# Patient Record
Sex: Male | Born: 1960 | Race: White | Hispanic: No | Marital: Married | Smoking: Former smoker
Health system: Southern US, Community
[De-identification: ages and names within clinical notes are randomized; demographics above are authoritative.]

## PROBLEM LIST (undated history)

## (undated) DIAGNOSIS — G894 Chronic pain syndrome: Secondary | ICD-10-CM

## (undated) DIAGNOSIS — G4730 Sleep apnea, unspecified: Secondary | ICD-10-CM

## (undated) DIAGNOSIS — L97509 Non-pressure chronic ulcer of other part of unspecified foot with unspecified severity: Secondary | ICD-10-CM

## (undated) DIAGNOSIS — M25519 Pain in unspecified shoulder: Secondary | ICD-10-CM

## (undated) DIAGNOSIS — K219 Gastro-esophageal reflux disease without esophagitis: Secondary | ICD-10-CM

## (undated) DIAGNOSIS — I679 Cerebrovascular disease, unspecified: Secondary | ICD-10-CM

## (undated) DIAGNOSIS — E7800 Pure hypercholesterolemia, unspecified: Secondary | ICD-10-CM

## (undated) DIAGNOSIS — L97524 Non-pressure chronic ulcer of other part of left foot with necrosis of bone: Secondary | ICD-10-CM

## (undated) DIAGNOSIS — E871 Hypo-osmolality and hyponatremia: Secondary | ICD-10-CM

## (undated) DIAGNOSIS — E872 Acidosis, unspecified: Secondary | ICD-10-CM

## (undated) DIAGNOSIS — D649 Anemia, unspecified: Secondary | ICD-10-CM

## (undated) DIAGNOSIS — I5041 Acute combined systolic (congestive) and diastolic (congestive) heart failure: Secondary | ICD-10-CM

## (undated) DIAGNOSIS — M75100 Unspecified rotator cuff tear or rupture of unspecified shoulder, not specified as traumatic: Secondary | ICD-10-CM

## (undated) DIAGNOSIS — I4891 Unspecified atrial fibrillation: Secondary | ICD-10-CM

## (undated) DIAGNOSIS — T8781 Dehiscence of amputation stump: Secondary | ICD-10-CM

## (undated) DIAGNOSIS — E291 Testicular hypofunction: Secondary | ICD-10-CM

## (undated) DIAGNOSIS — E785 Hyperlipidemia, unspecified: Secondary | ICD-10-CM

## (undated) HISTORY — PX: NO PAST SURGERIES: SHX2092

## (undated) HISTORY — DX: Hyperlipidemia, unspecified: E78.5

## (undated) HISTORY — DX: Gastro-esophageal reflux disease without esophagitis: K21.9

## (undated) HISTORY — DX: Unspecified rotator cuff tear or rupture of unspecified shoulder, not specified as traumatic: M75.100

## (undated) HISTORY — DX: Acute combined systolic (congestive) and diastolic (congestive) heart failure: I50.41

## (undated) HISTORY — DX: Pain in unspecified shoulder: M25.519

## (undated) HISTORY — DX: Pure hypercholesterolemia, unspecified: E78.00

## (undated) HISTORY — DX: Chronic pain syndrome: G89.4

## (undated) HISTORY — DX: Unspecified atrial fibrillation: I48.91

## (undated) HISTORY — DX: Cerebrovascular disease, unspecified: I67.9

## (undated) HISTORY — DX: Acidosis, unspecified: E87.20

## (undated) HISTORY — DX: Testicular hypofunction: E29.1

## (undated) HISTORY — DX: Dehiscence of amputation stump: T87.81

## (undated) HISTORY — DX: Sleep apnea, unspecified: G47.30

## (undated) HISTORY — DX: Hypo-osmolality and hyponatremia: E87.1

## (undated) HISTORY — DX: Anemia, unspecified: D64.9

## (undated) HISTORY — DX: Non-pressure chronic ulcer of other part of unspecified foot with unspecified severity: L97.509

## (undated) HISTORY — DX: Non-pressure chronic ulcer of other part of left foot with necrosis of bone: L97.524

---

## 2007-04-01 ENCOUNTER — Inpatient Hospital Stay (HOSPITAL_COMMUNITY): Admission: AD | Admit: 2007-04-01 | Discharge: 2007-04-03 | Payer: Self-pay | Admitting: Cardiovascular Disease

## 2007-04-01 ENCOUNTER — Ambulatory Visit: Payer: Self-pay | Admitting: Cardiovascular Disease

## 2007-05-03 ENCOUNTER — Ambulatory Visit: Payer: Self-pay | Admitting: Cardiovascular Disease

## 2009-07-12 ENCOUNTER — Telehealth (INDEPENDENT_AMBULATORY_CARE_PROVIDER_SITE_OTHER): Payer: Self-pay | Admitting: *Deleted

## 2010-01-14 ENCOUNTER — Inpatient Hospital Stay (HOSPITAL_COMMUNITY): Admission: AD | Admit: 2010-01-14 | Discharge: 2010-01-15 | Payer: Self-pay | Admitting: Cardiology

## 2010-01-14 ENCOUNTER — Ambulatory Visit: Payer: Self-pay | Admitting: Cardiology

## 2010-01-15 ENCOUNTER — Encounter: Payer: Self-pay | Admitting: Cardiology

## 2010-01-15 ENCOUNTER — Ambulatory Visit: Payer: Self-pay | Admitting: Internal Medicine

## 2010-02-11 DIAGNOSIS — F341 Dysthymic disorder: Secondary | ICD-10-CM

## 2010-02-11 DIAGNOSIS — R51 Headache: Secondary | ICD-10-CM

## 2010-02-11 DIAGNOSIS — E785 Hyperlipidemia, unspecified: Secondary | ICD-10-CM

## 2010-02-11 DIAGNOSIS — I10 Essential (primary) hypertension: Secondary | ICD-10-CM

## 2010-02-11 DIAGNOSIS — M549 Dorsalgia, unspecified: Secondary | ICD-10-CM | POA: Insufficient documentation

## 2010-02-11 DIAGNOSIS — E119 Type 2 diabetes mellitus without complications: Secondary | ICD-10-CM

## 2010-02-11 DIAGNOSIS — K222 Esophageal obstruction: Secondary | ICD-10-CM | POA: Insufficient documentation

## 2010-02-11 DIAGNOSIS — R519 Headache, unspecified: Secondary | ICD-10-CM | POA: Insufficient documentation

## 2010-02-11 DIAGNOSIS — R079 Chest pain, unspecified: Secondary | ICD-10-CM

## 2010-02-11 DIAGNOSIS — I2510 Atherosclerotic heart disease of native coronary artery without angina pectoris: Secondary | ICD-10-CM

## 2010-02-11 HISTORY — DX: Dysthymic disorder: F34.1

## 2010-02-11 HISTORY — DX: Headache: R51

## 2010-02-11 HISTORY — DX: Essential (primary) hypertension: I10

## 2010-02-11 HISTORY — DX: Atherosclerotic heart disease of native coronary artery without angina pectoris: I25.10

## 2010-02-11 HISTORY — DX: Dorsalgia, unspecified: M54.9

## 2010-02-11 HISTORY — DX: Esophageal obstruction: K22.2

## 2010-02-11 HISTORY — DX: Hyperlipidemia, unspecified: E78.5

## 2010-02-11 HISTORY — DX: Type 2 diabetes mellitus without complications: E11.9

## 2010-02-11 HISTORY — DX: Chest pain, unspecified: R07.9

## 2010-03-06 ENCOUNTER — Encounter (INDEPENDENT_AMBULATORY_CARE_PROVIDER_SITE_OTHER): Payer: Self-pay | Admitting: *Deleted

## 2010-11-27 NOTE — Procedures (Signed)
Summary: Upper Endoscopy  Patient: [name] Note: All result statuses are Final unless otherwise noted.  Tests: (1) Upper Endoscopy (EGD)   EGD Upper Endoscopy       DONE     [facility]     [address]     [place], [place]  [address]           ENDOSCOPY PROCEDURE REPORT           PATIENT:  Fernando Maddox, Fernando Maddox  MR#:  [id number]     BIRTHDATE:  1961/02/11, 48 yrs. old  GENDER:  male           ENDOSCOPIST:  Wilhemina Bonito. Eda Keys, MD     Referred by:  Arturo Morton. Riley Kill, M.D.           PROCEDURE DATE:  01/15/2010     PROCEDURE:  EGD with biopsy,     EGD with balloon dilatation -     15-16.5-18mm     ASA CLASS:  Class II     INDICATIONS:  dysphagia, dilation of esophageal stricture, GERD           MEDICATIONS:   Fentanyl 100 mcg IV, Versed 10 mg IV, Benadryl 25     mcg IV     TOPICAL ANESTHETIC:  Cetacaine Spray           DESCRIPTION OF PROCEDURE:   After the risks benefits and     alternatives of the procedure were thoroughly explained, informed     consent was obtained.  The EG-[id number] ([id number]) endoscope was     introduced through the mouth and advanced to the second portion of     the duodenum, without limitations.  The instrument was slowly     withdrawn as the mucosa was fully examined.     <<PROCEDUREIMAGES>>           Erosive Esophagitis was found in the distal esophagus. a benign     stricture 15mm present. Multiple tiny erosions were found in the     body and the antrum of the stomach.    Retroflexed views revealed     a hiatal hernia.  Otherwise normal exam. CLO Bx taken.  The scope     was then withdrawn from the patient and the procedure completed.           THERAPY: BALLOON DILATION VIA TTS W/ 15-16.5-18MM SEQUENTIALLY.     MIN RESISTANCE. NO HEME. TOLERATED WELL           COMPLICATIONS:  None           ENDOSCOPIC IMPRESSION:     1) Esophagitis in the distal esophagus     2) Erosions, multiple in the body and the antrum of the stomach     - S/P  CLO     3) A hiatal hernia     4) ESOPHAGEAL STRICTURE S/P DILATION           RECOMMENDATIONS:     1) PRILOSEC OTC 20MG  DAILY I MORNING 30 MIN PRIOR TO FIRST MEAL           2) Anti-reflux regimen to be follow           ______________________________     Wilhemina Bonito. Eda Keys, MD           CC:  Herby Abraham, MD, Brent Bulla, MD, The Patient           n.  eSIGNED:   Wilhemina Bonito. Eda Keys at 01/15/2010 05:08 PM           Pringle, Delmer, Kowalski [phone]  Note: An exclamation mark (!) indicates a result that was not dispersed into the flowsheet. Document Creation Date: 01/15/2010 5:09 PM _______________________________________________________________________  (1) Order result status: Final Collection or observation date-time: 01/15/2010 16:59 Requested date-time:  Receipt date-time:  Reported date-time:  Referring Physician:   Ordering Physician: Fransico Setters [phone]) Specimen Source:  Source: Launa Grill Order Number: [id number] Lab site:

## 2010-11-27 NOTE — Procedures (Signed)
Summary: [facility] Procedure Report   [facility] Procedure Report   Imported By: Roderic Ovens 05/24/2010 13:53:44  _____________________________________________________________________  External Attachment:    Type:   Image     Comment:   External Document

## 2010-11-27 NOTE — Letter (Signed)
Summary: Appointment - Missed  [facility], Main Office  [address]   [place], [place] [address]   Phone: [phone]  Fax: [phone]     Mar 06, 2010 MRN: [id number]   [facility] [address] [place], [place]  [address]   Dear Fernando Maddox,  Our records indicate you missed your appointment on 02/12/2010 with Dr. Excell Seltzer . It is very important that we reach you to reschedule this appointment. We look forward to participating in your health care needs. Please contact us at the number listed above at your earliest convenience to reschedule this appointment.     Sincerely, Neurosurgeon Team LG

## 2010-11-27 NOTE — Progress Notes (Signed)
  Recieved Request for Records from [facility]) will forward to [employer]. [facility]  July 12, 2009 8:27 AM

## 2011-01-19 LAB — LIPID PANEL
Cholesterol: 242 mg/dL — ABNORMAL HIGH (ref 0–200)
HDL: 35 mg/dL — ABNORMAL LOW (ref >39)
LDL Cholesterol: UNDETERMINED mg/dL (ref 0–99)
Total CHOL/HDL Ratio: 6.9 RATIO
Triglycerides: 490 mg/dL — ABNORMAL HIGH (ref <150)
VLDL: UNDETERMINED mg/dL (ref 0–40)

## 2011-01-19 LAB — POCT I-STAT, CHEM 8
BUN: 11 mg/dL (ref 6–23)
Calcium, Ion: 1.22 mmol/L (ref 1.12–1.32)
Chloride: 103 mEq/L (ref 96–112)
Creatinine, Ser: 0.8 mg/dL (ref 0.4–1.5)
Glucose, Bld: 134 mg/dL — ABNORMAL HIGH (ref 70–99)
HCT: 38 % — ABNORMAL LOW (ref 39.0–52.0)
Hemoglobin: 12.9 g/dL — ABNORMAL LOW (ref 13.0–17.0)
Potassium: 4 mEq/L (ref 3.5–5.1)
Sodium: 136 mEq/L (ref 135–145)
TCO2: 27 mmol/L (ref 0–100)

## 2011-01-19 LAB — CBC
HCT: 34.7 % — ABNORMAL LOW (ref 39.0–52.0)
Hemoglobin: 12.1 g/dL — ABNORMAL LOW (ref 13.0–17.0)
MCHC: 34.9 g/dL (ref 30.0–36.0)
MCV: 88.8 fL (ref 78.0–100.0)
Platelets: 186 10*3/uL (ref 150–400)
RBC: 3.91 MIL/uL — ABNORMAL LOW (ref 4.22–5.81)
RDW: 14.1 % (ref 11.5–15.5)
WBC: 6.5 10*3/uL (ref 4.0–10.5)

## 2011-01-19 LAB — HEMOGLOBIN A1C
Hgb A1c MFr Bld: 7.2 % — ABNORMAL HIGH (ref 4.6–6.1)
Mean Plasma Glucose: 160 mg/dL

## 2011-01-19 LAB — GLUCOSE, CAPILLARY
Glucose-Capillary: 119 mg/dL — ABNORMAL HIGH (ref 70–99)
Glucose-Capillary: 144 mg/dL — ABNORMAL HIGH (ref 70–99)
Glucose-Capillary: 208 mg/dL — ABNORMAL HIGH (ref 70–99)

## 2011-01-19 LAB — CLOTEST (H. PYLORI), BIOPSY: Helicobacter screen: NEGATIVE

## 2011-01-26 ENCOUNTER — Other Ambulatory Visit: Payer: Self-pay | Admitting: Adult Health

## 2011-02-04 ENCOUNTER — Other Ambulatory Visit: Payer: Self-pay | Admitting: Adult Health

## 2011-02-05 NOTE — Telephone Encounter (Signed)
[place] pt. 

## 2011-02-19 ENCOUNTER — Other Ambulatory Visit: Payer: Self-pay | Admitting: Adult Health

## 2011-02-19 NOTE — Telephone Encounter (Signed)
[address] pt. 

## 2011-03-11 NOTE — Letter (Signed)
April 01, 2007    Fernando Maddox, M.D.  [facility]  [address]  [place], [place] [address]   RE:  Fernando, Maddox  MRN:  [id number]  /  DOB:  April 14, 1961   Dear Dr. Marina Maddox:   It was my pleasure to see Fernando Maddox at the [facility] Cardiology Office  as an outpatient on April 01, 2007.  As you know, he is a 50 year old  gentleman presenting with a chief complaint of chest pain.   HISTORY OF PRESENT ILLNESS:  Fernando Maddox describes a several-month  history of chest pain, dyspnea, and weakness.  His symptoms really have  progressed over the last 1 month.  He describes a chest ache that  radiates to both arms with exertion.  He has also had some pain at rest,  but it is less intense.  He also describes giving out when he tries to  do yard work.  Over the last month, he has awakened several times at  night with chest discomfort.  He is able to do much less physical  activity than normal due to the symptoms described above.   He denies orthopnea, PND, or edema.  He has not had syncope, but does  complain of lightheadedness.  He also recently has noticed a rapid heart  rate at times.  He has no other specific complaints today, but he does  generally feel poor.  Of note, he describes taking multiple sublingual  nitroglycerin over the last several days.   PAST MEDICAL HISTORY:  Pertinent for the following:  1. Type 2 diabetes x4 years.  2. Essential hypertension.  3. Chronic low back pain since an injury 5 years ago.  4. Depression/anxiety.   PAST SURGICAL HISTORY:  None.   CURRENT MEDICATIONS:  Include:  1. Actos 30 mg daily.  2. Toprol XL 50 mg daily.  3. Librium 25 mg 3 times daily.  4. Aspirin 325 mg daily.   ALLERGIES:  NO KNOWN DRUG ALLERGIES.   His p.r.n. medicines include tramadol, sublingual nitroglycerin, and  cyclobenzaprine.   SOCIAL HISTORY:  The patient is married.  He has 5 children.  He is not  currently working due to his back injury.  He has used  tobacco for many  years, and uses smokeless tobacco, and has also smoked cigars and  cigarettes in the past, but is not currently smoking.  He does continue  to use smokeless tobacco.  He used to drink alcohol, but has recently  quit.  He smokes marijuana on occasion, but has not used drugs in a few  months.  He does not exercise regularly.   FAMILY HISTORY:  The patient's father had coronary stents placed at age  11.  He has a brother who has hypertriglyceridemia.  There is no history  of myocardial infarction in the family.   REVIEW OF SYSTEMS:  A complete 12-point review of systems was performed.  Pertinent positives include gastroesophageal reflux disease, erectile  dysfunction, urinary problems, anxiety, depression, headaches, and  fatigue.  All other systems were reviewed, and are negative except as  detailed in the history of present illness.   PHYSICAL EXAMINATION:  The patient is alert and oriented.  He is in no  acute distress.  He is a well-developed, well-nourished male.  His height is 6 feet 2 inches.  Weight is 271 pounds.  Blood pressure is  130/80 in the right arm, 128/80 in the left arm.  Heart rate is 104.  Respiratory  rate is 16.  HEENT:  Normal.  NECK:  Normal carotid upstrokes without bruits.  Jugular venous pressure  is normal.  No thyromegaly or thyroid nodules.  LUNGS:  Clear to auscultation bilaterally.  CARDIOVASCULAR:  The apex is discrete and nondisplaced.  There is no  right ventricular heave or lift.  The heart is regular rate and rhythm  without murmur or gallops.  ABDOMEN:  Soft and non-tender.  No organomegaly.  Normal bowel sounds.  No abdominal bruits.  BACK:  There is no flank tenderness.  EXTREMITIES:  No clubbing, cyanosis, or edema.  Peripheral pulses are 2+  and equal throughout.  There are no femoral arterial bruits.  SKIN:  Warm and dry without rash.  The skin is tan.  NEUROLOGIC:  Cranial nerves 2 through 12 are intact.  Strength is 5/5   and equal in the arms and legs bilaterally.  LYMPHATICS:  There is no adenopathy.   A 12-lead EKG shows sinus tachycardia with a ventricular rate of 104  beats per minute.  There are no ST segment or T wave changes.  Left axis  deviation is present.   ASSESSMENT:  Fernando Maddox is a 50 year old gentleman who has symptoms that  are highly concerning for unstable angina pectoris.  While he does not  have dynamic EKG changes, I am very concerned about his symptoms.  He  has clearly had progressive symptoms over the past month, and now is  symptomatic with light level activity with occasional rest symptoms.  I  think he requires hospital admission.  I am also concerned about his  resting tachycardia, which raises my level of suspicion for subacute  pulmonary embolus.   PLAN:  Admit directly to the hospital.  Give him aspirin and intravenous  heparin.  We will rule out pulmonary embolus with D-dimer and VQ scan.  I am choosing a VQ scan because I anticipate with a negative workup for  pulmonary embolus, that he will require a cardiac catheterization  tomorrow, and this will minimize is dye load over the next few days.  We  will continue his Toprol XL at present, and place on IV nitroglycerin if  he has recurrent chest pain.  As above, if his pulmonary embolus studies  are negative, would plan on diagnostic left heart catheterization  tomorrow.   Dr. Marina Maddox, thanks again for the opportunity to evaluate Fernando Maddox.  We  will also look in to his risk factors in detail during his  hospitalization, and check his lipids.  I certainly appreciate the  opportunity to see him.  We will be in contact with you as we come to a  diagnosis.    Sincerely,      Fernando Fells. Excell Seltzer, MD  Electronically Signed    MDC/MedQ  DD: 04/01/2007  DT: 04/01/2007  Job #: [id number]

## 2011-03-11 NOTE — Assessment & Plan Note (Signed)
[facility]                            CARDIOLOGY OFFICE NOTE   NAME:Routson, DESMEN SCHOFFSTALL                        MRN:          [id number]  DATE:05/03/2007                            DOB:          01/22/1961    Rae Halsted was seen in followup at the [facility] Cardiology office on  May 03, 2007.  He is a 50 year old gentleman who presented with typical  symptoms of unstable angina back in early June.  He was admitted from  the office and underwent a  catheterization the following day.  His  cardiac catheterization demonstrated severe stenosis of the left  circumflex and he underwent stenting of that vessel with a bare metal  stent.  Symptomatically he is improved since that time and his symptoms  of exertional chest pain radiating to his shoulders have completely  resolved.  He continues to have some intermittent non-exertional chest  pains that are dissimilar to his presenting symptoms.  He denies  dyspnea.  His main complaint at this point is that of headache.  He  complains of frontal headaches that have occurred on a daily basis since  he has been started on medical therapy for his coronary disease and  diabetes.   CURRENT MEDICATIONS:  Include:  1. Actos 30 mg daily.  2. Toprol XL 50 mg daily.  3. Aspirin 325 mg daily.  4. Plavix 75 mg daily.  5. Metformin 500 mg daily.  6. Lipitor 20 mg daily.  7. Cymbalta 60 mg daily.   ALLERGIES:  CODEINE   PHYSICAL EXAMINATION:  The patient is alert and oriented.  He is in no  acute distress.  Weight is 260 pounds.  Blood pressure is 116/80, heart rate 64,  respiratory rate 16.  HEENT:  Normal.  NECK:  Normal.  Carotid upstrokes without bruits.  Jugular venous  pressure is normal.  LUNGS:  Clear to auscultation bilaterally.  HEART:  Regular rate and rhythm without murmur or gallops.  ABDOMEN:  Soft, nontender, no organomegaly.  EXTREMITIES:  No clubbing, cyanosis or edema.  Peripheral pulses are 2+  and  equal throughout.   ASSESSMENT:  Mr. Mells is currently stable from a cardiovascular  standpoint.  His cardiac problems are as follows:  1. Coronary artery disease.  As above, he is now status post stenting      of the left circumflex for severe stenosis in that area.  He has      some residual moderate stenosis in an obtuse marginal branch in the      range of 70%, otherwise he has only moderate luminal      irregularities.  Medical therapy should be continued with aspirin      and Lipitor as well as Toprol for anti-ischemic therapy.  His      Plavix can be discontinued at the end of the month, as he was      treated with a bare metal stent.  His current chest pain is      atypical and I do not suspect that it is related to angina.  However, with his residual disease and a large obtuse marginal      branch, I think we should perform an exercise Myoview study at the      time of his return visit in 3 months.  This will also give [place] a      reassessment of his left ventricular function.  2. Moderate left ventricular dysfunction with an left ventricular      ejection fraction of 40%.  He had global hypokinesis of the left      ventricle, which may or may not be related to his coronary artery      disease.  He has a prior history of heavy alcohol use and this may      be the etiology of his cardiomyopathy.  We will reassess his left      ventricular function at the time of his nuclear stress study as      detailed.  3. Dyslipidemia, he has been appropriately started on the atorvastatin      and will be followed by Dr. Marina Goodell.  4. Headaches.  I am not sure of the etiology, but they may be related      to some of his medications, as he was currently on no medicine and      is now taking multiple medications in treatment of his diabetes and      coronary artery disease, as well as his dyslipidemia.  He will be      able to stop his Plavix and hopefully that will help his symptoms.      I  also encouraged him to stay well-hydrated.  If he continues to      have problems, he may require additional testing at the discretion      of Dr. Marina Goodell.   For followup, I will see Mr. Uhrich back in 3 months after his stress  study is completed.     Veverly Fells. Excell Seltzer, MD  Electronically Signed    MDC/MedQ  DD: 05/03/2007  DT: 05/04/2007  Job #: [id number]   cc:   Lyman Bishop, M.D. Marina Goodell

## 2011-03-11 NOTE — Cardiovascular Report (Signed)
Fernando Maddox, Fernando Maddox                 ACCOUNT NO.:  [id number]   MEDICAL RECORD NO.:  [id number]          PATIENT TYPE:  INP   LOCATION:  2001                         FACILITY:  [facility]   PHYSICIAN:  Veverly Fells. Excell Seltzer, MD  DATE OF BIRTH:  Dec 18, 1960   DATE OF PROCEDURE:  04/02/2007  DATE OF DISCHARGE:                            CARDIAC CATHETERIZATION   PROCEDURE:  Left heart catheterization, selective coronary angiography,  left ventricular angiography, PTCA and stenting of the left circumflex,  StarClose of the right femoral artery.   INDICATION:  Fernando Maddox is a 50 year old gentleman who was referred  yesterday by Dr. Marina Goodell in [place] after he presented with classic  symptoms of unstable angina.  I saw him in the clinic and elected to  admit him with his typical symptoms.  He had multiple risk factors.  We  ruled out a pulmonary embolus with a VQ scan and negative D-dimer and  referred him for cardiac catheterization today.   Risks and indications of the procedure were explained to the patient.  Informed consent was obtained.  Right groin was prepped, draped,  anesthetized with 1% lidocaine.  Using modified Seldinger technique, a 6-  French sheath was placed in the right femoral artery.  Standard  catheters were used for the diagnostic procedure and multiple views of  the left and right coronary arteries were taken.  An angled pigtail  catheter was inserted in the left ventricle and pressures were recorded.  Left ventriculogram was performed.  Pullback across the aortic valve was  done.   At the conclusion of the diagnostic procedure, we elected to intervene  on the left circumflex.  There was high-grade focal stenosis of 90% in  the midcircumflex just before the branch point of the second obtuse  marginal branch and the midcircumflex.  The circumflex is dominant.  Angiomax was used for anticoagulation.  The patient was given 600 mg of  clopidogrel on the table and once  therapeutic ACT was achieved, an XB  4.0 cm guide catheter was inserted.  A cougar guidewire was passed into  the distal circumflex and the lesion was predilated with a 2.5 x 15 mm  Maverick balloon to 10 atmospheres.  Following predilatation, a 4 x 15  mm Vision stent was placed and the distal portion of the stent was  positioned just at the bifurcation.  The stent was deployed at 12  atmospheres.  There was excellent stent expansion with TIMI III flow.  I  elected to postdilate the stent with 4.0 x 12 mm Quantum Maverick  balloon which was inflated to 18 atmospheres.  Following postdilatation,  there was excellent stent expansion and an excellent angiographic result  with TIMI III flow in all branches.  The obtuse marginal has moderate  disease in its midportion but I elected to leave that alone as it does  not appear to be the culprit.  We will continue with aggressive medical  therapy.  The right femoral arteriotomy was closed with a StarClose  device.   FINDINGS:  Aortic pressure 127/82 with a mean of 105.  Left  ventricular  pressure 125/21.   The left mainstem is angiographically normal.  It bifurcates into the  LAD and left circumflex.  The LAD is a large-caliber vessel that courses  down and just reaches the LV apex.  There is a large-sized diagonal  branch from the midportion as well as a large septal perforator branch  that arises just beyond the diagonal.  There is no significant  angiographic disease in the LAD.   The first OM branch of the left circumflex has an early takeoff and it  has 40% tubular area of stenosis in its proximal to midportion.  The  remaining portions of that vessel are free of any significant disease.  The circumflex in its proximal segments is a very large vessel that has  no significant angiographic disease and the midportion of the vessel has  a 90% eccentric stenosis just before the branch point of the second OM  and true circumflex.  The second  OM branch has 70-75% stenosis in its  midportion.  It is a large vessel that wraps around and supplies a large  area of the posterolateral wall.  The AV groove circumflex courses down  and provides a left PDA as well as a left posterolateral branch.  There  is no further angiographic disease in the left circumflex.   The right coronary artery is nondominant, supplies a large RV marginal  branch, there is no significant angiographic disease.   Left ventricular function assessed with 30 degrees RAO left  ventriculography shows moderate global LV systolic dysfunction with an  overall EF of 40%.  There is no significant mitral regurgitation.   ASSESSMENT:  1. Severe single-vessel coronary artery disease involving the left      circumflex.  2. Moderate left ventricular dysfunction, also with elevated left      ventricular filling pressures.  3. Successful percutaneous coronary intervention of the left      circumflex with a bare metal stent.   PLAN:  As detailed above successful PCI with bare metal stent to the mid  left circumflex was performed.  The patient will require ongoing medical  therapy.  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  We elected to treat his OM branch medically  which I think is reasonable and if he has further angina, we could  consider intervening on that but I suspect he will gain great  symptomatic benefit from treating his high-grade midcircumflex stenosis.      Veverly Fells. Excell Seltzer, MD  Electronically Signed     MDC/MEDQ  D:  04/02/2007  T:  04/02/2007  Job:  [id number]   cc:   Brent Bulla, MD

## 2011-03-11 NOTE — Discharge Summary (Signed)
Fernando Maddox, Fernando Maddox                 ACCOUNT NO.:  [id number]   MEDICAL RECORD NO.:  [id number]          PATIENT TYPE:  INP   LOCATION:  6525                         FACILITY:  [facility]   PHYSICIAN:  Noralyn Pick. Eden Emms, MD, FACCDATE OF BIRTH:  [birth year]   DATE OF ADMISSION:  04/01/2007  DATE OF DISCHARGE:  04/03/2007                               DISCHARGE SUMMARY   Discharging physician is Dr. Maurine Cane. Primary cardiologist will be  Dr. Tonny Bollman. Primary care is Brent Bulla at [facility] in [place], [place]. Fax [phone].   DISCHARGE DIAGNOSIS:  1. Coronary artery disease status, post cardiac catheterization this      admission by Dr. Calton Dach on 04/02/2007. The patient with severe      single vessel coronary artery disease, moderate LV dysfunction.      Suspect mixed etiology. Status post successful PCI of left      circumflex with bare metal stent with recommendations for lifelong      aspirin and Plavix x1 month. Recommended adding ACE inhibitor.      Continue Toprol and statin.  However, the patient is not currently      on a statin and an ACE inhibitor has not been added at time of      discharge. The patient to follow up with Dr. Excell Seltzer in the office      setting for initiation of ACE inhibitor and statin in the setting      of questionable history of EtOH use.  2. Ongoing tobacco use in the form of dip.  3. Diabetes poor control.  4. Episodes of urine incontinence this admission. The patient to      follow up with Dr. Marina Goodell outpatient for further evaluation as      family suspects this is related to his diabetes.  5. Questionable obstructive sleep apnea.  The patient to follow up      with Dr. Marina Goodell regarding this.  6. Depression.  7. Hypertension.   HOSPITAL COURSE:  Fernando Maddox is a 50 year old gentleman followed by  primary care in [place], [place] who was referred to [place] by Dr.  Marina Goodell. After he presented with classic symptoms of  unstable angina Dr.  Excell Seltzer saw the patient in the clinic and elected to admit him with his  typical symptoms in the setting of multiple risk factors. The patient  ruled out for a PE with a VQ scan and was arranged for cardiac  catheterization which took place on 04/02/2007, results as stated above.  The patient tolerated the procedure without complications.  Dr. Excell Seltzer  suspected the patient's LV dysfunction was mixed etiology as he had very  heavy alcohol use in the past. 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 Dr. Eden Emms in to see the patient on day of discharge.  Cath site stable. Patient being discharged home to follow up with Dr.  Excell Seltzer within the next 3 to 4 weeks.  The patient to call office and  arrange appointment. Also I have left a message with the office to call  the  patient at home to make sure appointment is scheduled. At time of  discharge the patient has been given the post cardiac catheterization  discharge instructions.   MEDICATIONS:  Include:  1. Aspirin 325.  2. Librium 25 mg t.i.d. or as previously prescribed.  3. Toprol XL 50 mg daily.  4. Actos 30 mg daily.  5. Plavix 75 mg daily.  6. Nitroglycerin p.r.n. use.   Duration of discharge encounter 40 minutes.      Dorian Pod, ACNP      Noralyn Pick. Eden Emms, MD, [facility]  Electronically Signed    MB/MEDQ  D:  04/03/2007  T:  04/04/2007  Job:  [id number]   cc:   Kinnie Scales [phone], M.D.

## 2011-03-12 ENCOUNTER — Other Ambulatory Visit: Payer: Self-pay | Admitting: Adult Health

## 2011-03-12 NOTE — Telephone Encounter (Signed)
[place] pt. 

## 2011-08-14 LAB — COMPREHENSIVE METABOLIC PANEL
ALT: 42
AST: 29
Albumin: 4.4
Alkaline Phosphatase: 52
BUN: 15
CO2: 27
Calcium: 9.9
Chloride: 99
Creatinine, Ser: 0.91
GFR calc Af Amer: >60
GFR calc non Af Amer: >60
Glucose, Bld: 254 — ABNORMAL HIGH
Potassium: 4.3
Sodium: 136
Total Bilirubin: 0.6
Total Protein: 6.9

## 2011-08-14 LAB — CARDIAC PANEL(CRET KIN+CKTOT+MB+TROPI)
CK, MB: 2.4
CK, MB: 2.5
CK, MB: 3.5
CK, MB: 4.7 — ABNORMAL HIGH
Relative Index: 1.1
Relative Index: 1.2
Relative Index: 1.2
Relative Index: 2.9 — ABNORMAL HIGH
Total CK: 160
Total CK: 206
Total CK: 232
Total CK: 287 — ABNORMAL HIGH
Troponin I: 0.02
Troponin I: 0.03
Troponin I: 0.03
Troponin I: 0.22 — ABNORMAL HIGH

## 2011-08-14 LAB — CBC
HCT: 36.7 — ABNORMAL LOW
HCT: 39.4
HCT: 40.2
Hemoglobin: 12.7 — ABNORMAL LOW
Hemoglobin: 13.2
Hemoglobin: 13.8
MCHC: 33.6
MCHC: 34.5
MCHC: 34.5
MCV: 84.9
MCV: 86.2
MCV: 87.4
Platelets: 233
Platelets: 242
Platelets: 261
RBC: 4.26
RBC: 4.51
RBC: 4.73
RDW: 13
RDW: 13.1
RDW: 13.3
WBC: 6.2
WBC: 6.4
WBC: 6.8

## 2011-08-14 LAB — BASIC METABOLIC PANEL
BUN: 8
CO2: 28
Calcium: 9
Chloride: 104
Creatinine, Ser: 0.86
GFR calc Af Amer: >60
GFR calc non Af Amer: >60
Glucose, Bld: 176 — ABNORMAL HIGH
Potassium: 4.1
Sodium: 137

## 2011-08-14 LAB — PROTIME-INR
INR: 0.9
Prothrombin Time: 12.4

## 2011-08-14 LAB — LIPID PANEL
Cholesterol: 201 — ABNORMAL HIGH
HDL: 25 — ABNORMAL LOW
LDL Cholesterol: UNDETERMINED
Total CHOL/HDL Ratio: 8
Triglycerides: 996 — ABNORMAL HIGH
VLDL: UNDETERMINED

## 2011-08-14 LAB — APTT: aPTT: 27

## 2011-08-14 LAB — HEPARIN LEVEL (UNFRACTIONATED)
Heparin Unfractionated: 0.23 — ABNORMAL LOW
Heparin Unfractionated: <0.1 — ABNORMAL LOW

## 2011-08-14 LAB — D-DIMER, QUANTITATIVE (NOT AT ARMC): D-Dimer, Quant: 0.23

## 2013-05-30 DIAGNOSIS — Z79899 Other long term (current) drug therapy: Secondary | ICD-10-CM | POA: Diagnosis not present

## 2013-07-27 DIAGNOSIS — E119 Type 2 diabetes mellitus without complications: Secondary | ICD-10-CM | POA: Diagnosis not present

## 2013-07-27 DIAGNOSIS — S82209A Unspecified fracture of shaft of unspecified tibia, initial encounter for closed fracture: Secondary | ICD-10-CM | POA: Diagnosis not present

## 2013-07-27 DIAGNOSIS — E785 Hyperlipidemia, unspecified: Secondary | ICD-10-CM | POA: Diagnosis not present

## 2013-07-27 DIAGNOSIS — M6281 Muscle weakness (generalized): Secondary | ICD-10-CM | POA: Diagnosis not present

## 2013-07-27 DIAGNOSIS — I10 Essential (primary) hypertension: Secondary | ICD-10-CM | POA: Diagnosis not present

## 2013-07-27 DIAGNOSIS — S82899A Other fracture of unspecified lower leg, initial encounter for closed fracture: Secondary | ICD-10-CM | POA: Diagnosis not present

## 2013-07-27 DIAGNOSIS — Z5189 Encounter for other specified aftercare: Secondary | ICD-10-CM | POA: Diagnosis not present

## 2013-07-27 DIAGNOSIS — S82209B Unspecified fracture of shaft of unspecified tibia, initial encounter for open fracture type I or II: Secondary | ICD-10-CM | POA: Diagnosis not present

## 2013-07-27 DIAGNOSIS — R279 Unspecified lack of coordination: Secondary | ICD-10-CM | POA: Diagnosis not present

## 2013-08-10 DIAGNOSIS — S82209A Unspecified fracture of shaft of unspecified tibia, initial encounter for closed fracture: Secondary | ICD-10-CM | POA: Diagnosis not present

## 2013-09-10 DIAGNOSIS — F17200 Nicotine dependence, unspecified, uncomplicated: Secondary | ICD-10-CM | POA: Diagnosis not present

## 2013-09-10 DIAGNOSIS — F329 Major depressive disorder, single episode, unspecified: Secondary | ICD-10-CM | POA: Diagnosis not present

## 2013-09-10 DIAGNOSIS — I2510 Atherosclerotic heart disease of native coronary artery without angina pectoris: Secondary | ICD-10-CM | POA: Diagnosis not present

## 2013-09-10 DIAGNOSIS — E119 Type 2 diabetes mellitus without complications: Secondary | ICD-10-CM | POA: Diagnosis not present

## 2013-09-10 DIAGNOSIS — S8290XD Unspecified fracture of unspecified lower leg, subsequent encounter for closed fracture with routine healing: Secondary | ICD-10-CM | POA: Diagnosis not present

## 2013-09-10 DIAGNOSIS — IMO0002 Reserved for concepts with insufficient information to code with codable children: Secondary | ICD-10-CM | POA: Diagnosis not present

## 2013-09-12 DIAGNOSIS — S8290XD Unspecified fracture of unspecified lower leg, subsequent encounter for closed fracture with routine healing: Secondary | ICD-10-CM | POA: Diagnosis not present

## 2013-09-12 DIAGNOSIS — F329 Major depressive disorder, single episode, unspecified: Secondary | ICD-10-CM | POA: Diagnosis not present

## 2013-09-12 DIAGNOSIS — E119 Type 2 diabetes mellitus without complications: Secondary | ICD-10-CM | POA: Diagnosis not present

## 2013-09-12 DIAGNOSIS — IMO0002 Reserved for concepts with insufficient information to code with codable children: Secondary | ICD-10-CM | POA: Diagnosis not present

## 2013-09-12 DIAGNOSIS — F17200 Nicotine dependence, unspecified, uncomplicated: Secondary | ICD-10-CM | POA: Diagnosis not present

## 2013-09-12 DIAGNOSIS — I2510 Atherosclerotic heart disease of native coronary artery without angina pectoris: Secondary | ICD-10-CM | POA: Diagnosis not present

## 2013-09-13 DIAGNOSIS — F329 Major depressive disorder, single episode, unspecified: Secondary | ICD-10-CM | POA: Diagnosis not present

## 2013-09-13 DIAGNOSIS — I2510 Atherosclerotic heart disease of native coronary artery without angina pectoris: Secondary | ICD-10-CM | POA: Diagnosis not present

## 2013-09-13 DIAGNOSIS — F17200 Nicotine dependence, unspecified, uncomplicated: Secondary | ICD-10-CM | POA: Diagnosis not present

## 2013-09-13 DIAGNOSIS — IMO0002 Reserved for concepts with insufficient information to code with codable children: Secondary | ICD-10-CM | POA: Diagnosis not present

## 2013-09-13 DIAGNOSIS — E119 Type 2 diabetes mellitus without complications: Secondary | ICD-10-CM | POA: Diagnosis not present

## 2013-09-13 DIAGNOSIS — S8290XD Unspecified fracture of unspecified lower leg, subsequent encounter for closed fracture with routine healing: Secondary | ICD-10-CM | POA: Diagnosis not present

## 2013-09-15 DIAGNOSIS — E119 Type 2 diabetes mellitus without complications: Secondary | ICD-10-CM | POA: Diagnosis not present

## 2013-09-15 DIAGNOSIS — I2510 Atherosclerotic heart disease of native coronary artery without angina pectoris: Secondary | ICD-10-CM | POA: Diagnosis not present

## 2013-09-15 DIAGNOSIS — F329 Major depressive disorder, single episode, unspecified: Secondary | ICD-10-CM | POA: Diagnosis not present

## 2013-09-15 DIAGNOSIS — F17200 Nicotine dependence, unspecified, uncomplicated: Secondary | ICD-10-CM | POA: Diagnosis not present

## 2013-09-15 DIAGNOSIS — S8290XD Unspecified fracture of unspecified lower leg, subsequent encounter for closed fracture with routine healing: Secondary | ICD-10-CM | POA: Diagnosis not present

## 2013-09-15 DIAGNOSIS — IMO0002 Reserved for concepts with insufficient information to code with codable children: Secondary | ICD-10-CM | POA: Diagnosis not present

## 2013-09-19 DIAGNOSIS — G894 Chronic pain syndrome: Secondary | ICD-10-CM | POA: Diagnosis not present

## 2013-09-20 DIAGNOSIS — S8290XD Unspecified fracture of unspecified lower leg, subsequent encounter for closed fracture with routine healing: Secondary | ICD-10-CM | POA: Diagnosis not present

## 2013-09-20 DIAGNOSIS — F329 Major depressive disorder, single episode, unspecified: Secondary | ICD-10-CM | POA: Diagnosis not present

## 2013-09-20 DIAGNOSIS — I2510 Atherosclerotic heart disease of native coronary artery without angina pectoris: Secondary | ICD-10-CM | POA: Diagnosis not present

## 2013-09-20 DIAGNOSIS — IMO0002 Reserved for concepts with insufficient information to code with codable children: Secondary | ICD-10-CM | POA: Diagnosis not present

## 2013-09-20 DIAGNOSIS — E119 Type 2 diabetes mellitus without complications: Secondary | ICD-10-CM | POA: Diagnosis not present

## 2013-09-20 DIAGNOSIS — F17200 Nicotine dependence, unspecified, uncomplicated: Secondary | ICD-10-CM | POA: Diagnosis not present

## 2013-09-21 DIAGNOSIS — M259 Joint disorder, unspecified: Secondary | ICD-10-CM | POA: Diagnosis not present

## 2013-09-21 DIAGNOSIS — M25519 Pain in unspecified shoulder: Secondary | ICD-10-CM | POA: Diagnosis not present

## 2013-09-26 DIAGNOSIS — M25519 Pain in unspecified shoulder: Secondary | ICD-10-CM | POA: Diagnosis not present

## 2013-09-27 DIAGNOSIS — E119 Type 2 diabetes mellitus without complications: Secondary | ICD-10-CM | POA: Diagnosis not present

## 2013-09-27 DIAGNOSIS — IMO0002 Reserved for concepts with insufficient information to code with codable children: Secondary | ICD-10-CM | POA: Diagnosis not present

## 2013-09-27 DIAGNOSIS — I2510 Atherosclerotic heart disease of native coronary artery without angina pectoris: Secondary | ICD-10-CM | POA: Diagnosis not present

## 2013-09-27 DIAGNOSIS — F329 Major depressive disorder, single episode, unspecified: Secondary | ICD-10-CM | POA: Diagnosis not present

## 2013-09-27 DIAGNOSIS — F17200 Nicotine dependence, unspecified, uncomplicated: Secondary | ICD-10-CM | POA: Diagnosis not present

## 2013-09-27 DIAGNOSIS — S8290XD Unspecified fracture of unspecified lower leg, subsequent encounter for closed fracture with routine healing: Secondary | ICD-10-CM | POA: Diagnosis not present

## 2013-09-29 DIAGNOSIS — S8290XD Unspecified fracture of unspecified lower leg, subsequent encounter for closed fracture with routine healing: Secondary | ICD-10-CM | POA: Diagnosis not present

## 2013-09-29 DIAGNOSIS — E119 Type 2 diabetes mellitus without complications: Secondary | ICD-10-CM | POA: Diagnosis not present

## 2013-09-29 DIAGNOSIS — I2510 Atherosclerotic heart disease of native coronary artery without angina pectoris: Secondary | ICD-10-CM | POA: Diagnosis not present

## 2013-09-29 DIAGNOSIS — F329 Major depressive disorder, single episode, unspecified: Secondary | ICD-10-CM | POA: Diagnosis not present

## 2013-09-29 DIAGNOSIS — IMO0002 Reserved for concepts with insufficient information to code with codable children: Secondary | ICD-10-CM | POA: Diagnosis not present

## 2013-09-29 DIAGNOSIS — F17200 Nicotine dependence, unspecified, uncomplicated: Secondary | ICD-10-CM | POA: Diagnosis not present

## 2013-10-06 DIAGNOSIS — S8290XD Unspecified fracture of unspecified lower leg, subsequent encounter for closed fracture with routine healing: Secondary | ICD-10-CM | POA: Diagnosis not present

## 2013-10-06 DIAGNOSIS — F329 Major depressive disorder, single episode, unspecified: Secondary | ICD-10-CM | POA: Diagnosis not present

## 2013-10-06 DIAGNOSIS — IMO0002 Reserved for concepts with insufficient information to code with codable children: Secondary | ICD-10-CM | POA: Diagnosis not present

## 2013-10-06 DIAGNOSIS — F17200 Nicotine dependence, unspecified, uncomplicated: Secondary | ICD-10-CM | POA: Diagnosis not present

## 2013-10-06 DIAGNOSIS — E119 Type 2 diabetes mellitus without complications: Secondary | ICD-10-CM | POA: Diagnosis not present

## 2013-10-06 DIAGNOSIS — I2510 Atherosclerotic heart disease of native coronary artery without angina pectoris: Secondary | ICD-10-CM | POA: Diagnosis not present

## 2013-10-12 DIAGNOSIS — E119 Type 2 diabetes mellitus without complications: Secondary | ICD-10-CM | POA: Diagnosis not present

## 2013-10-12 DIAGNOSIS — I2510 Atherosclerotic heart disease of native coronary artery without angina pectoris: Secondary | ICD-10-CM | POA: Diagnosis not present

## 2013-10-12 DIAGNOSIS — S8290XD Unspecified fracture of unspecified lower leg, subsequent encounter for closed fracture with routine healing: Secondary | ICD-10-CM | POA: Diagnosis not present

## 2013-10-12 DIAGNOSIS — IMO0002 Reserved for concepts with insufficient information to code with codable children: Secondary | ICD-10-CM | POA: Diagnosis not present

## 2013-10-12 DIAGNOSIS — F329 Major depressive disorder, single episode, unspecified: Secondary | ICD-10-CM | POA: Diagnosis not present

## 2013-10-12 DIAGNOSIS — F17200 Nicotine dependence, unspecified, uncomplicated: Secondary | ICD-10-CM | POA: Diagnosis not present

## 2013-10-13 DIAGNOSIS — Z79899 Other long term (current) drug therapy: Secondary | ICD-10-CM | POA: Diagnosis not present

## 2013-10-13 DIAGNOSIS — G894 Chronic pain syndrome: Secondary | ICD-10-CM | POA: Diagnosis not present

## 2013-10-19 DIAGNOSIS — F329 Major depressive disorder, single episode, unspecified: Secondary | ICD-10-CM | POA: Diagnosis not present

## 2013-10-19 DIAGNOSIS — S8290XD Unspecified fracture of unspecified lower leg, subsequent encounter for closed fracture with routine healing: Secondary | ICD-10-CM | POA: Diagnosis not present

## 2013-10-19 DIAGNOSIS — IMO0002 Reserved for concepts with insufficient information to code with codable children: Secondary | ICD-10-CM | POA: Diagnosis not present

## 2013-10-19 DIAGNOSIS — F17200 Nicotine dependence, unspecified, uncomplicated: Secondary | ICD-10-CM | POA: Diagnosis not present

## 2013-10-19 DIAGNOSIS — I2510 Atherosclerotic heart disease of native coronary artery without angina pectoris: Secondary | ICD-10-CM | POA: Diagnosis not present

## 2013-10-19 DIAGNOSIS — E119 Type 2 diabetes mellitus without complications: Secondary | ICD-10-CM | POA: Diagnosis not present

## 2013-11-02 DIAGNOSIS — S82209A Unspecified fracture of shaft of unspecified tibia, initial encounter for closed fracture: Secondary | ICD-10-CM | POA: Diagnosis not present

## 2013-11-02 DIAGNOSIS — M25519 Pain in unspecified shoulder: Secondary | ICD-10-CM | POA: Diagnosis not present

## 2013-11-04 DIAGNOSIS — F17200 Nicotine dependence, unspecified, uncomplicated: Secondary | ICD-10-CM | POA: Diagnosis not present

## 2013-11-04 DIAGNOSIS — F329 Major depressive disorder, single episode, unspecified: Secondary | ICD-10-CM | POA: Diagnosis not present

## 2013-11-04 DIAGNOSIS — E119 Type 2 diabetes mellitus without complications: Secondary | ICD-10-CM | POA: Diagnosis not present

## 2013-11-04 DIAGNOSIS — F3289 Other specified depressive episodes: Secondary | ICD-10-CM | POA: Diagnosis not present

## 2013-11-04 DIAGNOSIS — I2510 Atherosclerotic heart disease of native coronary artery without angina pectoris: Secondary | ICD-10-CM | POA: Diagnosis not present

## 2013-11-04 DIAGNOSIS — IMO0002 Reserved for concepts with insufficient information to code with codable children: Secondary | ICD-10-CM | POA: Diagnosis not present

## 2013-11-04 DIAGNOSIS — S8290XD Unspecified fracture of unspecified lower leg, subsequent encounter for closed fracture with routine healing: Secondary | ICD-10-CM | POA: Diagnosis not present

## 2013-11-09 DIAGNOSIS — F17200 Nicotine dependence, unspecified, uncomplicated: Secondary | ICD-10-CM | POA: Diagnosis not present

## 2013-11-09 DIAGNOSIS — I2510 Atherosclerotic heart disease of native coronary artery without angina pectoris: Secondary | ICD-10-CM | POA: Diagnosis not present

## 2013-11-09 DIAGNOSIS — F329 Major depressive disorder, single episode, unspecified: Secondary | ICD-10-CM | POA: Diagnosis not present

## 2013-11-09 DIAGNOSIS — E119 Type 2 diabetes mellitus without complications: Secondary | ICD-10-CM | POA: Diagnosis not present

## 2013-11-09 DIAGNOSIS — F3289 Other specified depressive episodes: Secondary | ICD-10-CM | POA: Diagnosis not present

## 2013-11-14 DIAGNOSIS — Z79899 Other long term (current) drug therapy: Secondary | ICD-10-CM | POA: Diagnosis not present

## 2013-11-14 DIAGNOSIS — G894 Chronic pain syndrome: Secondary | ICD-10-CM | POA: Diagnosis not present

## 2013-12-15 DIAGNOSIS — R079 Chest pain, unspecified: Secondary | ICD-10-CM | POA: Diagnosis not present

## 2013-12-15 DIAGNOSIS — R197 Diarrhea, unspecified: Secondary | ICD-10-CM | POA: Diagnosis not present

## 2013-12-15 DIAGNOSIS — E785 Hyperlipidemia, unspecified: Secondary | ICD-10-CM | POA: Diagnosis not present

## 2013-12-15 DIAGNOSIS — I10 Essential (primary) hypertension: Secondary | ICD-10-CM | POA: Diagnosis not present

## 2013-12-15 DIAGNOSIS — G894 Chronic pain syndrome: Secondary | ICD-10-CM | POA: Diagnosis not present

## 2013-12-15 DIAGNOSIS — IMO0001 Reserved for inherently not codable concepts without codable children: Secondary | ICD-10-CM | POA: Diagnosis not present

## 2013-12-26 DIAGNOSIS — R197 Diarrhea, unspecified: Secondary | ICD-10-CM | POA: Diagnosis not present

## 2014-01-11 DIAGNOSIS — M25569 Pain in unspecified knee: Secondary | ICD-10-CM | POA: Diagnosis not present

## 2014-03-15 DIAGNOSIS — G894 Chronic pain syndrome: Secondary | ICD-10-CM | POA: Diagnosis not present

## 2014-03-15 DIAGNOSIS — Z79899 Other long term (current) drug therapy: Secondary | ICD-10-CM | POA: Diagnosis not present

## 2014-03-29 DIAGNOSIS — M799 Soft tissue disorder, unspecified: Secondary | ICD-10-CM | POA: Diagnosis not present

## 2014-03-29 DIAGNOSIS — M25519 Pain in unspecified shoulder: Secondary | ICD-10-CM | POA: Diagnosis not present

## 2014-03-29 DIAGNOSIS — M25539 Pain in unspecified wrist: Secondary | ICD-10-CM | POA: Diagnosis not present

## 2014-05-08 DIAGNOSIS — M159 Polyosteoarthritis, unspecified: Secondary | ICD-10-CM | POA: Diagnosis not present

## 2014-05-08 DIAGNOSIS — M25569 Pain in unspecified knee: Secondary | ICD-10-CM | POA: Diagnosis not present

## 2014-05-08 DIAGNOSIS — Z79899 Other long term (current) drug therapy: Secondary | ICD-10-CM | POA: Diagnosis not present

## 2014-05-08 DIAGNOSIS — G8929 Other chronic pain: Secondary | ICD-10-CM | POA: Diagnosis not present

## 2014-05-08 DIAGNOSIS — M25539 Pain in unspecified wrist: Secondary | ICD-10-CM | POA: Diagnosis not present

## 2014-05-08 DIAGNOSIS — M25519 Pain in unspecified shoulder: Secondary | ICD-10-CM | POA: Diagnosis not present

## 2014-06-21 DIAGNOSIS — IMO0001 Reserved for inherently not codable concepts without codable children: Secondary | ICD-10-CM | POA: Diagnosis not present

## 2014-06-21 DIAGNOSIS — J209 Acute bronchitis, unspecified: Secondary | ICD-10-CM | POA: Diagnosis not present

## 2014-06-21 DIAGNOSIS — G894 Chronic pain syndrome: Secondary | ICD-10-CM | POA: Diagnosis not present

## 2014-09-25 DIAGNOSIS — G894 Chronic pain syndrome: Secondary | ICD-10-CM | POA: Diagnosis not present

## 2014-09-25 DIAGNOSIS — E119 Type 2 diabetes mellitus without complications: Secondary | ICD-10-CM | POA: Diagnosis not present

## 2015-10-31 DIAGNOSIS — G894 Chronic pain syndrome: Secondary | ICD-10-CM | POA: Diagnosis not present

## 2015-10-31 DIAGNOSIS — Z79899 Other long term (current) drug therapy: Secondary | ICD-10-CM | POA: Diagnosis not present

## 2015-10-31 DIAGNOSIS — E1140 Type 2 diabetes mellitus with diabetic neuropathy, unspecified: Secondary | ICD-10-CM | POA: Diagnosis not present

## 2016-08-04 DIAGNOSIS — Z7901 Long term (current) use of anticoagulants: Secondary | ICD-10-CM | POA: Diagnosis not present

## 2016-08-04 DIAGNOSIS — F17210 Nicotine dependence, cigarettes, uncomplicated: Secondary | ICD-10-CM | POA: Diagnosis not present

## 2016-08-04 DIAGNOSIS — J9691 Respiratory failure, unspecified with hypoxia: Secondary | ICD-10-CM

## 2016-08-04 DIAGNOSIS — E784 Other hyperlipidemia: Secondary | ICD-10-CM | POA: Diagnosis not present

## 2016-08-04 DIAGNOSIS — R748 Abnormal levels of other serum enzymes: Secondary | ICD-10-CM | POA: Diagnosis not present

## 2016-08-04 DIAGNOSIS — I214 Non-ST elevation (NSTEMI) myocardial infarction: Secondary | ICD-10-CM | POA: Diagnosis not present

## 2016-08-04 DIAGNOSIS — J189 Pneumonia, unspecified organism: Secondary | ICD-10-CM | POA: Diagnosis not present

## 2016-08-04 DIAGNOSIS — I2510 Atherosclerotic heart disease of native coronary artery without angina pectoris: Secondary | ICD-10-CM | POA: Diagnosis not present

## 2016-08-04 DIAGNOSIS — Z9119 Patient's noncompliance with other medical treatment and regimen: Secondary | ICD-10-CM | POA: Diagnosis not present

## 2016-08-04 DIAGNOSIS — R0602 Shortness of breath: Secondary | ICD-10-CM | POA: Diagnosis not present

## 2016-08-04 DIAGNOSIS — Z955 Presence of coronary angioplasty implant and graft: Secondary | ICD-10-CM | POA: Diagnosis not present

## 2016-08-04 DIAGNOSIS — R531 Weakness: Secondary | ICD-10-CM | POA: Diagnosis not present

## 2016-08-04 DIAGNOSIS — E1165 Type 2 diabetes mellitus with hyperglycemia: Secondary | ICD-10-CM

## 2016-08-04 DIAGNOSIS — F1990 Other psychoactive substance use, unspecified, uncomplicated: Secondary | ICD-10-CM

## 2016-08-04 DIAGNOSIS — I4891 Unspecified atrial fibrillation: Secondary | ICD-10-CM | POA: Diagnosis not present

## 2016-08-04 DIAGNOSIS — F17200 Nicotine dependence, unspecified, uncomplicated: Secondary | ICD-10-CM

## 2016-08-04 DIAGNOSIS — D735 Infarction of spleen: Secondary | ICD-10-CM

## 2016-08-04 DIAGNOSIS — R05 Cough: Secondary | ICD-10-CM | POA: Diagnosis not present

## 2016-08-05 ENCOUNTER — Inpatient Hospital Stay (HOSPITAL_COMMUNITY)
Admission: AD | Admit: 2016-08-05 | Discharge: 2016-08-05 | DRG: 068 | Disposition: A | Discharge status: Other Acute Inpatient Hospital | Payer: Medicare Other | Source: Other Acute Inpatient Hospital | Attending: Neurology | Admitting: Neurology

## 2016-08-05 DIAGNOSIS — R918 Other nonspecific abnormal finding of lung field: Secondary | ICD-10-CM | POA: Diagnosis not present

## 2016-08-05 DIAGNOSIS — I4891 Unspecified atrial fibrillation: Secondary | ICD-10-CM | POA: Diagnosis present

## 2016-08-05 DIAGNOSIS — I63411 Cerebral infarction due to embolism of right middle cerebral artery: Secondary | ICD-10-CM | POA: Diagnosis not present

## 2016-08-05 DIAGNOSIS — I6601 Occlusion and stenosis of right middle cerebral artery: Secondary | ICD-10-CM | POA: Diagnosis not present

## 2016-08-05 DIAGNOSIS — R748 Abnormal levels of other serum enzymes: Secondary | ICD-10-CM | POA: Diagnosis not present

## 2016-08-05 DIAGNOSIS — I668 Occlusion and stenosis of other cerebral arteries: Secondary | ICD-10-CM | POA: Diagnosis present

## 2016-08-05 DIAGNOSIS — G8194 Hemiplegia, unspecified affecting left nondominant side: Secondary | ICD-10-CM | POA: Diagnosis present

## 2016-08-05 DIAGNOSIS — F1911 Other psychoactive substance abuse, in remission: Secondary | ICD-10-CM | POA: Insufficient documentation

## 2016-08-05 DIAGNOSIS — I2510 Atherosclerotic heart disease of native coronary artery without angina pectoris: Secondary | ICD-10-CM | POA: Diagnosis present

## 2016-08-05 DIAGNOSIS — G839 Paralytic syndrome, unspecified: Secondary | ICD-10-CM | POA: Diagnosis not present

## 2016-08-05 DIAGNOSIS — R930 Abnormal findings on diagnostic imaging of skull and head, not elsewhere classified: Secondary | ICD-10-CM | POA: Diagnosis not present

## 2016-08-05 DIAGNOSIS — R29810 Facial weakness: Secondary | ICD-10-CM | POA: Diagnosis not present

## 2016-08-05 DIAGNOSIS — I639 Cerebral infarction, unspecified: Secondary | ICD-10-CM

## 2016-08-05 DIAGNOSIS — M1990 Unspecified osteoarthritis, unspecified site: Secondary | ICD-10-CM

## 2016-08-05 DIAGNOSIS — I480 Paroxysmal atrial fibrillation: Secondary | ICD-10-CM

## 2016-08-05 DIAGNOSIS — I6789 Other cerebrovascular disease: Secondary | ICD-10-CM | POA: Diagnosis not present

## 2016-08-05 DIAGNOSIS — J189 Pneumonia, unspecified organism: Secondary | ICD-10-CM | POA: Diagnosis not present

## 2016-08-05 DIAGNOSIS — Z9119 Patient's noncompliance with other medical treatment and regimen: Secondary | ICD-10-CM | POA: Diagnosis not present

## 2016-08-05 DIAGNOSIS — I214 Non-ST elevation (NSTEMI) myocardial infarction: Secondary | ICD-10-CM | POA: Diagnosis not present

## 2016-08-05 DIAGNOSIS — I6523 Occlusion and stenosis of bilateral carotid arteries: Secondary | ICD-10-CM | POA: Diagnosis not present

## 2016-08-05 DIAGNOSIS — I619 Nontraumatic intracerebral hemorrhage, unspecified: Secondary | ICD-10-CM | POA: Diagnosis not present

## 2016-08-05 HISTORY — DX: Unspecified osteoarthritis, unspecified site: M19.90

## 2016-08-05 HISTORY — DX: Paroxysmal atrial fibrillation: I48.0

## 2016-08-05 HISTORY — DX: Cerebral infarction, unspecified: I63.9

## 2016-08-05 HISTORY — DX: Other psychoactive substance abuse, in remission: F19.11

## 2016-08-05 NOTE — H&P (Signed)
Neurology H&P  CC: Left sided weakness  History is obtained from:patient  HPI: Fernando Maddox is a 55 y.o. male who was being treated at [facility] hospital for pneumonia and atrial fibrillation. He was on theraputic lovenox for this. He had a neurological change at 2:45 am. He was seen normal at 2:30am, moving his left side normally at that time. At 2:45 am, his HR went to the 150s, and nursing went to check on him and found him to not be moving his left side at all. A code stroke was called, but it seems there was some delay with imaging. He was accepted in transfer at 5:48 am. At 6:10 am, a CTA was read as LVO.   LKW: 2:30 am tpa given?: no, theraputic lovenox.     ROS: A 14 point ROS was performed and is negative except as noted in the HPI.   PMH: AFib CAD  FHx: unclear  Social History: unclear   Exam: Current vital signs: There were no vitals filed for this visit. Vital signs in last 24 hours:     Physical Exam  Constitutional: Appears well-developed and well-nourished.  Psych: Affect appropriate to situation Eyes: No scleral injection HENT: No OP obstrucion Head: Normocephalic.  Cardiovascular: Irregular Respiratory: Effort normal and breath sounds normal to anterior ascultation GI: Soft.  No distension. There is no tenderness.  Skin: WDI  Neuro: Mental Status: Patient is awake, alert, oriented to person, place, month, year, and situation. He has severe left hemineglect Cranial Nerves: II: Visual Fields are full. Pupils are equal, round, and reactive to light.   III,IV, VI: R gaze deviation V: Facial sensation is decreased on the left VII: Facial movement is weak on the left.  Motor: Left side hemiplegia.  Sensory: Sensation decreased on the left.  Cerebellar: No clear ataxia on right.    I have reviewed the images obtained: CTA report reviewed - R M1 occlusion  Impression:  55 yo M with large R M1 Occlusion. Unfortunately, during his workup/transfer,  another LVO arrived in the ED and was taken to IR. When this patient arrived, the CTA report from the OSH was seen and it was deemed that this patient was also an IR candidate. Due to the IR suite being occupied and likely to take  a fair amount of time, he was transferred for IR.   Recommendations: 1)  Discussed with Domingo Dimes at [facility] medical center who accepted the patient in transfer for IR.   This patient is critically ill and at significant risk of neurological worsening, death and care requires constant monitoring of vital signs, hemodynamics,respiratory and cardiac monitoring, neurological assessment, discussion with family, other specialists and medical decision making of high complexity. I spent 45 minutes of neurocritical care time  in the care of  this patient.  Ritta Slot, MD [facility] [phone]  If 7pm- 7am, please page neurology on call as listed in AMION. 08/05/2016  8:49 AM

## 2016-08-06 DIAGNOSIS — I081 Rheumatic disorders of both mitral and tricuspid valves: Secondary | ICD-10-CM | POA: Diagnosis not present

## 2016-08-06 DIAGNOSIS — I517 Cardiomegaly: Secondary | ICD-10-CM | POA: Diagnosis not present

## 2016-08-06 DIAGNOSIS — J189 Pneumonia, unspecified organism: Secondary | ICD-10-CM | POA: Diagnosis not present

## 2016-08-06 DIAGNOSIS — I480 Paroxysmal atrial fibrillation: Secondary | ICD-10-CM | POA: Diagnosis not present

## 2016-08-06 DIAGNOSIS — Z87898 Personal history of other specified conditions: Secondary | ICD-10-CM | POA: Diagnosis not present

## 2016-08-06 DIAGNOSIS — I639 Cerebral infarction, unspecified: Secondary | ICD-10-CM | POA: Diagnosis not present

## 2016-08-06 DIAGNOSIS — I519 Heart disease, unspecified: Secondary | ICD-10-CM | POA: Diagnosis not present

## 2016-08-06 DIAGNOSIS — R918 Other nonspecific abnormal finding of lung field: Secondary | ICD-10-CM | POA: Diagnosis not present

## 2016-08-06 DIAGNOSIS — I63411 Cerebral infarction due to embolism of right middle cerebral artery: Secondary | ICD-10-CM | POA: Diagnosis not present

## 2016-08-06 DIAGNOSIS — I5189 Other ill-defined heart diseases: Secondary | ICD-10-CM | POA: Diagnosis not present

## 2016-08-06 LAB — GLUCOSE, CAPILLARY: Glucose-Capillary: 163 mg/dL — ABNORMAL HIGH (ref 65–99)

## 2016-08-06 NOTE — Discharge Summary (Signed)
Patient discharged at the time of admission, Admission note follows:  Neurology H&P  CC: Left sided weakness  History is obtained from:patient  HPI: Fernando Maddox is a 55 y.o. male who was being treated at [facility] hospital for pneumonia and atrial fibrillation. He was on theraputic lovenox for this. He had a neurological change at 2:45 am. He was seen normal at 2:30am, moving his left side normally at that time. At 2:45 am, his HR went to the 150s, and nursing went to check on him and found him to not be moving his left side at all. A code stroke was called, but it seems there was some delay with imaging. He was accepted in transfer at 5:48 am. At 6:10 am, a CTA was read as LVO.   LKW: 2:30 am tpa given?: no, theraputic lovenox.     ROS: A 14 point ROS was performed and is negative except as noted in the HPI.   PMH: AFib CAD  FHx: unclear  Social History: unclear   Exam: Current vital signs: There were no vitals filed for this visit. Vital signs in last 24 hours:     Physical Exam  Constitutional: Appears well-developed and well-nourished.  Psych: Affect appropriate to situation Eyes: No scleral injection HENT: No OP obstrucion Head: Normocephalic.  Cardiovascular: Irregular Respiratory: Effort normal and breath sounds normal to anterior ascultation GI: Soft.  No distension. There is no tenderness.  Skin: WDI  Neuro: Mental Status: Patient is awake, alert, oriented to person, place, month, year, and situation. He has severe left hemineglect Cranial Nerves: II: Visual Fields are full. Pupils are equal, round, and reactive to light.   III,IV, VI: R gaze deviation V: Facial sensation is decreased on the left VII: Facial movement is weak on the left.  Motor: Left side hemiplegia.  Sensory: Sensation decreased on the left.  Cerebellar: No clear ataxia on right.    I have reviewed the images obtained: CTA report reviewed - R M1  occlusion  Impression:  55 yo M with large R M1 Occlusion. Unfortunately, during his workup/transfer, another LVO arrived in the ED and was taken to IR. When this patient arrived, the CTA report from the OSH was seen and it was deemed that this patient was also an IR candidate. Due to the IR suite being occupied and likely to take  a fair amount of time, he was transferred for IR.   Recommendations: 1)  Discussed with Domingo Dimes at [facility] medical center who accepted the patient in transfer for IR.   This patient is critically ill and at significant risk of neurological worsening, death and care requires constant monitoring of vital signs, hemodynamics,respiratory and cardiac monitoring, neurological assessment, discussion with family, other specialists and medical decision making of high complexity. I spent 45 minutes of neurocritical care time  in the care of  this patient.  Ritta Slot, MD [facility] [phone]  If 7pm- 7am, please page neurology on call as listed in AMION. 08/05/2016  8:49 AM

## 2016-08-07 DIAGNOSIS — I5041 Acute combined systolic (congestive) and diastolic (congestive) heart failure: Secondary | ICD-10-CM | POA: Insufficient documentation

## 2016-08-07 DIAGNOSIS — E222 Syndrome of inappropriate secretion of antidiuretic hormone: Secondary | ICD-10-CM | POA: Insufficient documentation

## 2016-08-07 DIAGNOSIS — Z87898 Personal history of other specified conditions: Secondary | ICD-10-CM | POA: Diagnosis not present

## 2016-08-07 DIAGNOSIS — I480 Paroxysmal atrial fibrillation: Secondary | ICD-10-CM | POA: Diagnosis not present

## 2016-08-07 DIAGNOSIS — I2510 Atherosclerotic heart disease of native coronary artery without angina pectoris: Secondary | ICD-10-CM | POA: Diagnosis not present

## 2016-08-07 DIAGNOSIS — J189 Pneumonia, unspecified organism: Secondary | ICD-10-CM | POA: Diagnosis not present

## 2016-08-07 DIAGNOSIS — R918 Other nonspecific abnormal finding of lung field: Secondary | ICD-10-CM | POA: Diagnosis not present

## 2016-08-07 DIAGNOSIS — I639 Cerebral infarction, unspecified: Secondary | ICD-10-CM | POA: Diagnosis not present

## 2016-08-07 DIAGNOSIS — I2583 Coronary atherosclerosis due to lipid rich plaque: Secondary | ICD-10-CM | POA: Diagnosis not present

## 2016-08-07 HISTORY — DX: Acute combined systolic (congestive) and diastolic (congestive) heart failure: I50.41

## 2016-08-07 HISTORY — DX: Syndrome of inappropriate secretion of antidiuretic hormone: E22.2

## 2016-08-08 DIAGNOSIS — I639 Cerebral infarction, unspecified: Secondary | ICD-10-CM | POA: Diagnosis not present

## 2016-08-08 DIAGNOSIS — S2249XA Multiple fractures of ribs, unspecified side, initial encounter for closed fracture: Secondary | ICD-10-CM | POA: Diagnosis not present

## 2016-08-08 DIAGNOSIS — J90 Pleural effusion, not elsewhere classified: Secondary | ICD-10-CM | POA: Diagnosis not present

## 2016-08-08 DIAGNOSIS — I6300 Cerebral infarction due to thrombosis of unspecified precerebral artery: Secondary | ICD-10-CM | POA: Diagnosis not present

## 2016-08-09 DIAGNOSIS — G8194 Hemiplegia, unspecified affecting left nondominant side: Secondary | ICD-10-CM | POA: Diagnosis not present

## 2016-08-09 DIAGNOSIS — I10 Essential (primary) hypertension: Secondary | ICD-10-CM | POA: Diagnosis not present

## 2016-08-09 DIAGNOSIS — I639 Cerebral infarction, unspecified: Secondary | ICD-10-CM | POA: Diagnosis not present

## 2016-08-09 DIAGNOSIS — I480 Paroxysmal atrial fibrillation: Secondary | ICD-10-CM | POA: Diagnosis not present

## 2016-08-09 DIAGNOSIS — R918 Other nonspecific abnormal finding of lung field: Secondary | ICD-10-CM | POA: Diagnosis not present

## 2016-08-09 DIAGNOSIS — J189 Pneumonia, unspecified organism: Secondary | ICD-10-CM | POA: Diagnosis not present

## 2016-08-09 DIAGNOSIS — I63411 Cerebral infarction due to embolism of right middle cerebral artery: Secondary | ICD-10-CM | POA: Diagnosis not present

## 2016-08-09 DIAGNOSIS — E7800 Pure hypercholesterolemia, unspecified: Secondary | ICD-10-CM | POA: Diagnosis not present

## 2016-08-10 DIAGNOSIS — J189 Pneumonia, unspecified organism: Secondary | ICD-10-CM | POA: Diagnosis not present

## 2016-08-10 DIAGNOSIS — G8194 Hemiplegia, unspecified affecting left nondominant side: Secondary | ICD-10-CM | POA: Diagnosis not present

## 2016-08-10 DIAGNOSIS — E7800 Pure hypercholesterolemia, unspecified: Secondary | ICD-10-CM | POA: Diagnosis not present

## 2016-08-10 DIAGNOSIS — I63411 Cerebral infarction due to embolism of right middle cerebral artery: Secondary | ICD-10-CM | POA: Diagnosis not present

## 2016-08-10 DIAGNOSIS — I480 Paroxysmal atrial fibrillation: Secondary | ICD-10-CM | POA: Diagnosis not present

## 2016-08-10 DIAGNOSIS — R918 Other nonspecific abnormal finding of lung field: Secondary | ICD-10-CM | POA: Diagnosis not present

## 2016-08-10 DIAGNOSIS — I10 Essential (primary) hypertension: Secondary | ICD-10-CM | POA: Diagnosis not present

## 2016-08-10 DIAGNOSIS — I639 Cerebral infarction, unspecified: Secondary | ICD-10-CM | POA: Diagnosis not present

## 2016-08-11 DIAGNOSIS — S2243XA Multiple fractures of ribs, bilateral, initial encounter for closed fracture: Secondary | ICD-10-CM | POA: Diagnosis not present

## 2016-08-11 DIAGNOSIS — J189 Pneumonia, unspecified organism: Secondary | ICD-10-CM | POA: Insufficient documentation

## 2016-08-11 DIAGNOSIS — Z720 Tobacco use: Secondary | ICD-10-CM | POA: Insufficient documentation

## 2016-08-11 DIAGNOSIS — J984 Other disorders of lung: Secondary | ICD-10-CM | POA: Diagnosis not present

## 2016-08-11 HISTORY — DX: Tobacco use: Z72.0

## 2016-08-11 HISTORY — DX: Pneumonia, unspecified organism: J18.9

## 2016-08-13 DIAGNOSIS — Z720 Tobacco use: Secondary | ICD-10-CM | POA: Diagnosis not present

## 2016-08-13 DIAGNOSIS — E1100 Type 2 diabetes mellitus with hyperosmolarity without nonketotic hyperglycemic-hyperosmolar coma (NKHHC): Secondary | ICD-10-CM | POA: Diagnosis not present

## 2016-08-13 DIAGNOSIS — I480 Paroxysmal atrial fibrillation: Secondary | ICD-10-CM | POA: Diagnosis not present

## 2016-08-13 DIAGNOSIS — I10 Essential (primary) hypertension: Secondary | ICD-10-CM | POA: Diagnosis not present

## 2016-08-13 DIAGNOSIS — E7800 Pure hypercholesterolemia, unspecified: Secondary | ICD-10-CM | POA: Diagnosis not present

## 2016-08-13 DIAGNOSIS — I2583 Coronary atherosclerosis due to lipid rich plaque: Secondary | ICD-10-CM | POA: Diagnosis not present

## 2016-08-13 DIAGNOSIS — I2510 Atherosclerotic heart disease of native coronary artery without angina pectoris: Secondary | ICD-10-CM | POA: Diagnosis not present

## 2016-08-13 DIAGNOSIS — I639 Cerebral infarction, unspecified: Secondary | ICD-10-CM | POA: Diagnosis not present

## 2016-08-14 DIAGNOSIS — I4891 Unspecified atrial fibrillation: Secondary | ICD-10-CM | POA: Diagnosis not present

## 2016-08-14 DIAGNOSIS — I679 Cerebrovascular disease, unspecified: Secondary | ICD-10-CM | POA: Diagnosis not present

## 2016-08-19 DIAGNOSIS — I4891 Unspecified atrial fibrillation: Secondary | ICD-10-CM | POA: Diagnosis not present

## 2016-08-26 DIAGNOSIS — I998 Other disorder of circulatory system: Secondary | ICD-10-CM | POA: Diagnosis not present

## 2016-08-26 DIAGNOSIS — I4891 Unspecified atrial fibrillation: Secondary | ICD-10-CM | POA: Diagnosis not present

## 2016-09-01 DIAGNOSIS — I4891 Unspecified atrial fibrillation: Secondary | ICD-10-CM | POA: Diagnosis not present

## 2016-09-05 DIAGNOSIS — I4891 Unspecified atrial fibrillation: Secondary | ICD-10-CM | POA: Diagnosis not present

## 2016-09-12 DIAGNOSIS — M1990 Unspecified osteoarthritis, unspecified site: Secondary | ICD-10-CM | POA: Diagnosis not present

## 2016-09-12 DIAGNOSIS — I4891 Unspecified atrial fibrillation: Secondary | ICD-10-CM | POA: Diagnosis not present

## 2016-09-22 DIAGNOSIS — M62262 Nontraumatic ischemic infarction of muscle, left lower leg: Secondary | ICD-10-CM | POA: Diagnosis not present

## 2016-09-22 DIAGNOSIS — M62261 Nontraumatic ischemic infarction of muscle, right lower leg: Secondary | ICD-10-CM | POA: Diagnosis not present

## 2016-09-22 DIAGNOSIS — I998 Other disorder of circulatory system: Secondary | ICD-10-CM | POA: Diagnosis not present

## 2016-09-22 DIAGNOSIS — E11628 Type 2 diabetes mellitus with other skin complications: Secondary | ICD-10-CM | POA: Diagnosis not present

## 2016-10-08 DIAGNOSIS — M19132 Post-traumatic osteoarthritis, left wrist: Secondary | ICD-10-CM | POA: Diagnosis not present

## 2016-10-08 DIAGNOSIS — M19049 Primary osteoarthritis, unspecified hand: Secondary | ICD-10-CM | POA: Diagnosis not present

## 2016-10-14 DIAGNOSIS — R1084 Generalized abdominal pain: Secondary | ICD-10-CM | POA: Diagnosis not present

## 2016-10-14 DIAGNOSIS — R12 Heartburn: Secondary | ICD-10-CM | POA: Diagnosis not present

## 2016-10-14 DIAGNOSIS — I4891 Unspecified atrial fibrillation: Secondary | ICD-10-CM | POA: Diagnosis not present

## 2016-10-14 DIAGNOSIS — R933 Abnormal findings on diagnostic imaging of other parts of digestive tract: Secondary | ICD-10-CM | POA: Diagnosis not present

## 2016-10-14 DIAGNOSIS — I252 Old myocardial infarction: Secondary | ICD-10-CM | POA: Diagnosis not present

## 2016-10-15 DIAGNOSIS — I4891 Unspecified atrial fibrillation: Secondary | ICD-10-CM | POA: Diagnosis not present

## 2016-10-17 DIAGNOSIS — I4891 Unspecified atrial fibrillation: Secondary | ICD-10-CM | POA: Diagnosis not present

## 2016-10-21 DIAGNOSIS — K8020 Calculus of gallbladder without cholecystitis without obstruction: Secondary | ICD-10-CM | POA: Diagnosis not present

## 2016-10-21 DIAGNOSIS — R1084 Generalized abdominal pain: Secondary | ICD-10-CM | POA: Diagnosis not present

## 2016-10-22 DIAGNOSIS — I2510 Atherosclerotic heart disease of native coronary artery without angina pectoris: Secondary | ICD-10-CM | POA: Diagnosis not present

## 2016-10-22 DIAGNOSIS — R066 Hiccough: Secondary | ICD-10-CM | POA: Diagnosis not present

## 2016-10-22 DIAGNOSIS — K219 Gastro-esophageal reflux disease without esophagitis: Secondary | ICD-10-CM | POA: Diagnosis not present

## 2016-10-22 DIAGNOSIS — I4891 Unspecified atrial fibrillation: Secondary | ICD-10-CM | POA: Diagnosis not present

## 2016-10-29 DIAGNOSIS — I4891 Unspecified atrial fibrillation: Secondary | ICD-10-CM | POA: Diagnosis not present

## 2016-10-29 DIAGNOSIS — R945 Abnormal results of liver function studies: Secondary | ICD-10-CM | POA: Diagnosis not present

## 2016-10-29 DIAGNOSIS — I10 Essential (primary) hypertension: Secondary | ICD-10-CM | POA: Diagnosis not present

## 2016-10-30 DIAGNOSIS — I63411 Cerebral infarction due to embolism of right middle cerebral artery: Secondary | ICD-10-CM | POA: Diagnosis not present

## 2016-10-30 DIAGNOSIS — I420 Dilated cardiomyopathy: Secondary | ICD-10-CM | POA: Diagnosis not present

## 2016-10-30 DIAGNOSIS — Z959 Presence of cardiac and vascular implant and graft, unspecified: Secondary | ICD-10-CM | POA: Diagnosis not present

## 2016-10-30 DIAGNOSIS — Z01818 Encounter for other preprocedural examination: Secondary | ICD-10-CM | POA: Diagnosis not present

## 2016-10-30 DIAGNOSIS — R0609 Other forms of dyspnea: Secondary | ICD-10-CM | POA: Diagnosis not present

## 2016-10-30 DIAGNOSIS — I2510 Atherosclerotic heart disease of native coronary artery without angina pectoris: Secondary | ICD-10-CM | POA: Diagnosis not present

## 2016-10-30 DIAGNOSIS — I481 Persistent atrial fibrillation: Secondary | ICD-10-CM | POA: Diagnosis not present

## 2016-10-31 DIAGNOSIS — I4891 Unspecified atrial fibrillation: Secondary | ICD-10-CM | POA: Diagnosis not present

## 2016-10-31 DIAGNOSIS — I998 Other disorder of circulatory system: Secondary | ICD-10-CM | POA: Diagnosis not present

## 2016-10-31 DIAGNOSIS — R0602 Shortness of breath: Secondary | ICD-10-CM | POA: Diagnosis not present

## 2016-11-03 DIAGNOSIS — R112 Nausea with vomiting, unspecified: Secondary | ICD-10-CM | POA: Diagnosis not present

## 2016-11-03 DIAGNOSIS — I4891 Unspecified atrial fibrillation: Secondary | ICD-10-CM | POA: Diagnosis not present

## 2016-11-03 DIAGNOSIS — Z7901 Long term (current) use of anticoagulants: Secondary | ICD-10-CM | POA: Diagnosis not present

## 2016-11-03 DIAGNOSIS — R109 Unspecified abdominal pain: Secondary | ICD-10-CM | POA: Diagnosis not present

## 2016-11-04 DIAGNOSIS — R0602 Shortness of breath: Secondary | ICD-10-CM | POA: Diagnosis not present

## 2016-11-04 DIAGNOSIS — I743 Embolism and thrombosis of arteries of the lower extremities: Secondary | ICD-10-CM | POA: Diagnosis not present

## 2016-11-05 ENCOUNTER — Other Ambulatory Visit: Payer: Self-pay | Admitting: Vascular Surgery

## 2016-11-05 DIAGNOSIS — I4891 Unspecified atrial fibrillation: Secondary | ICD-10-CM | POA: Diagnosis not present

## 2016-11-05 DIAGNOSIS — I743 Embolism and thrombosis of arteries of the lower extremities: Secondary | ICD-10-CM

## 2016-11-06 ENCOUNTER — Ambulatory Visit (INDEPENDENT_AMBULATORY_CARE_PROVIDER_SITE_OTHER): Payer: Medicare Other | Admitting: Vascular Surgery

## 2016-11-06 ENCOUNTER — Encounter: Payer: Self-pay | Admitting: Vascular Surgery

## 2016-11-06 ENCOUNTER — Ambulatory Visit (HOSPITAL_COMMUNITY)
Admission: RE | Admit: 2016-11-06 | Discharge: 2016-11-06 | Disposition: A | Discharge status: Home / Self Care | Payer: Medicare Other | Source: Ambulatory Visit | Attending: Vascular Surgery | Admitting: Vascular Surgery

## 2016-11-06 ENCOUNTER — Encounter (HOSPITAL_COMMUNITY): Payer: Self-pay

## 2016-11-06 VITALS — BP 124/84 | HR 96 | Temp 98.9°F | Resp 20 | Ht 75.0 in | Wt 212.0 lb

## 2016-11-06 DIAGNOSIS — I96 Gangrene, not elsewhere classified: Secondary | ICD-10-CM | POA: Diagnosis not present

## 2016-11-06 DIAGNOSIS — I743 Embolism and thrombosis of arteries of the lower extremities: Secondary | ICD-10-CM

## 2016-11-06 DIAGNOSIS — R0989 Other specified symptoms and signs involving the circulatory and respiratory systems: Secondary | ICD-10-CM | POA: Diagnosis present

## 2016-11-06 NOTE — Progress Notes (Signed)
Referring Physician: Liston Alba M.D. Patient name: Fernando Maddox MRN: [id number] DOB: 15-Mar-1961 Sex: male  REASON FOR CONSULT: Gangrene toes  HPI: Fernando Maddox is a 56 y.o. male, sent for evaluation of dry gangrene of toes bilaterally. The patient had a fairly significant event in October 2007 10 where he had a stroke and myocardial infarction. He was also in atrial fibrillation at that time and thought to have had an A. fib embolic event. The changes in his toes were also thought to be embolic. He complains of a little bit of pain on the plantar aspect of his left foot when walking. The right foot is asymptomatic. He quit smoking after his stroke in October. He had a CT angiogram lower extremity runoff performed on 09/15/2016. This showed no significant arterial occlusive disease.  He states that the wounds on his feet are slowly healing. He is currently on aspirin and warfarin.  Past medical history: Stroke, atrial fibrillation, myocardial infarction, diabetes, hyperlipidemia, hypertension, chronic pain syndrome  Past surgical history: None SOCIAL HISTORY: Social History   Social History  . Marital status: Married    Spouse name: N/A  . Number of children: N/A  . Years of education: N/A   Occupational History  . Not on file.   Social History Main Topics  . Smoking status: Former Smoker    Types: Cigarettes    Start date: 11/06/2016  . Smokeless tobacco: Former Neurosurgeon    Quit date: 03/06/2016  . Alcohol use Not on file  . Drug use: Unknown  . Sexual activity: Not on file   Other Topics Concern  . Not on file   Social History Narrative  . No narrative on file    Allergies  Allergen Reactions  . Codeine     Current Outpatient Prescriptions  Medication Sig Dispense Refill  . aspirin 81 MG chewable tablet Chew by mouth daily.    . cholecalciferol (VITAMIN D) 1000 units tablet Take 1,000 Units by mouth daily.    . metoprolol (LOPRESSOR) 50 MG tablet Take 50 mg by  mouth 2 (two) times daily.    . vitamin B-12 (CYANOCOBALAMIN) 500 MCG tablet Take 500 mcg by mouth daily.    Marland Kitchen warfarin (COUMADIN) 2.5 MG tablet Take 2.5 mg by mouth daily.     No current facility-administered medications for this visit.     ROS:   General:  No weight loss, Fever, chills  HEENT: No recent headaches, no nasal bleeding, no visual changes, no sore throat  Neurologic: No dizziness, blackouts, seizures. No recent symptoms of stroke or mini- stroke. No recent episodes of slurred speech, or temporary blindness.  Cardiac: No recent episodes of chest pain/pressure, no shortness of breath at rest.  No shortness of breath with exertion.  Denies history of atrial fibrillation or irregular heartbeat  Vascular: No history of rest pain in feet.  No history of claudication.  No history of non-healing ulcer, No history of DVT   Pulmonary: No home oxygen, no productive cough, no hemoptysis,  No asthma or wheezing  Musculoskeletal:  [ ]  Arthritis, [ ]  Low back pain,  [ ]  Joint pain  Hematologic:No history of hypercoagulable state.  No history of easy bleeding.  No history of anemia  Gastrointestinal: No hematochezia or melena,  No gastroesophageal reflux, no trouble swallowing  Urinary: [ ]  chronic Kidney disease, [ ]  on HD - [ ]  MWF or [ ]  TTHS, [ ]  Burning with urination, [ ]  Frequent urination, [ ]   Difficulty urinating;   Skin: No rashes  Psychological: No history of anxiety,  No history of depression   Physical Examination  Vitals:   11/06/16 1037  BP: 124/84  Pulse: 96  Resp: 20  Temp: 98.9 F (37.2 C)  TempSrc: Oral  SpO2: 98%  Weight: 212 lb (96.2 kg)  Height: 6\' 3"  (1.905 m)    Body mass index is 26.5 kg/m.  General:  Alert and oriented, no acute distress HEENT: Normal Neck: No bruit or JVD Pulmonary: Clear to auscultation bilaterally Cardiac: Regular Rate and Rhythm without murmur Abdomen: Soft, non-tender, non-distended, no mass, no scars Skin: No  rash, superficial dry gangrenous changes tip of right first toe and tips of toes 1 through 4 on the left foot and over the first and fifth metatarsal head left foot, none of these appear to be full-thickness Extremity Pulses:  2+ radial, brachial, femoral, 1+ dorsalis pedis, posterior tibial pulses bilaterally Musculoskeletal: No deformity or edema  Neurologic: Upper and lower extremity motor 5/5 and symmetric  DATA:  CT Angio images reviewed no significant atherosclerotic change in his lower extremity arterial tree  Patient had bilateral ABIs today which I reviewed and interpreted. ABIs were greater than 1 bilaterally, normal  ASSESSMENT:  Bilateral dry gangrenous changes toes secondary to cardiac embolic event with no significant flow-limiting large vessel occlusive disease   PLAN:  Continue warfarin and aspirin no vascular surgical intervention necessary at this point follow-up on as-needed basis   Fabienne Bruns, MD [facility] Office: [phone] Pager: [phone]

## 2016-11-07 ENCOUNTER — Encounter: Payer: Medicare Other | Admitting: Surgery

## 2016-11-07 DIAGNOSIS — I4891 Unspecified atrial fibrillation: Secondary | ICD-10-CM | POA: Diagnosis not present

## 2016-11-10 DIAGNOSIS — R918 Other nonspecific abnormal finding of lung field: Secondary | ICD-10-CM | POA: Diagnosis not present

## 2016-11-10 DIAGNOSIS — I2510 Atherosclerotic heart disease of native coronary artery without angina pectoris: Secondary | ICD-10-CM | POA: Diagnosis not present

## 2016-11-10 DIAGNOSIS — R0600 Dyspnea, unspecified: Secondary | ICD-10-CM | POA: Diagnosis not present

## 2016-11-10 DIAGNOSIS — I700 Atherosclerosis of aorta: Secondary | ICD-10-CM | POA: Diagnosis not present

## 2016-11-11 ENCOUNTER — Encounter: Payer: Self-pay | Admitting: Internal Medicine

## 2016-11-13 ENCOUNTER — Encounter (HOSPITAL_COMMUNITY): Payer: Medicare Other

## 2016-11-14 ENCOUNTER — Encounter: Payer: Medicare Other | Admitting: Vascular Surgery

## 2016-11-14 DIAGNOSIS — I2510 Atherosclerotic heart disease of native coronary artery without angina pectoris: Secondary | ICD-10-CM | POA: Diagnosis present

## 2016-11-14 DIAGNOSIS — I420 Dilated cardiomyopathy: Secondary | ICD-10-CM | POA: Diagnosis present

## 2016-11-14 DIAGNOSIS — G464 Cerebellar stroke syndrome: Secondary | ICD-10-CM | POA: Diagnosis not present

## 2016-11-14 DIAGNOSIS — Z8673 Personal history of transient ischemic attack (TIA), and cerebral infarction without residual deficits: Secondary | ICD-10-CM | POA: Diagnosis not present

## 2016-11-14 DIAGNOSIS — I509 Heart failure, unspecified: Secondary | ICD-10-CM | POA: Diagnosis not present

## 2016-11-14 DIAGNOSIS — Z79899 Other long term (current) drug therapy: Secondary | ICD-10-CM | POA: Diagnosis not present

## 2016-11-14 DIAGNOSIS — Z7901 Long term (current) use of anticoagulants: Secondary | ICD-10-CM | POA: Diagnosis not present

## 2016-11-14 DIAGNOSIS — F17211 Nicotine dependence, cigarettes, in remission: Secondary | ICD-10-CM | POA: Diagnosis not present

## 2016-11-14 DIAGNOSIS — Z23 Encounter for immunization: Secondary | ICD-10-CM | POA: Diagnosis not present

## 2016-11-14 DIAGNOSIS — I5023 Acute on chronic systolic (congestive) heart failure: Secondary | ICD-10-CM | POA: Diagnosis present

## 2016-11-14 DIAGNOSIS — I517 Cardiomegaly: Secondary | ICD-10-CM | POA: Diagnosis not present

## 2016-11-14 DIAGNOSIS — Z955 Presence of coronary angioplasty implant and graft: Secondary | ICD-10-CM | POA: Diagnosis not present

## 2016-11-14 DIAGNOSIS — R05 Cough: Secondary | ICD-10-CM | POA: Diagnosis not present

## 2016-11-14 DIAGNOSIS — I4891 Unspecified atrial fibrillation: Secondary | ICD-10-CM | POA: Diagnosis not present

## 2016-11-14 DIAGNOSIS — R0602 Shortness of breath: Secondary | ICD-10-CM | POA: Diagnosis not present

## 2016-11-14 DIAGNOSIS — I5043 Acute on chronic combined systolic (congestive) and diastolic (congestive) heart failure: Secondary | ICD-10-CM | POA: Diagnosis not present

## 2016-11-14 DIAGNOSIS — I428 Other cardiomyopathies: Secondary | ICD-10-CM | POA: Diagnosis not present

## 2016-11-14 DIAGNOSIS — I110 Hypertensive heart disease with heart failure: Secondary | ICD-10-CM | POA: Diagnosis not present

## 2016-11-14 DIAGNOSIS — F17210 Nicotine dependence, cigarettes, uncomplicated: Secondary | ICD-10-CM | POA: Diagnosis present

## 2016-11-14 DIAGNOSIS — I513 Intracardiac thrombosis, not elsewhere classified: Secondary | ICD-10-CM | POA: Diagnosis present

## 2016-11-14 DIAGNOSIS — J189 Pneumonia, unspecified organism: Secondary | ICD-10-CM | POA: Diagnosis not present

## 2016-11-14 DIAGNOSIS — Z8249 Family history of ischemic heart disease and other diseases of the circulatory system: Secondary | ICD-10-CM | POA: Diagnosis not present

## 2016-11-14 DIAGNOSIS — J9601 Acute respiratory failure with hypoxia: Secondary | ICD-10-CM | POA: Diagnosis not present

## 2016-11-14 DIAGNOSIS — E7800 Pure hypercholesterolemia, unspecified: Secondary | ICD-10-CM | POA: Diagnosis present

## 2016-11-14 DIAGNOSIS — E119 Type 2 diabetes mellitus without complications: Secondary | ICD-10-CM | POA: Diagnosis present

## 2016-11-14 DIAGNOSIS — I482 Chronic atrial fibrillation: Secondary | ICD-10-CM | POA: Diagnosis not present

## 2016-11-19 DIAGNOSIS — J189 Pneumonia, unspecified organism: Secondary | ICD-10-CM | POA: Diagnosis not present

## 2016-11-19 DIAGNOSIS — I420 Dilated cardiomyopathy: Secondary | ICD-10-CM | POA: Diagnosis not present

## 2016-11-19 DIAGNOSIS — I4891 Unspecified atrial fibrillation: Secondary | ICD-10-CM | POA: Diagnosis not present

## 2016-11-21 DIAGNOSIS — I4891 Unspecified atrial fibrillation: Secondary | ICD-10-CM | POA: Diagnosis not present

## 2016-11-25 DIAGNOSIS — Z7901 Long term (current) use of anticoagulants: Secondary | ICD-10-CM | POA: Diagnosis not present

## 2016-11-25 DIAGNOSIS — I4891 Unspecified atrial fibrillation: Secondary | ICD-10-CM | POA: Diagnosis not present

## 2016-11-25 DIAGNOSIS — R5383 Other fatigue: Secondary | ICD-10-CM | POA: Diagnosis not present

## 2016-11-25 DIAGNOSIS — E119 Type 2 diabetes mellitus without complications: Secondary | ICD-10-CM | POA: Diagnosis not present

## 2016-11-27 DIAGNOSIS — I420 Dilated cardiomyopathy: Secondary | ICD-10-CM | POA: Diagnosis not present

## 2016-11-27 DIAGNOSIS — R0609 Other forms of dyspnea: Secondary | ICD-10-CM | POA: Diagnosis not present

## 2016-11-27 DIAGNOSIS — I2510 Atherosclerotic heart disease of native coronary artery without angina pectoris: Secondary | ICD-10-CM | POA: Diagnosis not present

## 2016-11-27 DIAGNOSIS — I5043 Acute on chronic combined systolic (congestive) and diastolic (congestive) heart failure: Secondary | ICD-10-CM | POA: Diagnosis not present

## 2016-11-27 DIAGNOSIS — I63411 Cerebral infarction due to embolism of right middle cerebral artery: Secondary | ICD-10-CM | POA: Diagnosis not present

## 2016-11-27 DIAGNOSIS — Z959 Presence of cardiac and vascular implant and graft, unspecified: Secondary | ICD-10-CM | POA: Diagnosis not present

## 2016-11-27 DIAGNOSIS — I481 Persistent atrial fibrillation: Secondary | ICD-10-CM | POA: Diagnosis not present

## 2016-11-29 DIAGNOSIS — G894 Chronic pain syndrome: Secondary | ICD-10-CM | POA: Diagnosis not present

## 2016-11-29 DIAGNOSIS — F329 Major depressive disorder, single episode, unspecified: Secondary | ICD-10-CM | POA: Diagnosis not present

## 2016-11-29 DIAGNOSIS — E119 Type 2 diabetes mellitus without complications: Secondary | ICD-10-CM | POA: Diagnosis not present

## 2016-11-29 DIAGNOSIS — Z5181 Encounter for therapeutic drug level monitoring: Secondary | ICD-10-CM | POA: Diagnosis not present

## 2016-11-29 DIAGNOSIS — I10 Essential (primary) hypertension: Secondary | ICD-10-CM | POA: Diagnosis not present

## 2016-11-29 DIAGNOSIS — Z794 Long term (current) use of insulin: Secondary | ICD-10-CM | POA: Diagnosis not present

## 2016-12-02 DIAGNOSIS — I96 Gangrene, not elsewhere classified: Secondary | ICD-10-CM | POA: Diagnosis not present

## 2016-12-02 DIAGNOSIS — Z7901 Long term (current) use of anticoagulants: Secondary | ICD-10-CM | POA: Diagnosis not present

## 2016-12-02 DIAGNOSIS — Z5181 Encounter for therapeutic drug level monitoring: Secondary | ICD-10-CM | POA: Diagnosis not present

## 2016-12-02 DIAGNOSIS — E119 Type 2 diabetes mellitus without complications: Secondary | ICD-10-CM | POA: Diagnosis not present

## 2016-12-02 DIAGNOSIS — I4891 Unspecified atrial fibrillation: Secondary | ICD-10-CM | POA: Diagnosis not present

## 2016-12-05 DIAGNOSIS — Z794 Long term (current) use of insulin: Secondary | ICD-10-CM | POA: Diagnosis not present

## 2016-12-05 DIAGNOSIS — I10 Essential (primary) hypertension: Secondary | ICD-10-CM | POA: Diagnosis not present

## 2016-12-05 DIAGNOSIS — E119 Type 2 diabetes mellitus without complications: Secondary | ICD-10-CM | POA: Diagnosis not present

## 2016-12-05 DIAGNOSIS — G894 Chronic pain syndrome: Secondary | ICD-10-CM | POA: Diagnosis not present

## 2016-12-05 DIAGNOSIS — F329 Major depressive disorder, single episode, unspecified: Secondary | ICD-10-CM | POA: Diagnosis not present

## 2016-12-05 DIAGNOSIS — Z5181 Encounter for therapeutic drug level monitoring: Secondary | ICD-10-CM | POA: Diagnosis not present

## 2016-12-08 DIAGNOSIS — I6789 Other cerebrovascular disease: Secondary | ICD-10-CM | POA: Diagnosis not present

## 2016-12-09 DIAGNOSIS — E1152 Type 2 diabetes mellitus with diabetic peripheral angiopathy with gangrene: Secondary | ICD-10-CM | POA: Diagnosis not present

## 2016-12-09 DIAGNOSIS — E11621 Type 2 diabetes mellitus with foot ulcer: Secondary | ICD-10-CM | POA: Diagnosis not present

## 2016-12-09 DIAGNOSIS — E11622 Type 2 diabetes mellitus with other skin ulcer: Secondary | ICD-10-CM | POA: Diagnosis not present

## 2016-12-09 DIAGNOSIS — S91105A Unspecified open wound of left lesser toe(s) without damage to nail, initial encounter: Secondary | ICD-10-CM | POA: Diagnosis not present

## 2016-12-09 DIAGNOSIS — I96 Gangrene, not elsewhere classified: Secondary | ICD-10-CM | POA: Diagnosis not present

## 2016-12-09 DIAGNOSIS — S91101A Unspecified open wound of right great toe without damage to nail, initial encounter: Secondary | ICD-10-CM | POA: Diagnosis not present

## 2016-12-09 DIAGNOSIS — L97522 Non-pressure chronic ulcer of other part of left foot with fat layer exposed: Secondary | ICD-10-CM | POA: Diagnosis not present

## 2016-12-10 DIAGNOSIS — I10 Essential (primary) hypertension: Secondary | ICD-10-CM | POA: Diagnosis not present

## 2016-12-10 DIAGNOSIS — G894 Chronic pain syndrome: Secondary | ICD-10-CM | POA: Diagnosis not present

## 2016-12-10 DIAGNOSIS — E119 Type 2 diabetes mellitus without complications: Secondary | ICD-10-CM | POA: Diagnosis not present

## 2016-12-10 DIAGNOSIS — Z5181 Encounter for therapeutic drug level monitoring: Secondary | ICD-10-CM | POA: Diagnosis not present

## 2016-12-10 DIAGNOSIS — Z794 Long term (current) use of insulin: Secondary | ICD-10-CM | POA: Diagnosis not present

## 2016-12-10 DIAGNOSIS — F329 Major depressive disorder, single episode, unspecified: Secondary | ICD-10-CM | POA: Diagnosis not present

## 2016-12-12 DIAGNOSIS — E119 Type 2 diabetes mellitus without complications: Secondary | ICD-10-CM | POA: Diagnosis not present

## 2016-12-12 DIAGNOSIS — G894 Chronic pain syndrome: Secondary | ICD-10-CM | POA: Diagnosis not present

## 2016-12-12 DIAGNOSIS — Z5181 Encounter for therapeutic drug level monitoring: Secondary | ICD-10-CM | POA: Diagnosis not present

## 2016-12-12 DIAGNOSIS — F329 Major depressive disorder, single episode, unspecified: Secondary | ICD-10-CM | POA: Diagnosis not present

## 2016-12-12 DIAGNOSIS — I10 Essential (primary) hypertension: Secondary | ICD-10-CM | POA: Diagnosis not present

## 2016-12-12 DIAGNOSIS — Z794 Long term (current) use of insulin: Secondary | ICD-10-CM | POA: Diagnosis not present

## 2016-12-16 DIAGNOSIS — M79672 Pain in left foot: Secondary | ICD-10-CM | POA: Diagnosis not present

## 2016-12-16 DIAGNOSIS — R571 Hypovolemic shock: Secondary | ICD-10-CM | POA: Diagnosis not present

## 2016-12-16 DIAGNOSIS — S91301A Unspecified open wound, right foot, initial encounter: Secondary | ICD-10-CM | POA: Diagnosis not present

## 2016-12-16 DIAGNOSIS — R52 Pain, unspecified: Secondary | ICD-10-CM | POA: Diagnosis not present

## 2016-12-16 DIAGNOSIS — E871 Hypo-osmolality and hyponatremia: Secondary | ICD-10-CM | POA: Diagnosis not present

## 2016-12-16 DIAGNOSIS — M7989 Other specified soft tissue disorders: Secondary | ICD-10-CM | POA: Diagnosis not present

## 2016-12-16 DIAGNOSIS — E860 Dehydration: Secondary | ICD-10-CM

## 2016-12-16 DIAGNOSIS — F1910 Other psychoactive substance abuse, uncomplicated: Secondary | ICD-10-CM | POA: Diagnosis not present

## 2016-12-16 DIAGNOSIS — L03116 Cellulitis of left lower limb: Secondary | ICD-10-CM | POA: Diagnosis not present

## 2016-12-16 DIAGNOSIS — R918 Other nonspecific abnormal finding of lung field: Secondary | ICD-10-CM | POA: Diagnosis not present

## 2016-12-16 DIAGNOSIS — A419 Sepsis, unspecified organism: Secondary | ICD-10-CM

## 2016-12-16 DIAGNOSIS — Z452 Encounter for adjustment and management of vascular access device: Secondary | ICD-10-CM | POA: Diagnosis not present

## 2016-12-16 DIAGNOSIS — S91302A Unspecified open wound, left foot, initial encounter: Secondary | ICD-10-CM | POA: Diagnosis not present

## 2016-12-16 DIAGNOSIS — N179 Acute kidney failure, unspecified: Secondary | ICD-10-CM | POA: Diagnosis not present

## 2016-12-16 DIAGNOSIS — N189 Chronic kidney disease, unspecified: Secondary | ICD-10-CM | POA: Diagnosis not present

## 2016-12-16 DIAGNOSIS — F1990 Other psychoactive substance use, unspecified, uncomplicated: Secondary | ICD-10-CM | POA: Diagnosis not present

## 2016-12-16 DIAGNOSIS — I4891 Unspecified atrial fibrillation: Secondary | ICD-10-CM | POA: Diagnosis not present

## 2016-12-16 DIAGNOSIS — L97529 Non-pressure chronic ulcer of other part of left foot with unspecified severity: Secondary | ICD-10-CM

## 2016-12-16 DIAGNOSIS — Z7901 Long term (current) use of anticoagulants: Secondary | ICD-10-CM | POA: Diagnosis not present

## 2016-12-17 DIAGNOSIS — L03116 Cellulitis of left lower limb: Secondary | ICD-10-CM | POA: Diagnosis not present

## 2016-12-17 DIAGNOSIS — Z7901 Long term (current) use of anticoagulants: Secondary | ICD-10-CM | POA: Diagnosis not present

## 2016-12-17 DIAGNOSIS — E860 Dehydration: Secondary | ICD-10-CM | POA: Diagnosis not present

## 2016-12-17 DIAGNOSIS — E1165 Type 2 diabetes mellitus with hyperglycemia: Secondary | ICD-10-CM | POA: Diagnosis not present

## 2016-12-17 DIAGNOSIS — A419 Sepsis, unspecified organism: Secondary | ICD-10-CM | POA: Diagnosis not present

## 2016-12-17 DIAGNOSIS — F1990 Other psychoactive substance use, unspecified, uncomplicated: Secondary | ICD-10-CM | POA: Diagnosis not present

## 2016-12-17 DIAGNOSIS — L97529 Non-pressure chronic ulcer of other part of left foot with unspecified severity: Secondary | ICD-10-CM | POA: Diagnosis not present

## 2016-12-17 DIAGNOSIS — I4891 Unspecified atrial fibrillation: Secondary | ICD-10-CM | POA: Diagnosis not present

## 2016-12-17 DIAGNOSIS — N189 Chronic kidney disease, unspecified: Secondary | ICD-10-CM | POA: Diagnosis not present

## 2016-12-17 DIAGNOSIS — F1910 Other psychoactive substance abuse, uncomplicated: Secondary | ICD-10-CM | POA: Diagnosis not present

## 2016-12-17 DIAGNOSIS — E871 Hypo-osmolality and hyponatremia: Secondary | ICD-10-CM | POA: Diagnosis not present

## 2016-12-18 DIAGNOSIS — F1990 Other psychoactive substance use, unspecified, uncomplicated: Secondary | ICD-10-CM | POA: Diagnosis not present

## 2016-12-18 DIAGNOSIS — A4102 Sepsis due to Methicillin resistant Staphylococcus aureus: Secondary | ICD-10-CM | POA: Diagnosis not present

## 2016-12-18 DIAGNOSIS — L97529 Non-pressure chronic ulcer of other part of left foot with unspecified severity: Secondary | ICD-10-CM | POA: Diagnosis not present

## 2016-12-18 DIAGNOSIS — N189 Chronic kidney disease, unspecified: Secondary | ICD-10-CM | POA: Diagnosis not present

## 2016-12-18 DIAGNOSIS — I4891 Unspecified atrial fibrillation: Secondary | ICD-10-CM | POA: Diagnosis not present

## 2016-12-18 DIAGNOSIS — E860 Dehydration: Secondary | ICD-10-CM | POA: Diagnosis not present

## 2016-12-18 DIAGNOSIS — E1165 Type 2 diabetes mellitus with hyperglycemia: Secondary | ICD-10-CM | POA: Diagnosis not present

## 2016-12-18 DIAGNOSIS — L03116 Cellulitis of left lower limb: Secondary | ICD-10-CM | POA: Diagnosis not present

## 2016-12-18 DIAGNOSIS — F1910 Other psychoactive substance abuse, uncomplicated: Secondary | ICD-10-CM | POA: Diagnosis not present

## 2016-12-18 DIAGNOSIS — A419 Sepsis, unspecified organism: Secondary | ICD-10-CM | POA: Diagnosis not present

## 2016-12-18 DIAGNOSIS — E871 Hypo-osmolality and hyponatremia: Secondary | ICD-10-CM | POA: Diagnosis not present

## 2016-12-18 DIAGNOSIS — Z7901 Long term (current) use of anticoagulants: Secondary | ICD-10-CM | POA: Diagnosis not present

## 2016-12-19 DIAGNOSIS — L97529 Non-pressure chronic ulcer of other part of left foot with unspecified severity: Secondary | ICD-10-CM | POA: Diagnosis not present

## 2016-12-19 DIAGNOSIS — F1910 Other psychoactive substance abuse, uncomplicated: Secondary | ICD-10-CM | POA: Diagnosis not present

## 2016-12-19 DIAGNOSIS — L97519 Non-pressure chronic ulcer of other part of right foot with unspecified severity: Secondary | ICD-10-CM | POA: Diagnosis not present

## 2016-12-19 DIAGNOSIS — L539 Erythematous condition, unspecified: Secondary | ICD-10-CM | POA: Diagnosis not present

## 2016-12-19 DIAGNOSIS — E871 Hypo-osmolality and hyponatremia: Secondary | ICD-10-CM | POA: Diagnosis not present

## 2016-12-19 DIAGNOSIS — L03116 Cellulitis of left lower limb: Secondary | ICD-10-CM | POA: Diagnosis not present

## 2016-12-19 DIAGNOSIS — L97524 Non-pressure chronic ulcer of other part of left foot with necrosis of bone: Secondary | ICD-10-CM | POA: Diagnosis not present

## 2016-12-19 DIAGNOSIS — I4891 Unspecified atrial fibrillation: Secondary | ICD-10-CM | POA: Diagnosis not present

## 2016-12-19 DIAGNOSIS — F1990 Other psychoactive substance use, unspecified, uncomplicated: Secondary | ICD-10-CM | POA: Diagnosis not present

## 2016-12-19 DIAGNOSIS — A4102 Sepsis due to Methicillin resistant Staphylococcus aureus: Secondary | ICD-10-CM | POA: Diagnosis not present

## 2016-12-19 DIAGNOSIS — I340 Nonrheumatic mitral (valve) insufficiency: Secondary | ICD-10-CM | POA: Diagnosis not present

## 2016-12-19 DIAGNOSIS — A419 Sepsis, unspecified organism: Secondary | ICD-10-CM | POA: Diagnosis not present

## 2016-12-19 DIAGNOSIS — N189 Chronic kidney disease, unspecified: Secondary | ICD-10-CM | POA: Diagnosis not present

## 2016-12-19 DIAGNOSIS — I361 Nonrheumatic tricuspid (valve) insufficiency: Secondary | ICD-10-CM | POA: Diagnosis not present

## 2016-12-19 DIAGNOSIS — E860 Dehydration: Secondary | ICD-10-CM | POA: Diagnosis not present

## 2016-12-19 DIAGNOSIS — Z7901 Long term (current) use of anticoagulants: Secondary | ICD-10-CM | POA: Diagnosis not present

## 2016-12-20 DIAGNOSIS — A4102 Sepsis due to Methicillin resistant Staphylococcus aureus: Secondary | ICD-10-CM | POA: Diagnosis not present

## 2016-12-20 DIAGNOSIS — Z7901 Long term (current) use of anticoagulants: Secondary | ICD-10-CM | POA: Diagnosis not present

## 2016-12-20 DIAGNOSIS — L97529 Non-pressure chronic ulcer of other part of left foot with unspecified severity: Secondary | ICD-10-CM | POA: Diagnosis not present

## 2016-12-20 DIAGNOSIS — F1990 Other psychoactive substance use, unspecified, uncomplicated: Secondary | ICD-10-CM | POA: Diagnosis not present

## 2016-12-20 DIAGNOSIS — F1910 Other psychoactive substance abuse, uncomplicated: Secondary | ICD-10-CM | POA: Diagnosis not present

## 2016-12-20 DIAGNOSIS — N189 Chronic kidney disease, unspecified: Secondary | ICD-10-CM | POA: Diagnosis not present

## 2016-12-20 DIAGNOSIS — E860 Dehydration: Secondary | ICD-10-CM | POA: Diagnosis not present

## 2016-12-20 DIAGNOSIS — A419 Sepsis, unspecified organism: Secondary | ICD-10-CM | POA: Diagnosis not present

## 2016-12-20 DIAGNOSIS — L03116 Cellulitis of left lower limb: Secondary | ICD-10-CM | POA: Diagnosis not present

## 2016-12-20 DIAGNOSIS — E871 Hypo-osmolality and hyponatremia: Secondary | ICD-10-CM | POA: Diagnosis not present

## 2016-12-20 DIAGNOSIS — I4891 Unspecified atrial fibrillation: Secondary | ICD-10-CM | POA: Diagnosis not present

## 2016-12-21 DIAGNOSIS — E860 Dehydration: Secondary | ICD-10-CM | POA: Diagnosis not present

## 2016-12-21 DIAGNOSIS — A419 Sepsis, unspecified organism: Secondary | ICD-10-CM | POA: Diagnosis not present

## 2016-12-21 DIAGNOSIS — E871 Hypo-osmolality and hyponatremia: Secondary | ICD-10-CM | POA: Diagnosis not present

## 2016-12-21 DIAGNOSIS — I4891 Unspecified atrial fibrillation: Secondary | ICD-10-CM | POA: Diagnosis not present

## 2016-12-21 DIAGNOSIS — L03116 Cellulitis of left lower limb: Secondary | ICD-10-CM | POA: Diagnosis not present

## 2016-12-21 DIAGNOSIS — F1910 Other psychoactive substance abuse, uncomplicated: Secondary | ICD-10-CM | POA: Diagnosis not present

## 2016-12-21 DIAGNOSIS — Z7901 Long term (current) use of anticoagulants: Secondary | ICD-10-CM | POA: Diagnosis not present

## 2016-12-21 DIAGNOSIS — A4102 Sepsis due to Methicillin resistant Staphylococcus aureus: Secondary | ICD-10-CM | POA: Diagnosis not present

## 2016-12-21 DIAGNOSIS — L97529 Non-pressure chronic ulcer of other part of left foot with unspecified severity: Secondary | ICD-10-CM | POA: Diagnosis not present

## 2016-12-21 DIAGNOSIS — F1990 Other psychoactive substance use, unspecified, uncomplicated: Secondary | ICD-10-CM | POA: Diagnosis not present

## 2016-12-21 DIAGNOSIS — N189 Chronic kidney disease, unspecified: Secondary | ICD-10-CM | POA: Diagnosis not present

## 2016-12-22 DIAGNOSIS — F1910 Other psychoactive substance abuse, uncomplicated: Secondary | ICD-10-CM | POA: Diagnosis not present

## 2016-12-22 DIAGNOSIS — L97529 Non-pressure chronic ulcer of other part of left foot with unspecified severity: Secondary | ICD-10-CM | POA: Diagnosis not present

## 2016-12-22 DIAGNOSIS — I4891 Unspecified atrial fibrillation: Secondary | ICD-10-CM | POA: Diagnosis not present

## 2016-12-22 DIAGNOSIS — E871 Hypo-osmolality and hyponatremia: Secondary | ICD-10-CM | POA: Diagnosis not present

## 2016-12-22 DIAGNOSIS — E860 Dehydration: Secondary | ICD-10-CM | POA: Diagnosis not present

## 2016-12-22 DIAGNOSIS — A419 Sepsis, unspecified organism: Secondary | ICD-10-CM | POA: Diagnosis not present

## 2016-12-22 DIAGNOSIS — L03116 Cellulitis of left lower limb: Secondary | ICD-10-CM | POA: Diagnosis not present

## 2016-12-22 DIAGNOSIS — F1990 Other psychoactive substance use, unspecified, uncomplicated: Secondary | ICD-10-CM | POA: Diagnosis not present

## 2016-12-22 DIAGNOSIS — N189 Chronic kidney disease, unspecified: Secondary | ICD-10-CM | POA: Diagnosis not present

## 2016-12-22 DIAGNOSIS — Z7901 Long term (current) use of anticoagulants: Secondary | ICD-10-CM | POA: Diagnosis not present

## 2016-12-23 DIAGNOSIS — F1910 Other psychoactive substance abuse, uncomplicated: Secondary | ICD-10-CM | POA: Diagnosis not present

## 2016-12-23 DIAGNOSIS — E871 Hypo-osmolality and hyponatremia: Secondary | ICD-10-CM | POA: Diagnosis not present

## 2016-12-23 DIAGNOSIS — L03116 Cellulitis of left lower limb: Secondary | ICD-10-CM | POA: Diagnosis not present

## 2016-12-23 DIAGNOSIS — L03115 Cellulitis of right lower limb: Secondary | ICD-10-CM | POA: Diagnosis not present

## 2016-12-23 DIAGNOSIS — F1990 Other psychoactive substance use, unspecified, uncomplicated: Secondary | ICD-10-CM | POA: Diagnosis not present

## 2016-12-23 DIAGNOSIS — E11621 Type 2 diabetes mellitus with foot ulcer: Secondary | ICD-10-CM | POA: Diagnosis not present

## 2016-12-23 DIAGNOSIS — A419 Sepsis, unspecified organism: Secondary | ICD-10-CM | POA: Diagnosis not present

## 2016-12-23 DIAGNOSIS — L02611 Cutaneous abscess of right foot: Secondary | ICD-10-CM | POA: Diagnosis not present

## 2016-12-23 DIAGNOSIS — E860 Dehydration: Secondary | ICD-10-CM | POA: Diagnosis not present

## 2016-12-23 DIAGNOSIS — L97529 Non-pressure chronic ulcer of other part of left foot with unspecified severity: Secondary | ICD-10-CM | POA: Diagnosis not present

## 2016-12-23 DIAGNOSIS — E1165 Type 2 diabetes mellitus with hyperglycemia: Secondary | ICD-10-CM | POA: Diagnosis not present

## 2016-12-23 DIAGNOSIS — S91302A Unspecified open wound, left foot, initial encounter: Secondary | ICD-10-CM | POA: Diagnosis not present

## 2016-12-23 DIAGNOSIS — I4891 Unspecified atrial fibrillation: Secondary | ICD-10-CM | POA: Diagnosis not present

## 2016-12-23 DIAGNOSIS — N189 Chronic kidney disease, unspecified: Secondary | ICD-10-CM | POA: Diagnosis not present

## 2016-12-23 DIAGNOSIS — S91301A Unspecified open wound, right foot, initial encounter: Secondary | ICD-10-CM | POA: Diagnosis not present

## 2016-12-23 DIAGNOSIS — Z7901 Long term (current) use of anticoagulants: Secondary | ICD-10-CM | POA: Diagnosis not present

## 2016-12-24 DIAGNOSIS — A4102 Sepsis due to Methicillin resistant Staphylococcus aureus: Secondary | ICD-10-CM | POA: Diagnosis not present

## 2016-12-24 DIAGNOSIS — E860 Dehydration: Secondary | ICD-10-CM | POA: Diagnosis not present

## 2016-12-24 DIAGNOSIS — L97529 Non-pressure chronic ulcer of other part of left foot with unspecified severity: Secondary | ICD-10-CM | POA: Diagnosis not present

## 2016-12-24 DIAGNOSIS — A419 Sepsis, unspecified organism: Secondary | ICD-10-CM | POA: Diagnosis not present

## 2016-12-24 DIAGNOSIS — E871 Hypo-osmolality and hyponatremia: Secondary | ICD-10-CM | POA: Diagnosis not present

## 2016-12-24 DIAGNOSIS — F1990 Other psychoactive substance use, unspecified, uncomplicated: Secondary | ICD-10-CM | POA: Diagnosis not present

## 2016-12-24 DIAGNOSIS — L03116 Cellulitis of left lower limb: Secondary | ICD-10-CM | POA: Diagnosis not present

## 2016-12-24 DIAGNOSIS — Z7901 Long term (current) use of anticoagulants: Secondary | ICD-10-CM | POA: Diagnosis not present

## 2016-12-24 DIAGNOSIS — I4891 Unspecified atrial fibrillation: Secondary | ICD-10-CM | POA: Diagnosis not present

## 2016-12-24 DIAGNOSIS — F1910 Other psychoactive substance abuse, uncomplicated: Secondary | ICD-10-CM | POA: Diagnosis not present

## 2016-12-24 DIAGNOSIS — N189 Chronic kidney disease, unspecified: Secondary | ICD-10-CM | POA: Diagnosis not present

## 2016-12-25 ENCOUNTER — Encounter: Payer: Self-pay | Admitting: Sports Medicine

## 2016-12-25 DIAGNOSIS — I4891 Unspecified atrial fibrillation: Secondary | ICD-10-CM | POA: Diagnosis not present

## 2016-12-25 DIAGNOSIS — E860 Dehydration: Secondary | ICD-10-CM | POA: Diagnosis not present

## 2016-12-25 DIAGNOSIS — Z7901 Long term (current) use of anticoagulants: Secondary | ICD-10-CM | POA: Diagnosis not present

## 2016-12-25 DIAGNOSIS — A4102 Sepsis due to Methicillin resistant Staphylococcus aureus: Secondary | ICD-10-CM | POA: Diagnosis not present

## 2016-12-25 DIAGNOSIS — L97529 Non-pressure chronic ulcer of other part of left foot with unspecified severity: Secondary | ICD-10-CM | POA: Diagnosis not present

## 2016-12-25 DIAGNOSIS — A419 Sepsis, unspecified organism: Secondary | ICD-10-CM | POA: Diagnosis not present

## 2016-12-25 DIAGNOSIS — F1910 Other psychoactive substance abuse, uncomplicated: Secondary | ICD-10-CM | POA: Diagnosis not present

## 2016-12-25 DIAGNOSIS — L03116 Cellulitis of left lower limb: Secondary | ICD-10-CM | POA: Diagnosis not present

## 2016-12-25 DIAGNOSIS — F1990 Other psychoactive substance use, unspecified, uncomplicated: Secondary | ICD-10-CM | POA: Diagnosis not present

## 2016-12-25 DIAGNOSIS — N189 Chronic kidney disease, unspecified: Secondary | ICD-10-CM | POA: Diagnosis not present

## 2016-12-25 DIAGNOSIS — E871 Hypo-osmolality and hyponatremia: Secondary | ICD-10-CM | POA: Diagnosis not present

## 2016-12-25 DIAGNOSIS — Z452 Encounter for adjustment and management of vascular access device: Secondary | ICD-10-CM | POA: Diagnosis not present

## 2016-12-26 DIAGNOSIS — F1910 Other psychoactive substance abuse, uncomplicated: Secondary | ICD-10-CM | POA: Diagnosis not present

## 2016-12-26 DIAGNOSIS — L97529 Non-pressure chronic ulcer of other part of left foot with unspecified severity: Secondary | ICD-10-CM | POA: Diagnosis not present

## 2016-12-26 DIAGNOSIS — L03116 Cellulitis of left lower limb: Secondary | ICD-10-CM | POA: Diagnosis not present

## 2016-12-26 DIAGNOSIS — R279 Unspecified lack of coordination: Secondary | ICD-10-CM | POA: Diagnosis not present

## 2016-12-26 DIAGNOSIS — I4891 Unspecified atrial fibrillation: Secondary | ICD-10-CM | POA: Diagnosis not present

## 2016-12-26 DIAGNOSIS — F1990 Other psychoactive substance use, unspecified, uncomplicated: Secondary | ICD-10-CM | POA: Diagnosis not present

## 2016-12-26 DIAGNOSIS — E871 Hypo-osmolality and hyponatremia: Secondary | ICD-10-CM | POA: Diagnosis not present

## 2016-12-26 DIAGNOSIS — Z7901 Long term (current) use of anticoagulants: Secondary | ICD-10-CM | POA: Diagnosis not present

## 2016-12-26 DIAGNOSIS — E860 Dehydration: Secondary | ICD-10-CM | POA: Diagnosis not present

## 2016-12-26 DIAGNOSIS — Z7401 Bed confinement status: Secondary | ICD-10-CM | POA: Diagnosis not present

## 2016-12-26 DIAGNOSIS — A419 Sepsis, unspecified organism: Secondary | ICD-10-CM | POA: Diagnosis not present

## 2016-12-26 DIAGNOSIS — N189 Chronic kidney disease, unspecified: Secondary | ICD-10-CM | POA: Diagnosis not present

## 2016-12-29 DIAGNOSIS — L03116 Cellulitis of left lower limb: Secondary | ICD-10-CM | POA: Diagnosis not present

## 2016-12-29 DIAGNOSIS — A4102 Sepsis due to Methicillin resistant Staphylococcus aureus: Secondary | ICD-10-CM | POA: Diagnosis not present

## 2016-12-29 DIAGNOSIS — L02611 Cutaneous abscess of right foot: Secondary | ICD-10-CM | POA: Diagnosis not present

## 2016-12-29 DIAGNOSIS — L97529 Non-pressure chronic ulcer of other part of left foot with unspecified severity: Secondary | ICD-10-CM | POA: Diagnosis not present

## 2017-01-02 ENCOUNTER — Other Ambulatory Visit: Payer: Self-pay | Admitting: *Deleted

## 2017-01-02 NOTE — Patient Outreach (Signed)
[facility]) Care Management  01/02/2017  Fernando Maddox 10-05-1961 [id number]   Communication with Jerene Canny, discharge planner at SNF. She reports patient has IV antibiotics until 3/20. His discharge plan is to go home, he is getting skilled nursing care as he is refusing therapy services.   Plan to follow up closer to discharge to assess for [facility] care management needs.   Alben Spittle. Albertha Ghee, RN, BSN, CCM  Post Acute Chartered loss adjuster [phone]) Business Cell  [phone]) Toll Free Office

## 2017-01-09 ENCOUNTER — Ambulatory Visit: Payer: Medicare Other | Admitting: Sports Medicine

## 2017-01-12 ENCOUNTER — Other Ambulatory Visit: Payer: Self-pay | Admitting: *Deleted

## 2017-01-12 DIAGNOSIS — L97529 Non-pressure chronic ulcer of other part of left foot with unspecified severity: Secondary | ICD-10-CM | POA: Diagnosis not present

## 2017-01-12 DIAGNOSIS — A4102 Sepsis due to Methicillin resistant Staphylococcus aureus: Secondary | ICD-10-CM | POA: Diagnosis not present

## 2017-01-12 DIAGNOSIS — L03116 Cellulitis of left lower limb: Secondary | ICD-10-CM | POA: Diagnosis not present

## 2017-01-12 DIAGNOSIS — L02611 Cutaneous abscess of right foot: Secondary | ICD-10-CM | POA: Diagnosis not present

## 2017-01-12 NOTE — Patient Outreach (Signed)
[facility]) Care Management  01/12/2017  Fernando Maddox [facility] 08-12-1961 [id number]   Met with patient at facility.   Patient reports he is doing fine, hoping to go home soon, when his IV antibiotics are completed. Patient states that he has diabetes and has infection in his feet.  He states that he has Dr. Gorden Harms for PCP.  He states he has transportation  He states he "gets by" on his medication, he has Medicare and Medicaid.  RNCM reviewed [facility] care management services, transition of care, SW, pharmacy.  Patient agreed to take a brochure with RNCM contact, but did not agree to sign up at this time.   Requested that facility discharge planner set up [place] calls upon discharge. Plan to sign off. Royetta Crochet. Laymond Purser, RN, BSN, [place] [phone]) Business Cell  [phone]) Toll Free Office

## 2017-01-13 ENCOUNTER — Telehealth: Payer: Self-pay | Admitting: *Deleted

## 2017-01-13 NOTE — Telephone Encounter (Addendum)
Gwen - [facility] states our doctor is managing pt's antibiotic therapy and it appears she has pt set to have antibiotics completed today, and they need orders so pt can be discharged. Dr. Marylene Land states if pt has completed his antibiotic he can be discharged. I informed Gwen and she states the orders show he should be on the antibiotic for 4 weeks and tentative stop date is 01/13/2017. I told her I have no orders pt has not been seen in our office and was probably seen as referral from hospital to Dr. Marylene Land. Gwen states she will investigate and I asked that she call when pt is discharged and I will have shedulers contact the next day to set up an appt to be seen in our [place] office.01/19/2017-Renee [facility] states pt does not have Santyl, and they need orders for wound care 3 time week.01/21/2017-Unable to leave message phone rang over 1 minute without answering service, asking pt if he had picked up the Santyl or if I needed to order from [facility] the specialty pharmacy for this product.01/22/2017-Phone rang over 1 minute without answering service pick up. Left message requesting pt's mtr have pt call [facility].01/23/2017-Unable to ask pt if the he had received Santyl, because pt's phone rang over 1 minute without answering service. Left message on mtr phone although no voicemail announcement when phone was answer, message request call back concerning medication.

## 2017-01-14 ENCOUNTER — Ambulatory Visit (INDEPENDENT_AMBULATORY_CARE_PROVIDER_SITE_OTHER): Payer: Self-pay | Admitting: Sports Medicine

## 2017-01-14 ENCOUNTER — Encounter: Payer: Self-pay | Admitting: Sports Medicine

## 2017-01-14 DIAGNOSIS — E1140 Type 2 diabetes mellitus with diabetic neuropathy, unspecified: Secondary | ICD-10-CM

## 2017-01-14 DIAGNOSIS — F1911 Other psychoactive substance abuse, in remission: Secondary | ICD-10-CM

## 2017-01-14 DIAGNOSIS — Z87898 Personal history of other specified conditions: Secondary | ICD-10-CM

## 2017-01-14 DIAGNOSIS — L97529 Non-pressure chronic ulcer of other part of left foot with unspecified severity: Secondary | ICD-10-CM

## 2017-01-14 DIAGNOSIS — L97519 Non-pressure chronic ulcer of other part of right foot with unspecified severity: Secondary | ICD-10-CM

## 2017-01-14 NOTE — Progress Notes (Signed)
Subjective: Fernando Maddox is a 56 y.o. male patient seen today in office for POV #1 (DOS 12/23/2016), S/P bilateral wound debridement and I and D right ankle with wound culture. Patient currently in rehabilitation and reports that he is ready to go home has completed antibiotics as of a few days ago. Patient denies pain at surgical site, denies calf pain, denies headache, chest pain, shortness of breath, nausea, vomiting, fever, or chills. No other issues noted.   Patient Active Problem List   Diagnosis Date Noted  . Community acquired pneumonia 08/11/2016  . Tobacco use 08/11/2016  . Acute combined systolic and diastolic CHF, NYHA class 1 ([place]) 08/07/2016  . SIADH (syndrome of inappropriate ADH production) ([place]) 08/07/2016  . History of drug abuse 08/05/2016  . Osteoarthritis 08/05/2016  . PAF (paroxysmal atrial fibrillation) ([place]) 08/05/2016  . Stroke ([place]) 08/05/2016  . Diabetes mellitus, type 2 ([place]) 02/11/2010  . Hyperlipemia 02/11/2010  . DEPRESSION/ANXIETY 02/11/2010  . HYPERTENSION, UNSPECIFIED 02/11/2010  . CAD (coronary artery disease) 02/11/2010  . Hypertension 02/11/2010  . ESOPHAGEAL STRICTURE 02/11/2010  . BACK PAIN 02/11/2010  . HEADACHE 02/11/2010  . CHEST PAIN 02/11/2010    Current Outpatient Prescriptions on File Prior to Visit  Medication Sig Dispense Refill  . aspirin 81 MG chewable tablet Chew by mouth daily.    . cholecalciferol (VITAMIN D) 1000 units tablet Take 1,000 Units by mouth daily.    . metoprolol (LOPRESSOR) 50 MG tablet Take 50 mg by mouth 2 (two) times daily.    . vitamin B-12 (CYANOCOBALAMIN) 500 MCG tablet Take 500 mcg by mouth daily.     No current facility-administered medications on file prior to visit.     Allergies  Allergen Reactions  . Codeine Itching  . Bee Venom Other (See Comments)    "Feels like his head is on fire when he goes outside in the sun"  . Hydrocodone-Acetaminophen Other (See Comments)  . Cephalexin      Objective: There were no vitals filed for this visit.  General: No acute distress, AAOx3  Right/Left foot: Partial thickness ulceration with sloughing scab, granular base at left hallux, left sub-met 1 and left sub-met 5 and right anterior lower leg/ankle, no erythema, no warmth, no drainage, no acute signs of infection noted, Capillary fill time <3 seconds in all digits, gross sensation present via light touch to right and left foot. Protective absent. No pain or crepitation with range of motion right and left foot.  No pain with calf compression.   Assessment and Plan:  Problem List Items Addressed This Visit      Other   History of drug abuse    Other Visit Diagnoses    Skin ulcers of foot, bilateral ([place])    -  Primary   Type 2 diabetes mellitus with diabetic neuropathy, unspecified long term insulin use status (HCC)       Relevant Medications   atorvastatin (LIPITOR) 80 MG tablet   enalapril (VASOTEC) 10 MG tablet   lisinopril (PRINIVIL,ZESTRIL) 10 MG tablet   lisinopril (PRINIVIL,ZESTRIL) 10 MG tablet      -Patient seen and evaluated -Mechanically debrided loose scabs at bilateral ulceration sites and Applied Betadine and dry sterile dressing to surgical sites on right and left foot secured with Coban and stockinet  -Advised patient to make sure to keep dressings clean, dry, and intact to left and right surgical site  -Recommend patient to be discharged from skilled nursing facility as long as he has home  wound care in place for Betadine 2 wound sites at minimum 3 times per week -Advised patient to limit activity to necessity  -Advised patient to ice and elevate as necessary for pain and edema control -Will plan for continue wound care at next office visit. In the meantime, patient to call office if any issues or problems arise. May try prisma collagen dressing at next visit.  Landis Martins, DPM

## 2017-01-16 DIAGNOSIS — L03116 Cellulitis of left lower limb: Secondary | ICD-10-CM | POA: Diagnosis not present

## 2017-01-16 DIAGNOSIS — L02611 Cutaneous abscess of right foot: Secondary | ICD-10-CM | POA: Diagnosis not present

## 2017-01-16 DIAGNOSIS — A4102 Sepsis due to Methicillin resistant Staphylococcus aureus: Secondary | ICD-10-CM | POA: Diagnosis not present

## 2017-01-16 DIAGNOSIS — L97529 Non-pressure chronic ulcer of other part of left foot with unspecified severity: Secondary | ICD-10-CM | POA: Diagnosis not present

## 2017-01-19 ENCOUNTER — Other Ambulatory Visit: Payer: Self-pay | Admitting: Sports Medicine

## 2017-01-19 DIAGNOSIS — I420 Dilated cardiomyopathy: Secondary | ICD-10-CM | POA: Diagnosis not present

## 2017-01-19 DIAGNOSIS — L97501 Non-pressure chronic ulcer of other part of unspecified foot limited to breakdown of skin: Secondary | ICD-10-CM

## 2017-01-19 DIAGNOSIS — I480 Paroxysmal atrial fibrillation: Secondary | ICD-10-CM | POA: Diagnosis not present

## 2017-01-19 DIAGNOSIS — I6990 Unspecified sequelae of unspecified cerebrovascular disease: Secondary | ICD-10-CM | POA: Insufficient documentation

## 2017-01-19 DIAGNOSIS — I2510 Atherosclerotic heart disease of native coronary artery without angina pectoris: Secondary | ICD-10-CM | POA: Diagnosis not present

## 2017-01-19 HISTORY — DX: Unspecified sequelae of unspecified cerebrovascular disease: I69.90

## 2017-01-19 MED ORDER — COLLAGENASE 250 UNIT/GM EX OINT: 1.0000 "application " | TOPICAL_OINTMENT | Freq: Every day | CUTANEOUS | 1 refills | Status: DC

## 2017-01-19 NOTE — Progress Notes (Signed)
Spoke with [facility] nurse and Rx Santyl to apply to ulcerations every other day and advised betadine as needed to macerated or draining ulcer sites. -Dr. Marylene Land

## 2017-01-20 NOTE — Telephone Encounter (Signed)
See orders only encounter from yesterday, I sent the Rx for Santyl to his pharmacy. The wound care orders should read cleanse with saline and apply santyl to foot ulcers bilateral. For any areas of maceration may use betadine. Cover all wounds with dry dressing, 3 times per week. -Dr. Kathie Rhodes

## 2017-01-21 DIAGNOSIS — L97529 Non-pressure chronic ulcer of other part of left foot with unspecified severity: Secondary | ICD-10-CM | POA: Diagnosis not present

## 2017-01-21 DIAGNOSIS — I4891 Unspecified atrial fibrillation: Secondary | ICD-10-CM | POA: Diagnosis not present

## 2017-01-23 ENCOUNTER — Encounter: Payer: Self-pay | Admitting: *Deleted

## 2017-01-29 ENCOUNTER — Encounter: Payer: Medicare Other | Admitting: Sports Medicine

## 2017-01-30 DIAGNOSIS — I4891 Unspecified atrial fibrillation: Secondary | ICD-10-CM | POA: Diagnosis not present

## 2017-01-30 DIAGNOSIS — I252 Old myocardial infarction: Secondary | ICD-10-CM | POA: Diagnosis not present

## 2017-01-30 DIAGNOSIS — E1140 Type 2 diabetes mellitus with diabetic neuropathy, unspecified: Secondary | ICD-10-CM | POA: Diagnosis not present

## 2017-01-30 DIAGNOSIS — L97522 Non-pressure chronic ulcer of other part of left foot with fat layer exposed: Secondary | ICD-10-CM | POA: Diagnosis not present

## 2017-01-30 DIAGNOSIS — L97401 Non-pressure chronic ulcer of unspecified heel and midfoot limited to breakdown of skin: Secondary | ICD-10-CM | POA: Diagnosis not present

## 2017-01-30 DIAGNOSIS — E11621 Type 2 diabetes mellitus with foot ulcer: Secondary | ICD-10-CM | POA: Diagnosis not present

## 2017-01-30 DIAGNOSIS — L97412 Non-pressure chronic ulcer of right heel and midfoot with fat layer exposed: Secondary | ICD-10-CM | POA: Diagnosis not present

## 2017-01-30 DIAGNOSIS — I2510 Atherosclerotic heart disease of native coronary artery without angina pectoris: Secondary | ICD-10-CM | POA: Diagnosis not present

## 2017-01-30 DIAGNOSIS — L97411 Non-pressure chronic ulcer of right heel and midfoot limited to breakdown of skin: Secondary | ICD-10-CM | POA: Diagnosis not present

## 2017-01-30 DIAGNOSIS — I110 Hypertensive heart disease with heart failure: Secondary | ICD-10-CM | POA: Diagnosis not present

## 2017-01-30 DIAGNOSIS — L97521 Non-pressure chronic ulcer of other part of left foot limited to breakdown of skin: Secondary | ICD-10-CM | POA: Diagnosis not present

## 2017-01-30 DIAGNOSIS — I509 Heart failure, unspecified: Secondary | ICD-10-CM | POA: Diagnosis not present

## 2017-02-06 DIAGNOSIS — L97509 Non-pressure chronic ulcer of other part of unspecified foot with unspecified severity: Secondary | ICD-10-CM | POA: Diagnosis not present

## 2017-02-06 DIAGNOSIS — L97812 Non-pressure chronic ulcer of other part of right lower leg with fat layer exposed: Secondary | ICD-10-CM | POA: Diagnosis not present

## 2017-02-06 DIAGNOSIS — L97522 Non-pressure chronic ulcer of other part of left foot with fat layer exposed: Secondary | ICD-10-CM | POA: Diagnosis not present

## 2017-02-06 DIAGNOSIS — E11621 Type 2 diabetes mellitus with foot ulcer: Secondary | ICD-10-CM | POA: Diagnosis not present

## 2017-02-06 DIAGNOSIS — L97409 Non-pressure chronic ulcer of unspecified heel and midfoot with unspecified severity: Secondary | ICD-10-CM | POA: Diagnosis not present

## 2017-02-06 DIAGNOSIS — L97919 Non-pressure chronic ulcer of unspecified part of right lower leg with unspecified severity: Secondary | ICD-10-CM | POA: Diagnosis not present

## 2017-02-17 DIAGNOSIS — L97522 Non-pressure chronic ulcer of other part of left foot with fat layer exposed: Secondary | ICD-10-CM | POA: Diagnosis not present

## 2017-02-17 DIAGNOSIS — L97501 Non-pressure chronic ulcer of other part of unspecified foot limited to breakdown of skin: Secondary | ICD-10-CM | POA: Diagnosis not present

## 2017-02-17 DIAGNOSIS — L97521 Non-pressure chronic ulcer of other part of left foot limited to breakdown of skin: Secondary | ICD-10-CM | POA: Diagnosis not present

## 2017-02-17 DIAGNOSIS — E11621 Type 2 diabetes mellitus with foot ulcer: Secondary | ICD-10-CM | POA: Diagnosis not present

## 2017-02-17 DIAGNOSIS — L97401 Non-pressure chronic ulcer of unspecified heel and midfoot limited to breakdown of skin: Secondary | ICD-10-CM | POA: Diagnosis not present

## 2017-03-06 DIAGNOSIS — L97522 Non-pressure chronic ulcer of other part of left foot with fat layer exposed: Secondary | ICD-10-CM | POA: Diagnosis not present

## 2017-03-06 DIAGNOSIS — L97521 Non-pressure chronic ulcer of other part of left foot limited to breakdown of skin: Secondary | ICD-10-CM | POA: Diagnosis not present

## 2017-03-06 DIAGNOSIS — E11621 Type 2 diabetes mellitus with foot ulcer: Secondary | ICD-10-CM | POA: Diagnosis not present

## 2017-03-13 DIAGNOSIS — E11621 Type 2 diabetes mellitus with foot ulcer: Secondary | ICD-10-CM | POA: Diagnosis not present

## 2017-03-13 DIAGNOSIS — L97422 Non-pressure chronic ulcer of left heel and midfoot with fat layer exposed: Secondary | ICD-10-CM | POA: Diagnosis not present

## 2017-03-13 DIAGNOSIS — L97522 Non-pressure chronic ulcer of other part of left foot with fat layer exposed: Secondary | ICD-10-CM | POA: Diagnosis not present

## 2017-03-18 DIAGNOSIS — I420 Dilated cardiomyopathy: Secondary | ICD-10-CM | POA: Diagnosis not present

## 2017-03-18 DIAGNOSIS — I6990 Unspecified sequelae of unspecified cerebrovascular disease: Secondary | ICD-10-CM | POA: Diagnosis not present

## 2017-03-18 DIAGNOSIS — I480 Paroxysmal atrial fibrillation: Secondary | ICD-10-CM | POA: Diagnosis not present

## 2017-03-18 DIAGNOSIS — I2510 Atherosclerotic heart disease of native coronary artery without angina pectoris: Secondary | ICD-10-CM | POA: Diagnosis not present

## 2017-03-20 DIAGNOSIS — E11621 Type 2 diabetes mellitus with foot ulcer: Secondary | ICD-10-CM | POA: Diagnosis not present

## 2017-03-20 DIAGNOSIS — L97422 Non-pressure chronic ulcer of left heel and midfoot with fat layer exposed: Secondary | ICD-10-CM | POA: Diagnosis not present

## 2017-03-20 DIAGNOSIS — L97522 Non-pressure chronic ulcer of other part of left foot with fat layer exposed: Secondary | ICD-10-CM | POA: Diagnosis not present

## 2017-03-27 DIAGNOSIS — L97422 Non-pressure chronic ulcer of left heel and midfoot with fat layer exposed: Secondary | ICD-10-CM | POA: Diagnosis not present

## 2017-03-27 DIAGNOSIS — E11621 Type 2 diabetes mellitus with foot ulcer: Secondary | ICD-10-CM | POA: Diagnosis not present

## 2017-03-27 DIAGNOSIS — L97522 Non-pressure chronic ulcer of other part of left foot with fat layer exposed: Secondary | ICD-10-CM | POA: Diagnosis not present

## 2017-03-31 DIAGNOSIS — Z79899 Other long term (current) drug therapy: Secondary | ICD-10-CM | POA: Diagnosis not present

## 2017-04-03 DIAGNOSIS — L089 Local infection of the skin and subcutaneous tissue, unspecified: Secondary | ICD-10-CM | POA: Diagnosis not present

## 2017-04-03 DIAGNOSIS — E119 Type 2 diabetes mellitus without complications: Secondary | ICD-10-CM | POA: Diagnosis not present

## 2017-04-03 DIAGNOSIS — E11621 Type 2 diabetes mellitus with foot ulcer: Secondary | ICD-10-CM | POA: Diagnosis not present

## 2017-04-03 DIAGNOSIS — J302 Other seasonal allergic rhinitis: Secondary | ICD-10-CM | POA: Diagnosis not present

## 2017-04-03 DIAGNOSIS — L97509 Non-pressure chronic ulcer of other part of unspecified foot with unspecified severity: Secondary | ICD-10-CM | POA: Diagnosis not present

## 2017-04-03 DIAGNOSIS — L97521 Non-pressure chronic ulcer of other part of left foot limited to breakdown of skin: Secondary | ICD-10-CM | POA: Diagnosis not present

## 2017-04-03 DIAGNOSIS — L97522 Non-pressure chronic ulcer of other part of left foot with fat layer exposed: Secondary | ICD-10-CM | POA: Diagnosis not present

## 2017-04-03 DIAGNOSIS — E1140 Type 2 diabetes mellitus with diabetic neuropathy, unspecified: Secondary | ICD-10-CM | POA: Diagnosis not present

## 2017-04-17 DIAGNOSIS — L97522 Non-pressure chronic ulcer of other part of left foot with fat layer exposed: Secondary | ICD-10-CM | POA: Diagnosis not present

## 2017-04-17 DIAGNOSIS — E11621 Type 2 diabetes mellitus with foot ulcer: Secondary | ICD-10-CM | POA: Diagnosis not present

## 2017-04-24 DIAGNOSIS — E11621 Type 2 diabetes mellitus with foot ulcer: Secondary | ICD-10-CM | POA: Diagnosis not present

## 2017-04-24 DIAGNOSIS — L97522 Non-pressure chronic ulcer of other part of left foot with fat layer exposed: Secondary | ICD-10-CM | POA: Diagnosis not present

## 2017-05-01 DIAGNOSIS — L97522 Non-pressure chronic ulcer of other part of left foot with fat layer exposed: Secondary | ICD-10-CM | POA: Diagnosis not present

## 2017-05-01 DIAGNOSIS — E11621 Type 2 diabetes mellitus with foot ulcer: Secondary | ICD-10-CM | POA: Diagnosis not present

## 2017-05-08 DIAGNOSIS — L97422 Non-pressure chronic ulcer of left heel and midfoot with fat layer exposed: Secondary | ICD-10-CM | POA: Diagnosis not present

## 2017-05-08 DIAGNOSIS — L97522 Non-pressure chronic ulcer of other part of left foot with fat layer exposed: Secondary | ICD-10-CM | POA: Diagnosis not present

## 2017-05-08 DIAGNOSIS — E11621 Type 2 diabetes mellitus with foot ulcer: Secondary | ICD-10-CM | POA: Diagnosis not present

## 2017-05-15 DIAGNOSIS — L97422 Non-pressure chronic ulcer of left heel and midfoot with fat layer exposed: Secondary | ICD-10-CM | POA: Diagnosis not present

## 2017-05-15 DIAGNOSIS — L97522 Non-pressure chronic ulcer of other part of left foot with fat layer exposed: Secondary | ICD-10-CM | POA: Diagnosis not present

## 2017-05-15 DIAGNOSIS — E11621 Type 2 diabetes mellitus with foot ulcer: Secondary | ICD-10-CM | POA: Diagnosis not present

## 2017-05-29 ENCOUNTER — Ambulatory Visit (INDEPENDENT_AMBULATORY_CARE_PROVIDER_SITE_OTHER): Payer: Medicare Other | Admitting: Sports Medicine

## 2017-05-29 DIAGNOSIS — M2141 Flat foot [pes planus] (acquired), right foot: Secondary | ICD-10-CM | POA: Diagnosis not present

## 2017-05-29 DIAGNOSIS — Z87898 Personal history of other specified conditions: Secondary | ICD-10-CM | POA: Diagnosis not present

## 2017-05-29 DIAGNOSIS — M2041 Other hammer toe(s) (acquired), right foot: Secondary | ICD-10-CM | POA: Diagnosis not present

## 2017-05-29 DIAGNOSIS — F1911 Other psychoactive substance abuse, in remission: Secondary | ICD-10-CM

## 2017-05-29 DIAGNOSIS — E119 Type 2 diabetes mellitus without complications: Secondary | ICD-10-CM

## 2017-05-29 DIAGNOSIS — L84 Corns and callosities: Secondary | ICD-10-CM

## 2017-05-29 DIAGNOSIS — L97521 Non-pressure chronic ulcer of other part of left foot limited to breakdown of skin: Secondary | ICD-10-CM

## 2017-05-29 DIAGNOSIS — E11621 Type 2 diabetes mellitus with foot ulcer: Secondary | ICD-10-CM | POA: Diagnosis not present

## 2017-05-29 DIAGNOSIS — M2142 Flat foot [pes planus] (acquired), left foot: Secondary | ICD-10-CM

## 2017-05-29 DIAGNOSIS — M2042 Other hammer toe(s) (acquired), left foot: Secondary | ICD-10-CM | POA: Diagnosis not present

## 2017-05-29 DIAGNOSIS — E1142 Type 2 diabetes mellitus with diabetic polyneuropathy: Secondary | ICD-10-CM | POA: Diagnosis not present

## 2017-05-29 DIAGNOSIS — L97522 Non-pressure chronic ulcer of other part of left foot with fat layer exposed: Secondary | ICD-10-CM | POA: Diagnosis not present

## 2017-05-29 DIAGNOSIS — I743 Embolism and thrombosis of arteries of the lower extremities: Secondary | ICD-10-CM | POA: Diagnosis not present

## 2017-05-29 NOTE — Progress Notes (Signed)
Subjective: Fernando Maddox is a 56 y.o. male patient with history of diabetes who presents to office today for diabetic shoes. States that he went to wound care center for his left 1st toe and that it is healing good with no drainage or issues, FBS 123 this AM. Denies any other issues.   Patient Active Problem List   Diagnosis Date Noted  . Community acquired pneumonia 08/11/2016  . Tobacco use 08/11/2016  . Acute combined systolic and diastolic CHF, NYHA class 1 ([place]) 08/07/2016  . SIADH (syndrome of inappropriate ADH production) ([place]) 08/07/2016  . History of drug abuse 08/05/2016  . Osteoarthritis 08/05/2016  . PAF (paroxysmal atrial fibrillation) ([place]) 08/05/2016  . Stroke ([place]) 08/05/2016  . Diabetes mellitus, type 2 ([place]) 02/11/2010  . Hyperlipemia 02/11/2010  . DEPRESSION/ANXIETY 02/11/2010  . HYPERTENSION, UNSPECIFIED 02/11/2010  . CAD (coronary artery disease) 02/11/2010  . Hypertension 02/11/2010  . ESOPHAGEAL STRICTURE 02/11/2010  . BACK PAIN 02/11/2010  . HEADACHE 02/11/2010  . CHEST PAIN 02/11/2010   Current Outpatient Prescriptions on File Prior to Visit  Medication Sig Dispense Refill  . aspirin 81 MG chewable tablet Chew by mouth daily.    Marland Kitchen atorvastatin (LIPITOR) 80 MG tablet Take 80 mg by mouth.    . chlorproMAZINE (THORAZINE) 25 MG tablet     . cholecalciferol (VITAMIN D) 1000 units tablet Take 1,000 Units by mouth daily.    . collagenase (SANTYL) ointment Apply 1 application topically daily. To foot ulcers 30 g 1  . cyanocobalamin 500 MCG tablet Take by mouth.    . enalapril (VASOTEC) 10 MG tablet Take 10 mg by mouth.    . furosemide (LASIX) 40 MG tablet Take 40 mg by mouth.    Marland Kitchen lisinopril (PRINIVIL,ZESTRIL) 10 MG tablet     . lisinopril (PRINIVIL,ZESTRIL) 10 MG tablet Take by mouth.    . metoprolol (LOPRESSOR) 50 MG tablet Take 50 mg by mouth 2 (two) times daily.    . metoprolol succinate (TOPROL-XL) 25 MG 24 hr tablet     . metoprolol succinate (TOPROL-XL)  50 MG 24 hr tablet Take 50 mg by mouth.    . pantoprazole (PROTONIX) 40 MG tablet     . PARoxetine (PAXIL) 40 MG tablet Take 40 mg by mouth.    . spironolactone (ALDACTONE) 50 MG tablet Take 50 mg by mouth.    . vitamin B-12 (CYANOCOBALAMIN) 500 MCG tablet Take 500 mcg by mouth daily.    Marland Kitchen warfarin (COUMADIN) 5 MG tablet      No current facility-administered medications on file prior to visit.    Allergies  Allergen Reactions  . Codeine Itching  . Bee Venom Other (See Comments)    "Feels like his head is on fire when he goes outside in the sun"  . Hydrocodone-Acetaminophen Other (See Comments)  . Cephalexin     No results found for this or any previous visit (from the past 2160 hour(s)).  Objective: General: Patient is awake, alert, and oriented x 3 and in no acute distress.  Integument: Skin is warm, dry and supple bilateral. Nails are short thickened and  dystrophic with subungual debris, consistent with onychomycosis, 1-5 bilateral. No signs of infection.+ preulcerative lesion sub met 5 on left. Prematurely healed ulceration at left 1st toe without infection. Remaining integument unremarkable.  Vasculature:  Dorsalis Pedis pulse 1/4 bilateral. Posterior Tibial pulse  1/4 bilateral.  Capillary fill time <3 sec 1-5 bilateral. Positive hair growth to the level of the digits. Temperature  gradient decreased. Mild varicosities present bilateral. No edema present bilateral.   Neurology: The patient has absent sensation measured with a 5.07/10g Semmes Weinstein Monofilament at all pedal sites bilateral . Vibratory sensation absent bilateral with tuning fork. No Babinski sign present bilateral.   Musculoskeletal: Asymptomatic pes planus and hammertoe deformity bilateral. Muscular strength 5/5 in all lower extremity muscular groups bilateral without pain on range of motion . No tenderness with calf compression bilateral.  Assessment and Plan: Problem List Items Addressed This Visit       Other   History of drug abuse    Other Visit Diagnoses    Encounter for comprehensive diabetic foot examination, type 2 diabetes mellitus ([place])    -  Primary   Toe ulcer, left, limited to breakdown of skin ([place])       prematurely healed   Pre-ulcerative calluses       Diabetic polyneuropathy associated with type 2 diabetes mellitus (HCC)       Hammer toes of both feet       Pes planus of both feet          -Examined patient. -Discussed and educated patient on diabetic foot care, especially with  regards to the vascular, neurological and musculoskeletal systems.  -[employer] step diabetic shoe order form was completed; office to contact primary care for approval / certification;  Office to arrange shoe fitting and dispensing. -Continue with wound care center follow up until discharged  -Patient to return for diabetic shoes -Patient advised to call the office if any problems or questions arise in the meantime.  Landis Martins, DPM

## 2017-06-19 DIAGNOSIS — Z09 Encounter for follow-up examination after completed treatment for conditions other than malignant neoplasm: Secondary | ICD-10-CM | POA: Diagnosis not present

## 2017-06-19 DIAGNOSIS — L97529 Non-pressure chronic ulcer of other part of left foot with unspecified severity: Secondary | ICD-10-CM | POA: Diagnosis not present

## 2017-06-19 DIAGNOSIS — E11621 Type 2 diabetes mellitus with foot ulcer: Secondary | ICD-10-CM | POA: Diagnosis not present

## 2017-07-07 DIAGNOSIS — Z6827 Body mass index (BMI) 27.0-27.9, adult: Secondary | ICD-10-CM | POA: Diagnosis not present

## 2017-07-07 DIAGNOSIS — E119 Type 2 diabetes mellitus without complications: Secondary | ICD-10-CM | POA: Diagnosis not present

## 2017-07-07 DIAGNOSIS — N400 Enlarged prostate without lower urinary tract symptoms: Secondary | ICD-10-CM | POA: Diagnosis not present

## 2017-07-07 DIAGNOSIS — I4891 Unspecified atrial fibrillation: Secondary | ICD-10-CM | POA: Diagnosis not present

## 2017-07-07 DIAGNOSIS — G894 Chronic pain syndrome: Secondary | ICD-10-CM | POA: Diagnosis not present

## 2017-07-07 DIAGNOSIS — E7800 Pure hypercholesterolemia, unspecified: Secondary | ICD-10-CM | POA: Diagnosis not present

## 2017-07-07 DIAGNOSIS — I429 Cardiomyopathy, unspecified: Secondary | ICD-10-CM | POA: Diagnosis not present

## 2017-07-07 DIAGNOSIS — E1140 Type 2 diabetes mellitus with diabetic neuropathy, unspecified: Secondary | ICD-10-CM | POA: Diagnosis not present

## 2017-07-07 DIAGNOSIS — Z1389 Encounter for screening for other disorder: Secondary | ICD-10-CM | POA: Diagnosis not present

## 2017-07-07 DIAGNOSIS — D649 Anemia, unspecified: Secondary | ICD-10-CM | POA: Diagnosis not present

## 2017-07-07 DIAGNOSIS — I10 Essential (primary) hypertension: Secondary | ICD-10-CM | POA: Diagnosis not present

## 2017-07-07 DIAGNOSIS — I2510 Atherosclerotic heart disease of native coronary artery without angina pectoris: Secondary | ICD-10-CM | POA: Diagnosis not present

## 2017-07-14 ENCOUNTER — Telehealth: Payer: Self-pay | Admitting: *Deleted

## 2017-07-14 NOTE — Telephone Encounter (Signed)
Scheduled patient for a diabetic shoe measurement

## 2017-07-16 ENCOUNTER — Ambulatory Visit (INDEPENDENT_AMBULATORY_CARE_PROVIDER_SITE_OTHER): Payer: Medicare Other | Admitting: Sports Medicine

## 2017-07-16 ENCOUNTER — Encounter (INDEPENDENT_AMBULATORY_CARE_PROVIDER_SITE_OTHER): Payer: Self-pay

## 2017-07-16 DIAGNOSIS — L84 Corns and callosities: Secondary | ICD-10-CM

## 2017-07-16 DIAGNOSIS — M2042 Other hammer toe(s) (acquired), left foot: Secondary | ICD-10-CM

## 2017-07-16 DIAGNOSIS — M2141 Flat foot [pes planus] (acquired), right foot: Secondary | ICD-10-CM

## 2017-07-16 DIAGNOSIS — M2142 Flat foot [pes planus] (acquired), left foot: Secondary | ICD-10-CM

## 2017-07-16 DIAGNOSIS — M2041 Other hammer toe(s) (acquired), right foot: Secondary | ICD-10-CM

## 2017-07-16 DIAGNOSIS — E1142 Type 2 diabetes mellitus with diabetic polyneuropathy: Secondary | ICD-10-CM

## 2017-07-16 NOTE — Progress Notes (Signed)
Patient ID: Fernando Maddox, male   DOB: July 14, 1961, 56 y.o.   MRN: [id number]   Patient presents to be measured for diabetic shoes and inserts with Betha CPed.  Patient measures at 13 wide on the brannock device and is foam molded.  Patient has chosen [employer] [id number] Double strap black. We will call when shoes and inserts arrive

## 2017-07-16 NOTE — Progress Notes (Signed)
Patient discussed with medical assistant. Agree with below. Patient to follow up as scheduled for continued care or sooner if problems or issues arise. -Dr. Oluwaferanmi Wain  

## 2017-09-09 ENCOUNTER — Ambulatory Visit (INDEPENDENT_AMBULATORY_CARE_PROVIDER_SITE_OTHER): Payer: Medicare Other | Admitting: Sports Medicine

## 2017-09-09 ENCOUNTER — Encounter: Payer: Self-pay | Admitting: Sports Medicine

## 2017-09-09 DIAGNOSIS — M2142 Flat foot [pes planus] (acquired), left foot: Secondary | ICD-10-CM

## 2017-09-09 DIAGNOSIS — M2141 Flat foot [pes planus] (acquired), right foot: Secondary | ICD-10-CM | POA: Diagnosis not present

## 2017-09-09 DIAGNOSIS — E1142 Type 2 diabetes mellitus with diabetic polyneuropathy: Secondary | ICD-10-CM | POA: Diagnosis not present

## 2017-09-09 DIAGNOSIS — M2041 Other hammer toe(s) (acquired), right foot: Secondary | ICD-10-CM

## 2017-09-09 DIAGNOSIS — M2042 Other hammer toe(s) (acquired), left foot: Secondary | ICD-10-CM

## 2017-09-09 DIAGNOSIS — L84 Corns and callosities: Secondary | ICD-10-CM | POA: Diagnosis not present

## 2017-09-09 NOTE — Patient Instructions (Signed)

## 2017-09-09 NOTE — Progress Notes (Signed)
Patient ID: Fernando Maddox, male   DOB: [birth year], 56 y.o.   MRN: [id number]   Patient presents for diabetic shoe pick up, shoes are tried on for good fit.  Patient received 1 pair New balance 813 black double strap in men's size 13 wide and 3 pairs custom molded diabetic inserts.  Verbal and written break in and wear instructions given.

## 2017-09-09 NOTE — Progress Notes (Signed)
Patient discussed with medical assistant. Agree with below. Patient to follow up as scheduled for continued care or sooner if problems or issues arise. -Dr. Kaamil Morefield  

## 2017-09-22 DIAGNOSIS — E11628 Type 2 diabetes mellitus with other skin complications: Secondary | ICD-10-CM | POA: Diagnosis not present

## 2017-09-22 DIAGNOSIS — I110 Hypertensive heart disease with heart failure: Secondary | ICD-10-CM | POA: Diagnosis not present

## 2017-09-22 DIAGNOSIS — G4730 Sleep apnea, unspecified: Secondary | ICD-10-CM | POA: Diagnosis not present

## 2017-09-22 DIAGNOSIS — L03031 Cellulitis of right toe: Secondary | ICD-10-CM | POA: Diagnosis not present

## 2017-09-22 DIAGNOSIS — E11621 Type 2 diabetes mellitus with foot ulcer: Secondary | ICD-10-CM | POA: Diagnosis not present

## 2017-09-22 DIAGNOSIS — E1140 Type 2 diabetes mellitus with diabetic neuropathy, unspecified: Secondary | ICD-10-CM | POA: Diagnosis not present

## 2017-09-22 DIAGNOSIS — I4891 Unspecified atrial fibrillation: Secondary | ICD-10-CM | POA: Diagnosis not present

## 2017-09-22 DIAGNOSIS — I509 Heart failure, unspecified: Secondary | ICD-10-CM | POA: Diagnosis not present

## 2017-09-22 DIAGNOSIS — I252 Old myocardial infarction: Secondary | ICD-10-CM | POA: Diagnosis not present

## 2017-09-22 DIAGNOSIS — M79671 Pain in right foot: Secondary | ICD-10-CM | POA: Diagnosis not present

## 2017-09-22 DIAGNOSIS — M1990 Unspecified osteoarthritis, unspecified site: Secondary | ICD-10-CM | POA: Diagnosis not present

## 2017-09-22 DIAGNOSIS — I2510 Atherosclerotic heart disease of native coronary artery without angina pectoris: Secondary | ICD-10-CM | POA: Diagnosis not present

## 2017-09-22 DIAGNOSIS — Z794 Long term (current) use of insulin: Secondary | ICD-10-CM | POA: Diagnosis not present

## 2017-09-22 DIAGNOSIS — L97512 Non-pressure chronic ulcer of other part of right foot with fat layer exposed: Secondary | ICD-10-CM | POA: Diagnosis not present

## 2017-09-29 DIAGNOSIS — I480 Paroxysmal atrial fibrillation: Secondary | ICD-10-CM | POA: Diagnosis not present

## 2017-09-29 DIAGNOSIS — I2510 Atherosclerotic heart disease of native coronary artery without angina pectoris: Secondary | ICD-10-CM | POA: Diagnosis not present

## 2017-09-29 DIAGNOSIS — L03116 Cellulitis of left lower limb: Secondary | ICD-10-CM | POA: Diagnosis not present

## 2017-09-29 DIAGNOSIS — L0390 Cellulitis, unspecified: Secondary | ICD-10-CM

## 2017-09-29 DIAGNOSIS — L03112 Cellulitis of left axilla: Secondary | ICD-10-CM | POA: Diagnosis not present

## 2017-09-29 DIAGNOSIS — E119 Type 2 diabetes mellitus without complications: Secondary | ICD-10-CM | POA: Diagnosis not present

## 2017-09-29 DIAGNOSIS — L03115 Cellulitis of right lower limb: Secondary | ICD-10-CM | POA: Diagnosis not present

## 2017-09-29 DIAGNOSIS — F1910 Other psychoactive substance abuse, uncomplicated: Secondary | ICD-10-CM

## 2017-09-29 DIAGNOSIS — I420 Dilated cardiomyopathy: Secondary | ICD-10-CM | POA: Diagnosis not present

## 2017-09-29 DIAGNOSIS — M7989 Other specified soft tissue disorders: Secondary | ICD-10-CM | POA: Diagnosis not present

## 2017-09-29 DIAGNOSIS — Z8673 Personal history of transient ischemic attack (TIA), and cerebral infarction without residual deficits: Secondary | ICD-10-CM | POA: Insufficient documentation

## 2017-09-29 HISTORY — DX: Other psychoactive substance abuse, uncomplicated: F19.10

## 2017-09-29 HISTORY — DX: Cellulitis, unspecified: L03.90

## 2017-09-29 HISTORY — DX: Personal history of transient ischemic attack (TIA), and cerebral infarction without residual deficits: Z86.73

## 2017-09-30 DIAGNOSIS — L03115 Cellulitis of right lower limb: Secondary | ICD-10-CM | POA: Diagnosis not present

## 2017-09-30 DIAGNOSIS — I420 Dilated cardiomyopathy: Secondary | ICD-10-CM | POA: Diagnosis not present

## 2017-09-30 DIAGNOSIS — E119 Type 2 diabetes mellitus without complications: Secondary | ICD-10-CM | POA: Diagnosis not present

## 2017-09-30 DIAGNOSIS — I2510 Atherosclerotic heart disease of native coronary artery without angina pectoris: Secondary | ICD-10-CM | POA: Diagnosis not present

## 2017-09-30 DIAGNOSIS — I480 Paroxysmal atrial fibrillation: Secondary | ICD-10-CM | POA: Diagnosis not present

## 2017-10-01 DIAGNOSIS — L97519 Non-pressure chronic ulcer of other part of right foot with unspecified severity: Secondary | ICD-10-CM

## 2017-10-01 DIAGNOSIS — E11628 Type 2 diabetes mellitus with other skin complications: Secondary | ICD-10-CM | POA: Diagnosis not present

## 2017-10-01 DIAGNOSIS — I2510 Atherosclerotic heart disease of native coronary artery without angina pectoris: Secondary | ICD-10-CM | POA: Diagnosis not present

## 2017-10-01 DIAGNOSIS — I480 Paroxysmal atrial fibrillation: Secondary | ICD-10-CM | POA: Diagnosis not present

## 2017-10-01 DIAGNOSIS — L03115 Cellulitis of right lower limb: Secondary | ICD-10-CM | POA: Diagnosis not present

## 2017-10-01 DIAGNOSIS — E11621 Type 2 diabetes mellitus with foot ulcer: Secondary | ICD-10-CM | POA: Insufficient documentation

## 2017-10-01 DIAGNOSIS — I420 Dilated cardiomyopathy: Secondary | ICD-10-CM | POA: Diagnosis not present

## 2017-10-01 HISTORY — DX: Type 2 diabetes mellitus with foot ulcer: E11.621

## 2017-10-01 HISTORY — DX: Type 2 diabetes mellitus with foot ulcer: L97.519

## 2017-10-02 DIAGNOSIS — I480 Paroxysmal atrial fibrillation: Secondary | ICD-10-CM | POA: Diagnosis not present

## 2017-10-02 DIAGNOSIS — L03115 Cellulitis of right lower limb: Secondary | ICD-10-CM | POA: Diagnosis not present

## 2017-10-02 DIAGNOSIS — I2510 Atherosclerotic heart disease of native coronary artery without angina pectoris: Secondary | ICD-10-CM | POA: Diagnosis not present

## 2017-10-02 DIAGNOSIS — I420 Dilated cardiomyopathy: Secondary | ICD-10-CM | POA: Diagnosis not present

## 2017-10-02 DIAGNOSIS — E11628 Type 2 diabetes mellitus with other skin complications: Secondary | ICD-10-CM | POA: Diagnosis not present

## 2017-10-03 DIAGNOSIS — E11628 Type 2 diabetes mellitus with other skin complications: Secondary | ICD-10-CM | POA: Diagnosis not present

## 2017-10-03 DIAGNOSIS — I420 Dilated cardiomyopathy: Secondary | ICD-10-CM | POA: Diagnosis not present

## 2017-10-03 DIAGNOSIS — I480 Paroxysmal atrial fibrillation: Secondary | ICD-10-CM | POA: Diagnosis not present

## 2017-10-03 DIAGNOSIS — M869 Osteomyelitis, unspecified: Secondary | ICD-10-CM | POA: Diagnosis not present

## 2017-10-03 DIAGNOSIS — R600 Localized edema: Secondary | ICD-10-CM | POA: Diagnosis not present

## 2017-10-03 DIAGNOSIS — L03115 Cellulitis of right lower limb: Secondary | ICD-10-CM | POA: Diagnosis not present

## 2017-10-03 DIAGNOSIS — I2510 Atherosclerotic heart disease of native coronary artery without angina pectoris: Secondary | ICD-10-CM | POA: Diagnosis not present

## 2017-10-04 DIAGNOSIS — E11622 Type 2 diabetes mellitus with other skin ulcer: Secondary | ICD-10-CM | POA: Diagnosis not present

## 2017-10-04 DIAGNOSIS — I2510 Atherosclerotic heart disease of native coronary artery without angina pectoris: Secondary | ICD-10-CM | POA: Diagnosis not present

## 2017-10-04 DIAGNOSIS — I480 Paroxysmal atrial fibrillation: Secondary | ICD-10-CM | POA: Diagnosis not present

## 2017-10-04 DIAGNOSIS — I420 Dilated cardiomyopathy: Secondary | ICD-10-CM | POA: Diagnosis not present

## 2017-10-04 DIAGNOSIS — M869 Osteomyelitis, unspecified: Secondary | ICD-10-CM | POA: Diagnosis not present

## 2017-10-05 DIAGNOSIS — Z452 Encounter for adjustment and management of vascular access device: Secondary | ICD-10-CM | POA: Diagnosis not present

## 2017-10-06 DIAGNOSIS — M009 Pyogenic arthritis, unspecified: Secondary | ICD-10-CM | POA: Diagnosis not present

## 2017-10-06 DIAGNOSIS — L03031 Cellulitis of right toe: Secondary | ICD-10-CM | POA: Diagnosis not present

## 2017-10-06 DIAGNOSIS — E119 Type 2 diabetes mellitus without complications: Secondary | ICD-10-CM | POA: Diagnosis not present

## 2017-10-06 DIAGNOSIS — I2510 Atherosclerotic heart disease of native coronary artery without angina pectoris: Secondary | ICD-10-CM | POA: Diagnosis not present

## 2017-10-06 DIAGNOSIS — L03116 Cellulitis of left lower limb: Secondary | ICD-10-CM | POA: Diagnosis not present

## 2017-10-06 DIAGNOSIS — L03115 Cellulitis of right lower limb: Secondary | ICD-10-CM | POA: Diagnosis not present

## 2017-10-06 DIAGNOSIS — M869 Osteomyelitis, unspecified: Secondary | ICD-10-CM

## 2017-10-06 DIAGNOSIS — E11628 Type 2 diabetes mellitus with other skin complications: Secondary | ICD-10-CM | POA: Diagnosis not present

## 2017-10-06 DIAGNOSIS — I480 Paroxysmal atrial fibrillation: Secondary | ICD-10-CM | POA: Diagnosis not present

## 2017-10-06 HISTORY — DX: Osteomyelitis, unspecified: M86.9

## 2017-10-07 DIAGNOSIS — I420 Dilated cardiomyopathy: Secondary | ICD-10-CM | POA: Diagnosis not present

## 2017-10-07 DIAGNOSIS — I480 Paroxysmal atrial fibrillation: Secondary | ICD-10-CM | POA: Diagnosis not present

## 2017-10-07 DIAGNOSIS — M869 Osteomyelitis, unspecified: Secondary | ICD-10-CM | POA: Diagnosis not present

## 2017-10-07 DIAGNOSIS — I2510 Atherosclerotic heart disease of native coronary artery without angina pectoris: Secondary | ICD-10-CM | POA: Diagnosis not present

## 2017-10-07 DIAGNOSIS — E11628 Type 2 diabetes mellitus with other skin complications: Secondary | ICD-10-CM | POA: Diagnosis not present

## 2017-10-07 DIAGNOSIS — F17200 Nicotine dependence, unspecified, uncomplicated: Secondary | ICD-10-CM | POA: Diagnosis not present

## 2018-02-05 DIAGNOSIS — S91302A Unspecified open wound, left foot, initial encounter: Secondary | ICD-10-CM | POA: Diagnosis not present

## 2018-02-05 DIAGNOSIS — L03116 Cellulitis of left lower limb: Secondary | ICD-10-CM | POA: Diagnosis not present

## 2018-02-06 DIAGNOSIS — M7989 Other specified soft tissue disorders: Secondary | ICD-10-CM | POA: Diagnosis not present

## 2018-02-06 DIAGNOSIS — E119 Type 2 diabetes mellitus without complications: Secondary | ICD-10-CM | POA: Diagnosis not present

## 2018-02-06 DIAGNOSIS — M129 Arthropathy, unspecified: Secondary | ICD-10-CM | POA: Diagnosis not present

## 2018-02-06 DIAGNOSIS — M869 Osteomyelitis, unspecified: Secondary | ICD-10-CM | POA: Diagnosis not present

## 2018-02-07 DIAGNOSIS — M129 Arthropathy, unspecified: Secondary | ICD-10-CM | POA: Diagnosis not present

## 2018-02-07 DIAGNOSIS — M7989 Other specified soft tissue disorders: Secondary | ICD-10-CM | POA: Diagnosis not present

## 2018-02-07 DIAGNOSIS — L03116 Cellulitis of left lower limb: Secondary | ICD-10-CM | POA: Diagnosis not present

## 2018-02-07 DIAGNOSIS — M86072 Acute hematogenous osteomyelitis, left ankle and foot: Secondary | ICD-10-CM | POA: Diagnosis not present

## 2018-02-08 DIAGNOSIS — M86172 Other acute osteomyelitis, left ankle and foot: Secondary | ICD-10-CM | POA: Diagnosis not present

## 2018-02-08 DIAGNOSIS — L03116 Cellulitis of left lower limb: Secondary | ICD-10-CM | POA: Diagnosis not present

## 2018-02-08 DIAGNOSIS — M129 Arthropathy, unspecified: Secondary | ICD-10-CM | POA: Diagnosis not present

## 2018-02-08 DIAGNOSIS — M7989 Other specified soft tissue disorders: Secondary | ICD-10-CM | POA: Diagnosis not present

## 2018-02-09 DIAGNOSIS — L03116 Cellulitis of left lower limb: Secondary | ICD-10-CM | POA: Diagnosis not present

## 2018-02-09 DIAGNOSIS — M7989 Other specified soft tissue disorders: Secondary | ICD-10-CM | POA: Diagnosis not present

## 2018-02-09 DIAGNOSIS — M129 Arthropathy, unspecified: Secondary | ICD-10-CM | POA: Diagnosis not present

## 2018-02-09 DIAGNOSIS — M86172 Other acute osteomyelitis, left ankle and foot: Secondary | ICD-10-CM | POA: Diagnosis not present

## 2018-02-10 DIAGNOSIS — M7989 Other specified soft tissue disorders: Secondary | ICD-10-CM | POA: Diagnosis not present

## 2018-02-10 DIAGNOSIS — M129 Arthropathy, unspecified: Secondary | ICD-10-CM | POA: Diagnosis not present

## 2018-02-10 DIAGNOSIS — M86172 Other acute osteomyelitis, left ankle and foot: Secondary | ICD-10-CM | POA: Diagnosis not present

## 2018-02-10 DIAGNOSIS — L03116 Cellulitis of left lower limb: Secondary | ICD-10-CM | POA: Diagnosis not present

## 2018-02-11 DIAGNOSIS — I4891 Unspecified atrial fibrillation: Secondary | ICD-10-CM | POA: Diagnosis not present

## 2018-02-11 DIAGNOSIS — M86172 Other acute osteomyelitis, left ankle and foot: Secondary | ICD-10-CM | POA: Diagnosis not present

## 2018-02-11 DIAGNOSIS — I2510 Atherosclerotic heart disease of native coronary artery without angina pectoris: Secondary | ICD-10-CM | POA: Diagnosis not present

## 2018-02-11 DIAGNOSIS — I429 Cardiomyopathy, unspecified: Secondary | ICD-10-CM | POA: Diagnosis not present

## 2018-02-24 DIAGNOSIS — I509 Heart failure, unspecified: Secondary | ICD-10-CM | POA: Diagnosis not present

## 2018-02-24 DIAGNOSIS — R0602 Shortness of breath: Secondary | ICD-10-CM | POA: Diagnosis not present

## 2018-02-24 DIAGNOSIS — R0600 Dyspnea, unspecified: Secondary | ICD-10-CM | POA: Diagnosis not present

## 2018-02-24 DIAGNOSIS — I110 Hypertensive heart disease with heart failure: Secondary | ICD-10-CM | POA: Diagnosis not present

## 2018-02-24 DIAGNOSIS — Z5181 Encounter for therapeutic drug level monitoring: Secondary | ICD-10-CM | POA: Diagnosis not present

## 2018-02-26 DIAGNOSIS — I4891 Unspecified atrial fibrillation: Secondary | ICD-10-CM | POA: Diagnosis not present

## 2018-02-26 DIAGNOSIS — I5023 Acute on chronic systolic (congestive) heart failure: Secondary | ICD-10-CM

## 2018-02-26 DIAGNOSIS — R0609 Other forms of dyspnea: Secondary | ICD-10-CM | POA: Diagnosis not present

## 2018-02-26 DIAGNOSIS — I110 Hypertensive heart disease with heart failure: Secondary | ICD-10-CM | POA: Diagnosis not present

## 2018-02-26 DIAGNOSIS — I482 Chronic atrial fibrillation: Secondary | ICD-10-CM | POA: Diagnosis not present

## 2018-02-26 DIAGNOSIS — I5043 Acute on chronic combined systolic (congestive) and diastolic (congestive) heart failure: Secondary | ICD-10-CM | POA: Diagnosis not present

## 2018-02-26 DIAGNOSIS — I2510 Atherosclerotic heart disease of native coronary artery without angina pectoris: Secondary | ICD-10-CM | POA: Diagnosis not present

## 2018-02-26 DIAGNOSIS — K8020 Calculus of gallbladder without cholecystitis without obstruction: Secondary | ICD-10-CM | POA: Diagnosis not present

## 2018-02-26 DIAGNOSIS — E119 Type 2 diabetes mellitus without complications: Secondary | ICD-10-CM | POA: Diagnosis not present

## 2018-02-26 DIAGNOSIS — E871 Hypo-osmolality and hyponatremia: Secondary | ICD-10-CM | POA: Diagnosis not present

## 2018-02-26 DIAGNOSIS — F17210 Nicotine dependence, cigarettes, uncomplicated: Secondary | ICD-10-CM | POA: Diagnosis not present

## 2018-02-26 DIAGNOSIS — I200 Unstable angina: Secondary | ICD-10-CM | POA: Diagnosis not present

## 2018-02-26 DIAGNOSIS — F1210 Cannabis abuse, uncomplicated: Secondary | ICD-10-CM | POA: Diagnosis not present

## 2018-02-26 DIAGNOSIS — R0602 Shortness of breath: Secondary | ICD-10-CM | POA: Diagnosis not present

## 2018-02-27 DIAGNOSIS — E119 Type 2 diabetes mellitus without complications: Secondary | ICD-10-CM | POA: Diagnosis not present

## 2018-02-27 DIAGNOSIS — I5023 Acute on chronic systolic (congestive) heart failure: Secondary | ICD-10-CM | POA: Diagnosis not present

## 2018-02-27 DIAGNOSIS — K8020 Calculus of gallbladder without cholecystitis without obstruction: Secondary | ICD-10-CM | POA: Diagnosis not present

## 2018-02-27 DIAGNOSIS — I2510 Atherosclerotic heart disease of native coronary artery without angina pectoris: Secondary | ICD-10-CM | POA: Diagnosis not present

## 2018-02-27 DIAGNOSIS — E871 Hypo-osmolality and hyponatremia: Secondary | ICD-10-CM | POA: Diagnosis not present

## 2018-02-27 DIAGNOSIS — Z01810 Encounter for preprocedural cardiovascular examination: Secondary | ICD-10-CM | POA: Diagnosis not present

## 2018-02-27 DIAGNOSIS — I482 Chronic atrial fibrillation: Secondary | ICD-10-CM | POA: Diagnosis not present

## 2018-02-27 DIAGNOSIS — F17210 Nicotine dependence, cigarettes, uncomplicated: Secondary | ICD-10-CM | POA: Diagnosis not present

## 2018-02-27 DIAGNOSIS — I110 Hypertensive heart disease with heart failure: Secondary | ICD-10-CM | POA: Diagnosis not present

## 2018-02-27 DIAGNOSIS — I4891 Unspecified atrial fibrillation: Secondary | ICD-10-CM | POA: Diagnosis not present

## 2018-02-27 DIAGNOSIS — F1210 Cannabis abuse, uncomplicated: Secondary | ICD-10-CM | POA: Diagnosis not present

## 2018-02-27 DIAGNOSIS — R0609 Other forms of dyspnea: Secondary | ICD-10-CM | POA: Diagnosis not present

## 2018-02-28 DIAGNOSIS — I110 Hypertensive heart disease with heart failure: Secondary | ICD-10-CM | POA: Diagnosis not present

## 2018-02-28 DIAGNOSIS — I5023 Acute on chronic systolic (congestive) heart failure: Secondary | ICD-10-CM | POA: Diagnosis not present

## 2018-02-28 DIAGNOSIS — F17210 Nicotine dependence, cigarettes, uncomplicated: Secondary | ICD-10-CM | POA: Diagnosis not present

## 2018-02-28 DIAGNOSIS — I2510 Atherosclerotic heart disease of native coronary artery without angina pectoris: Secondary | ICD-10-CM | POA: Diagnosis not present

## 2018-02-28 DIAGNOSIS — F1210 Cannabis abuse, uncomplicated: Secondary | ICD-10-CM | POA: Diagnosis not present

## 2018-02-28 DIAGNOSIS — E119 Type 2 diabetes mellitus without complications: Secondary | ICD-10-CM | POA: Diagnosis not present

## 2018-02-28 DIAGNOSIS — K8020 Calculus of gallbladder without cholecystitis without obstruction: Secondary | ICD-10-CM | POA: Diagnosis not present

## 2018-02-28 DIAGNOSIS — E871 Hypo-osmolality and hyponatremia: Secondary | ICD-10-CM | POA: Diagnosis not present

## 2018-02-28 DIAGNOSIS — I482 Chronic atrial fibrillation: Secondary | ICD-10-CM | POA: Diagnosis not present

## 2018-02-28 DIAGNOSIS — R0609 Other forms of dyspnea: Secondary | ICD-10-CM | POA: Diagnosis not present

## 2018-03-01 DIAGNOSIS — E871 Hypo-osmolality and hyponatremia: Secondary | ICD-10-CM | POA: Diagnosis not present

## 2018-03-01 DIAGNOSIS — R0609 Other forms of dyspnea: Secondary | ICD-10-CM | POA: Diagnosis not present

## 2018-03-01 DIAGNOSIS — F17210 Nicotine dependence, cigarettes, uncomplicated: Secondary | ICD-10-CM | POA: Diagnosis not present

## 2018-03-01 DIAGNOSIS — K8020 Calculus of gallbladder without cholecystitis without obstruction: Secondary | ICD-10-CM | POA: Diagnosis not present

## 2018-03-01 DIAGNOSIS — I5023 Acute on chronic systolic (congestive) heart failure: Secondary | ICD-10-CM | POA: Diagnosis not present

## 2018-03-01 DIAGNOSIS — F1210 Cannabis abuse, uncomplicated: Secondary | ICD-10-CM | POA: Diagnosis not present

## 2018-03-01 DIAGNOSIS — I2510 Atherosclerotic heart disease of native coronary artery without angina pectoris: Secondary | ICD-10-CM | POA: Diagnosis not present

## 2018-03-01 DIAGNOSIS — E119 Type 2 diabetes mellitus without complications: Secondary | ICD-10-CM | POA: Diagnosis not present

## 2018-03-01 DIAGNOSIS — I110 Hypertensive heart disease with heart failure: Secondary | ICD-10-CM | POA: Diagnosis not present

## 2018-03-01 DIAGNOSIS — I482 Chronic atrial fibrillation: Secondary | ICD-10-CM | POA: Diagnosis not present

## 2018-03-02 DIAGNOSIS — I110 Hypertensive heart disease with heart failure: Secondary | ICD-10-CM | POA: Diagnosis not present

## 2018-03-02 DIAGNOSIS — E871 Hypo-osmolality and hyponatremia: Secondary | ICD-10-CM | POA: Diagnosis not present

## 2018-03-02 DIAGNOSIS — R0609 Other forms of dyspnea: Secondary | ICD-10-CM | POA: Diagnosis not present

## 2018-03-02 DIAGNOSIS — I482 Chronic atrial fibrillation: Secondary | ICD-10-CM | POA: Diagnosis not present

## 2018-03-02 DIAGNOSIS — F17210 Nicotine dependence, cigarettes, uncomplicated: Secondary | ICD-10-CM | POA: Diagnosis not present

## 2018-03-02 DIAGNOSIS — E119 Type 2 diabetes mellitus without complications: Secondary | ICD-10-CM | POA: Diagnosis not present

## 2018-03-02 DIAGNOSIS — I2510 Atherosclerotic heart disease of native coronary artery without angina pectoris: Secondary | ICD-10-CM | POA: Diagnosis not present

## 2018-03-02 DIAGNOSIS — F1210 Cannabis abuse, uncomplicated: Secondary | ICD-10-CM | POA: Diagnosis not present

## 2018-03-02 DIAGNOSIS — I5023 Acute on chronic systolic (congestive) heart failure: Secondary | ICD-10-CM | POA: Diagnosis not present

## 2018-03-11 DIAGNOSIS — M869 Osteomyelitis, unspecified: Secondary | ICD-10-CM | POA: Diagnosis not present

## 2018-03-11 DIAGNOSIS — I998 Other disorder of circulatory system: Secondary | ICD-10-CM | POA: Diagnosis not present

## 2018-03-12 DIAGNOSIS — L97509 Non-pressure chronic ulcer of other part of unspecified foot with unspecified severity: Secondary | ICD-10-CM | POA: Diagnosis not present

## 2018-03-12 DIAGNOSIS — E11621 Type 2 diabetes mellitus with foot ulcer: Secondary | ICD-10-CM | POA: Diagnosis not present

## 2018-03-18 DIAGNOSIS — E1152 Type 2 diabetes mellitus with diabetic peripheral angiopathy with gangrene: Secondary | ICD-10-CM | POA: Diagnosis not present

## 2018-03-18 DIAGNOSIS — M86172 Other acute osteomyelitis, left ankle and foot: Secondary | ICD-10-CM | POA: Diagnosis not present

## 2018-03-18 DIAGNOSIS — I739 Peripheral vascular disease, unspecified: Secondary | ICD-10-CM | POA: Diagnosis not present

## 2018-03-25 DIAGNOSIS — I5022 Chronic systolic (congestive) heart failure: Secondary | ICD-10-CM

## 2018-03-25 DIAGNOSIS — I482 Chronic atrial fibrillation: Secondary | ICD-10-CM | POA: Diagnosis not present

## 2018-03-25 DIAGNOSIS — I4891 Unspecified atrial fibrillation: Secondary | ICD-10-CM | POA: Diagnosis not present

## 2018-03-25 DIAGNOSIS — Z7901 Long term (current) use of anticoagulants: Secondary | ICD-10-CM | POA: Insufficient documentation

## 2018-03-25 DIAGNOSIS — I5023 Acute on chronic systolic (congestive) heart failure: Secondary | ICD-10-CM

## 2018-03-25 DIAGNOSIS — I445 Left posterior fascicular block: Secondary | ICD-10-CM | POA: Diagnosis not present

## 2018-03-25 HISTORY — DX: Chronic systolic (congestive) heart failure: I50.22

## 2018-03-25 HISTORY — DX: Acute on chronic systolic (congestive) heart failure: I50.23

## 2018-03-25 HISTORY — DX: Long term (current) use of anticoagulants: Z79.01

## 2018-04-07 DIAGNOSIS — I482 Chronic atrial fibrillation: Secondary | ICD-10-CM | POA: Diagnosis not present

## 2018-04-07 DIAGNOSIS — I5022 Chronic systolic (congestive) heart failure: Secondary | ICD-10-CM | POA: Diagnosis not present

## 2018-04-09 DIAGNOSIS — Z79899 Other long term (current) drug therapy: Secondary | ICD-10-CM | POA: Diagnosis not present

## 2018-04-09 DIAGNOSIS — Z5181 Encounter for therapeutic drug level monitoring: Secondary | ICD-10-CM | POA: Diagnosis not present

## 2018-04-09 DIAGNOSIS — E11621 Type 2 diabetes mellitus with foot ulcer: Secondary | ICD-10-CM | POA: Diagnosis not present

## 2018-04-09 DIAGNOSIS — L97509 Non-pressure chronic ulcer of other part of unspecified foot with unspecified severity: Secondary | ICD-10-CM | POA: Diagnosis not present

## 2018-04-09 DIAGNOSIS — G894 Chronic pain syndrome: Secondary | ICD-10-CM | POA: Diagnosis not present

## 2018-04-13 DIAGNOSIS — I4891 Unspecified atrial fibrillation: Secondary | ICD-10-CM | POA: Diagnosis not present

## 2018-04-13 DIAGNOSIS — E1140 Type 2 diabetes mellitus with diabetic neuropathy, unspecified: Secondary | ICD-10-CM | POA: Diagnosis not present

## 2018-04-13 DIAGNOSIS — L851 Acquired keratosis [keratoderma] palmaris et plantaris: Secondary | ICD-10-CM | POA: Diagnosis not present

## 2018-04-13 DIAGNOSIS — I2510 Atherosclerotic heart disease of native coronary artery without angina pectoris: Secondary | ICD-10-CM | POA: Diagnosis not present

## 2018-04-13 DIAGNOSIS — F17210 Nicotine dependence, cigarettes, uncomplicated: Secondary | ICD-10-CM | POA: Diagnosis not present

## 2018-04-13 DIAGNOSIS — Z955 Presence of coronary angioplasty implant and graft: Secondary | ICD-10-CM | POA: Diagnosis not present

## 2018-04-13 DIAGNOSIS — I110 Hypertensive heart disease with heart failure: Secondary | ICD-10-CM | POA: Diagnosis not present

## 2018-04-13 DIAGNOSIS — G4730 Sleep apnea, unspecified: Secondary | ICD-10-CM | POA: Diagnosis not present

## 2018-04-13 DIAGNOSIS — L84 Corns and callosities: Secondary | ICD-10-CM | POA: Diagnosis not present

## 2018-04-13 DIAGNOSIS — I509 Heart failure, unspecified: Secondary | ICD-10-CM | POA: Diagnosis not present

## 2018-04-13 DIAGNOSIS — M1990 Unspecified osteoarthritis, unspecified site: Secondary | ICD-10-CM | POA: Diagnosis not present

## 2018-04-13 DIAGNOSIS — Z794 Long term (current) use of insulin: Secondary | ICD-10-CM | POA: Diagnosis not present

## 2018-04-13 DIAGNOSIS — I252 Old myocardial infarction: Secondary | ICD-10-CM | POA: Diagnosis not present

## 2018-04-14 ENCOUNTER — Other Ambulatory Visit: Payer: Self-pay

## 2018-04-14 DIAGNOSIS — I96 Gangrene, not elsewhere classified: Secondary | ICD-10-CM

## 2018-04-14 DIAGNOSIS — L97509 Non-pressure chronic ulcer of other part of unspecified foot with unspecified severity: Secondary | ICD-10-CM

## 2018-04-15 ENCOUNTER — Ambulatory Visit (INDEPENDENT_AMBULATORY_CARE_PROVIDER_SITE_OTHER): Payer: Medicare Other | Admitting: Vascular Surgery

## 2018-04-15 ENCOUNTER — Encounter: Payer: Self-pay | Admitting: Vascular Surgery

## 2018-04-15 ENCOUNTER — Ambulatory Visit (HOSPITAL_COMMUNITY)
Admission: RE | Admit: 2018-04-15 | Discharge: 2018-04-15 | Disposition: A | Discharge status: Home / Self Care | Payer: Medicare Other | Source: Ambulatory Visit | Attending: Vascular Surgery | Admitting: Vascular Surgery

## 2018-04-15 ENCOUNTER — Other Ambulatory Visit: Payer: Self-pay

## 2018-04-15 VITALS — BP 123/95 | HR 69 | Temp 97.8°F | Resp 20 | Ht 75.0 in | Wt 236.0 lb

## 2018-04-15 DIAGNOSIS — L97521 Non-pressure chronic ulcer of other part of left foot limited to breakdown of skin: Secondary | ICD-10-CM

## 2018-04-15 DIAGNOSIS — L97509 Non-pressure chronic ulcer of other part of unspecified foot with unspecified severity: Secondary | ICD-10-CM | POA: Diagnosis not present

## 2018-04-15 DIAGNOSIS — I96 Gangrene, not elsewhere classified: Secondary | ICD-10-CM | POA: Insufficient documentation

## 2018-04-15 NOTE — Progress Notes (Signed)
Patient is a 57 year old male who returns today for further evaluation for nonhealing wounds of his left foot.  He developed dry gangrenous changes of his toes bilaterally after a significant event October 2007 where he had a stroke and myocardial infarction with an A. fib embolic event.  He had a CT Angio of November 2017 which showed no significant arterial occlusive disease.  He was last seen by me January 2018 at that time also had no significant arterial occlusive disease.  He denies claudication.  [facility].  He is on warfarin for his atrial fibrillation.  Current medical problems include elevated cholesterol hypertension tonic atrial fibrillation all of which are currently stable.  No past medical history on file. Current Outpatient Medications on File Prior to Visit  Medication Sig Dispense Refill  . aspirin 81 MG chewable tablet Chew by mouth daily.    Marland Kitchen atorvastatin (LIPITOR) 80 MG tablet Take 80 mg by mouth.    . chlorproMAZINE (THORAZINE) 25 MG tablet     . cholecalciferol (VITAMIN D) 1000 units tablet Take 1,000 Units by mouth daily.    . collagenase (SANTYL) ointment Apply 1 application topically daily. To foot ulcers 30 g 1  . cyanocobalamin 500 MCG tablet Take by mouth.    . enalapril (VASOTEC) 10 MG tablet Take 10 mg by mouth.    . furosemide (LASIX) 40 MG tablet Take 40 mg by mouth.    Marland Kitchen lisinopril (PRINIVIL,ZESTRIL) 10 MG tablet     . metoprolol (LOPRESSOR) 50 MG tablet Take 50 mg by mouth 2 (two) times daily.    . metoprolol succinate (TOPROL-XL) 25 MG 24 hr tablet     . metoprolol succinate (TOPROL-XL) 50 MG 24 hr tablet Take 50 mg by mouth.    . pantoprazole (PROTONIX) 40 MG tablet     . PARoxetine (PAXIL) 40 MG tablet Take 40 mg by mouth.    . spironolactone (ALDACTONE) 50 MG tablet Take 50 mg by mouth.    . vitamin B-12 (CYANOCOBALAMIN) 500 MCG tablet Take 500 mcg by mouth daily.    Marland Kitchen warfarin (COUMADIN) 5 MG tablet     .  lisinopril (PRINIVIL,ZESTRIL) 10 MG tablet Take by mouth.     No current facility-administered medications on file prior to visit.    Review of systems: He denies shortness of breath.  He denies chest pain.  Physical exam:  Vitals:   04/15/18 1118  BP: (!) 123/95  Pulse: 69  Resp: 20  Temp: 97.8 F (36.6 C)  TempSrc: Oral  SpO2: 99%  Weight: 236 lb (107 kg)  Height: 6\' 3"  (1.905 m)    Extremities: Partial toe amputation right first toe well-healed no ulcerations on the right foot, left foot ulceration tip of left first toe less than 1 mm depth 6 mm length less than 1 mm with, ulceration tip of toe for left foot less than 1 mm depth 4 mm diameter  Vascular: 1+ dorsalis pedis and posterior tibial pulses bilaterally mild pretibial and pedal edema  Data: Patient had bilateral ABIs performed today which were triphasic greater than 1 and normal bilaterally.  Assessment: Patient with persistent but very superficial wounds left foot with no significant large vessel occlusive disease most likely sustained digital emboli during his embolic event and sustained critical illness several years ago.  Plan: I offered the patient amputation of the tip of each of those toes for pain control.  However he states  that currently the pain is not significant enough to require that.  Otherwise I would continue local wound care follow-up with the  wound center.  No significant arterial occlusive disease that would require revascularization from my standpoint.  The patient will follow-up on an as-needed basis.  Fabienne Bruns, MD [facility] Office: [phone] Pager: [phone]

## 2018-05-10 DIAGNOSIS — Z79899 Other long term (current) drug therapy: Secondary | ICD-10-CM | POA: Diagnosis not present

## 2018-05-10 DIAGNOSIS — L97509 Non-pressure chronic ulcer of other part of unspecified foot with unspecified severity: Secondary | ICD-10-CM | POA: Diagnosis not present

## 2018-05-10 DIAGNOSIS — E11621 Type 2 diabetes mellitus with foot ulcer: Secondary | ICD-10-CM | POA: Diagnosis not present

## 2018-05-10 DIAGNOSIS — Z5181 Encounter for therapeutic drug level monitoring: Secondary | ICD-10-CM | POA: Diagnosis not present

## 2018-05-18 DIAGNOSIS — R0609 Other forms of dyspnea: Secondary | ICD-10-CM | POA: Diagnosis not present

## 2018-05-18 DIAGNOSIS — I200 Unstable angina: Secondary | ICD-10-CM

## 2018-05-18 DIAGNOSIS — I5022 Chronic systolic (congestive) heart failure: Secondary | ICD-10-CM | POA: Diagnosis not present

## 2018-05-18 DIAGNOSIS — K219 Gastro-esophageal reflux disease without esophagitis: Secondary | ICD-10-CM | POA: Diagnosis not present

## 2018-05-18 DIAGNOSIS — F17200 Nicotine dependence, unspecified, uncomplicated: Secondary | ICD-10-CM | POA: Diagnosis not present

## 2018-05-18 DIAGNOSIS — I2510 Atherosclerotic heart disease of native coronary artery without angina pectoris: Secondary | ICD-10-CM | POA: Diagnosis not present

## 2018-05-18 DIAGNOSIS — E119 Type 2 diabetes mellitus without complications: Secondary | ICD-10-CM | POA: Diagnosis not present

## 2018-05-18 DIAGNOSIS — I110 Hypertensive heart disease with heart failure: Secondary | ICD-10-CM | POA: Diagnosis not present

## 2018-05-18 DIAGNOSIS — I482 Chronic atrial fibrillation: Secondary | ICD-10-CM | POA: Diagnosis not present

## 2018-05-18 DIAGNOSIS — I252 Old myocardial infarction: Secondary | ICD-10-CM | POA: Diagnosis not present

## 2018-05-18 DIAGNOSIS — I10 Essential (primary) hypertension: Secondary | ICD-10-CM | POA: Diagnosis not present

## 2018-05-18 DIAGNOSIS — Z79899 Other long term (current) drug therapy: Secondary | ICD-10-CM | POA: Diagnosis not present

## 2018-05-18 DIAGNOSIS — Z7901 Long term (current) use of anticoagulants: Secondary | ICD-10-CM | POA: Diagnosis not present

## 2018-05-18 DIAGNOSIS — R0602 Shortness of breath: Secondary | ICD-10-CM | POA: Diagnosis not present

## 2018-05-18 DIAGNOSIS — I509 Heart failure, unspecified: Secondary | ICD-10-CM | POA: Diagnosis not present

## 2018-05-18 DIAGNOSIS — F17210 Nicotine dependence, cigarettes, uncomplicated: Secondary | ICD-10-CM | POA: Diagnosis not present

## 2018-05-18 DIAGNOSIS — E7800 Pure hypercholesterolemia, unspecified: Secondary | ICD-10-CM | POA: Diagnosis not present

## 2018-05-18 DIAGNOSIS — I4891 Unspecified atrial fibrillation: Secondary | ICD-10-CM

## 2018-05-18 DIAGNOSIS — E871 Hypo-osmolality and hyponatremia: Secondary | ICD-10-CM

## 2018-05-19 DIAGNOSIS — E871 Hypo-osmolality and hyponatremia: Secondary | ICD-10-CM | POA: Diagnosis not present

## 2018-05-19 DIAGNOSIS — R0609 Other forms of dyspnea: Secondary | ICD-10-CM | POA: Diagnosis not present

## 2018-05-19 DIAGNOSIS — I4891 Unspecified atrial fibrillation: Secondary | ICD-10-CM | POA: Diagnosis not present

## 2018-05-19 DIAGNOSIS — R0602 Shortness of breath: Secondary | ICD-10-CM | POA: Diagnosis not present

## 2018-05-19 DIAGNOSIS — F17200 Nicotine dependence, unspecified, uncomplicated: Secondary | ICD-10-CM | POA: Diagnosis not present

## 2018-05-19 DIAGNOSIS — I509 Heart failure, unspecified: Secondary | ICD-10-CM | POA: Diagnosis not present

## 2018-05-19 DIAGNOSIS — I2510 Atherosclerotic heart disease of native coronary artery without angina pectoris: Secondary | ICD-10-CM | POA: Diagnosis not present

## 2018-05-19 DIAGNOSIS — I200 Unstable angina: Secondary | ICD-10-CM | POA: Diagnosis not present

## 2018-05-26 DIAGNOSIS — F17210 Nicotine dependence, cigarettes, uncomplicated: Secondary | ICD-10-CM | POA: Diagnosis not present

## 2018-05-26 DIAGNOSIS — I110 Hypertensive heart disease with heart failure: Secondary | ICD-10-CM | POA: Diagnosis not present

## 2018-05-26 DIAGNOSIS — I4891 Unspecified atrial fibrillation: Secondary | ICD-10-CM | POA: Diagnosis not present

## 2018-05-26 DIAGNOSIS — I509 Heart failure, unspecified: Secondary | ICD-10-CM | POA: Diagnosis not present

## 2018-05-26 DIAGNOSIS — I252 Old myocardial infarction: Secondary | ICD-10-CM | POA: Diagnosis not present

## 2018-05-26 DIAGNOSIS — I2510 Atherosclerotic heart disease of native coronary artery without angina pectoris: Secondary | ICD-10-CM | POA: Diagnosis not present

## 2018-05-26 DIAGNOSIS — Z955 Presence of coronary angioplasty implant and graft: Secondary | ICD-10-CM | POA: Diagnosis not present

## 2018-05-26 DIAGNOSIS — E1140 Type 2 diabetes mellitus with diabetic neuropathy, unspecified: Secondary | ICD-10-CM | POA: Diagnosis not present

## 2018-05-26 DIAGNOSIS — L97522 Non-pressure chronic ulcer of other part of left foot with fat layer exposed: Secondary | ICD-10-CM | POA: Diagnosis not present

## 2018-05-26 DIAGNOSIS — L97422 Non-pressure chronic ulcer of left heel and midfoot with fat layer exposed: Secondary | ICD-10-CM | POA: Diagnosis not present

## 2018-05-26 DIAGNOSIS — M1990 Unspecified osteoarthritis, unspecified site: Secondary | ICD-10-CM | POA: Diagnosis not present

## 2018-05-26 DIAGNOSIS — E11621 Type 2 diabetes mellitus with foot ulcer: Secondary | ICD-10-CM | POA: Diagnosis not present

## 2018-05-26 DIAGNOSIS — G4730 Sleep apnea, unspecified: Secondary | ICD-10-CM | POA: Diagnosis not present

## 2018-05-26 DIAGNOSIS — Z794 Long term (current) use of insulin: Secondary | ICD-10-CM | POA: Diagnosis not present

## 2018-05-31 DIAGNOSIS — E11621 Type 2 diabetes mellitus with foot ulcer: Secondary | ICD-10-CM | POA: Diagnosis not present

## 2018-05-31 DIAGNOSIS — Z5181 Encounter for therapeutic drug level monitoring: Secondary | ICD-10-CM | POA: Diagnosis not present

## 2018-05-31 DIAGNOSIS — R0600 Dyspnea, unspecified: Secondary | ICD-10-CM | POA: Diagnosis not present

## 2018-05-31 DIAGNOSIS — Z79899 Other long term (current) drug therapy: Secondary | ICD-10-CM | POA: Diagnosis not present

## 2018-05-31 DIAGNOSIS — L97509 Non-pressure chronic ulcer of other part of unspecified foot with unspecified severity: Secondary | ICD-10-CM | POA: Diagnosis not present

## 2018-06-02 DIAGNOSIS — Z09 Encounter for follow-up examination after completed treatment for conditions other than malignant neoplasm: Secondary | ICD-10-CM | POA: Diagnosis not present

## 2018-06-02 DIAGNOSIS — Z8631 Personal history of diabetic foot ulcer: Secondary | ICD-10-CM | POA: Diagnosis not present

## 2018-06-02 DIAGNOSIS — E11621 Type 2 diabetes mellitus with foot ulcer: Secondary | ICD-10-CM | POA: Diagnosis not present

## 2018-06-02 DIAGNOSIS — Z872 Personal history of diseases of the skin and subcutaneous tissue: Secondary | ICD-10-CM | POA: Diagnosis not present

## 2018-06-02 DIAGNOSIS — L97529 Non-pressure chronic ulcer of other part of left foot with unspecified severity: Secondary | ICD-10-CM | POA: Diagnosis not present

## 2018-06-03 ENCOUNTER — Ambulatory Visit: Payer: Medicare Other | Admitting: Vascular Surgery

## 2018-06-03 ENCOUNTER — Encounter (HOSPITAL_COMMUNITY): Payer: Medicare Other

## 2018-07-07 DIAGNOSIS — I5022 Chronic systolic (congestive) heart failure: Secondary | ICD-10-CM | POA: Diagnosis not present

## 2018-11-08 DIAGNOSIS — A630 Anogenital (venereal) warts: Secondary | ICD-10-CM | POA: Diagnosis not present

## 2018-11-08 DIAGNOSIS — E119 Type 2 diabetes mellitus without complications: Secondary | ICD-10-CM | POA: Diagnosis not present

## 2018-12-31 DIAGNOSIS — A630 Anogenital (venereal) warts: Secondary | ICD-10-CM | POA: Diagnosis not present

## 2018-12-31 DIAGNOSIS — E1140 Type 2 diabetes mellitus with diabetic neuropathy, unspecified: Secondary | ICD-10-CM | POA: Diagnosis not present

## 2019-02-14 DIAGNOSIS — L97519 Non-pressure chronic ulcer of other part of right foot with unspecified severity: Secondary | ICD-10-CM | POA: Diagnosis not present

## 2019-02-14 DIAGNOSIS — E11621 Type 2 diabetes mellitus with foot ulcer: Secondary | ICD-10-CM | POA: Diagnosis not present

## 2019-02-14 DIAGNOSIS — E119 Type 2 diabetes mellitus without complications: Secondary | ICD-10-CM | POA: Diagnosis not present

## 2019-02-14 DIAGNOSIS — L97509 Non-pressure chronic ulcer of other part of unspecified foot with unspecified severity: Secondary | ICD-10-CM | POA: Diagnosis not present

## 2019-03-03 DIAGNOSIS — E11621 Type 2 diabetes mellitus with foot ulcer: Secondary | ICD-10-CM | POA: Diagnosis not present

## 2019-03-03 DIAGNOSIS — L97509 Non-pressure chronic ulcer of other part of unspecified foot with unspecified severity: Secondary | ICD-10-CM | POA: Diagnosis not present

## 2019-04-06 DIAGNOSIS — A630 Anogenital (venereal) warts: Secondary | ICD-10-CM | POA: Diagnosis not present

## 2019-05-04 DIAGNOSIS — A630 Anogenital (venereal) warts: Secondary | ICD-10-CM | POA: Diagnosis not present

## 2019-06-02 DIAGNOSIS — E1165 Type 2 diabetes mellitus with hyperglycemia: Secondary | ICD-10-CM | POA: Diagnosis not present

## 2019-06-02 DIAGNOSIS — I4891 Unspecified atrial fibrillation: Secondary | ICD-10-CM | POA: Diagnosis not present

## 2019-06-08 DIAGNOSIS — B079 Viral wart, unspecified: Secondary | ICD-10-CM | POA: Diagnosis not present

## 2019-06-14 DIAGNOSIS — M7989 Other specified soft tissue disorders: Secondary | ICD-10-CM | POA: Diagnosis not present

## 2019-06-20 DIAGNOSIS — E11621 Type 2 diabetes mellitus with foot ulcer: Secondary | ICD-10-CM | POA: Diagnosis not present

## 2019-06-20 DIAGNOSIS — L97509 Non-pressure chronic ulcer of other part of unspecified foot with unspecified severity: Secondary | ICD-10-CM | POA: Diagnosis not present

## 2019-06-21 DIAGNOSIS — F17210 Nicotine dependence, cigarettes, uncomplicated: Secondary | ICD-10-CM | POA: Diagnosis not present

## 2019-06-21 DIAGNOSIS — G4730 Sleep apnea, unspecified: Secondary | ICD-10-CM | POA: Diagnosis not present

## 2019-06-21 DIAGNOSIS — I252 Old myocardial infarction: Secondary | ICD-10-CM | POA: Diagnosis not present

## 2019-06-21 DIAGNOSIS — G629 Polyneuropathy, unspecified: Secondary | ICD-10-CM | POA: Diagnosis not present

## 2019-06-21 DIAGNOSIS — I4891 Unspecified atrial fibrillation: Secondary | ICD-10-CM | POA: Diagnosis not present

## 2019-06-21 DIAGNOSIS — L97529 Non-pressure chronic ulcer of other part of left foot with unspecified severity: Secondary | ICD-10-CM | POA: Diagnosis not present

## 2019-06-21 DIAGNOSIS — M1990 Unspecified osteoarthritis, unspecified site: Secondary | ICD-10-CM | POA: Diagnosis not present

## 2019-06-21 DIAGNOSIS — I10 Essential (primary) hypertension: Secondary | ICD-10-CM | POA: Diagnosis not present

## 2019-06-21 DIAGNOSIS — Z794 Long term (current) use of insulin: Secondary | ICD-10-CM | POA: Diagnosis not present

## 2019-06-21 DIAGNOSIS — L97512 Non-pressure chronic ulcer of other part of right foot with fat layer exposed: Secondary | ICD-10-CM | POA: Diagnosis not present

## 2019-06-21 DIAGNOSIS — E11621 Type 2 diabetes mellitus with foot ulcer: Secondary | ICD-10-CM | POA: Diagnosis not present

## 2019-06-21 DIAGNOSIS — I739 Peripheral vascular disease, unspecified: Secondary | ICD-10-CM | POA: Diagnosis not present

## 2019-06-21 DIAGNOSIS — I2510 Atherosclerotic heart disease of native coronary artery without angina pectoris: Secondary | ICD-10-CM | POA: Diagnosis not present

## 2019-06-27 DIAGNOSIS — L97512 Non-pressure chronic ulcer of other part of right foot with fat layer exposed: Secondary | ICD-10-CM | POA: Diagnosis not present

## 2019-06-27 DIAGNOSIS — S91301A Unspecified open wound, right foot, initial encounter: Secondary | ICD-10-CM | POA: Diagnosis not present

## 2019-06-27 DIAGNOSIS — E11621 Type 2 diabetes mellitus with foot ulcer: Secondary | ICD-10-CM | POA: Diagnosis not present

## 2019-07-06 DIAGNOSIS — A630 Anogenital (venereal) warts: Secondary | ICD-10-CM | POA: Diagnosis not present

## 2019-07-07 DIAGNOSIS — L97512 Non-pressure chronic ulcer of other part of right foot with fat layer exposed: Secondary | ICD-10-CM | POA: Diagnosis not present

## 2019-07-07 DIAGNOSIS — E11621 Type 2 diabetes mellitus with foot ulcer: Secondary | ICD-10-CM | POA: Diagnosis not present

## 2019-07-14 DIAGNOSIS — E11621 Type 2 diabetes mellitus with foot ulcer: Secondary | ICD-10-CM | POA: Diagnosis not present

## 2019-07-14 DIAGNOSIS — T8189XA Other complications of procedures, not elsewhere classified, initial encounter: Secondary | ICD-10-CM | POA: Diagnosis not present

## 2019-07-14 DIAGNOSIS — L97512 Non-pressure chronic ulcer of other part of right foot with fat layer exposed: Secondary | ICD-10-CM | POA: Diagnosis not present

## 2019-07-26 DIAGNOSIS — L97512 Non-pressure chronic ulcer of other part of right foot with fat layer exposed: Secondary | ICD-10-CM | POA: Diagnosis not present

## 2019-07-26 DIAGNOSIS — E11621 Type 2 diabetes mellitus with foot ulcer: Secondary | ICD-10-CM | POA: Diagnosis not present

## 2019-08-03 DIAGNOSIS — E11621 Type 2 diabetes mellitus with foot ulcer: Secondary | ICD-10-CM | POA: Diagnosis not present

## 2019-08-03 DIAGNOSIS — L97512 Non-pressure chronic ulcer of other part of right foot with fat layer exposed: Secondary | ICD-10-CM | POA: Diagnosis not present

## 2019-08-10 DIAGNOSIS — S91301A Unspecified open wound, right foot, initial encounter: Secondary | ICD-10-CM | POA: Diagnosis not present

## 2019-08-10 DIAGNOSIS — L97519 Non-pressure chronic ulcer of other part of right foot with unspecified severity: Secondary | ICD-10-CM | POA: Diagnosis not present

## 2019-09-13 DIAGNOSIS — E7800 Pure hypercholesterolemia, unspecified: Secondary | ICD-10-CM | POA: Diagnosis not present

## 2019-09-13 DIAGNOSIS — Z1389 Encounter for screening for other disorder: Secondary | ICD-10-CM | POA: Diagnosis not present

## 2019-09-13 DIAGNOSIS — F330 Major depressive disorder, recurrent, mild: Secondary | ICD-10-CM | POA: Diagnosis not present

## 2019-09-13 DIAGNOSIS — K219 Gastro-esophageal reflux disease without esophagitis: Secondary | ICD-10-CM | POA: Diagnosis not present

## 2019-09-13 DIAGNOSIS — E1140 Type 2 diabetes mellitus with diabetic neuropathy, unspecified: Secondary | ICD-10-CM | POA: Diagnosis not present

## 2019-09-13 DIAGNOSIS — R3911 Hesitancy of micturition: Secondary | ICD-10-CM | POA: Diagnosis not present

## 2019-09-13 DIAGNOSIS — I4891 Unspecified atrial fibrillation: Secondary | ICD-10-CM | POA: Diagnosis not present

## 2019-09-13 DIAGNOSIS — Z8631 Personal history of diabetic foot ulcer: Secondary | ICD-10-CM | POA: Diagnosis not present

## 2019-09-13 DIAGNOSIS — I420 Dilated cardiomyopathy: Secondary | ICD-10-CM | POA: Diagnosis not present

## 2019-09-13 DIAGNOSIS — Z6827 Body mass index (BMI) 27.0-27.9, adult: Secondary | ICD-10-CM | POA: Diagnosis not present

## 2019-09-13 DIAGNOSIS — I10 Essential (primary) hypertension: Secondary | ICD-10-CM | POA: Diagnosis not present

## 2019-09-13 DIAGNOSIS — G894 Chronic pain syndrome: Secondary | ICD-10-CM | POA: Diagnosis not present

## 2019-09-13 DIAGNOSIS — I679 Cerebrovascular disease, unspecified: Secondary | ICD-10-CM | POA: Diagnosis not present

## 2019-12-05 DIAGNOSIS — M86171 Other acute osteomyelitis, right ankle and foot: Secondary | ICD-10-CM | POA: Diagnosis not present

## 2019-12-05 DIAGNOSIS — M869 Osteomyelitis, unspecified: Secondary | ICD-10-CM | POA: Diagnosis not present

## 2019-12-08 DIAGNOSIS — L97512 Non-pressure chronic ulcer of other part of right foot with fat layer exposed: Secondary | ICD-10-CM | POA: Diagnosis not present

## 2019-12-08 DIAGNOSIS — M86171 Other acute osteomyelitis, right ankle and foot: Secondary | ICD-10-CM | POA: Diagnosis not present

## 2019-12-08 DIAGNOSIS — E11621 Type 2 diabetes mellitus with foot ulcer: Secondary | ICD-10-CM | POA: Diagnosis not present

## 2019-12-14 DIAGNOSIS — E11621 Type 2 diabetes mellitus with foot ulcer: Secondary | ICD-10-CM | POA: Diagnosis not present

## 2019-12-14 DIAGNOSIS — L97512 Non-pressure chronic ulcer of other part of right foot with fat layer exposed: Secondary | ICD-10-CM | POA: Diagnosis not present

## 2019-12-22 DIAGNOSIS — L97512 Non-pressure chronic ulcer of other part of right foot with fat layer exposed: Secondary | ICD-10-CM | POA: Diagnosis not present

## 2019-12-22 DIAGNOSIS — E11621 Type 2 diabetes mellitus with foot ulcer: Secondary | ICD-10-CM | POA: Diagnosis not present

## 2020-01-02 DIAGNOSIS — E11621 Type 2 diabetes mellitus with foot ulcer: Secondary | ICD-10-CM | POA: Diagnosis not present

## 2020-01-02 DIAGNOSIS — L97512 Non-pressure chronic ulcer of other part of right foot with fat layer exposed: Secondary | ICD-10-CM | POA: Diagnosis not present

## 2020-01-18 DIAGNOSIS — Z7902 Long term (current) use of antithrombotics/antiplatelets: Secondary | ICD-10-CM | POA: Diagnosis not present

## 2020-01-18 DIAGNOSIS — Z7984 Long term (current) use of oral hypoglycemic drugs: Secondary | ICD-10-CM | POA: Diagnosis not present

## 2020-01-18 DIAGNOSIS — F17200 Nicotine dependence, unspecified, uncomplicated: Secondary | ICD-10-CM | POA: Diagnosis not present

## 2020-01-18 DIAGNOSIS — J9601 Acute respiratory failure with hypoxia: Secondary | ICD-10-CM | POA: Diagnosis present

## 2020-01-18 DIAGNOSIS — E1165 Type 2 diabetes mellitus with hyperglycemia: Secondary | ICD-10-CM | POA: Diagnosis not present

## 2020-01-18 DIAGNOSIS — E119 Type 2 diabetes mellitus without complications: Secondary | ICD-10-CM | POA: Diagnosis present

## 2020-01-18 DIAGNOSIS — I509 Heart failure, unspecified: Secondary | ICD-10-CM | POA: Diagnosis not present

## 2020-01-18 DIAGNOSIS — Z8673 Personal history of transient ischemic attack (TIA), and cerebral infarction without residual deficits: Secondary | ICD-10-CM | POA: Diagnosis not present

## 2020-01-18 DIAGNOSIS — I482 Chronic atrial fibrillation, unspecified: Secondary | ICD-10-CM | POA: Diagnosis not present

## 2020-01-18 DIAGNOSIS — F17210 Nicotine dependence, cigarettes, uncomplicated: Secondary | ICD-10-CM | POA: Diagnosis present

## 2020-01-18 DIAGNOSIS — Z955 Presence of coronary angioplasty implant and graft: Secondary | ICD-10-CM | POA: Diagnosis not present

## 2020-01-18 DIAGNOSIS — E785 Hyperlipidemia, unspecified: Secondary | ICD-10-CM | POA: Diagnosis present

## 2020-01-18 DIAGNOSIS — I4891 Unspecified atrial fibrillation: Secondary | ICD-10-CM | POA: Diagnosis not present

## 2020-01-18 DIAGNOSIS — Z79899 Other long term (current) drug therapy: Secondary | ICD-10-CM | POA: Diagnosis not present

## 2020-01-18 DIAGNOSIS — R0602 Shortness of breath: Secondary | ICD-10-CM | POA: Diagnosis not present

## 2020-01-18 DIAGNOSIS — I5023 Acute on chronic systolic (congestive) heart failure: Secondary | ICD-10-CM | POA: Diagnosis present

## 2020-01-18 DIAGNOSIS — Z885 Allergy status to narcotic agent status: Secondary | ICD-10-CM | POA: Diagnosis not present

## 2020-01-18 DIAGNOSIS — I110 Hypertensive heart disease with heart failure: Secondary | ICD-10-CM | POA: Diagnosis not present

## 2020-01-18 DIAGNOSIS — I252 Old myocardial infarction: Secondary | ICD-10-CM | POA: Diagnosis not present

## 2020-01-18 DIAGNOSIS — I2510 Atherosclerotic heart disease of native coronary artery without angina pectoris: Secondary | ICD-10-CM | POA: Diagnosis present

## 2020-01-18 DIAGNOSIS — Z9119 Patient's noncompliance with other medical treatment and regimen: Secondary | ICD-10-CM | POA: Diagnosis not present

## 2020-01-18 DIAGNOSIS — I340 Nonrheumatic mitral (valve) insufficiency: Secondary | ICD-10-CM | POA: Diagnosis not present

## 2020-01-18 DIAGNOSIS — I361 Nonrheumatic tricuspid (valve) insufficiency: Secondary | ICD-10-CM | POA: Diagnosis not present

## 2020-01-18 DIAGNOSIS — R778 Other specified abnormalities of plasma proteins: Secondary | ICD-10-CM | POA: Diagnosis present

## 2020-01-18 DIAGNOSIS — I429 Cardiomyopathy, unspecified: Secondary | ICD-10-CM | POA: Diagnosis present

## 2020-01-19 DIAGNOSIS — I2510 Atherosclerotic heart disease of native coronary artery without angina pectoris: Secondary | ICD-10-CM | POA: Diagnosis not present

## 2020-01-19 DIAGNOSIS — I4891 Unspecified atrial fibrillation: Secondary | ICD-10-CM | POA: Diagnosis not present

## 2020-01-19 DIAGNOSIS — I509 Heart failure, unspecified: Secondary | ICD-10-CM | POA: Diagnosis not present

## 2020-01-19 DIAGNOSIS — I361 Nonrheumatic tricuspid (valve) insufficiency: Secondary | ICD-10-CM

## 2020-01-19 DIAGNOSIS — J9601 Acute respiratory failure with hypoxia: Secondary | ICD-10-CM | POA: Diagnosis not present

## 2020-01-19 DIAGNOSIS — I340 Nonrheumatic mitral (valve) insufficiency: Secondary | ICD-10-CM

## 2020-01-20 DIAGNOSIS — I4891 Unspecified atrial fibrillation: Secondary | ICD-10-CM

## 2020-01-20 DIAGNOSIS — I2510 Atherosclerotic heart disease of native coronary artery without angina pectoris: Secondary | ICD-10-CM | POA: Diagnosis not present

## 2020-01-20 DIAGNOSIS — J9601 Acute respiratory failure with hypoxia: Secondary | ICD-10-CM | POA: Diagnosis not present

## 2020-01-20 DIAGNOSIS — E785 Hyperlipidemia, unspecified: Secondary | ICD-10-CM | POA: Diagnosis not present

## 2020-01-20 DIAGNOSIS — I509 Heart failure, unspecified: Secondary | ICD-10-CM

## 2020-01-21 DIAGNOSIS — I2510 Atherosclerotic heart disease of native coronary artery without angina pectoris: Secondary | ICD-10-CM

## 2020-01-21 DIAGNOSIS — J9601 Acute respiratory failure with hypoxia: Secondary | ICD-10-CM | POA: Diagnosis not present

## 2020-01-21 DIAGNOSIS — F17200 Nicotine dependence, unspecified, uncomplicated: Secondary | ICD-10-CM

## 2020-01-21 DIAGNOSIS — I4891 Unspecified atrial fibrillation: Secondary | ICD-10-CM

## 2020-01-21 DIAGNOSIS — I509 Heart failure, unspecified: Secondary | ICD-10-CM

## 2020-01-21 DIAGNOSIS — Z9119 Patient's noncompliance with other medical treatment and regimen: Secondary | ICD-10-CM

## 2020-01-21 DIAGNOSIS — I429 Cardiomyopathy, unspecified: Secondary | ICD-10-CM | POA: Diagnosis not present

## 2020-01-22 DIAGNOSIS — J9601 Acute respiratory failure with hypoxia: Secondary | ICD-10-CM | POA: Diagnosis not present

## 2020-01-22 DIAGNOSIS — I509 Heart failure, unspecified: Secondary | ICD-10-CM | POA: Diagnosis not present

## 2020-01-22 DIAGNOSIS — I4891 Unspecified atrial fibrillation: Secondary | ICD-10-CM | POA: Diagnosis not present

## 2020-01-22 DIAGNOSIS — I2510 Atherosclerotic heart disease of native coronary artery without angina pectoris: Secondary | ICD-10-CM | POA: Diagnosis not present

## 2020-01-22 DIAGNOSIS — I429 Cardiomyopathy, unspecified: Secondary | ICD-10-CM | POA: Diagnosis not present

## 2020-01-22 DIAGNOSIS — Z9119 Patient's noncompliance with other medical treatment and regimen: Secondary | ICD-10-CM | POA: Diagnosis not present

## 2020-01-24 ENCOUNTER — Other Ambulatory Visit: Payer: Self-pay

## 2020-01-24 NOTE — Patient Outreach (Signed)
New referral: Discharged from [facility] for CHF on 01/22/2020  Placed call to telephone number in chart. This number is disconnected.  Reviewed contact phone numbers in [facility] medical record and called [phone].  A lady answered the phone and states that he does not live with her and he does not have a phone. I left my name and contact information and ask this lady to have him call me. Also confirmed his mailing address.   PLAN: will mail unsuccessful letter and attempt again in 4 days due to holiday and office being closed.  Rowe Pavy, RN, BSN, CEN [facility] [phone]

## 2020-01-30 ENCOUNTER — Other Ambulatory Visit: Payer: Self-pay

## 2020-01-30 DIAGNOSIS — L97519 Non-pressure chronic ulcer of other part of right foot with unspecified severity: Secondary | ICD-10-CM | POA: Diagnosis not present

## 2020-01-30 DIAGNOSIS — E11621 Type 2 diabetes mellitus with foot ulcer: Secondary | ICD-10-CM | POA: Diagnosis not present

## 2020-01-30 NOTE — Patient Outreach (Signed)
Telephone assessment:  2nd outreach attempt to reach patient was unsuccessful.   PLAN: will attempt again in 3 days. Letter already mailed.  Rowe Pavy, RN, BSN, CEN [facility] [employer] [phone]

## 2020-02-02 ENCOUNTER — Other Ambulatory Visit: Payer: Self-pay

## 2020-02-02 NOTE — Patient Outreach (Signed)
Telephone assessment:  3rd unsuccessful call to patient.  Placed call to patient with no answer.   PLAN: will plan case closure on 02/07/2020 if no call back or response from letter.  Rowe Pavy, RN, BSN, CEN [facility] [employer] [phone]

## 2020-02-07 ENCOUNTER — Other Ambulatory Visit: Payer: Self-pay

## 2020-02-07 NOTE — Patient Outreach (Signed)
[facility]) Care Management  02/07/2020  RAEVON BROOM 08-14-1961 [id number]   Referral:  Case closed due to inability to reach patient via phone or letter. No response to outreach attempts.  PLAN: close case as unable to reach Rowe Pavy, Charity fundraiser, Scientist, research (physical sciences), [employer] [facility] [phone]

## 2020-03-05 DIAGNOSIS — R0602 Shortness of breath: Secondary | ICD-10-CM | POA: Diagnosis not present

## 2020-03-05 DIAGNOSIS — I509 Heart failure, unspecified: Secondary | ICD-10-CM | POA: Diagnosis not present

## 2020-03-05 DIAGNOSIS — E119 Type 2 diabetes mellitus without complications: Secondary | ICD-10-CM | POA: Diagnosis not present

## 2020-03-05 DIAGNOSIS — I5043 Acute on chronic combined systolic (congestive) and diastolic (congestive) heart failure: Secondary | ICD-10-CM | POA: Diagnosis not present

## 2020-03-05 DIAGNOSIS — E785 Hyperlipidemia, unspecified: Secondary | ICD-10-CM | POA: Diagnosis not present

## 2020-03-05 DIAGNOSIS — I110 Hypertensive heart disease with heart failure: Secondary | ICD-10-CM | POA: Diagnosis not present

## 2020-03-06 DIAGNOSIS — E785 Hyperlipidemia, unspecified: Secondary | ICD-10-CM | POA: Diagnosis not present

## 2020-03-06 DIAGNOSIS — I5043 Acute on chronic combined systolic (congestive) and diastolic (congestive) heart failure: Secondary | ICD-10-CM | POA: Diagnosis not present

## 2020-03-06 DIAGNOSIS — E119 Type 2 diabetes mellitus without complications: Secondary | ICD-10-CM | POA: Diagnosis not present

## 2020-03-07 DIAGNOSIS — E119 Type 2 diabetes mellitus without complications: Secondary | ICD-10-CM | POA: Diagnosis not present

## 2020-03-07 DIAGNOSIS — I5043 Acute on chronic combined systolic (congestive) and diastolic (congestive) heart failure: Secondary | ICD-10-CM | POA: Diagnosis not present

## 2020-03-07 DIAGNOSIS — E785 Hyperlipidemia, unspecified: Secondary | ICD-10-CM | POA: Diagnosis not present

## 2020-03-19 DIAGNOSIS — R0609 Other forms of dyspnea: Secondary | ICD-10-CM | POA: Diagnosis not present

## 2020-03-19 DIAGNOSIS — I110 Hypertensive heart disease with heart failure: Secondary | ICD-10-CM | POA: Diagnosis not present

## 2020-03-19 DIAGNOSIS — I509 Heart failure, unspecified: Secondary | ICD-10-CM | POA: Diagnosis not present

## 2020-03-19 DIAGNOSIS — E785 Hyperlipidemia, unspecified: Secondary | ICD-10-CM | POA: Diagnosis not present

## 2020-03-19 DIAGNOSIS — I2510 Atherosclerotic heart disease of native coronary artery without angina pectoris: Secondary | ICD-10-CM | POA: Diagnosis not present

## 2020-03-19 DIAGNOSIS — R0602 Shortness of breath: Secondary | ICD-10-CM | POA: Diagnosis not present

## 2020-03-20 DIAGNOSIS — I509 Heart failure, unspecified: Secondary | ICD-10-CM | POA: Diagnosis not present

## 2020-03-20 DIAGNOSIS — R0609 Other forms of dyspnea: Secondary | ICD-10-CM | POA: Diagnosis not present

## 2020-03-20 DIAGNOSIS — I2510 Atherosclerotic heart disease of native coronary artery without angina pectoris: Secondary | ICD-10-CM | POA: Diagnosis not present

## 2020-03-20 DIAGNOSIS — E785 Hyperlipidemia, unspecified: Secondary | ICD-10-CM | POA: Diagnosis not present

## 2020-03-21 DIAGNOSIS — R0609 Other forms of dyspnea: Secondary | ICD-10-CM | POA: Diagnosis not present

## 2020-03-21 DIAGNOSIS — I509 Heart failure, unspecified: Secondary | ICD-10-CM | POA: Diagnosis not present

## 2020-03-21 DIAGNOSIS — I2510 Atherosclerotic heart disease of native coronary artery without angina pectoris: Secondary | ICD-10-CM | POA: Diagnosis not present

## 2020-03-21 DIAGNOSIS — E785 Hyperlipidemia, unspecified: Secondary | ICD-10-CM | POA: Diagnosis not present

## 2020-03-22 DIAGNOSIS — I509 Heart failure, unspecified: Secondary | ICD-10-CM | POA: Diagnosis not present

## 2020-03-22 DIAGNOSIS — I2510 Atherosclerotic heart disease of native coronary artery without angina pectoris: Secondary | ICD-10-CM | POA: Diagnosis not present

## 2020-03-22 DIAGNOSIS — E785 Hyperlipidemia, unspecified: Secondary | ICD-10-CM | POA: Diagnosis not present

## 2020-03-22 DIAGNOSIS — R0609 Other forms of dyspnea: Secondary | ICD-10-CM | POA: Diagnosis not present

## 2020-03-28 DIAGNOSIS — I5022 Chronic systolic (congestive) heart failure: Secondary | ICD-10-CM | POA: Diagnosis not present

## 2020-05-17 DIAGNOSIS — E860 Dehydration: Secondary | ICD-10-CM | POA: Diagnosis not present

## 2020-05-17 DIAGNOSIS — R0602 Shortness of breath: Secondary | ICD-10-CM | POA: Diagnosis not present

## 2020-05-17 DIAGNOSIS — E872 Acidosis: Secondary | ICD-10-CM | POA: Diagnosis not present

## 2020-05-17 DIAGNOSIS — R0600 Dyspnea, unspecified: Secondary | ICD-10-CM | POA: Diagnosis not present

## 2020-05-17 DIAGNOSIS — M5489 Other dorsalgia: Secondary | ICD-10-CM | POA: Diagnosis not present

## 2020-05-17 DIAGNOSIS — R52 Pain, unspecified: Secondary | ICD-10-CM | POA: Diagnosis not present

## 2020-05-17 DIAGNOSIS — R069 Unspecified abnormalities of breathing: Secondary | ICD-10-CM | POA: Diagnosis not present

## 2020-05-17 DIAGNOSIS — R0902 Hypoxemia: Secondary | ICD-10-CM | POA: Diagnosis not present

## 2020-05-17 DIAGNOSIS — I4891 Unspecified atrial fibrillation: Secondary | ICD-10-CM | POA: Diagnosis not present

## 2020-05-17 DIAGNOSIS — I517 Cardiomegaly: Secondary | ICD-10-CM | POA: Diagnosis not present

## 2020-05-18 DIAGNOSIS — K828 Other specified diseases of gallbladder: Secondary | ICD-10-CM | POA: Diagnosis not present

## 2020-05-18 DIAGNOSIS — K819 Cholecystitis, unspecified: Secondary | ICD-10-CM | POA: Diagnosis not present

## 2020-05-18 DIAGNOSIS — E871 Hypo-osmolality and hyponatremia: Secondary | ICD-10-CM | POA: Diagnosis not present

## 2020-05-18 DIAGNOSIS — Q600 Renal agenesis, unilateral: Secondary | ICD-10-CM | POA: Diagnosis not present

## 2020-05-18 DIAGNOSIS — I482 Chronic atrial fibrillation, unspecified: Secondary | ICD-10-CM | POA: Diagnosis not present

## 2020-05-18 DIAGNOSIS — K7689 Other specified diseases of liver: Secondary | ICD-10-CM | POA: Diagnosis not present

## 2020-05-18 DIAGNOSIS — I5022 Chronic systolic (congestive) heart failure: Secondary | ICD-10-CM | POA: Diagnosis not present

## 2020-05-19 DIAGNOSIS — E871 Hypo-osmolality and hyponatremia: Secondary | ICD-10-CM | POA: Diagnosis not present

## 2020-05-19 DIAGNOSIS — I482 Chronic atrial fibrillation, unspecified: Secondary | ICD-10-CM | POA: Diagnosis not present

## 2020-05-19 DIAGNOSIS — I5022 Chronic systolic (congestive) heart failure: Secondary | ICD-10-CM | POA: Diagnosis not present

## 2020-05-20 DIAGNOSIS — I5022 Chronic systolic (congestive) heart failure: Secondary | ICD-10-CM | POA: Diagnosis not present

## 2020-05-20 DIAGNOSIS — J90 Pleural effusion, not elsewhere classified: Secondary | ICD-10-CM | POA: Diagnosis not present

## 2020-05-20 DIAGNOSIS — I517 Cardiomegaly: Secondary | ICD-10-CM | POA: Diagnosis not present

## 2020-05-20 DIAGNOSIS — E871 Hypo-osmolality and hyponatremia: Secondary | ICD-10-CM | POA: Diagnosis not present

## 2020-05-20 DIAGNOSIS — I482 Chronic atrial fibrillation, unspecified: Secondary | ICD-10-CM | POA: Diagnosis not present

## 2020-05-20 DIAGNOSIS — R0600 Dyspnea, unspecified: Secondary | ICD-10-CM | POA: Diagnosis not present

## 2020-05-21 DIAGNOSIS — E871 Hypo-osmolality and hyponatremia: Secondary | ICD-10-CM | POA: Diagnosis not present

## 2020-05-21 DIAGNOSIS — I5022 Chronic systolic (congestive) heart failure: Secondary | ICD-10-CM | POA: Diagnosis not present

## 2020-05-21 DIAGNOSIS — I340 Nonrheumatic mitral (valve) insufficiency: Secondary | ICD-10-CM | POA: Diagnosis not present

## 2020-05-21 DIAGNOSIS — I482 Chronic atrial fibrillation, unspecified: Secondary | ICD-10-CM | POA: Diagnosis not present

## 2020-05-21 DIAGNOSIS — I361 Nonrheumatic tricuspid (valve) insufficiency: Secondary | ICD-10-CM | POA: Diagnosis not present

## 2020-05-22 DIAGNOSIS — I5022 Chronic systolic (congestive) heart failure: Secondary | ICD-10-CM | POA: Diagnosis not present

## 2020-05-22 DIAGNOSIS — E871 Hypo-osmolality and hyponatremia: Secondary | ICD-10-CM | POA: Diagnosis not present

## 2020-05-22 DIAGNOSIS — I482 Chronic atrial fibrillation, unspecified: Secondary | ICD-10-CM | POA: Diagnosis not present

## 2020-08-01 DIAGNOSIS — E11621 Type 2 diabetes mellitus with foot ulcer: Secondary | ICD-10-CM | POA: Diagnosis not present

## 2020-08-01 DIAGNOSIS — I5022 Chronic systolic (congestive) heart failure: Secondary | ICD-10-CM | POA: Diagnosis not present

## 2020-08-01 DIAGNOSIS — L97509 Non-pressure chronic ulcer of other part of unspecified foot with unspecified severity: Secondary | ICD-10-CM | POA: Diagnosis not present

## 2020-08-06 ENCOUNTER — Encounter: Payer: Self-pay | Admitting: *Deleted

## 2020-08-06 ENCOUNTER — Encounter: Payer: Self-pay | Admitting: Cardiology

## 2020-08-22 DIAGNOSIS — M75100 Unspecified rotator cuff tear or rupture of unspecified shoulder, not specified as traumatic: Secondary | ICD-10-CM | POA: Insufficient documentation

## 2020-08-22 DIAGNOSIS — E7800 Pure hypercholesterolemia, unspecified: Secondary | ICD-10-CM | POA: Insufficient documentation

## 2020-08-22 DIAGNOSIS — K219 Gastro-esophageal reflux disease without esophagitis: Secondary | ICD-10-CM | POA: Insufficient documentation

## 2020-08-22 DIAGNOSIS — E291 Testicular hypofunction: Secondary | ICD-10-CM | POA: Insufficient documentation

## 2020-08-22 DIAGNOSIS — D649 Anemia, unspecified: Secondary | ICD-10-CM | POA: Insufficient documentation

## 2020-08-22 DIAGNOSIS — G4730 Sleep apnea, unspecified: Secondary | ICD-10-CM | POA: Insufficient documentation

## 2020-08-23 DIAGNOSIS — I679 Cerebrovascular disease, unspecified: Secondary | ICD-10-CM | POA: Insufficient documentation

## 2020-08-23 DIAGNOSIS — I4891 Unspecified atrial fibrillation: Secondary | ICD-10-CM | POA: Insufficient documentation

## 2020-08-23 DIAGNOSIS — E785 Hyperlipidemia, unspecified: Secondary | ICD-10-CM | POA: Insufficient documentation

## 2020-08-23 DIAGNOSIS — G894 Chronic pain syndrome: Secondary | ICD-10-CM | POA: Insufficient documentation

## 2020-08-23 DIAGNOSIS — M25519 Pain in unspecified shoulder: Secondary | ICD-10-CM | POA: Insufficient documentation

## 2020-08-24 ENCOUNTER — Other Ambulatory Visit: Payer: Self-pay

## 2020-08-24 ENCOUNTER — Ambulatory Visit (INDEPENDENT_AMBULATORY_CARE_PROVIDER_SITE_OTHER): Payer: Medicare Other | Admitting: Cardiology

## 2020-08-24 ENCOUNTER — Encounter: Payer: Self-pay | Admitting: Cardiology

## 2020-08-24 VITALS — BP 140/76 | HR 125 | Ht 75.0 in | Wt 212.6 lb

## 2020-08-24 DIAGNOSIS — I2510 Atherosclerotic heart disease of native coronary artery without angina pectoris: Secondary | ICD-10-CM | POA: Diagnosis not present

## 2020-08-24 DIAGNOSIS — I4819 Other persistent atrial fibrillation: Secondary | ICD-10-CM

## 2020-08-24 DIAGNOSIS — F17210 Nicotine dependence, cigarettes, uncomplicated: Secondary | ICD-10-CM

## 2020-08-24 DIAGNOSIS — Z720 Tobacco use: Secondary | ICD-10-CM

## 2020-08-24 DIAGNOSIS — E782 Mixed hyperlipidemia: Secondary | ICD-10-CM | POA: Diagnosis not present

## 2020-08-24 DIAGNOSIS — I679 Cerebrovascular disease, unspecified: Secondary | ICD-10-CM

## 2020-08-24 NOTE — Progress Notes (Signed)
Cardiology Office Note:    Date:  08/24/2020   ID:  Fernando Maddox, DOB Mar 22, 1961, MRN [id number]  PCP:  Fernando Fat, MD  Cardiologist:  Fernando Brothers, MD   Referring MD: Fernando Fat, MD    ASSESSMENT:    1. Coronary artery disease involving native heart, unspecified vessel or lesion type, unspecified whether angina present   2. Persistent atrial fibrillation (HCC)   3. Cerebrovascular disease   4. Mixed hyperlipidemia   5. Tobacco use    PLAN:    In order of problems listed above:  1. Coronary artery disease: Secondary prevention stressed with the patient.  Importance of compliance with diet medication stressed and it appeared to me that he really did not care much about compliance.  He is not very communicative gentleman.  He is a poor historian.  He basically answered my questions and communicated with me with single words or 2 words at best. 2. Essential hypertension: Blood pressure stable 3. Atrial fibrillation:I discussed with the patient atrial fibrillation, disease process. Management and therapy including rate and rhythm control, anticoagulation benefits and potential risks were discussed extensively with the patient. Patient had multiple questions which were answered to patient's satisfaction.  His ventricular rate is elevated therefore I have asked him to increase metoprolol from 25 mg daily to 50 mg in the morning and 25 mg at night. 4. Mixed dyslipidemia diabetes mellitus: Managed by primary care physician.  Diet was emphasized. 5. Cardiomyopathy: At this point I will not initiate any other medications.  His blood pressure is borderline and I question his compliance. 6. Cigarette smoker: I spent 5 minutes with the patient discussing solely about smoking. Smoking cessation was counseled. I suggested to the patient also different medications and pharmacological interventions. Patient is keen to try stopping on its own at this time. He will get back to me if he needs  any further assistance in this matter. 7. Follow-up appointment in a month or earlier if he has any concerns.   Medication Adjustments/Labs and Tests Ordered: Current medicines are reviewed at length with the patient today.  Concerns regarding medicines are outlined above.  Orders Placed This Encounter  Procedures  . EKG 12-Lead   No orders of the defined types were placed in this encounter.    History of Present Illness:    Fernando Maddox is a 59 y.o. male who is being seen today for the evaluation of coronary artery disease, atrial fibrillation, cardiomyopathy, essential hypertension and dyslipidemia.  At the request of Fernando Fat, MD.  Patient is a 59 year old male.  He is a poor historian.  He has had also history of strokes.  He mentions to me that he is here because his doctor referred him here.  He denies any problems.  He tells me that he is fine.  He tells me that when he exerts himself he gets out of breath.  At the time of my evaluation, the patient is alert awake oriented and in no distress.  I reviewed records from [facility] he was admitted there a few months ago.  Ejection fraction was moderately depressed.  Past Medical History:  Diagnosis Date  . Acute combined systolic and diastolic CHF, NYHA class 1 (HCC) 08/07/2016  . Anemia   . Atrial fibrillation (HCC)   . BACK PAIN 02/11/2010   Qualifier: Diagnosis of  By: Manson Passey, RN, BSN, Lauren    . CAD (coronary artery disease) 02/11/2010   Qualifier: Diagnosis of  ByManson Passey, RN, BSN, Lauren    . Cellulitis 09/29/2017  . Cerebrovascular disease   . CHEST PAIN 02/11/2010   Qualifier: Diagnosis of  By: Manson Passey, RN, BSN, Lauren    . Chronic anticoagulation 03/25/2018  . Chronic pain syndrome   . Chronic systolic congestive heart failure (HCC) 03/25/2018  . Community acquired pneumonia 08/11/2016  . DEPRESSION/ANXIETY 02/11/2010   Qualifier: Diagnosis of  By: Manson Passey, RN, BSN, Lauren    . Diabetes mellitus, type 2 (HCC)  02/11/2010   Qualifier: Diagnosis of  By: Manson Passey, RN, BSN, Lauren    . Diabetic ulcer of toe of right foot associated with type 2 diabetes mellitus (HCC) 10/01/2017  . ESOPHAGEAL STRICTURE 02/11/2010   Qualifier: Diagnosis of  By: Manson Passey, RN, BSN, Lauren    . GERD (gastroesophageal reflux disease)   . HEADACHE 02/11/2010   Qualifier: Diagnosis of  By: Manson Passey, RN, BSN, Lauren    . History of CVA (cerebrovascular accident) 09/29/2017  . History of drug abuse (HCC) 08/05/2016  . Hypercholesteremia   . Hyperlipemia 02/11/2010   Qualifier: Diagnosis of  By: Manson Passey, RN, BSN, Lauren    . Hyperlipidemia   . Hypertension 02/11/2010   Qualifier: Diagnosis of  By: Manson Passey, RN, BSN, Lauren    . HYPERTENSION, UNSPECIFIED 02/11/2010   Qualifier: Diagnosis of  By: Manson Passey, RN, BSN, Lauren    . Hypogonadism male   . 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 Status post thrombectomy  . Osteoarthritis 08/05/2016  . Osteomyelitis, unspecified (HCC) 10/06/2017  . PAF (paroxysmal atrial fibrillation) (HCC) 08/05/2016  . Polysubstance abuse (HCC) 09/29/2017  . Rotator cuff syndrome    Shoulder  . Shoulder pain   . 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 Sleep apnea   . Stroke (HCC) 08/05/2016  . Tobacco use 08/11/2016    Past Surgical History:  Procedure Laterality Date  . NO PAST SURGERIES      Current Medications: Current Meds  Medication Sig  . folic acid (FOLVITE) 1 MG tablet Take 1 mg by mouth daily.  . furosemide (LASIX) 40 MG tablet Take 40 mg by mouth.  Marland Kitchen glipiZIDE (GLUCOTROL XL) 2.5 MG 24 hr tablet Take 2.5 mg by mouth daily.  Marland Kitchen losartan (COZAAR) 25 MG tablet Take 25 mg by mouth in the morning and at bedtime.  . metoprolol succinate (TOPROL-XL) 25 MG 24 hr tablet Take 25 mg by mouth daily.   . pantoprazole (PROTONIX) 40 MG tablet Take 40 mg by mouth daily.   . potassium chloride (KLOR-CON) 10  MEQ tablet Take 10 mEq by mouth daily.  . pravastatin (PRAVACHOL) 40 MG tablet Take 40 mg by mouth daily.  . rivaroxaban (XARELTO) 20 MG TABS tablet Take 20 mg by mouth daily with supper.  Marland Kitchen spironolactone (ALDACTONE) 25 MG tablet Take 25 mg by mouth daily.      Allergies:   Codeine, Bee venom, Hydrocodone-acetaminophen, Other, Cephalexin, Vicodin [hydrocodone-acetaminophen], and Zocor [simvastatin]   Social History   Socioeconomic History  . Marital status: Married    Spouse name: Not on file  . Number of children: Not on file  . Years of education: Not on file  . Highest education level: Not on file  Occupational History  . Not on file  Tobacco Use  . Smoking status: Former Smoker    Types: Cigarettes    Start date: 11/06/2016  . Smokeless tobacco: Former Neurosurgeon  Quit date: 03/06/2016  Substance and Sexual Activity  . Alcohol use: Not on file  . Drug use: Not on file  . Sexual activity: Not on file  Other Topics Concern  . Not on file  Social History Narrative  . Not on file   Social Determinants of Health   Financial Resource Strain:   . Difficulty of Paying Living Expenses: Not on file  Food Insecurity:   . Worried About Programme researcher, broadcasting/film/video in the Last Year: Not on file  . Ran Out of Food in the Last Year: Not on file  Transportation Needs:   . Lack of Transportation (Medical): Not on file  . Lack of Transportation (Non-Medical): Not on file  Physical Activity:   . Days of Exercise per Week: Not on file  . Minutes of Exercise per Session: Not on file  Stress:   . Feeling of Stress : Not on file  Social Connections:   . Frequency of Communication with Friends and Family: Not on file  . Frequency of Social Gatherings with Friends and Family: Not on file  . Attends Religious Services: Not on file  . Active Member of Clubs or Organizations: Not on file  . Attends Banker Meetings: Not on file  . Marital Status: Not on file     Family History: The  patient's family history includes Hypertension in his father and mother.  ROS:   Please see the history of present illness.    All other systems reviewed and are negative.  EKGs/Labs/Other Studies Reviewed:    The following studies were reviewed today: EKG reveals atrial fibrillation with elevated rate of more than 120   Recent Labs: No results found for requested labs within last 8760 hours.  Recent Lipid Panel    Component Value Date/Time   CHOL (H) 01/15/2010 0420    242        ATP III CLASSIFICATION:  <200     mg/dL   Desirable  696-295  mg/dL   Borderline High  >=284    mg/dL   High          TRIG 132 (H) 01/15/2010 0420   HDL 35 (L) 01/15/2010 0420   CHOLHDL 6.9 01/15/2010 0420   VLDL UNABLE TO CALCULATE IF TRIGLYCERIDE OVER 400 mg/dL 44/10/270 5366   LDLCALC  01/15/2010 0420    UNABLE TO CALCULATE IF TRIGLYCERIDE OVER 400 mg/dL        Total Cholesterol/HDL:CHD Risk Coronary Heart Disease Risk Table                     Men   Women  1/2 Average Risk   3.4   3.3  Average Risk       5.0   4.4  2 X Average Risk   9.6   7.1  3 X Average Risk  23.4   11.0        Use the calculated Patient Ratio above and the CHD Risk Table to determine the patient's CHD Risk.        ATP III CLASSIFICATION (LDL):  <100     mg/dL   Optimal  440-347  mg/dL   Near or Above                    Optimal  130-159  mg/dL   Borderline  425-956  mg/dL   High  >387     mg/dL   Very High  Physical Exam:    VS:  BP 140/76   Pulse (!) 125   Ht 6\' 3"  (1.905 m)   Wt 212 lb 9.6 oz (96.4 kg)   SpO2 98%   BMI 26.57 kg/m     Wt Readings from Last 3 Encounters:  08/24/20 212 lb 9.6 oz (96.4 kg)  08/01/20 221 lb (100.2 kg)  04/15/18 236 lb (107 kg)     GEN: Patient is in no acute distress HEENT: Normal NECK: No JVD; No carotid bruits LYMPHATICS: No lymphadenopathy CARDIAC: S1 S2 regular, 2/6 systolic murmur at the apex. RESPIRATORY:  Clear to auscultation without rales, wheezing or  rhonchi  ABDOMEN: Soft, non-tender, non-distended MUSCULOSKELETAL:  No edema; No deformity  SKIN: Warm and dry NEUROLOGIC:  Alert and oriented x 3 PSYCHIATRIC:  Normal affect    Signed, 04/17/18, MD  08/24/2020 3:01 PM    [facility]

## 2020-08-24 NOTE — Patient Instructions (Signed)
Medication Instructions:  Your physician has recommended you make the following change in your medication:   Increase your metoprolol to 50 mg in am and 25 mg pm.  *If you need a refill on your cardiac medications before your next appointment, please call your pharmacy*   Lab Work: None ordered If you have labs (blood work) drawn today and your tests are completely normal, you will receive your results only by: Marland Kitchen MyChart Message (if you have MyChart) OR . A paper copy in the mail If you have any lab test that is abnormal or we need to change your treatment, we will call you to review the results.   Testing/Procedures: None ordered   Follow-Up: At [facility], you and your health needs are our priority.  As part of our continuing mission to provide you with exceptional heart care, we have created designated Provider Care Teams.  These Care Teams include your primary Cardiologist (physician) and Advanced Practice Providers (APPs -  Physician Assistants and Nurse Practitioners) who all work together to provide you with the care you need, when you need it.  We recommend signing up for the patient portal called "MyChart".  Sign up information is provided on this After Visit Summary.  MyChart is used to connect with patients for Virtual Visits (Telemedicine).  Patients are able to view lab/test results, encounter notes, upcoming appointments, etc.  Non-urgent messages can be sent to your provider as well.   To learn more about what you can do with MyChart, go to [email or web address].    Your next appointment:   12 month(s)  The format for your next appointment:   In Person  Provider:   Belva Crome, MD   Other Instructions NA

## 2021-02-03 DIAGNOSIS — E785 Hyperlipidemia, unspecified: Secondary | ICD-10-CM | POA: Diagnosis not present

## 2021-02-03 DIAGNOSIS — I517 Cardiomegaly: Secondary | ICD-10-CM | POA: Diagnosis not present

## 2021-02-03 DIAGNOSIS — E871 Hypo-osmolality and hyponatremia: Secondary | ICD-10-CM | POA: Diagnosis not present

## 2021-02-03 DIAGNOSIS — I4892 Unspecified atrial flutter: Secondary | ICD-10-CM | POA: Diagnosis not present

## 2021-02-03 DIAGNOSIS — I340 Nonrheumatic mitral (valve) insufficiency: Secondary | ICD-10-CM

## 2021-02-03 DIAGNOSIS — I4891 Unspecified atrial fibrillation: Secondary | ICD-10-CM | POA: Diagnosis not present

## 2021-02-03 DIAGNOSIS — R0602 Shortness of breath: Secondary | ICD-10-CM | POA: Diagnosis not present

## 2021-02-03 DIAGNOSIS — E1165 Type 2 diabetes mellitus with hyperglycemia: Secondary | ICD-10-CM | POA: Diagnosis not present

## 2021-02-03 DIAGNOSIS — I361 Nonrheumatic tricuspid (valve) insufficiency: Secondary | ICD-10-CM

## 2021-02-04 DIAGNOSIS — I429 Cardiomyopathy, unspecified: Secondary | ICD-10-CM

## 2021-02-04 DIAGNOSIS — I509 Heart failure, unspecified: Secondary | ICD-10-CM

## 2021-02-04 DIAGNOSIS — Z720 Tobacco use: Secondary | ICD-10-CM

## 2021-02-04 DIAGNOSIS — I4891 Unspecified atrial fibrillation: Secondary | ICD-10-CM

## 2021-02-06 DIAGNOSIS — Z7901 Long term (current) use of anticoagulants: Secondary | ICD-10-CM | POA: Diagnosis not present

## 2021-02-06 DIAGNOSIS — E1151 Type 2 diabetes mellitus with diabetic peripheral angiopathy without gangrene: Secondary | ICD-10-CM | POA: Diagnosis not present

## 2021-02-06 DIAGNOSIS — Z955 Presence of coronary angioplasty implant and graft: Secondary | ICD-10-CM | POA: Diagnosis not present

## 2021-02-06 DIAGNOSIS — Z881 Allergy status to other antibiotic agents status: Secondary | ICD-10-CM | POA: Diagnosis not present

## 2021-02-06 DIAGNOSIS — Z885 Allergy status to narcotic agent status: Secondary | ICD-10-CM | POA: Diagnosis not present

## 2021-02-06 DIAGNOSIS — G8929 Other chronic pain: Secondary | ICD-10-CM | POA: Diagnosis not present

## 2021-02-06 DIAGNOSIS — I517 Cardiomegaly: Secondary | ICD-10-CM | POA: Diagnosis not present

## 2021-02-06 DIAGNOSIS — I252 Old myocardial infarction: Secondary | ICD-10-CM | POA: Diagnosis not present

## 2021-02-06 DIAGNOSIS — I119 Hypertensive heart disease without heart failure: Secondary | ICD-10-CM | POA: Diagnosis not present

## 2021-02-06 DIAGNOSIS — Z79899 Other long term (current) drug therapy: Secondary | ICD-10-CM | POA: Diagnosis not present

## 2021-02-06 DIAGNOSIS — R778 Other specified abnormalities of plasma proteins: Secondary | ICD-10-CM | POA: Diagnosis not present

## 2021-02-06 DIAGNOSIS — I4891 Unspecified atrial fibrillation: Secondary | ICD-10-CM | POA: Diagnosis not present

## 2021-02-06 DIAGNOSIS — I2510 Atherosclerotic heart disease of native coronary artery without angina pectoris: Secondary | ICD-10-CM | POA: Diagnosis not present

## 2021-02-06 DIAGNOSIS — F17210 Nicotine dependence, cigarettes, uncomplicated: Secondary | ICD-10-CM | POA: Diagnosis not present

## 2021-02-06 DIAGNOSIS — Z8673 Personal history of transient ischemic attack (TIA), and cerebral infarction without residual deficits: Secondary | ICD-10-CM | POA: Diagnosis not present

## 2021-02-06 DIAGNOSIS — R112 Nausea with vomiting, unspecified: Secondary | ICD-10-CM | POA: Diagnosis not present

## 2021-02-06 DIAGNOSIS — Z20822 Contact with and (suspected) exposure to covid-19: Secondary | ICD-10-CM | POA: Diagnosis not present

## 2021-02-06 DIAGNOSIS — M545 Low back pain, unspecified: Secondary | ICD-10-CM | POA: Diagnosis not present

## 2021-02-06 DIAGNOSIS — Z89419 Acquired absence of unspecified great toe: Secondary | ICD-10-CM | POA: Diagnosis not present

## 2021-02-06 DIAGNOSIS — I452 Bifascicular block: Secondary | ICD-10-CM | POA: Diagnosis not present

## 2021-02-06 DIAGNOSIS — Z7984 Long term (current) use of oral hypoglycemic drugs: Secondary | ICD-10-CM | POA: Diagnosis not present

## 2021-02-06 DIAGNOSIS — Z7982 Long term (current) use of aspirin: Secondary | ICD-10-CM | POA: Diagnosis not present

## 2021-02-06 DIAGNOSIS — R0602 Shortness of breath: Secondary | ICD-10-CM | POA: Diagnosis not present

## 2021-02-06 DIAGNOSIS — R0600 Dyspnea, unspecified: Secondary | ICD-10-CM | POA: Diagnosis not present

## 2021-02-06 DIAGNOSIS — R9431 Abnormal electrocardiogram [ECG] [EKG]: Secondary | ICD-10-CM | POA: Diagnosis not present

## 2021-02-07 ENCOUNTER — Telehealth: Payer: Self-pay | Admitting: Cardiology

## 2021-02-07 DIAGNOSIS — R0600 Dyspnea, unspecified: Secondary | ICD-10-CM | POA: Diagnosis not present

## 2021-02-07 NOTE — Telephone Encounter (Signed)
Pt states that he is trying to make an appointement as a hospital FU. Pt was in [facility] recently and seen at [facility] last pm. Pt did not want to discuss shortness of breath.

## 2021-02-07 NOTE — Telephone Encounter (Signed)
Pt c/o Shortness Of Breath: STAT if SOB developed within the last 24 hours or pt is noticeably SOB on the phone  1. Are you currently SOB (can you hear that pt is SOB on the phone)? Yes  2. How long have you been experiencing SOB? Yes  3. Are you SOB when sitting or when up moving around? both  4. Are you currently experiencing any other symptoms? no

## 2021-02-14 ENCOUNTER — Inpatient Hospital Stay (HOSPITAL_COMMUNITY)
Admission: AD | Admit: 2021-02-14 | Discharge: 2021-03-09 | DRG: 853 | Disposition: A | Discharge status: Skilled Nursing Facility | Payer: Medicare Other | Source: Other Acute Inpatient Hospital | Attending: Internal Medicine | Admitting: Internal Medicine

## 2021-02-14 DIAGNOSIS — R531 Weakness: Secondary | ICD-10-CM | POA: Diagnosis not present

## 2021-02-14 DIAGNOSIS — S199XXA Unspecified injury of neck, initial encounter: Secondary | ICD-10-CM | POA: Diagnosis not present

## 2021-02-14 DIAGNOSIS — I517 Cardiomegaly: Secondary | ICD-10-CM | POA: Diagnosis not present

## 2021-02-14 DIAGNOSIS — D638 Anemia in other chronic diseases classified elsewhere: Secondary | ICD-10-CM | POA: Diagnosis present

## 2021-02-14 DIAGNOSIS — R4182 Altered mental status, unspecified: Secondary | ICD-10-CM | POA: Diagnosis not present

## 2021-02-14 DIAGNOSIS — M869 Osteomyelitis, unspecified: Secondary | ICD-10-CM | POA: Diagnosis present

## 2021-02-14 DIAGNOSIS — D62 Acute posthemorrhagic anemia: Secondary | ICD-10-CM | POA: Diagnosis not present

## 2021-02-14 DIAGNOSIS — E1169 Type 2 diabetes mellitus with other specified complication: Secondary | ICD-10-CM | POA: Diagnosis present

## 2021-02-14 DIAGNOSIS — E440 Moderate protein-calorie malnutrition: Secondary | ICD-10-CM | POA: Diagnosis present

## 2021-02-14 DIAGNOSIS — A419 Sepsis, unspecified organism: Secondary | ICD-10-CM | POA: Diagnosis present

## 2021-02-14 DIAGNOSIS — G9341 Metabolic encephalopathy: Secondary | ICD-10-CM | POA: Diagnosis not present

## 2021-02-14 DIAGNOSIS — E11621 Type 2 diabetes mellitus with foot ulcer: Secondary | ICD-10-CM

## 2021-02-14 DIAGNOSIS — R609 Edema, unspecified: Secondary | ICD-10-CM | POA: Diagnosis not present

## 2021-02-14 DIAGNOSIS — I5043 Acute on chronic combined systolic (congestive) and diastolic (congestive) heart failure: Secondary | ICD-10-CM | POA: Diagnosis present

## 2021-02-14 DIAGNOSIS — L97529 Non-pressure chronic ulcer of other part of left foot with unspecified severity: Secondary | ICD-10-CM | POA: Diagnosis not present

## 2021-02-14 DIAGNOSIS — R6521 Severe sepsis with septic shock: Secondary | ICD-10-CM | POA: Diagnosis present

## 2021-02-14 DIAGNOSIS — F1210 Cannabis abuse, uncomplicated: Secondary | ICD-10-CM | POA: Diagnosis present

## 2021-02-14 DIAGNOSIS — F1510 Other stimulant abuse, uncomplicated: Secondary | ICD-10-CM | POA: Diagnosis present

## 2021-02-14 DIAGNOSIS — E871 Hypo-osmolality and hyponatremia: Secondary | ICD-10-CM

## 2021-02-14 DIAGNOSIS — I70262 Atherosclerosis of native arteries of extremities with gangrene, left leg: Secondary | ICD-10-CM | POA: Diagnosis not present

## 2021-02-14 DIAGNOSIS — L97519 Non-pressure chronic ulcer of other part of right foot with unspecified severity: Secondary | ICD-10-CM | POA: Diagnosis present

## 2021-02-14 DIAGNOSIS — E7800 Pure hypercholesterolemia, unspecified: Secondary | ICD-10-CM | POA: Diagnosis present

## 2021-02-14 DIAGNOSIS — I10 Essential (primary) hypertension: Secondary | ICD-10-CM | POA: Diagnosis not present

## 2021-02-14 DIAGNOSIS — Z7189 Other specified counseling: Secondary | ICD-10-CM | POA: Diagnosis not present

## 2021-02-14 DIAGNOSIS — L97524 Non-pressure chronic ulcer of other part of left foot with necrosis of bone: Secondary | ICD-10-CM | POA: Diagnosis not present

## 2021-02-14 DIAGNOSIS — G9389 Other specified disorders of brain: Secondary | ICD-10-CM | POA: Diagnosis not present

## 2021-02-14 DIAGNOSIS — I110 Hypertensive heart disease with heart failure: Secondary | ICD-10-CM | POA: Diagnosis present

## 2021-02-14 DIAGNOSIS — E861 Hypovolemia: Secondary | ICD-10-CM | POA: Diagnosis present

## 2021-02-14 DIAGNOSIS — Z7984 Long term (current) use of oral hypoglycemic drugs: Secondary | ICD-10-CM | POA: Diagnosis not present

## 2021-02-14 DIAGNOSIS — Z9114 Patient's other noncompliance with medication regimen: Secondary | ICD-10-CM

## 2021-02-14 DIAGNOSIS — R5383 Other fatigue: Secondary | ICD-10-CM | POA: Diagnosis not present

## 2021-02-14 DIAGNOSIS — J9601 Acute respiratory failure with hypoxia: Secondary | ICD-10-CM | POA: Diagnosis not present

## 2021-02-14 DIAGNOSIS — J9690 Respiratory failure, unspecified, unspecified whether with hypoxia or hypercapnia: Secondary | ICD-10-CM

## 2021-02-14 DIAGNOSIS — Z515 Encounter for palliative care: Secondary | ICD-10-CM | POA: Diagnosis not present

## 2021-02-14 DIAGNOSIS — I96 Gangrene, not elsewhere classified: Secondary | ICD-10-CM | POA: Diagnosis not present

## 2021-02-14 DIAGNOSIS — Y835 Amputation of limb(s) as the cause of abnormal reaction of the patient, or of later complication, without mention of misadventure at the time of the procedure: Secondary | ICD-10-CM | POA: Diagnosis not present

## 2021-02-14 DIAGNOSIS — Z79899 Other long term (current) drug therapy: Secondary | ICD-10-CM

## 2021-02-14 DIAGNOSIS — E876 Hypokalemia: Secondary | ICD-10-CM | POA: Diagnosis present

## 2021-02-14 DIAGNOSIS — Z20822 Contact with and (suspected) exposure to covid-19: Secondary | ICD-10-CM | POA: Diagnosis present

## 2021-02-14 DIAGNOSIS — I69398 Other sequelae of cerebral infarction: Secondary | ICD-10-CM

## 2021-02-14 DIAGNOSIS — I5020 Unspecified systolic (congestive) heart failure: Secondary | ICD-10-CM

## 2021-02-14 DIAGNOSIS — K449 Diaphragmatic hernia without obstruction or gangrene: Secondary | ICD-10-CM | POA: Diagnosis not present

## 2021-02-14 DIAGNOSIS — R0602 Shortness of breath: Secondary | ICD-10-CM | POA: Diagnosis not present

## 2021-02-14 DIAGNOSIS — E785 Hyperlipidemia, unspecified: Secondary | ICD-10-CM | POA: Diagnosis present

## 2021-02-14 DIAGNOSIS — I2510 Atherosclerotic heart disease of native coronary artery without angina pectoris: Secondary | ICD-10-CM | POA: Diagnosis present

## 2021-02-14 DIAGNOSIS — M19072 Primary osteoarthritis, left ankle and foot: Secondary | ICD-10-CM | POA: Diagnosis not present

## 2021-02-14 DIAGNOSIS — G894 Chronic pain syndrome: Secondary | ICD-10-CM | POA: Diagnosis present

## 2021-02-14 DIAGNOSIS — E875 Hyperkalemia: Secondary | ICD-10-CM | POA: Diagnosis not present

## 2021-02-14 DIAGNOSIS — I70229 Atherosclerosis of native arteries of extremities with rest pain, unspecified extremity: Secondary | ICD-10-CM

## 2021-02-14 DIAGNOSIS — I4821 Permanent atrial fibrillation: Secondary | ICD-10-CM | POA: Diagnosis present

## 2021-02-14 DIAGNOSIS — S0990XA Unspecified injury of head, initial encounter: Secondary | ICD-10-CM | POA: Diagnosis not present

## 2021-02-14 DIAGNOSIS — J9621 Acute and chronic respiratory failure with hypoxia: Secondary | ICD-10-CM | POA: Diagnosis not present

## 2021-02-14 DIAGNOSIS — I480 Paroxysmal atrial fibrillation: Secondary | ICD-10-CM | POA: Diagnosis not present

## 2021-02-14 DIAGNOSIS — Z87891 Personal history of nicotine dependence: Secondary | ICD-10-CM | POA: Diagnosis not present

## 2021-02-14 DIAGNOSIS — E1152 Type 2 diabetes mellitus with diabetic peripheral angiopathy with gangrene: Secondary | ICD-10-CM | POA: Diagnosis not present

## 2021-02-14 DIAGNOSIS — I160 Hypertensive urgency: Secondary | ICD-10-CM | POA: Diagnosis not present

## 2021-02-14 DIAGNOSIS — N179 Acute kidney failure, unspecified: Secondary | ICD-10-CM | POA: Diagnosis not present

## 2021-02-14 DIAGNOSIS — I4891 Unspecified atrial fibrillation: Secondary | ICD-10-CM | POA: Diagnosis not present

## 2021-02-14 DIAGNOSIS — L538 Other specified erythematous conditions: Secondary | ICD-10-CM | POA: Diagnosis not present

## 2021-02-14 DIAGNOSIS — G8918 Other acute postprocedural pain: Secondary | ICD-10-CM | POA: Diagnosis not present

## 2021-02-14 DIAGNOSIS — I5041 Acute combined systolic (congestive) and diastolic (congestive) heart failure: Secondary | ICD-10-CM | POA: Diagnosis not present

## 2021-02-14 DIAGNOSIS — I5021 Acute systolic (congestive) heart failure: Secondary | ICD-10-CM | POA: Diagnosis not present

## 2021-02-14 DIAGNOSIS — E222 Syndrome of inappropriate secretion of antidiuretic hormone: Secondary | ICD-10-CM | POA: Diagnosis not present

## 2021-02-14 DIAGNOSIS — Y92230 Patient room in hospital as the place of occurrence of the external cause: Secondary | ICD-10-CM | POA: Diagnosis not present

## 2021-02-14 DIAGNOSIS — K8020 Calculus of gallbladder without cholecystitis without obstruction: Secondary | ICD-10-CM | POA: Diagnosis not present

## 2021-02-14 DIAGNOSIS — E119 Type 2 diabetes mellitus without complications: Secondary | ICD-10-CM | POA: Diagnosis not present

## 2021-02-14 DIAGNOSIS — I281 Aneurysm of pulmonary artery: Secondary | ICD-10-CM | POA: Diagnosis not present

## 2021-02-14 DIAGNOSIS — Z66 Do not resuscitate: Secondary | ICD-10-CM | POA: Diagnosis not present

## 2021-02-14 DIAGNOSIS — L97509 Non-pressure chronic ulcer of other part of unspecified foot with unspecified severity: Secondary | ICD-10-CM

## 2021-02-14 DIAGNOSIS — I70263 Atherosclerosis of native arteries of extremities with gangrene, bilateral legs: Secondary | ICD-10-CM | POA: Diagnosis present

## 2021-02-14 DIAGNOSIS — I5084 End stage heart failure: Secondary | ICD-10-CM | POA: Diagnosis present

## 2021-02-14 DIAGNOSIS — G4733 Obstructive sleep apnea (adult) (pediatric): Secondary | ICD-10-CM | POA: Diagnosis present

## 2021-02-14 DIAGNOSIS — Z951 Presence of aortocoronary bypass graft: Secondary | ICD-10-CM

## 2021-02-14 DIAGNOSIS — W06XXXA Fall from bed, initial encounter: Secondary | ICD-10-CM | POA: Diagnosis not present

## 2021-02-14 DIAGNOSIS — I493 Ventricular premature depolarization: Secondary | ICD-10-CM | POA: Diagnosis present

## 2021-02-14 DIAGNOSIS — M4856XA Collapsed vertebra, not elsewhere classified, lumbar region, initial encounter for fracture: Secondary | ICD-10-CM | POA: Diagnosis not present

## 2021-02-14 DIAGNOSIS — L03116 Cellulitis of left lower limb: Secondary | ICD-10-CM | POA: Diagnosis not present

## 2021-02-14 DIAGNOSIS — R0902 Hypoxemia: Secondary | ICD-10-CM | POA: Diagnosis not present

## 2021-02-14 DIAGNOSIS — S32000A Wedge compression fracture of unspecified lumbar vertebra, initial encounter for closed fracture: Secondary | ICD-10-CM | POA: Diagnosis not present

## 2021-02-14 DIAGNOSIS — Z7901 Long term (current) use of anticoagulants: Secondary | ICD-10-CM | POA: Diagnosis not present

## 2021-02-14 DIAGNOSIS — Z872 Personal history of diseases of the skin and subcutaneous tissue: Secondary | ICD-10-CM | POA: Diagnosis not present

## 2021-02-14 DIAGNOSIS — T8781 Dehiscence of amputation stump: Secondary | ICD-10-CM

## 2021-02-14 DIAGNOSIS — M79605 Pain in left leg: Secondary | ICD-10-CM | POA: Diagnosis not present

## 2021-02-14 DIAGNOSIS — R32 Unspecified urinary incontinence: Secondary | ICD-10-CM | POA: Diagnosis not present

## 2021-02-14 DIAGNOSIS — Z9119 Patient's noncompliance with other medical treatment and regimen: Secondary | ICD-10-CM

## 2021-02-14 DIAGNOSIS — G4700 Insomnia, unspecified: Secondary | ICD-10-CM | POA: Diagnosis present

## 2021-02-14 DIAGNOSIS — I5022 Chronic systolic (congestive) heart failure: Secondary | ICD-10-CM | POA: Diagnosis not present

## 2021-02-14 DIAGNOSIS — E860 Dehydration: Secondary | ICD-10-CM | POA: Diagnosis not present

## 2021-02-14 DIAGNOSIS — R404 Transient alteration of awareness: Secondary | ICD-10-CM | POA: Diagnosis not present

## 2021-02-14 DIAGNOSIS — J690 Pneumonitis due to inhalation of food and vomit: Secondary | ICD-10-CM | POA: Diagnosis not present

## 2021-02-14 DIAGNOSIS — E1140 Type 2 diabetes mellitus with diabetic neuropathy, unspecified: Secondary | ICD-10-CM | POA: Diagnosis present

## 2021-02-14 DIAGNOSIS — I739 Peripheral vascular disease, unspecified: Secondary | ICD-10-CM | POA: Diagnosis not present

## 2021-02-14 DIAGNOSIS — Z6828 Body mass index (BMI) 28.0-28.9, adult: Secondary | ICD-10-CM

## 2021-02-14 DIAGNOSIS — Z452 Encounter for adjustment and management of vascular access device: Secondary | ICD-10-CM | POA: Diagnosis not present

## 2021-02-14 DIAGNOSIS — R52 Pain, unspecified: Secondary | ICD-10-CM | POA: Diagnosis not present

## 2021-02-14 DIAGNOSIS — M7989 Other specified soft tissue disorders: Secondary | ICD-10-CM | POA: Diagnosis not present

## 2021-02-14 DIAGNOSIS — K219 Gastro-esophageal reflux disease without esophagitis: Secondary | ICD-10-CM | POA: Diagnosis present

## 2021-02-14 DIAGNOSIS — I639 Cerebral infarction, unspecified: Secondary | ICD-10-CM | POA: Diagnosis not present

## 2021-02-14 DIAGNOSIS — I5042 Chronic combined systolic (congestive) and diastolic (congestive) heart failure: Secondary | ICD-10-CM | POA: Diagnosis not present

## 2021-02-14 DIAGNOSIS — L89156 Pressure-induced deep tissue damage of sacral region: Secondary | ICD-10-CM | POA: Diagnosis present

## 2021-02-14 DIAGNOSIS — L97511 Non-pressure chronic ulcer of other part of right foot limited to breakdown of skin: Secondary | ICD-10-CM | POA: Diagnosis not present

## 2021-02-14 DIAGNOSIS — I255 Ischemic cardiomyopathy: Secondary | ICD-10-CM | POA: Diagnosis present

## 2021-02-14 HISTORY — DX: Sepsis, unspecified organism: A41.9

## 2021-02-14 LAB — CBC
HCT: 37.5 % — ABNORMAL LOW (ref 39.0–52.0)
Hemoglobin: 12.5 g/dL — ABNORMAL LOW (ref 13.0–17.0)
MCH: 28 pg (ref 26.0–34.0)
MCHC: 33.3 g/dL (ref 30.0–36.0)
MCV: 83.9 fL (ref 80.0–100.0)
Platelets: 216 10*3/uL (ref 150–400)
RBC: 4.47 MIL/uL (ref 4.22–5.81)
RDW: 16.2 % — ABNORMAL HIGH (ref 11.5–15.5)
WBC: 15.3 10*3/uL — ABNORMAL HIGH (ref 4.0–10.5)
nRBC: 0 % (ref 0.0–0.2)

## 2021-02-14 LAB — COMPREHENSIVE METABOLIC PANEL
ALT: 23 U/L (ref 0–44)
AST: 20 U/L (ref 15–41)
Albumin: 2.6 g/dL — ABNORMAL LOW (ref 3.5–5.0)
Alkaline Phosphatase: 72 U/L (ref 38–126)
Anion gap: 8 (ref 5–15)
BUN: 12 mg/dL (ref 6–20)
CO2: 24 mmol/L (ref 22–32)
Calcium: 7.9 mg/dL — ABNORMAL LOW (ref 8.9–10.3)
Chloride: 94 mmol/L — ABNORMAL LOW (ref 98–111)
Creatinine, Ser: 0.99 mg/dL (ref 0.61–1.24)
GFR, Estimated: >60 mL/min (ref >60)
Glucose, Bld: 167 mg/dL — ABNORMAL HIGH (ref 70–99)
Potassium: 2.9 mmol/L — ABNORMAL LOW (ref 3.5–5.1)
Sodium: 126 mmol/L — ABNORMAL LOW (ref 135–145)
Total Bilirubin: 1.8 mg/dL — ABNORMAL HIGH (ref 0.3–1.2)
Total Protein: 5.2 g/dL — ABNORMAL LOW (ref 6.5–8.1)

## 2021-02-14 LAB — PROTIME-INR
INR: 1.5 — ABNORMAL HIGH (ref 0.8–1.2)
Prothrombin Time: 18.1 seconds — ABNORMAL HIGH (ref 11.4–15.2)

## 2021-02-14 LAB — LACTIC ACID, PLASMA: Lactic Acid, Venous: 1.6 mmol/L (ref 0.5–1.9)

## 2021-02-14 LAB — SURGICAL PCR SCREEN
MRSA, PCR: NEGATIVE
Staphylococcus aureus: POSITIVE — AB

## 2021-02-14 LAB — APTT: aPTT: 46 seconds — ABNORMAL HIGH (ref 24–36)

## 2021-02-14 LAB — PHOSPHORUS: Phosphorus: 2.8 mg/dL (ref 2.5–4.6)

## 2021-02-14 LAB — MAGNESIUM: Magnesium: 1.8 mg/dL (ref 1.7–2.4)

## 2021-02-14 LAB — HIV ANTIBODY (ROUTINE TESTING W REFLEX): HIV Screen 4th Generation wRfx: NONREACTIVE

## 2021-02-14 MED ORDER — POLYETHYLENE GLYCOL 3350 17 G PO PACK: 17.0000 g | PACK | Freq: Every day | ORAL | Status: DC | PRN

## 2021-02-14 MED ORDER — PIPERACILLIN-TAZOBACTAM 3.375 G IVPB
3.3750 g | Freq: Three times a day (TID) | INTRAVENOUS | Status: DC
  Administered 2021-02-15 – 2021-02-18 (×11): 3.375 g via INTRAVENOUS
  Filled 2021-02-14 (×11): qty 50

## 2021-02-14 MED ORDER — LACTATED RINGERS IV BOLUS
500.0000 mL | Freq: Once | INTRAVENOUS | Status: AC
  Administered 2021-02-14: 500 mL via INTRAVENOUS

## 2021-02-14 MED ORDER — PHENYLEPHRINE HCL-NACL 10-0.9 MG/250ML-% IV SOLN
0.0000 ug/min | INTRAVENOUS | Status: DC
  Administered 2021-02-14: 20 ug/min via INTRAVENOUS
  Administered 2021-02-15: 35 ug/min via INTRAVENOUS
  Filled 2021-02-14 (×2): qty 250

## 2021-02-14 MED ORDER — POTASSIUM CHLORIDE CRYS ER 20 MEQ PO TBCR
40.0000 meq | EXTENDED_RELEASE_TABLET | Freq: Once | ORAL | Status: AC
  Administered 2021-02-14: 40 meq via ORAL
  Filled 2021-02-14: qty 2

## 2021-02-14 MED ORDER — DOCUSATE SODIUM 100 MG PO CAPS: 100.0000 mg | ORAL_CAPSULE | Freq: Two times a day (BID) | ORAL | Status: DC | PRN

## 2021-02-14 MED ORDER — SODIUM CHLORIDE 0.9 % IV SOLN: INTRAVENOUS | Status: DC

## 2021-02-14 MED ORDER — AMIODARONE HCL IN DEXTROSE 360-4.14 MG/200ML-% IV SOLN
30.0000 mg/h | INTRAVENOUS | Status: DC
  Administered 2021-02-15 – 2021-02-18 (×7): 30 mg/h via INTRAVENOUS
  Filled 2021-02-14 (×7): qty 200

## 2021-02-14 MED ORDER — AMIODARONE HCL IN DEXTROSE 360-4.14 MG/200ML-% IV SOLN
60.0000 mg/h | INTRAVENOUS | Status: AC
  Administered 2021-02-15: 60 mg/h via INTRAVENOUS
  Filled 2021-02-14: qty 200

## 2021-02-14 MED ORDER — AMIODARONE LOAD VIA INFUSION
150.0000 mg | Freq: Once | INTRAVENOUS | Status: AC
  Administered 2021-02-14: 150 mg via INTRAVENOUS
  Filled 2021-02-14: qty 83.34

## 2021-02-14 MED ORDER — HEPARIN (PORCINE) 25000 UT/250ML-% IV SOLN
3150.0000 [IU]/h | INTRAVENOUS | Status: DC
  Administered 2021-02-14: 1400 [IU]/h via INTRAVENOUS
  Administered 2021-02-15: 1550 [IU]/h via INTRAVENOUS
  Administered 2021-02-16 – 2021-02-17 (×4): 1900 [IU]/h via INTRAVENOUS
  Administered 2021-02-18: 2100 [IU]/h via INTRAVENOUS
  Administered 2021-02-18: 2500 [IU]/h via INTRAVENOUS
  Administered 2021-02-19: 2750 [IU]/h via INTRAVENOUS
  Administered 2021-02-19: 3000 [IU]/h via INTRAVENOUS
  Administered 2021-02-20: 3150 [IU]/h via INTRAVENOUS
  Filled 2021-02-14 (×10): qty 250

## 2021-02-14 MED ORDER — POTASSIUM CHLORIDE 10 MEQ/50ML IV SOLN
10.0000 meq | INTRAVENOUS | Status: AC
  Administered 2021-02-14 – 2021-02-15 (×2): 10 meq via INTRAVENOUS
  Filled 2021-02-14 (×3): qty 50

## 2021-02-14 NOTE — H&P (Signed)
NAME:  Fernando Maddox, MRN:  [id number], DOB:  09/22/1961, LOS: 0 ADMISSION DATE:  02/14/2021, CONSULTATION DATE:  02/14/2021 REFERRING MD:  [facility], CHIEF COMPLAINT:  Leg pain   History of Present Illness:  Fernando Maddox is a 60 y.o. gentleman with medical history of DM, HTN, strokes, and A. Fib on rate control and systemic AC who presents as a transfer from [facility] ED for evaluation of limb ischemic and septic shock.  Apparently had been having symptoms of shortness of breath the past several days, and has had repeated ED visits between [facility] and out of hospital for cough shortness of breath and abdominal pain.  He denies all of these symptoms tonight.  History is obtained primarily from chart review and records sent from [facility].  Apparently he had not been doing well for the last 2 days and his mother came to check on him and bring him food and he was found down and minimally responsive.  He was hypotensive with arrival of EMS. At the [facility] ED was found to have gangrenous appearance of the right lower extremity, with erythema and edema of the left lower extremity.  CT angio was performed which showed concern for acute right arterial occlusion.  He was started on heparin drip.  Dr. Myra Gianotti was consulted from the renal ED and is recommended that he be transferred to [facility] for further evaluation.  He was hydrated with IV fluids, started on antibiotics, vancomycin and pip-tazo.    This evening he is currently denying any pain or dyspnea.  He is rather somnolent but is arousable.  Labs have been sent and are pending.  Pertinent  Medical History  CAD A. Fib on AC DM HTN Strokes Polysubstance abuse  Significant Hospital Events: Including procedures, antibiotic start and stop dates in addition to other pertinent events   4/21 Admitted to [facility] from [facility] ED 4/21 central line placed, started on heparin drip  Interim History / Subjective:    Objective    Blood pressure 115/89, pulse (!) 117, resp. rate (!) 25, SpO2 100 %.       No intake or output data in the 24 hours ending 02/14/21 2212 There were no vitals filed for this visit.   Examination: General: Chronically ill-appearing, resting comfortably, no distress, room air HENT: Dry mucous membrane Lungs: Clear to auscultation bilaterally, no wheezes or crackles, no increased work of breathing Cardiovascular: Irregularly irregular, heart rate in the 110s Abdomen: Soft, nontender nondistended, normal bowel sounds Extremities: His left foot has substantial edema and erythema with bullous changes noticed on the digits.  His right foot has dusky, cyanotic digits.  Pulses are dopplerable bilaterally and sensation is intact Neuro: He is awake, follows commands, however he is fairly somnolent and reticent to answer questions MSK: erythema and bullous changes noted of the LLE  Labs/imaging that I havepersonally reviewed  (right click and "Reselect all SmartList Selections" daily)  Lab work from [place] ED today showed sodium 125, creatinine over 3,  hypokalemia, leukocytosis, elevated pct  4/21 CT angio performed at [facility] shows right superficial femoral artery arterial occlusion  Echocardiogram 2019 shows:  Moderate/Severe tricuspid regurgitation with an eccentric jet.  RVSP 50 mm Hg.  Moderate pulmonary hypertension.  Severely dilated left atrium.  Ejection fraction is visually estimated at 30-35%  Mild-to-moderately dilated left ventricle.  Mildly dilated right ventricle.   Resolved Hospital Problem list     Assessment & Plan:  Fernando Maddox is  a 60 y.o. man with CAD, tobacco use disorder and heart failure who presents with:  Acute Limb Ischemia CT showing acute arterial occlusion of the right superficial femoral artery On heparin drip, pulses are currently dopplerable, continue to monitor these Appreciate vascular assistance, no urgent need for surgery Currently  denies pain, may need pain control in the future  Septic Shock Likely with component of hypovolemia May benefit from additional fluid , he is already received some in [place]. We will titrate down pressors as tolerated, arrived on norepinephrine, transitioned to  Will continue vancomycin, add cefepime empirically  UA was negative for infection at , blood cultures were drawn  Coronary artery disease with ischemic cardiomyopathy, EF 30% Echocardiogram pending in setting of shock Appears slightly dehydrated on exam, may benefit from more fluid Hold diuretics  Atrial fibrillation with RVR Currently holding his rate control in setting of shock Suspect this will improve as he comes off of vasopressors and he may need more fluid E link has started amiodarone  Hyponatremia - hypo-osmolar and hypovolemic - judicious fluids and encourage oral intake - recheck BMP  Acute metabolic encephalopathy - somnolent and lethargic, may be secondary to hyponatremia - UDS positive for methamphetamins and marijuana on admission  Type 2 diabetes On oral agents, holding while inpatient Will monitor with sliding scale insulin and Accu-Cheks  Best practice (right click and "Reselect all SmartList Selections" daily)  Diet:  Oral Pain/Anxiety/Delirium protocol (if indicated): No VAP protocol (if indicated): Not indicated DVT prophylaxis: Systemic AC GI prophylaxis: N/A Glucose control:  SSI Yes Central venous access:  Yes, and it is still needed Arterial line:  N/A Foley:  N/A Mobility:  bed rest  PT consulted: N/A Last date of multidisciplinary goals of care discussion []  Code Status:  full code Disposition: ICU  Labs   CBC: Recent Labs  Lab 02/14/21 2127  WBC 15.3*  HGB 12.5*  HCT 37.5*  MCV 83.9  PLT 216    Basic Metabolic Panel: No results for input(s): NA, K, CL, CO2, GLUCOSE, BUN, CREATININE, CALCIUM, MG, PHOS in the last 168 hours. GFR: CrCl cannot be calculated  (Patient's most recent lab result is older than the maximum 21 days allowed.). Recent Labs  Lab 02/14/21 2127  WBC 15.3*  LATICACIDVEN 1.6    Liver Function Tests: No results for input(s): AST, ALT, ALKPHOS, BILITOT, PROT, ALBUMIN in the last 168 hours. No results for input(s): LIPASE, AMYLASE in the last 168 hours. No results for input(s): AMMONIA in the last 168 hours.  ABG    Component Value Date/Time   TCO2 27 01/14/2010 1809     Coagulation Profile: Recent Labs  Lab 02/14/21 2127  INR 1.5*    Cardiac Enzymes: No results for input(s): CKTOTAL, CKMB, CKMBINDEX, TROPONINI in the last 168 hours.  HbA1C: Hgb A1c MFr Bld  Date/Time Value Ref Range Status  01/15/2010 04:20 AM (H) 4.6 - 6.1 % Final   7.2 (NOTE) The ADA recommends the following therapeutic goal for glycemic control related to Hgb A1c measurement: Goal of therapy: <6.5 Hgb A1c  Reference: American Diabetes Association: Clinical Practice Recommendations 2010, Diabetes Care, 2010, 33: (Suppl  1).    CBG: No results for input(s): GLUCAP in the last 168 hours.  Review of Systems:   Denies chest pain, shortness of breath, nausea vomiting Away 10 point review systems reviewed and noncontributory  Past Medical History:  He,  has a past medical history of Acute combined systolic and diastolic CHF, NYHA class  1 (HCC) (08/07/2016), Anemia, Atrial fibrillation (HCC), BACK PAIN (02/11/2010), CAD (coronary artery disease) (02/11/2010), Cellulitis (09/29/2017), Cerebrovascular disease, CHEST PAIN (02/11/2010), Chronic anticoagulation (03/25/2018), Chronic pain syndrome, Chronic systolic congestive heart failure (HCC) (03/25/2018), Community acquired pneumonia (08/11/2016), DEPRESSION/ANXIETY (02/11/2010), Diabetes mellitus, type 2 (HCC) (02/11/2010), Diabetic ulcer of toe of right foot associated with type 2 diabetes mellitus (HCC) (10/01/2017), ESOPHAGEAL STRICTURE (02/11/2010), GERD (gastroesophageal reflux disease), HEADACHE  (02/11/2010), History of CVA (cerebrovascular accident) (09/29/2017), History of drug abuse (HCC) (08/05/2016), Hypercholesteremia, Hyperlipemia (02/11/2010), Hyperlipidemia, Hypertension (02/11/2010), HYPERTENSION, UNSPECIFIED (02/11/2010), Hypogonadism male, 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   Surgical History:   Past Surgical History:  Procedure Laterality Date   NO PAST SURGERIES       Social History:   reports that he has quit smoking. His smoking use included cigarettes. He started smoking about 4 years ago. He quit smokeless tobacco use about 4 years ago.   Family History:  His family history includes Hypertension in his father and mother.   Allergies Allergies  Allergen Reactions   Codeine Itching   Bee Venom Other (See Comments)    "Feels like his head is on fire when he goes outside in the sun"   Hydrocodone-Acetaminophen Other (See Comments)   Other Other (See Comments)    "Feels like his head is on fire when he goes outside in the sun"   Cephalexin    Vicodin [Hydrocodone-Acetaminophen] Itching   Zocor [Simvastatin] Other (See Comments)    Myalgias     Home Medications  Prior to Admission medications   Medication Sig Start Date End Date Taking? Authorizing Provider  folic acid (FOLVITE) 1 MG tablet Take 1 mg by mouth daily.    [provider]  furosemide (LASIX) 40 MG tablet Take 40 mg by mouth.    [provider]  glipiZIDE (GLUCOTROL XL) 2.5 MG 24 hr tablet Take 2.5 mg by mouth daily. 05/22/20   [provider]  lisinopril (PRINIVIL,ZESTRIL) 10 MG tablet Take 10 mg by mouth  daily. 08/12/16 08/06/20  [provider]  losartan (COZAAR) 25 MG tablet Take 25 mg by mouth in the morning and at bedtime.    [provider]  metoprolol succinate (TOPROL-XL) 25 MG 24 hr tablet Take 25 mg by mouth daily.  10/22/16   [provider]  pantoprazole (PROTONIX) 40 MG tablet Take 40 mg by mouth daily.  10/14/16   [provider]  potassium chloride (KLOR-CON) 10 MEQ tablet Take 10 mEq by mouth daily.    [provider]  pravastatin (PRAVACHOL) 40 MG tablet Take 40 mg by mouth daily.    [provider]  rivaroxaban (XARELTO) 20 MG TABS tablet Take 20 mg by mouth daily with supper.    [provider]  spironolactone (ALDACTONE) 25 MG tablet Take 25 mg by mouth daily.     [provider]     Critical care time: 1     The patient is critically ill due to septic shock, acute limb ischemia.  Critical care was necessary to treat or prevent imminent or life-threatening deterioration.  Critical care was time spent personally by me on the following activities: development of treatment plan with patient and/or surrogate as well as nursing, discussions with consultants, evaluation of  patient's response to treatment, examination of patient, obtaining history from patient or surrogate, ordering and performing treatments and interventions, ordering and review of laboratory studies, ordering and review of radiographic studies, pulse oximetry, re-evaluation of patient's condition and participation in multidisciplinary rounds.   Critical Care Time devoted to patient care services described in this note is 39 minutes. This time reflects time of care of this signee Charlott Holler . This critical care time does not reflect separately billable procedures or procedure time, teaching time or supervisory time of PA/NP/Med student/Med Resident etc but could involve care discussion time.       Charlott Holler [facility] 02/14/2021 10:12 PM  Pager: see AMION  If no response to pager , please call critical care on call (see AMION) until 7pm After 7:00 pm call Elink

## 2021-02-14 NOTE — Progress Notes (Addendum)
eLink Physician-Brief Progress Note Patient Name: Fernando Maddox DOB: May 13, 1961 MRN: [id number]   Date of Service  02/14/2021  HPI/Events of Note  Patient is on a Heparin IV infusion for AFIB with possible LE embolic ischemia. No orders for Heparin IV infusion.  Hyponatremia - Na+ = 126. CVP = 13.   eICU Interventions  Will order: 1. Heparin IV infusion per pharmacy consult.  2. 0.9 NaCl IV infusion at 100 mL/hour.      Intervention Category Major Interventions: Other:  Lenell Antu 02/14/2021, 10:37 PM

## 2021-02-14 NOTE — Progress Notes (Signed)
[facility] ADULT ICU REPLACEMENT PROTOCOL   The patient does apply for the [facility] Adult ICU Electrolyte Replacment Protocol based on the criteria listed below:   1. Is GFR >/= 30 ml/min? Yes.    Patient's GFR today is >60 2. Is SCr </= 2? Yes.   Patient's SCr is 0.99 ml/kg/hr 3. Did SCr increase >/= 0.5 in 24 hours? No. 4. Abnormal electrolyte(s):   K 2.9 5. Ordered repletion with: protocol 6. If a panic level lab has been reported, has the CCM MD in charge been notified? Yes.  .   Physician:  S. Bobbye Morton R Karon Heckendorn 02/14/2021 10:30 PM

## 2021-02-14 NOTE — Progress Notes (Signed)
ANTICOAGULATION CONSULT NOTE - Initial Consult  Pharmacy Consult for Heparin Indication: atrial fibrillation with possible LE embolus  Allergies  Allergen Reactions  . Codeine Itching  . Bee Venom Other (See Comments)    "Feels like his head is on fire when he goes outside in the sun"  . Hydrocodone-Acetaminophen Other (See Comments)  . Other Other (See Comments)    "Feels like his head is on fire when he goes outside in the sun"  . Cephalexin   . Vicodin [Hydrocodone-Acetaminophen] Itching  . Zocor [Simvastatin] Other (See Comments)    Myalgias    Patient Measurements: Weight: 90.7 kg (200 lb)  Vital Signs: BP: 106/63 (04/21 2200) Pulse Rate: 120 (04/21 2221)  Labs: Recent Labs    02/14/21 2127  HGB 12.5*  HCT 37.5*  PLT 216  APTT 46*  LABPROT 18.1*  INR 1.5*  CREATININE 0.99    Estimated Creatinine Clearance: 96 mL/min (by C-G formula based on SCr of 0.99 mg/dL).   Medical History: Past Medical History:  Diagnosis Date  . Acute combined systolic and diastolic CHF, NYHA class 1 (HCC) 08/07/2016  . Anemia   . Atrial fibrillation (HCC)   . BACK PAIN 02/11/2010   Qualifier: Diagnosis of  By: Manson Passey, RN, BSN, Lauren    . CAD (coronary artery disease) 02/11/2010   Qualifier: Diagnosis of  By: Manson Passey, RN, BSN, Lauren    . Cellulitis 09/29/2017  . Cerebrovascular disease   . CHEST PAIN 02/11/2010   Qualifier: Diagnosis of  By: Manson Passey, RN, BSN, Lauren    . Chronic anticoagulation 03/25/2018  . Chronic pain syndrome   . Chronic systolic congestive heart failure (HCC) 03/25/2018  . Community acquired pneumonia 08/11/2016  . DEPRESSION/ANXIETY 02/11/2010   Qualifier: Diagnosis of  By: Manson Passey, RN, BSN, Lauren    . Diabetes mellitus, type 2 (HCC) 02/11/2010   Qualifier: Diagnosis of  By: Manson Passey, RN, BSN, Lauren    . Diabetic ulcer of toe of right foot associated with type 2 diabetes mellitus (HCC) 10/01/2017  . ESOPHAGEAL STRICTURE 02/11/2010   Qualifier: Diagnosis of  By:  Manson Passey, RN, BSN, Lauren    . GERD (gastroesophageal reflux disease)   . HEADACHE 02/11/2010   Qualifier: Diagnosis of  By: Manson Passey, RN, BSN, Lauren    . History of CVA (cerebrovascular accident) 09/29/2017  . History of drug abuse (HCC) 08/05/2016  . Hypercholesteremia   . Hyperlipemia 02/11/2010   Qualifier: Diagnosis of  By: Manson Passey, RN, BSN, Lauren    . Hyperlipidemia   . Hypertension 02/11/2010   Qualifier: Diagnosis of  By: Manson Passey, RN, BSN, Lauren    . HYPERTENSION, UNSPECIFIED 02/11/2010   Qualifier: Diagnosis of  By: Manson Passey, RN, BSN, Lauren    . Hypogonadism male   . 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 Status post thrombectomy  . Osteoarthritis 08/05/2016  . Osteomyelitis, unspecified (HCC) 10/06/2017  . PAF (paroxysmal atrial fibrillation) (HCC) 08/05/2016  . Polysubstance abuse (HCC) 09/29/2017  . Rotator cuff syndrome    Shoulder  . Shoulder pain   . 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 Sleep apnea   . Stroke (HCC) 08/05/2016  . Tobacco use 08/11/2016    Assessment: 60 y.o. gentleman with medical history of DM, HTN, strokes, and A. Fib on rate control and systemic AC who presents as a transfer from [facility] ED for evaluation of limb ischemic and septic shock.  Per medical record had been on Xarelto, unclear when patient had his last dose. Patient started on Heparin infusion at 1400 units/hr at [facility]. Pharmacy consulted to follow heparin drip.  Will monitor HL and aPTT until the correlate since Xarelto will effect the Heparin level.  Goal of Therapy:  Heparin level 0.3-0.7 units/ml  APTT 66 - 102 secs Monitor platelets by anticoagulation protocol: Yes   Plan:  Continue Heparin infusion @ 1400 units/hr In 6 hours will draw aPTT and HL Monitor daily aPTT and HL and CBC  Jeanella Cara, PharmD, [facility] Clinical Pharmacist Please see AMION for all  Pharmacists' Contact Phone Numbers 02/14/2021, 10:55 PM

## 2021-02-14 NOTE — Consult Note (Signed)
Vascular and Vein Specialist of [facility]  Patient name: Fernando Maddox MRN: [id number] DOB: 09-16-1961 Sex: male   REQUESTING PROVIDER:   [facility]   REASON FOR CONSULT:    Cold leg  HISTORY OF PRESENT ILLNESS:   Fernando Maddox is a 60 y.o. male, who was previously seen by Dr. Darrick Penna for superficial ulceration of both feet.Marland Kitchen  He had a normal arterial evaluation at that time, with ABIs greater than 1.  This was in 2019.  He had a CT angiogram in 2017 that showed no significant arterial stenoses.  He was brought to [facility] emergency department today having been down for approximately 3 days.  He had a wound to his left foot and discoloration to his right.  The left foot was erythematous and edematous.  He had a CT scan that was concerning for arterial occlusion of the right leg.  Therefore transfer to [facility] was recommended.  He has been on Levophed for hypotension.  He was diagnosed with sepsis and treated accordingly.   The patient has a history of coronary artery disease.  He has atrial fibrillation and has been on Coumadin in the past but says that he does not take it anymore.  He is a current smoker.  He has a history of stroke in the past.  He is a diabetic PAST MEDICAL HISTORY    Past Medical History:  Diagnosis Date  . Acute combined systolic and diastolic CHF, NYHA class 1 (HCC) 08/07/2016  . Anemia   . Atrial fibrillation (HCC)   . BACK PAIN 02/11/2010   Qualifier: Diagnosis of  By: Manson Passey, RN, BSN, Lauren    . CAD (coronary artery disease) 02/11/2010   Qualifier: Diagnosis of  By: Manson Passey, RN, BSN, Lauren    . Cellulitis 09/29/2017  . Cerebrovascular disease   . CHEST PAIN 02/11/2010   Qualifier: Diagnosis of  By: Manson Passey, RN, BSN, Lauren    . Chronic anticoagulation 03/25/2018  . Chronic pain syndrome   . Chronic systolic congestive heart failure (HCC) 03/25/2018  . Community acquired pneumonia 08/11/2016  . DEPRESSION/ANXIETY 02/11/2010    Qualifier: Diagnosis of  By: Manson Passey, RN, BSN, Lauren    . Diabetes mellitus, type 2 (HCC) 02/11/2010   Qualifier: Diagnosis of  By: Manson Passey, RN, BSN, Lauren    . Diabetic ulcer of toe of right foot associated with type 2 diabetes mellitus (HCC) 10/01/2017  . ESOPHAGEAL STRICTURE 02/11/2010   Qualifier: Diagnosis of  By: Manson Passey, RN, BSN, Lauren    . GERD (gastroesophageal reflux disease)   . HEADACHE 02/11/2010   Qualifier: Diagnosis of  By: Manson Passey, RN, BSN, Lauren    . History of CVA (cerebrovascular accident) 09/29/2017  . History of drug abuse (HCC) 08/05/2016  . Hypercholesteremia   . Hyperlipemia 02/11/2010   Qualifier: Diagnosis of  By: Manson Passey, RN, BSN, Lauren    . Hyperlipidemia   . Hypertension 02/11/2010   Qualifier: Diagnosis of  By: Manson Passey, RN, BSN, Lauren    . HYPERTENSION, UNSPECIFIED 02/11/2010   Qualifier: Diagnosis of  By: Manson Passey, RN, BSN, Lauren    . Hypogonadism male   . 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 Status post thrombectomy  . Osteoarthritis 08/05/2016  . Osteomyelitis, unspecified (HCC) 10/06/2017  . PAF (paroxysmal atrial fibrillation) (HCC) 08/05/2016  . Polysubstance abuse (HCC) 09/29/2017  . Rotator cuff syndrome    Shoulder  . Shoulder pain   .  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 Sleep apnea   . Stroke (HCC) 08/05/2016  . Tobacco use 08/11/2016     FAMILY HISTORY   Family History  Problem Relation Age of Onset  . Hypertension Mother   . Hypertension Father     SOCIAL HISTORY:   Social History   Socioeconomic History  . Marital status: Married    Spouse name: Not on file  . Number of children: Not on file  . Years of education: Not on file  . Highest education level: Not on file  Occupational History  . Not on file  Tobacco Use  . Smoking status: Former Smoker    Types: Cigarettes    Start date: 11/06/2016  . Smokeless tobacco:  Former Neurosurgeon    Quit date: 03/06/2016  Substance and Sexual Activity  . Alcohol use: Not on file  . Drug use: Not on file  . Sexual activity: Not on file  Other Topics Concern  . Not on file  Social History Narrative  . Not on file   Social Determinants of Health   Financial Resource Strain: Not on file  Food Insecurity: Not on file  Transportation Needs: Not on file  Physical Activity: Not on file  Stress: Not on file  Social Connections: Not on file  Intimate Partner Violence: Not on file    ALLERGIES:    Allergies  Allergen Reactions  . Codeine Itching  . Bee Venom Other (See Comments)    "Feels like his head is on fire when he goes outside in the sun"  . Hydrocodone-Acetaminophen Other (See Comments)  . Other Other (See Comments)    "Feels like his head is on fire when he goes outside in the sun"  . Cephalexin   . Vicodin [Hydrocodone-Acetaminophen] Itching  . Zocor [Simvastatin] Other (See Comments)    Myalgias    CURRENT MEDICATIONS:    Current Facility-Administered Medications  Medication Dose Route Frequency Provider Last Rate Last Admin  . docusate sodium (COLACE) capsule 100 mg  100 mg Oral BID PRN Charlott Holler, MD      . polyethylene glycol (MIRALAX / GLYCOLAX) packet 17 g  17 g Oral Daily PRN Charlott Holler, MD        REVIEW OF SYSTEMS:   [X]  denotes positive finding, [ ]  denotes negative finding Cardiac  Comments:  Chest pain or chest pressure:    Shortness of breath upon exertion:    Short of breath when lying flat:    Irregular heart rhythm:        Vascular    Pain in calf, thigh, or hip brought on by ambulation:    Pain in feet at night that wakes you up from your sleep:     Blood clot in your veins:    Leg swelling:         Pulmonary    Oxygen at home:    Productive cough:     Wheezing:         Neurologic    Sudden weakness in arms or legs:     Sudden numbness in arms or legs:     Sudden onset of difficulty speaking or slurred  speech:    Temporary loss of vision in one eye:     Problems with dizziness:         Gastrointestinal    Blood in stool:      Vomited blood:  Genitourinary    Burning when urinating:     Blood in urine:        Psychiatric    Major depression:         Hematologic    Bleeding problems:    Problems with blood clotting too easily:        Skin    Rashes or ulcers: x       Constitutional    Fever or chills:     PHYSICAL EXAM:   There were no vitals filed for this visit.  GENERAL: The patient is a well-nourished male, in no acute distress.  He is somewhat obtunded CARDIAC: There is a regular rate and rhythm.  VASCULAR: Multiphasic right posterior tibial Doppler signal.  Also multiphasic left dorsalis pedis and posterior tibial PULMONARY: Nonlabored respirations ABDOMEN: Soft and non-tender with normal pitched bowel sounds.  MUSCULOSKELETAL: There are no major deformities or cyanosis. NEUROLOGIC: He is able to move toes on both feet without difficulty.  His sensation is intact SKIN: Bolus changes at the distal aspect of the left foot involving most of the toes with surrounding erythema and edema PSYCHIATRIC: The patient has a normal affect.  STUDIES:   I have reviewed the following studies: CT head: 1. No obvious acute intracranial hemorrhage identified. 2. Large right MCA territory old infarct and encephalomalacia. 3. No acute/traumatic cervical spine pathology.  CTA runoff:  VASCULAR  1. No significant opacification right lower extremity arteries beyond the mid superficial femoral artery. Findings are suspicious for arterial embolism. 2. No significant stenosis of left lower extremity arteries.  NON-VASCULAR  1. Interval development of mild to moderate compression fractures of the L1, L2, L3 and L4 vertebral bodies. 2. Mildly enlarged left retroperitoneal, pelvic, and inguinal lymph nodes are not significantly changed in size since prior  examination. This favors a benign etiology such as inflammation or an indolent malignancy such as a low-grade lymphoma/leukemia  ASSESSMENT and PLAN   PAD: His CT scan is concerning for arterial occlusion of the right mid superficial femoral artery, however on exam he has a brisk sounding posterior tibial Doppler signal.  In addition, he has normal motor and sensory function in both feet.  The most concerning issue is the appearance of his left foot with the bolus changes towards the base of his toes as well as erythema and edema.  Currently, the patient remains somewhat obtunded and on pressors.  I do not think that he needs urgent vascular surgery exploration, given his clinical findings.  He should remain on heparin.  I will get formal vascular lab studies tomorrow including ABIs, right lower extremity duplex, and left leg DVT ultrasound.  He should remain on heparin as well as IV antibiotics.  I will follow-up with him in the morning.  He is at risk for partial amputation of the left foot   Charlena Cross, MD, FACS Vascular and Vein Specialists of [facility] [phone] Pager [phone]

## 2021-02-14 NOTE — Progress Notes (Signed)
eLink Physician-Brief Progress Note Patient Name: Fernando Maddox DOB: 22-Dec-1960 MRN: [id number]   Date of Service  02/14/2021  HPI/Events of Note  AFIB with RVR - BP = 86/75 with MAP = 81. Ventricular rate = 110-130. CMP and Mg++ already ordered. Patient has IJ central venous line.   eICU Interventions  Plan: 1. Monitor CVP now and Q 4 hours. 2. Await CMP and Mg++ results.  3. Phenylephrine IV infusion. Titrate to MAP >= 65. 4. Amiodarone IV load and infusion.      Intervention Category Major Interventions: Arrhythmia - evaluation and management  Fernando Maddox 02/14/2021, 9:35 PM

## 2021-02-15 ENCOUNTER — Inpatient Hospital Stay (HOSPITAL_COMMUNITY): Payer: Medicare Other

## 2021-02-15 DIAGNOSIS — E785 Hyperlipidemia, unspecified: Secondary | ICD-10-CM

## 2021-02-15 DIAGNOSIS — L538 Other specified erythematous conditions: Secondary | ICD-10-CM | POA: Diagnosis not present

## 2021-02-15 DIAGNOSIS — I739 Peripheral vascular disease, unspecified: Secondary | ICD-10-CM | POA: Diagnosis not present

## 2021-02-15 DIAGNOSIS — I160 Hypertensive urgency: Secondary | ICD-10-CM | POA: Diagnosis not present

## 2021-02-15 DIAGNOSIS — I96 Gangrene, not elsewhere classified: Secondary | ICD-10-CM | POA: Diagnosis not present

## 2021-02-15 DIAGNOSIS — R609 Edema, unspecified: Secondary | ICD-10-CM | POA: Diagnosis not present

## 2021-02-15 DIAGNOSIS — I4891 Unspecified atrial fibrillation: Secondary | ICD-10-CM | POA: Diagnosis not present

## 2021-02-15 DIAGNOSIS — I639 Cerebral infarction, unspecified: Secondary | ICD-10-CM | POA: Diagnosis not present

## 2021-02-15 DIAGNOSIS — J9601 Acute respiratory failure with hypoxia: Secondary | ICD-10-CM

## 2021-02-15 LAB — HEPARIN LEVEL (UNFRACTIONATED)
Heparin Unfractionated: 0.21 IU/mL — ABNORMAL LOW (ref 0.30–0.70)
Heparin Unfractionated: 0.25 IU/mL — ABNORMAL LOW (ref 0.30–0.70)
Heparin Unfractionated: 0.24 IU/mL — ABNORMAL LOW (ref 0.30–0.70)

## 2021-02-15 LAB — CBC
HCT: 38.5 % — ABNORMAL LOW (ref 39.0–52.0)
Hemoglobin: 12.6 g/dL — ABNORMAL LOW (ref 13.0–17.0)
MCH: 27.7 pg (ref 26.0–34.0)
MCHC: 32.7 g/dL (ref 30.0–36.0)
MCV: 84.6 fL (ref 80.0–100.0)
Platelets: 226 10*3/uL (ref 150–400)
RBC: 4.55 MIL/uL (ref 4.22–5.81)
RDW: 16.4 % — ABNORMAL HIGH (ref 11.5–15.5)
WBC: 16.1 10*3/uL — ABNORMAL HIGH (ref 4.0–10.5)
nRBC: 0 % (ref 0.0–0.2)

## 2021-02-15 LAB — ECHOCARDIOGRAM COMPLETE
Area-P 1/2: 5.66 cm2
Calc EF: 25.8 %
MV M vel: 4.23 m/s
MV Peak grad: 71.6 mmHg
S' Lateral: 5.5 cm
Single Plane A2C EF: 28.4 %
Single Plane A4C EF: 22.3 %
Weight: 3400.38 oz

## 2021-02-15 LAB — GLUCOSE, CAPILLARY
Glucose-Capillary: 202 mg/dL — ABNORMAL HIGH (ref 70–99)
Glucose-Capillary: 119 mg/dL — ABNORMAL HIGH (ref 70–99)
Glucose-Capillary: 109 mg/dL — ABNORMAL HIGH (ref 70–99)

## 2021-02-15 LAB — BASIC METABOLIC PANEL
Anion gap: 8 (ref 5–15)
Anion gap: 15 (ref 5–15)
BUN: 12 mg/dL (ref 6–20)
BUN: 11 mg/dL (ref 6–20)
CO2: 22 mmol/L (ref 22–32)
CO2: 20 mmol/L — ABNORMAL LOW (ref 22–32)
Calcium: 7.7 mg/dL — ABNORMAL LOW (ref 8.9–10.3)
Calcium: 7.5 mg/dL — ABNORMAL LOW (ref 8.9–10.3)
Chloride: 94 mmol/L — ABNORMAL LOW (ref 98–111)
Chloride: 91 mmol/L — ABNORMAL LOW (ref 98–111)
Creatinine, Ser: 1.01 mg/dL (ref 0.61–1.24)
Creatinine, Ser: 1.03 mg/dL (ref 0.61–1.24)
GFR, Estimated: >60 mL/min (ref >60)
GFR, Estimated: >60 mL/min (ref >60)
Glucose, Bld: 170 mg/dL — ABNORMAL HIGH (ref 70–99)
Glucose, Bld: 188 mg/dL — ABNORMAL HIGH (ref 70–99)
Potassium: 5.4 mmol/L — ABNORMAL HIGH (ref 3.5–5.1)
Potassium: 3.7 mmol/L (ref 3.5–5.1)
Sodium: 124 mmol/L — ABNORMAL LOW (ref 135–145)
Sodium: 126 mmol/L — ABNORMAL LOW (ref 135–145)

## 2021-02-15 LAB — LACTIC ACID, PLASMA: Lactic Acid, Venous: 1.6 mmol/L (ref 0.5–1.9)

## 2021-02-15 LAB — APTT: aPTT: 43 seconds — ABNORMAL HIGH (ref 24–36)

## 2021-02-15 MED ORDER — INSULIN ASPART 100 UNIT/ML ~~LOC~~ SOLN
0.0000 [IU] | Freq: Three times a day (TID) | SUBCUTANEOUS | Status: DC
  Administered 2021-02-15: 5 [IU] via SUBCUTANEOUS
  Administered 2021-02-16: 2 [IU] via SUBCUTANEOUS
  Administered 2021-02-17: 3 [IU] via SUBCUTANEOUS

## 2021-02-15 MED ORDER — MAGNESIUM SULFATE 2 GM/50ML IV SOLN
2.0000 g | Freq: Once | INTRAVENOUS | Status: AC
  Administered 2021-02-15: 2 g via INTRAVENOUS
  Filled 2021-02-15: qty 50

## 2021-02-15 MED ORDER — VANCOMYCIN HCL 1250 MG/250ML IV SOLN
1250.0000 mg | Freq: Two times a day (BID) | INTRAVENOUS | Status: DC
  Administered 2021-02-15 – 2021-02-18 (×7): 1250 mg via INTRAVENOUS
  Filled 2021-02-15 (×10): qty 250

## 2021-02-15 MED ORDER — MUPIROCIN 2 % EX OINT
1.0000 "application " | TOPICAL_OINTMENT | Freq: Two times a day (BID) | CUTANEOUS | Status: AC
  Administered 2021-02-15 – 2021-02-19 (×10): 1 via NASAL
  Filled 2021-02-15 (×2): qty 22

## 2021-02-15 MED ORDER — POTASSIUM CHLORIDE CRYS ER 20 MEQ PO TBCR
40.0000 meq | EXTENDED_RELEASE_TABLET | Freq: Once | ORAL | Status: AC
  Administered 2021-02-15: 40 meq via ORAL
  Filled 2021-02-15: qty 2

## 2021-02-15 MED ORDER — CHLORHEXIDINE GLUCONATE CLOTH 2 % EX PADS
6.0000 | MEDICATED_PAD | Freq: Every day | CUTANEOUS | Status: DC
  Administered 2021-02-15 (×2): 6 via TOPICAL

## 2021-02-15 MED ORDER — FUROSEMIDE 10 MG/ML IJ SOLN
60.0000 mg | Freq: Four times a day (QID) | INTRAMUSCULAR | Status: AC
Start: 2021-02-15 — End: 2021-02-15
  Administered 2021-02-15 (×2): 60 mg via INTRAVENOUS
  Filled 2021-02-15 (×2): qty 6

## 2021-02-15 MED ORDER — CHLORHEXIDINE GLUCONATE CLOTH 2 % EX PADS
6.0000 | MEDICATED_PAD | Freq: Every day | CUTANEOUS | Status: AC
  Administered 2021-02-16 – 2021-02-18 (×3): 6 via TOPICAL

## 2021-02-15 MED ORDER — PERFLUTREN LIPID MICROSPHERE
1.0000 mL | INTRAVENOUS | Status: AC | PRN
  Administered 2021-02-15: 2 mL via INTRAVENOUS
  Filled 2021-02-15: qty 10

## 2021-02-15 MED ORDER — ONDANSETRON HCL 4 MG/2ML IJ SOLN
4.0000 mg | Freq: Four times a day (QID) | INTRAMUSCULAR | Status: DC | PRN
  Administered 2021-02-15: 4 mg via INTRAVENOUS
  Filled 2021-02-15: qty 2

## 2021-02-15 NOTE — Progress Notes (Signed)
Per Dr. Katrinka Blazing - ok to check intermittent pulse ox Q4 and silence alarms for respiratory rate. Orders placed.

## 2021-02-15 NOTE — Progress Notes (Addendum)
VASCULAR SURGERY ASSESSMENT & PLAN:   PAD: Marked pitting edema of LLE with intact Doppler pulses, motor function and sensation of both LE. Cellulitis and tissue loss of dorsal aspect of left forefoot.  Leukocytosis. Antibiotics continue. Lower extremity duplex and ABIs are pending.  LLE edema: calf if soft; no phlegmasia. LLE venous DVT duplex is pending. Heparin infusion; platelet count 226k at 0250 hrs.  Atrial fibrillation: RVR overnight on amiodarone and heparin infusions. HR currently 110s  Tobacco use  DM   SUBJECTIVE:   Eating breakfast. Mild lethargy. Denies pain.  PHYSICAL EXAM:   Vitals:   02/15/21 0530 02/15/21 0545 02/15/21 0600 02/15/21 0615  BP: (!) 113/96 (!) 77/66 92/62 (!) 86/68  Pulse: (!) 105 90 (!) 115 (!) 102  Resp: 19 (!) 26 (!) 28 (!) 25  SpO2: 100% (!) 76% (!) 86% 95%  Weight:       General appearance: Awake, alert in no apparent distress Cardiac: tele; a. Fib and mildly tachy Respirations: Nonlabored Extremities: Both feet are warm with intact sensation and motor function.  Brisk dorsalis pedis, posterior tibial and peroneal artery Doppler signals on the left and + AT, PT and peroneal signals on the right.  2+ pitting edema of LLE with bullous lesions at base of left toes, erythema and serosanguineous drainage. S/p partial right GT amputation well healed.        LABS:   Lab Results  Component Value Date   WBC 16.1 (H) 02/15/2021   HGB 12.6 (L) 02/15/2021   HCT 38.5 (L) 02/15/2021   MCV 84.6 02/15/2021   PLT 226 02/15/2021   Lab Results  Component Value Date   CREATININE 1.03 02/15/2021   Lab Results  Component Value Date   INR 1.5 (H) 02/14/2021   CBG (last 3)  No results for input(s): GLUCAP in the last 72 hours.  PROBLEM LIST:    Active Problems:   Septic shock (HCC)   CURRENT MEDS:   . Chlorhexidine Gluconate Cloth  6 each Topical Q0600  . mupirocin ointment  1 application Nasal BID    Fernando Maddox Office: [phone] 02/15/2021    I agree with the above.  I have seen and examined the patient.  ABIs are as followed:    MATAEO INGWERSEN VAS [place] ABI WITH/WO TBI Order# [id number] Reading physician: Leonie Douglas, MD Ordering physician: Nada Libman, MD Study date: 02/15/21   VAS [place] ABI WITH/WO TBI (Accession [id number]) (Order [id number]) Vascular Ultrasound Date: 02/14/2021 Department: [place] [facility] Released By/Authorizing: Nada Libman, MD (auto-released)    Exam Status  Status  Final [99]   Reason for Exam Priority: Routine Not on file   Exam Information  Status Exam Begun  Exam Ended   Final [99] 02/15/2021 9:39 AM 02/15/2021 10:30 AM   MyChart Results Release  MyChart Status: Code Expired Results Release    Order-Level Documents:  Scan on 02/15/2021 5:26 PM by Default, Provider, MD      Study Result   LOWER EXTREMITY DOPPLER STUDY   Patient Name: Fernando Maddox Date of Exam:  02/15/2021  Medical Rec #: [id number]   Accession #:  [id number]  Date of Birth: Jan 04, 1961   Patient Gender: M  Patient Age:  59Y  Exam Location: [facility]  Procedure:   VAS [place] ABI WITH/WO TBI  Referring Phys: [id number] Nada Libman    ---------------------------------------------------------------------------  -----    High Risk Factors: Hypertension, hyperlipidemia,  Diabetes, current smoker,           coronary artery disease, prior CVA.   Other Factors: CHF, Afib, Polysubstance abuse.  Comparison Study: Previous exam 04/15/18 -WNL   Performing Technologist: Ernestene Mention RVT, RDMS     Examination Guidelines: A complete evaluation includes at minimum, Doppler  waveform signals and systolic blood pressure reading at the level of  bilateral  brachial, anterior tibial, and posterior tibial arteries, when vessel  segments  are accessible. Bilateral testing is considered an integral part of a  complete   examination. Photoelectric Plethysmograph (PPG) waveforms and toe systolic  pressure readings are included as required and additional duplex testing  as  needed. Limited examinations for reoccurring indications may be performed  as  noted.     ABI Findings:  +--------+------------------+-----+----------------+--------+  Right  Rt Pressure (mmHg)IndexWaveform    Comment   +--------+------------------+-----+----------------+--------+  [id number]           biphasic          +--------+------------------+-----+----------------+--------+  PTA   117        1.04 audibly biphasic      +--------+------------------+-----+----------------+--------+  DP   111        0.98 audibly biphasic      +--------+------------------+-----+----------------+--------+   +--------+------------------+-----+----------------+-------+  Left  Lt Pressure (mmHg)IndexWaveform    Comment  +--------+------------------+-----+----------------+-------+  [id number]           triphasic          +--------+------------------+-----+----------------+-------+  PTA   106        0.94 audibly biphasic      +--------+------------------+-----+----------------+-------+  DP   115        1.02 audibly biphasic      +--------+------------------+-----+----------------+-------+   +-------+-----------+-----------+------------+------------+  ABI/TBIToday's ABIToday's TBIPrevious ABIPrevious TBI  +-------+-----------+-----------+------------+------------+  Right 1.04    0     1.23            +-------+-----------+-----------+------------+------------+  Left  1.02    0     1.15            +-------+-----------+-----------+------------+------------+    No evidence of DVT in the left leg.  No significant arterial insufficiency  The patient  continues to have severe infection of the left foot.  He will likely require at minimum a transmetatarsal amputation and possibly a below-knee amputation.  He remains critically ill.  I would recommend monitoring his cardiac function as well has his sepsis and treating him accordingly.  We will continue to follow him over the weekend.  Durene Cal

## 2021-02-15 NOTE — Progress Notes (Signed)
Bilateral ABI has been completed.  Results can be found under chart review under CV PROC. 02/15/2021 1:31 PM Carsen Machi RVT, RDMS

## 2021-02-15 NOTE — Progress Notes (Signed)
ANTICOAGULATION CONSULT NOTE   Pharmacy Consult for Heparin Indication: atrial fibrillation with possible LE embolus  Allergies  Allergen Reactions  . Codeine Itching  . Bee Venom Other (See Comments)    "Feels like his head is on fire when he goes outside in the sun"  . Hydrocodone-Acetaminophen Other (See Comments)  . Other Other (See Comments)    "Feels like his head is on fire when he goes outside in the sun"  . Cephalexin   . Vicodin [Hydrocodone-Acetaminophen] Itching  . Zocor [Simvastatin] Other (See Comments)    Myalgias    Patient Measurements: Weight: 96.4 kg (212 lb 8.4 oz)  Vital Signs: Temp: 99 F (37.2 C) (04/22 1522) Temp Source: Bladder (04/22 1522) BP: 102/81 (04/22 2000) Pulse Rate: 79 (04/22 2100)  Labs: Recent Labs    02/14/21 2127 02/15/21 0250 02/15/21 0531 02/15/21 1322 02/15/21 2122  HGB 12.5* 12.6*  --   --   --   HCT 37.5* 38.5*  --   --   --   PLT 216 226  --   --   --   APTT 46* 43*  --   --   --   LABPROT 18.1*  --   --   --   --   INR 1.5*  --   --   --   --   HEPARINUNFRC  --  0.21*  --  0.25* 0.24*  CREATININE 0.99 1.01 1.03  --   --     Estimated Creatinine Clearance: 92.3 mL/min (by C-G formula based on SCr of 1.03 mg/dL).   Medical History: Past Medical History:  Diagnosis Date  . Acute combined systolic and diastolic CHF, NYHA class 1 (HCC) 08/07/2016  . Anemia   . Atrial fibrillation (HCC)   . BACK PAIN 02/11/2010   Qualifier: Diagnosis of  By: Manson Passey, RN, BSN, Lauren    . CAD (coronary artery disease) 02/11/2010   Qualifier: Diagnosis of  By: Manson Passey, RN, BSN, Lauren    . Cellulitis 09/29/2017  . Cerebrovascular disease   . CHEST PAIN 02/11/2010   Qualifier: Diagnosis of  By: Manson Passey, RN, BSN, Lauren    . Chronic anticoagulation 03/25/2018  . Chronic pain syndrome   . Chronic systolic congestive heart failure (HCC) 03/25/2018  . Community acquired pneumonia 08/11/2016  . DEPRESSION/ANXIETY 02/11/2010   Qualifier: Diagnosis  of  By: Manson Passey, RN, BSN, Lauren    . Diabetes mellitus, type 2 (HCC) 02/11/2010   Qualifier: Diagnosis of  By: Manson Passey, RN, BSN, Lauren    . Diabetic ulcer of toe of right foot associated with type 2 diabetes mellitus (HCC) 10/01/2017  . ESOPHAGEAL STRICTURE 02/11/2010   Qualifier: Diagnosis of  By: Manson Passey, RN, BSN, Lauren    . GERD (gastroesophageal reflux disease)   . HEADACHE 02/11/2010   Qualifier: Diagnosis of  By: Manson Passey, RN, BSN, Lauren    . History of CVA (cerebrovascular accident) 09/29/2017  . History of drug abuse (HCC) 08/05/2016  . Hypercholesteremia   . Hyperlipemia 02/11/2010   Qualifier: Diagnosis of  By: Manson Passey, RN, BSN, Lauren    . Hyperlipidemia   . Hypertension 02/11/2010   Qualifier: Diagnosis of  By: Manson Passey, RN, BSN, Lauren    . HYPERTENSION, UNSPECIFIED 02/11/2010   Qualifier: Diagnosis of  By: Manson Passey, RN, BSN, Lauren    . Hypogonadism male   . 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 Status post thrombectomy  .  Osteoarthritis 08/05/2016  . Osteomyelitis, unspecified (HCC) 10/06/2017  . PAF (paroxysmal atrial fibrillation) (HCC) 08/05/2016  . Polysubstance abuse (HCC) 09/29/2017  . Rotator cuff syndrome    Shoulder  . Shoulder pain   . 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 Sleep apnea   . Stroke (HCC) 08/05/2016  . Tobacco use 08/11/2016    Assessment: 60 y.o. gentleman with medical history of DM, HTN, strokes, and A. Fib on rate control and systemic AC who presents as a transfer from [facility] ED for evaluation of limb ischemic and septic shock.  Per medical record had been on Xarelto, unclear when patient had his last dose. Patient started on Heparin infusion at 1400 units/hr at [place]. Pharmacy consulted to follow heparin drip.   Heparin level remains subtherapeutic at 0.24 despite increasing drip rate to 1700 units/hr. CBC stable this morning. No  overt bleeding or infusion issues noted.   Goal of Therapy:  Heparin level 0.3-0.7 units/ml  Monitor platelets by anticoagulation protocol: Yes   Plan:  Increase heparin to 1900 units/hr Check 6hr HL Monitor daily HL, CBC, s/sx bleeding  Jeanella Cara, PharmD, [facility] Clinical Pharmacist Please see AMION for all Pharmacists' Contact Phone Numbers 02/15/2021, 10:31 PM

## 2021-02-15 NOTE — Progress Notes (Signed)
ANTICOAGULATION CONSULT NOTE   Pharmacy Consult for Heparin Indication: atrial fibrillation with possible LE embolus  Allergies  Allergen Reactions  . Codeine Itching  . Bee Venom Other (See Comments)    "Feels like his head is on fire when he goes outside in the sun"  . Hydrocodone-Acetaminophen Other (See Comments)  . Other Other (See Comments)    "Feels like his head is on fire when he goes outside in the sun"  . Cephalexin   . Vicodin [Hydrocodone-Acetaminophen] Itching  . Zocor [Simvastatin] Other (See Comments)    Myalgias    Patient Measurements: Weight: 96.4 kg (212 lb 8.4 oz)  Vital Signs: Temp: 99.1 F (37.3 C) (04/22 1200) Temp Source: Bladder (04/22 1200) BP: 108/72 (04/22 1300) Pulse Rate: 87 (04/22 1300)  Labs: Recent Labs    02/14/21 2127 02/15/21 0250 02/15/21 0531 02/15/21 1322  HGB 12.5* 12.6*  --   --   HCT 37.5* 38.5*  --   --   PLT 216 226  --   --   APTT 46* 43*  --   --   LABPROT 18.1*  --   --   --   INR 1.5*  --   --   --   HEPARINUNFRC  --  0.21*  --  0.25*  CREATININE 0.99 1.01 1.03  --     Estimated Creatinine Clearance: 92.3 mL/min (by C-G formula based on SCr of 1.03 mg/dL).   Medical History: Past Medical History:  Diagnosis Date  . Acute combined systolic and diastolic CHF, NYHA class 1 (HCC) 08/07/2016  . Anemia   . Atrial fibrillation (HCC)   . BACK PAIN 02/11/2010   Qualifier: Diagnosis of  By: Manson Passey, RN, BSN, Lauren    . CAD (coronary artery disease) 02/11/2010   Qualifier: Diagnosis of  By: Manson Passey, RN, BSN, Lauren    . Cellulitis 09/29/2017  . Cerebrovascular disease   . CHEST PAIN 02/11/2010   Qualifier: Diagnosis of  By: Manson Passey, RN, BSN, Lauren    . Chronic anticoagulation 03/25/2018  . Chronic pain syndrome   . Chronic systolic congestive heart failure (HCC) 03/25/2018  . Community acquired pneumonia 08/11/2016  . DEPRESSION/ANXIETY 02/11/2010   Qualifier: Diagnosis of  By: Manson Passey, RN, BSN, Lauren    . Diabetes  mellitus, type 2 (HCC) 02/11/2010   Qualifier: Diagnosis of  By: Manson Passey, RN, BSN, Lauren    . Diabetic ulcer of toe of right foot associated with type 2 diabetes mellitus (HCC) 10/01/2017  . ESOPHAGEAL STRICTURE 02/11/2010   Qualifier: Diagnosis of  By: Manson Passey, RN, BSN, Lauren    . GERD (gastroesophageal reflux disease)   . HEADACHE 02/11/2010   Qualifier: Diagnosis of  By: Manson Passey, RN, BSN, Lauren    . History of CVA (cerebrovascular accident) 09/29/2017  . History of drug abuse (HCC) 08/05/2016  . Hypercholesteremia   . Hyperlipemia 02/11/2010   Qualifier: Diagnosis of  By: Manson Passey, RN, BSN, Lauren    . Hyperlipidemia   . Hypertension 02/11/2010   Qualifier: Diagnosis of  By: Manson Passey, RN, BSN, Lauren    . HYPERTENSION, UNSPECIFIED 02/11/2010   Qualifier: Diagnosis of  By: Manson Passey, RN, BSN, Lauren    . Hypogonadism male   . 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 Status post thrombectomy  . Osteoarthritis 08/05/2016  . Osteomyelitis, unspecified (HCC) 10/06/2017  . PAF (paroxysmal atrial fibrillation) (HCC) 08/05/2016  . Polysubstance abuse (HCC) 09/29/2017  .  Rotator cuff syndrome    Shoulder  . Shoulder pain   . 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 Sleep apnea   . Stroke (HCC) 08/05/2016  . Tobacco use 08/11/2016    Assessment: 60 y.o. gentleman with medical history of DM, HTN, strokes, and A. Fib on rate control and systemic AC who presents as a transfer from [facility] ED for evaluation of limb ischemic and septic shock.  Per medical record had been on Xarelto, unclear when patient had his last dose. Patient started on Heparin infusion at 1400 units/hr at [place]. Pharmacy consulted to follow heparin drip.   Heparin level remains subtherapeutic at 0.25 despite increasing drip rate to 1550 units/hr. CBC stable this morning. No overt bleeding or infusion issues  noted.   Goal of Therapy:  Heparin level 0.3-0.7 units/ml  Monitor platelets by anticoagulation protocol: Yes   Plan:  Increase heparin to 1700 units/hr Check 6hr HL Monitor daily HL, CBC, s/sx bleeding  Tama Headings, PharmD, BCPS PGY2 Cardiology Pharmacy Resident Phone: [phone] 02/15/2021  2:47 PM  Please check [email or web address] for unit-specific pharmacy phone numbers.

## 2021-02-15 NOTE — Progress Notes (Signed)
  Echocardiogram 2D Echocardiogram has been performed.  Janalyn Harder 02/15/2021, 12:13 PM

## 2021-02-15 NOTE — Progress Notes (Addendum)
NAME:  Fernando Maddox, MRN:  [id number], DOB:  [birth year], LOS: 1 ADMISSION DATE:  02/14/2021, CONSULTATION DATE:  02/15/2021 REFERRING MD:  [facility], CHIEF COMPLAINT:  Leg pain   History of Present Illness:  Fernando Maddox is a 60 y.o. gentleman with medical history of DM, HTN, strokes, and A. Fib on rate control and systemic AC who presents as a transfer from [facility] ED for evaluation of limb ischemic and septic shock.   Pertinent  Medical History  CAD A. Fib on AC DM HTN Strokes Polysubstance abuse  Significant Hospital Events: Including procedures, antibiotic start and stop dates in addition to other pertinent events   . 4/21 Admitted to [facility] from [facility] ED, found down with gangrenous appearance of the right lower extremity, with erythema and edema of the left lower extremity. CT angio was performed which showed concern for acute right arterial occlusion. . 4/21 central line placed, started on heparin drip  Interim History / Subjective:  No acute issues overnight Observations of apnea with resultant tachycardia and hypoxia morning of 4/22, BiPAP placed  Objective   Blood pressure (!) 86/68, pulse 84, temperature 99.7 F (37.6 C), resp. rate (!) 26, weight 96.4 kg, SpO2 99 %. CVP:  [10 mmHg-18 mmHg] 10 mmHg  FiO2 (%):  [40 %] 40 %   Intake/Output Summary (Last 24 hours) at 02/15/2021 0956 Last data filed at 02/15/2021 0900 Gross per 24 hour  Intake 1524.3 ml  Output 540 ml  Net 984.3 ml   Filed Weights   02/14/21 2221 02/15/21 0500  Weight: 90.7 kg 96.4 kg   Examination: General: Acute on chronic ill-appearing middle-aged male lying in bed in no acute distress HEENT: BiPAP now in place, MM pink/moist, PERRL,  Neuro: Lethargic but will arouse to voice and able to state name, nonfocal CV: s1s2 regular rate and rhythm, no murmur, rubs, or gallops,  PULM: Clear to auscultation bilaterally, no added breath sounds, observed periods of apnea prior to application of  BiPAP GI: soft, bowel sounds active in all 4 quadrants, non-tender, non-distended Extremities:      Labs/imaging that I havepersonally reviewed    4/21 Lab work from [facility] ED today showed sodium 125, creatinine over 3,  hypokalemia, leukocytosis, elevated pct  4/21 CT angio performed at [facility] shows right superficial femoral artery arterial occlusion  Echocardiogram 2019 shows: Moderate/Severe tricuspid regurgitation with an eccentric jet.  RVSP 50 mm Hg.  Moderate pulmonary hypertension.  Severely dilated left atrium.  Ejection fraction is visually estimated at 30-35%  Mild-to-moderately dilated left ventricle.  Mildly dilated right ventricle.   Resolved Hospital Problem list     Assessment & Plan:  Fernando Maddox is a 60 y.o. man with CAD, tobacco use disorder and heart failure who presents with:  Acute Limb Ischemia -CT showing acute arterial occlusion of the right superficial femoral artery On heparin drip, pulses are currently dopplerable, P: Vascular surgery following, appreciate assistance Continue heparin drip Frequent neurovascular checks Local wound care Adequate pain control  Septic Shock -Likely with component of hypovolemia P: Close monitoring in the ICU setting  Supplemental oxygen as above Follow cultures Continue IV Zosyn and vancomycin Pressor requirement decreasing Moderate urine output  Coronary artery disease with ischemic cardiomyopathy, EF 30% Echocardiogram pending in setting of shock P: Strict intake and output Daily weight Hold home diuretics continuous telemetry Close monitoring of hemodynamics in the ICU setting Repeat echo pending  Atrial fibrillation with RVR P: Continuous telemetry Hold rate  control medications Rate currently well controlled Continue IV amiodarone drip  Hyponatremia - hypo-osmolar and hypovolemic P: Enteral hydration Treated Bumex  Acute metabolic encephalopathy - somnolent and lethargic, may  be secondary to hyponatremia -Signs of obstructive breathing pattern - UDS positive for methamphetamins and marijuana on admission P: BiPAP Frequent neurochecks Obtain ABG if mentation worsens on BiPAP Minimize sedation Supportive care  Type 2 diabetes -Home medications include metformin and glipizide: P Hold home medications SSI CBG every 4  Best practice   Diet:  Oral Pain/Anxiety/Delirium protocol (if indicated): No VAP protocol (if indicated): Not indicated DVT prophylaxis: Systemic AC GI prophylaxis: N/A Glucose control:  SSI Yes Central venous access:  Yes, and it is still needed Arterial line:  N/A Foley:  N/A Mobility:  bed rest  PT consulted: N/A Last date of multidisciplinary goals of care discussion []  Code Status:  full code Disposition: ICU  Critical care time:    Performed by:  Total critical care time: 35 minutes  Critical care time was exclusive of separately billable procedures and treating other patients.  Critical care was necessary to treat or prevent imminent or life-threatening deterioration.  Critical care was time spent personally by me on the following activities: development of treatment plan with patient and/or surrogate as well as nursing, discussions with consultants, evaluation of patient's response to treatment, examination of patient, obtaining history from patient or surrogate, ordering and performing treatments and interventions, ordering and review of laboratory studies, ordering and review of radiographic studies, pulse oximetry and re-evaluation of patient's condition.  Delfin Gant, NP-C [facility] Personal contact information can be found on Amion  If no response please page: Adult pulmonary and critical care medicine pager on Amion unitl 7pm After 7pm please call [phone] 02/15/2021, 10:11 AM

## 2021-02-15 NOTE — Progress Notes (Signed)
ANTICOAGULATION CONSULT NOTE   Pharmacy Consult for Heparin Indication: atrial fibrillation with possible LE embolus  Allergies  Allergen Reactions  . Codeine Itching  . Bee Venom Other (See Comments)    "Feels like his head is on fire when he goes outside in the sun"  . Hydrocodone-Acetaminophen Other (See Comments)  . Other Other (See Comments)    "Feels like his head is on fire when he goes outside in the sun"  . Cephalexin   . Vicodin [Hydrocodone-Acetaminophen] Itching  . Zocor [Simvastatin] Other (See Comments)    Myalgias    Patient Measurements: Weight: 90.7 kg (200 lb)  Vital Signs: BP: 98/61 (04/22 0315) Pulse Rate: 132 (04/22 0315)  Labs: Recent Labs    02/14/21 2127 02/15/21 0250  HGB 12.5* 12.6*  HCT 37.5* 38.5*  PLT 216 226  APTT 46* 43*  LABPROT 18.1*  --   INR 1.5*  --   HEPARINUNFRC  --  0.21*  CREATININE 0.99 1.01    Estimated Creatinine Clearance: 94.1 mL/min (by C-G formula based on SCr of 1.01 mg/dL).   Medical History: Past Medical History:  Diagnosis Date  . Acute combined systolic and diastolic CHF, NYHA class 1 (HCC) 08/07/2016  . Anemia   . Atrial fibrillation (HCC)   . BACK PAIN 02/11/2010   Qualifier: Diagnosis of  By: Manson Passey, RN, BSN, Lauren    . CAD (coronary artery disease) 02/11/2010   Qualifier: Diagnosis of  By: Manson Passey, RN, BSN, Lauren    . Cellulitis 09/29/2017  . Cerebrovascular disease   . CHEST PAIN 02/11/2010   Qualifier: Diagnosis of  By: Manson Passey, RN, BSN, Lauren    . Chronic anticoagulation 03/25/2018  . Chronic pain syndrome   . Chronic systolic congestive heart failure (HCC) 03/25/2018  . Community acquired pneumonia 08/11/2016  . DEPRESSION/ANXIETY 02/11/2010   Qualifier: Diagnosis of  By: Manson Passey, RN, BSN, Lauren    . Diabetes mellitus, type 2 (HCC) 02/11/2010   Qualifier: Diagnosis of  By: Manson Passey, RN, BSN, Lauren    . Diabetic ulcer of toe of right foot associated with type 2 diabetes mellitus (HCC) 10/01/2017  .  ESOPHAGEAL STRICTURE 02/11/2010   Qualifier: Diagnosis of  By: Manson Passey, RN, BSN, Lauren    . GERD (gastroesophageal reflux disease)   . HEADACHE 02/11/2010   Qualifier: Diagnosis of  By: Manson Passey, RN, BSN, Lauren    . History of CVA (cerebrovascular accident) 09/29/2017  . History of drug abuse (HCC) 08/05/2016  . Hypercholesteremia   . Hyperlipemia 02/11/2010   Qualifier: Diagnosis of  By: Manson Passey, RN, BSN, Lauren    . Hyperlipidemia   . Hypertension 02/11/2010   Qualifier: Diagnosis of  By: Manson Passey, RN, BSN, Lauren    . HYPERTENSION, UNSPECIFIED 02/11/2010   Qualifier: Diagnosis of  By: Manson Passey, RN, BSN, Lauren    . Hypogonadism male   . 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 Status post thrombectomy  . Osteoarthritis 08/05/2016  . Osteomyelitis, unspecified (HCC) 10/06/2017  . PAF (paroxysmal atrial fibrillation) (HCC) 08/05/2016  . Polysubstance abuse (HCC) 09/29/2017  . Rotator cuff syndrome    Shoulder  . Shoulder pain   . 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 Sleep apnea   . Stroke (HCC) 08/05/2016  . Tobacco use 08/11/2016    Assessment: 60 y.o. gentleman with medical history of DM, HTN, strokes, and A. Fib on rate control and systemic AC who  presents as a transfer from [facility] ED for evaluation of limb ischemic and septic shock.  Per medical record had been on Xarelto, unclear when patient had his last dose. Patient started on Heparin infusion at 1400 units/hr at [place]. Pharmacy consulted to follow heparin drip.  Will monitor HL and aPTT until the correlate since Xarelto will effect the Heparin level.  4/22 AM update:  Heparin level and aPTT low Can DC further aPTT monitoring  Goal of Therapy:  Heparin level 0.3-0.7 units/ml  Monitor platelets by anticoagulation protocol: Yes   Plan:  Inc heparin to 1550 units/hr 1300 heparin level  Abran [facility], PharmD,  BCPS Clinical Pharmacist Phone: [phone]

## 2021-02-15 NOTE — Consult Note (Signed)
Reason for Consult:Left foot infection Referring Physician: Adaline Sill Time called: 1406 Time at bedside: 1428   Fernando Maddox is an 60 y.o. male.  HPI: Fernando Maddox was admitted yesterday with AMS likely stemming from sepsis. He was noted to have significant skin changes of both feet, L>R. Vascular surgery was consulted and evaluated patient but he did not have an arterial occlusion and recommended orthopedic surgery consultation. The patient remains somewhat obtunded but able to arouse and answer some simple questions. He says foot issues have only been going on for about 3 days. He is mostly insensate and states the foot hasn't been bothering him much.  Past Medical History:  Diagnosis Date  . Acute combined systolic and diastolic CHF, NYHA class 1 (HCC) 08/07/2016  . Anemia   . Atrial fibrillation (HCC)   . BACK PAIN 02/11/2010   Qualifier: Diagnosis of  By: Manson Passey, RN, BSN, Lauren    . CAD (coronary artery disease) 02/11/2010   Qualifier: Diagnosis of  By: Manson Passey, RN, BSN, Lauren    . Cellulitis 09/29/2017  . Cerebrovascular disease   . CHEST PAIN 02/11/2010   Qualifier: Diagnosis of  By: Manson Passey, RN, BSN, Lauren    . Chronic anticoagulation 03/25/2018  . Chronic pain syndrome   . Chronic systolic congestive heart failure (HCC) 03/25/2018  . Community acquired pneumonia 08/11/2016  . DEPRESSION/ANXIETY 02/11/2010   Qualifier: Diagnosis of  By: Manson Passey, RN, BSN, Lauren    . Diabetes mellitus, type 2 (HCC) 02/11/2010   Qualifier: Diagnosis of  By: Manson Passey, RN, BSN, Lauren    . Diabetic ulcer of toe of right foot associated with type 2 diabetes mellitus (HCC) 10/01/2017  . ESOPHAGEAL STRICTURE 02/11/2010   Qualifier: Diagnosis of  By: Manson Passey, RN, BSN, Lauren    . GERD (gastroesophageal reflux disease)   . HEADACHE 02/11/2010   Qualifier: Diagnosis of  By: Manson Passey, RN, BSN, Lauren    . History of CVA (cerebrovascular accident) 09/29/2017  . History of drug abuse (HCC) 08/05/2016  . Hypercholesteremia   .  Hyperlipemia 02/11/2010   Qualifier: Diagnosis of  By: Manson Passey, RN, BSN, Lauren    . Hyperlipidemia   . Hypertension 02/11/2010   Qualifier: Diagnosis of  By: Manson Passey, RN, BSN, Lauren    . HYPERTENSION, UNSPECIFIED 02/11/2010   Qualifier: Diagnosis of  By: Manson Passey, RN, BSN, Lauren    . Hypogonadism male   . 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 Status post thrombectomy  . Osteoarthritis 08/05/2016  . Osteomyelitis, unspecified (HCC) 10/06/2017  . PAF (paroxysmal atrial fibrillation) (HCC) 08/05/2016  . Polysubstance abuse (HCC) 09/29/2017  . Rotator cuff syndrome    Shoulder  . Shoulder pain   . 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 Sleep apnea   . Stroke (HCC) 08/05/2016  . Tobacco use 08/11/2016    Past Surgical History:  Procedure Laterality Date  . NO PAST SURGERIES      Family History  Problem Relation Age of Onset  . Hypertension Mother   . Hypertension Father     Social History:  reports that he has quit smoking. His smoking use included cigarettes. He started smoking about 4 years ago. He quit smokeless tobacco use about 4 years ago. No history on file for alcohol use and drug use.  Allergies:  Allergies  Allergen Reactions  . Codeine Itching  . Bee Venom Other (See Comments)    "Feels  like his head is on fire when he goes outside in the sun"  . Hydrocodone-Acetaminophen Other (See Comments)  . Other Other (See Comments)    "Feels like his head is on fire when he goes outside in the sun"  . Cephalexin   . Vicodin [Hydrocodone-Acetaminophen] Itching  . Zocor [Simvastatin] Other (See Comments)    Myalgias    Medications: I have reviewed the patient's current medications.  Results for orders placed or performed during the hospital encounter of 02/14/21 (from the past 48 hour(s))  Surgical pcr screen     Status: Abnormal   Collection Time: 02/14/21  8:00  PM   Specimen: Nasal Mucosa; Nasal Swab  Result Value Ref Range   MRSA, PCR NEGATIVE NEGATIVE   Staphylococcus aureus POSITIVE (A) NEGATIVE    Comment: (NOTE) The Xpert SA Assay (FDA approved for NASAL specimens in patients 46 years of age and older), is one component of a comprehensive surveillance program. It is not intended to diagnose infection nor to guide or monitor treatment. Performed at [facility], [address], [place], [place] [address]   HIV Antibody (routine testing w rflx)     Status: None   Collection Time: 02/14/21  9:27 PM  Result Value Ref Range   HIV Screen 4th Generation wRfx Non Reactive Non Reactive    Comment: Performed at [facility], [address], [place], [place] [address]  Comprehensive metabolic panel     Status: Abnormal   Collection Time: 02/14/21  9:27 PM  Result Value Ref Range   Sodium 126 (L) 135 - 145 mmol/L   Potassium 2.9 (L) 3.5 - 5.1 mmol/L   Chloride 94 (L) 98 - 111 mmol/L   CO2 24 22 - 32 mmol/L   Glucose, Bld 167 (H) 70 - 99 mg/dL    Comment: Glucose reference range applies only to samples taken after fasting for at least 8 hours.   BUN 12 6 - 20 mg/dL   Creatinine, Ser 4.82 0.61 - 1.24 mg/dL   Calcium 7.9 (L) 8.9 - 10.3 mg/dL   Total Protein 5.2 (L) 6.5 - 8.1 g/dL   Albumin 2.6 (L) 3.5 - 5.0 g/dL   AST 20 15 - 41 U/L   ALT 23 0 - 44 U/L   Alkaline Phosphatase 72 38 - 126 U/L   Total Bilirubin 1.8 (H) 0.3 - 1.2 mg/dL   GFR, Estimated >50 >03 mL/min    Comment: (NOTE) Calculated using the CKD-EPI Creatinine Equation (2021)    Anion gap 8 5 - 15    Comment: Performed at [facility], [address] [place], [place] [address]  Magnesium     Status: None   Collection Time: 02/14/21  9:27 PM  Result Value Ref Range   Magnesium 1.8 1.7 - 2.4 mg/dL    Comment: Performed at [facility], [address] [place], [place] [address]  Phosphorus     Status: None   Collection Time: 02/14/21  9:27 PM  Result  Value Ref Range   Phosphorus 2.8 2.5 - 4.6 mg/dL    Comment: Performed at [facility], [address] [place], [place] [address]  Lactic acid, plasma     Status: None   Collection Time: 02/14/21  9:27 PM  Result Value Ref Range   Lactic Acid, Venous 1.6 0.5 - 1.9 mmol/L    Comment: Performed at [facility] [place] [facility], [address], [place], [place]  [address]  CBC     Status: Abnormal   Collection Time: 02/14/21  9:27 PM  Result Value Ref Range   WBC 15.3 (H) 4.0 - 10.5 K/uL   RBC 4.47 4.22 - 5.81 MIL/uL   Hemoglobin 12.5 (L) 13.0 - 17.0 g/dL   HCT 60.4 (L) 54.0 - 98.1 %   MCV 83.9 80.0 - 100.0 fL   MCH 28.0 26.0 - 34.0 pg   MCHC 33.3 30.0 - 36.0 g/dL   RDW 19.1 (H) 47.8 - 29.5 %   Platelets 216 150 - 400 K/uL   nRBC 0.0 0.0 - 0.2 %    Comment: Performed at [facility], [address], [place], [place] [address]  Protime-INR     Status: Abnormal   Collection Time: 02/14/21  9:27 PM  Result Value Ref Range   Prothrombin Time 18.1 (H) 11.4 - 15.2 seconds   INR 1.5 (H) 0.8 - 1.2    Comment: (NOTE) INR goal varies based on device and disease states. Performed at [facility], [address], [place], [place] [address]   APTT     Status: Abnormal   Collection Time: 02/14/21  9:27 PM  Result Value Ref Range   aPTT 46 (H) 24 - 36 seconds    Comment:        IF BASELINE aPTT IS ELEVATED, SUGGEST PATIENT RISK ASSESSMENT BE USED TO DETERMINE APPROPRIATE ANTICOAGULANT THERAPY. Performed at [facility], [address] [place], [place] [address]   Basic metabolic panel     Status: Abnormal   Collection Time: 02/15/21  2:50 AM  Result Value Ref Range   Sodium 124 (L) 135 - 145 mmol/L   Potassium 5.4 (H) 3.5 - 5.1 mmol/L   Chloride 94 (L) 98 - 111 mmol/L   CO2 22 22 - 32 mmol/L   Glucose, Bld 170 (H) 70 - 99 mg/dL    Comment: Glucose reference range applies only to samples taken after fasting for at least 8 hours.   BUN 12 6 - 20 mg/dL   Creatinine, Ser  9.52 0.61 - 1.24 mg/dL   Calcium 7.7 (L) 8.9 - 10.3 mg/dL   GFR, Estimated >84 >13 mL/min    Comment: (NOTE) Calculated using the CKD-EPI Creatinine Equation (2021)    Anion gap 8 5 - 15    Comment: Performed at [facility], [address], [place], [place] [address]  Lactic acid, plasma     Status: None   Collection Time: 02/15/21  2:50 AM  Result Value Ref Range   Lactic Acid, Venous 1.6 0.5 - 1.9 mmol/L    Comment: Performed at [facility], [address] [place], [place] [address]  APTT     Status: Abnormal   Collection Time: 02/15/21  2:50 AM  Result Value Ref Range   aPTT 43 (H) 24 - 36 seconds    Comment:        IF BASELINE aPTT IS ELEVATED, SUGGEST PATIENT RISK ASSESSMENT BE USED TO DETERMINE APPROPRIATE ANTICOAGULANT THERAPY. Performed at [facility], [address] [place], [place] [address]   Heparin level (unfractionated)     Status: Abnormal   Collection Time: 02/15/21  2:50 AM  Result Value Ref Range   Heparin Unfractionated 0.21 (L) 0.30 - 0.70 IU/mL    Comment: (NOTE) The clinical reportable range upper limit is being lowered to >1.10 to align with the FDA approved guidance for the current  laboratory assay.  If heparin results are below expected values, and patient dosage has  been confirmed, suggest follow up testing of antithrombin III levels. Performed at [facility], [address], [place], [place] [address]   CBC     Status: Abnormal   Collection Time: 02/15/21  2:50 AM  Result Value Ref Range   WBC 16.1 (H) 4.0 - 10.5 K/uL   RBC 4.55 4.22 - 5.81 MIL/uL   Hemoglobin 12.6 (L) 13.0 - 17.0 g/dL   HCT 98.9 (L) 21.1 - 94.1 %   MCV 84.6 80.0 - 100.0 fL   MCH 27.7 26.0 - 34.0 pg   MCHC 32.7 30.0 - 36.0 g/dL   RDW 74.0 (H) 81.4 - 48.1 %   Platelets 226 150 - 400 K/uL   nRBC 0.0 0.0 - 0.2 %    Comment: Performed at [facility], [address], [place], [place] [address]  Basic metabolic panel     Status: Abnormal    Collection Time: 02/15/21  5:31 AM  Result Value Ref Range   Sodium 126 (L) 135 - 145 mmol/L   Potassium 3.7 3.5 - 5.1 mmol/L   Chloride 91 (L) 98 - 111 mmol/L   CO2 20 (L) 22 - 32 mmol/L   Glucose, Bld 188 (H) 70 - 99 mg/dL    Comment: Glucose reference range applies only to samples taken after fasting for at least 8 hours.   BUN 11 6 - 20 mg/dL   Creatinine, Ser 4.97 0.61 - 1.24 mg/dL   Calcium 7.5 (L) 8.9 - 10.3 mg/dL   GFR, Estimated >02 >63 mL/min    Comment: (NOTE) Calculated using the CKD-EPI Creatinine Equation (2021)    Anion gap 15 5 - 15    Comment: Performed at [facility], [address] [place], [place] [address]  Heparin level (unfractionated)     Status: Abnormal   Collection Time: 02/15/21  1:22 PM  Result Value Ref Range   Heparin Unfractionated 0.25 (L) 0.30 - 0.70 IU/mL    Comment: (NOTE) The clinical reportable range upper limit is being lowered to >1.10 to align with the FDA approved guidance for the current laboratory assay.  If heparin results are below expected values, and patient dosage has  been confirmed, suggest follow up testing of antithrombin III levels. Performed at [facility], [address], [place], [place] [address]     VAS [place] ABI WITH/WO TBI  Result Date: 02/15/2021  LOWER EXTREMITY DOPPLER STUDY Patient Name:  LYNDA CAPISTRAN  Date of Exam:   02/15/2021 Medical Rec #: [id number]      Accession #:    [id number] Date of Birth: 12-Jul-1961      Patient Gender: M Patient Age:   59Y Exam Location:  [facility] Procedure:      VAS [place] ABI WITH/WO TBI Referring Phys: [id number] Nada Libman --------------------------------------------------------------------------------  High Risk Factors: Hypertension, hyperlipidemia, Diabetes, current smoker,                    coronary artery disease, prior CVA. Other Factors: CHF, Afib, Polysubstance abuse.  Comparison Study: Previous exam 04/15/18 -WNL Performing Technologist: Ernestene Mention RVT, RDMS   Examination Guidelines: A complete evaluation includes at minimum, Doppler waveform signals and systolic blood pressure reading at the level of bilateral brachial, anterior tibial, and posterior tibial arteries, when vessel segments are accessible. Bilateral testing is considered an integral part of a  complete examination. Photoelectric Plethysmograph (PPG) waveforms and toe systolic pressure readings are included as required and additional duplex testing as needed. Limited examinations for reoccurring indications may be performed as noted.  ABI Findings: +--------+------------------+-----+----------------+--------+ Right   Rt Pressure (mmHg)IndexWaveform        Comment  +--------+------------------+-----+----------------+--------+ [id number]                    biphasic                 +--------+------------------+-----+----------------+--------+ PTA     117               1.04 audibly biphasic         +--------+------------------+-----+----------------+--------+ DP      111               0.98 audibly biphasic         +--------+------------------+-----+----------------+--------+ +--------+------------------+-----+----------------+-------+ Left    Lt Pressure (mmHg)IndexWaveform        Comment +--------+------------------+-----+----------------+-------+ [id number]                    triphasic               +--------+------------------+-----+----------------+-------+ PTA     106               0.94 audibly biphasic        +--------+------------------+-----+----------------+-------+ DP      115               1.02 audibly biphasic        +--------+------------------+-----+----------------+-------+ +-------+-----------+-----------+------------+------------+ ABI/TBIToday's ABIToday's TBIPrevious ABIPrevious TBI +-------+-----------+-----------+------------+------------+ Right  1.04       0          1.23                      +-------+-----------+-----------+------------+------------+ Left   1.02       0          1.15                     +-------+-----------+-----------+------------+------------+ Doppler readings are unreliable due to patient constantly moving and Afib.  Summary: Right: Resting right ankle-brachial index is within normal range. No evidence of significant right lower extremity arterial disease. The right toe-brachial index is absent. Left: Resting left ankle-brachial index is within normal range. No evidence of significant left lower extremity arterial disease. The left toe-brachial index is absent.  *See table(s) above for measurements and observations.     Preliminary    VAS [place] LOWER EXTREMITY ARTERIAL DUPLEX  Result Date: 02/15/2021 LOWER EXTREMITY ARTERIAL DUPLEX STUDY Patient Name:  REX OESTERLE  Date of Exam:   02/15/2021 Medical Rec #: [id number]      Accession #:    [id number] Date of Birth: 01/15/1961      Patient Gender: M Patient Age:   59Y Exam Location:  [facility] Procedure:      VAS [place] LOWER EXTREMITY ARTERIAL DUPLEX Referring Phys: [id number] Nada Libman --------------------------------------------------------------------------------  Indications: Gangrene, peripheral artery disease, and Follow up CTA findings-              concern for mid SFA occlusive disease. High Risk Factors: Hypertension, hyperlipidemia, Diabetes, coronary artery                    disease, prior CVA.  Current ABI: RT 1.04/0.00 LT 1.02/0.00 Comparison Study: 02-15-2021 Most recent ABI w/ TBI study, 04-15-2018  prior ABI                   was RT 1.23 LT 1.15 Performing Technologist: Jean Rosenthal RDMS,RVT  Examination Guidelines: A complete evaluation includes B-mode imaging, spectral Doppler, color Doppler, and power Doppler as needed of all accessible portions of each vessel. Bilateral testing is considered an integral part of a complete examination. Limited examinations for reoccurring indications may be performed as  noted.  +-----------+--------+-----+--------+---------+--------+ RIGHT      PSV cm/sRatioStenosisWaveform Comments +-----------+--------+-----+--------+---------+--------+ CFA Prox   87                   triphasic         +-----------+--------+-----+--------+---------+--------+ CFA Mid    70                   triphasic         +-----------+--------+-----+--------+---------+--------+ CFA Distal 68                   triphasic         +-----------+--------+-----+--------+---------+--------+ DFA        60                   biphasic          +-----------+--------+-----+--------+---------+--------+ SFA Prox   78                   triphasic         +-----------+--------+-----+--------+---------+--------+ SFA Mid    70                   triphasic         +-----------+--------+-----+--------+---------+--------+ SFA Distal 70                   triphasic         +-----------+--------+-----+--------+---------+--------+ POP Prox   71                   triphasic         +-----------+--------+-----+--------+---------+--------+ POP Mid    53                   triphasic         +-----------+--------+-----+--------+---------+--------+ POP Distal 54                   triphasic         +-----------+--------+-----+--------+---------+--------+ TP Trunk   51                   triphasic         +-----------+--------+-----+--------+---------+--------+ ATA Distal 36                   biphasic          +-----------+--------+-----+--------+---------+--------+ PTA Distal 45                   biphasic          +-----------+--------+-----+--------+---------+--------+ PERO Distal35                   biphasic          +-----------+--------+-----+--------+---------+--------+ DP         34                   biphasic          +-----------+--------+-----+--------+---------+--------+  Summary: Right: No evidence of stenosis or occlusive  disease.  See table(s) above for measurements and observations.    Preliminary    VAS US LOWER EXTREMITY VENOUS (DVT)  Result Date: 02/15/2021  Lower Venous DVT Study Patient Name:  Nani SkillernGARY L Vandergrift  Date of Exam:   02/15/2021 Medical Rec #: [id number]      Accession #:    [id number] Date of Birth: 07/20/1961      Patient Gender: M Patient Age:   59Y [facility] Procedure:      VAS US LOWER EXTREMITY VENOUS (DVT) Referring Phys: [id number] Nada LibmanVANCE W BRABHAM --------------------------------------------------------------------------------  Indications: Erythema and edema,.  Anticoagulation: Heparin. Limitations: Poor ultrasound/tissue interface. Comparison Study: No prior studies. Performing Technologist: Jean Rosenthalachel Hodge RDMS,RVT  Examination Guidelines: A complete evaluation includes B-mode imaging, spectral Doppler, color Doppler, and power Doppler as needed of all accessible portions of each vessel. Bilateral testing is considered an integral part of a complete examination. Limited examinations for reoccurring indications may be performed as noted. The reflux portion of the exam is performed with the patient in reverse Trendelenburg.  +-----+---------------+---------+-----------+----------+--------------+ RIGHTCompressibilityPhasicitySpontaneityPropertiesThrombus Aging +-----+---------------+---------+-----------+----------+--------------+ CFV  Full           Yes      Yes                                 +-----+---------------+---------+-----------+----------+--------------+   +---------+---------------+---------+-----------+----------+-------------------+ LEFT     CompressibilityPhasicitySpontaneityPropertiesThrombus Aging      +---------+---------------+---------+-----------+----------+-------------------+ CFV      Full           Yes      Yes                                      +---------+---------------+---------+-----------+----------+-------------------+ SFJ      Full                                                              +---------+---------------+---------+-----------+----------+-------------------+ FV Prox  Full                                                             +---------+---------------+---------+-----------+----------+-------------------+ FV Mid   Full                                                             +---------+---------------+---------+-----------+----------+-------------------+ FV DistalFull                                                             +---------+---------------+---------+-----------+----------+-------------------+ PFV      Full                                                             +---------+---------------+---------+-----------+----------+-------------------+  POP      Full           Yes      Yes                                      +---------+---------------+---------+-----------+----------+-------------------+ PTV      Full                                                             +---------+---------------+---------+-----------+----------+-------------------+ PERO                    Yes      Yes                  Not well visualized +---------+---------------+---------+-----------+----------+-------------------+    Summary: RIGHT: - No evidence of common femoral vein obstruction.  LEFT: - There is no evidence of deep vein thrombosis in the lower extremity. However, portions of this examination were limited- see technologist comments above.  - No cystic structure found in the popliteal fossa.  *See table(s) above for measurements and observations.    Preliminary     Review of Systems  Unable to perform ROS: Mental status change   Blood pressure 108/72, pulse 87, temperature 99.1 F (37.3 C), temperature source Bladder, resp. rate (!) 22, weight 96.4 kg, SpO2 96 %. Physical Exam Constitutional:      General: He is not in acute distress.    Appearance: He is  well-developed. He is not diaphoretic.  HENT:     Head: Normocephalic and atraumatic.  Eyes:     General: No scleral icterus.       Right eye: No discharge.        Left eye: No discharge.     Conjunctiva/sclera: Conjunctivae normal.  Cardiovascular:     Rate and Rhythm: Normal rate and regular rhythm.  Pulmonary:     Effort: Pulmonary effort is normal. No respiratory distress.  Musculoskeletal:     Cervical back: Normal range of motion.  Feet:     Comments: Left foot: Edematous, erythematous, essentially insensate. No palpable DP/PT pulses. Large area necrosis, fluctuance, purulent malodorous discharge dorsal forefoot. Skin:    General: Skin is warm and dry.  Neurological:     Mental Status: He is alert.  Psychiatric:        Behavior: Behavior normal.     Assessment/Plan: Left foot wounds -- X-ray ordered, suspect pt will need amputation. Dr. Lajoyce Corners to evaluate.    Freeman Caldron, PA-C Orthopedic Surgery [phone] 02/15/2021, 2:54 PM

## 2021-02-15 NOTE — Progress Notes (Signed)
PCCM Progress Note   Notified by nurse of worsening cyanosis of right foot.DP is very minimally dopplerable, PT remains strong on doppler.      >>>   >>>   Will notify vascular surgery and continue to closely follow neurovascular checks    Delfin Gant, NP-C [facility] Personal contact information can be found on Amion  02/15/2021, 5:51 PM

## 2021-02-15 NOTE — Consult Note (Addendum)
WOC Nurse Consult Note: Patient receiving care in [facility] [phone]. Reason for Consult: left foot wound Wound type: unknown at this time. Possibly infectious, ?DVT, ? ischemia Pressure Injury POA: Yes/No/NA Measurement: na Wound bed: wet gangrenous appearing left toes Drainage (amount, consistency, odor)  Periwound: edematous, erythematous. Edema and erythema continues up the leg to at least the thigh. Dressing procedure/placement/frequency: Wash between toes of left foot with saline. Dry. Cut Aquacel [place] [phone]) into strips. Weave between toes. Change daily and when soiled. Topical therapy alone will not solve the problems associated with the left foot and toes. Thank you for the consult.  Discussed plan of care with the bedside nurse.  WOC nurse will not follow at this time.  Please re-consult the WOC team if needed.  Helmut Muster, RN, MSN, CWOCN, CNS-BC, pager [phone]

## 2021-02-15 NOTE — Progress Notes (Signed)
Pharmacy Antibiotic Note  Fernando Maddox is a 60 y.o. male admitted on 02/14/2021 with wound infection.  Pharmacy has been consulted for Vancomycin dosing. Transfer from [facility]. WBC elevated. Renal function ok.   Anti-biotics at [facility]:  Vancomycin 2000 mg IV x 1 4/21 at 1130 Zosyn 4.5g IV x 1 4/21 at 1114  Plan: Vancomycin 1250 mg IV q12h >>Estimated AUC: 454 Zosyn 3.375G IV q8h to be infused over 4 hours (per MD) Trend WBC, temp, renal function  F/U infectious work-up Drug levels as indicated   Weight: 90.7 kg (200 lb)  No data recorded.  Recent Labs  Lab 02/14/21 2127  WBC 15.3*  CREATININE 0.99  LATICACIDVEN 1.6    Estimated Creatinine Clearance: 96 mL/min (by C-G formula based on SCr of 0.99 mg/dL).    Allergies  Allergen Reactions  . Codeine Itching  . Bee Venom Other (See Comments)    "Feels like his head is on fire when he goes outside in the sun"  . Hydrocodone-Acetaminophen Other (See Comments)  . Other Other (See Comments)    "Feels like his head is on fire when he goes outside in the sun"  . Cephalexin   . Vicodin [Hydrocodone-Acetaminophen] Itching  . Zocor [Simvastatin] Other (See Comments)    Myalgias    Abran Duke, PharmD, BCPS Clinical Pharmacist Phone: [phone]

## 2021-02-15 NOTE — Progress Notes (Signed)
Dr. Lenell Antu notified of worsening purple discoloration of right foot. PT dopplerable, pedal intermittently dopplerable. Will continue to monitor and notify vascular of any acute changes in extremity.

## 2021-02-15 NOTE — Progress Notes (Signed)
Lower extremity venous LT and lower extremity arterial RT study completed.   Please see CV Proc for preliminary results.   Jean Rosenthal, RDMS, RVT

## 2021-02-16 DIAGNOSIS — J9621 Acute and chronic respiratory failure with hypoxia: Secondary | ICD-10-CM | POA: Diagnosis not present

## 2021-02-16 DIAGNOSIS — L97524 Non-pressure chronic ulcer of other part of left foot with necrosis of bone: Secondary | ICD-10-CM

## 2021-02-16 DIAGNOSIS — A419 Sepsis, unspecified organism: Secondary | ICD-10-CM | POA: Diagnosis not present

## 2021-02-16 DIAGNOSIS — R6521 Severe sepsis with septic shock: Secondary | ICD-10-CM | POA: Diagnosis not present

## 2021-02-16 LAB — BASIC METABOLIC PANEL
Anion gap: 12 (ref 5–15)
BUN: 18 mg/dL (ref 6–20)
CO2: 20 mmol/L — ABNORMAL LOW (ref 22–32)
Calcium: 7.9 mg/dL — ABNORMAL LOW (ref 8.9–10.3)
Chloride: 91 mmol/L — ABNORMAL LOW (ref 98–111)
Creatinine, Ser: 1.24 mg/dL (ref 0.61–1.24)
GFR, Estimated: >60 mL/min (ref >60)
Glucose, Bld: 82 mg/dL (ref 70–99)
Potassium: 3.9 mmol/L (ref 3.5–5.1)
Sodium: 123 mmol/L — ABNORMAL LOW (ref 135–145)

## 2021-02-16 LAB — CBC
HCT: 38.7 % — ABNORMAL LOW (ref 39.0–52.0)
Hemoglobin: 12.7 g/dL — ABNORMAL LOW (ref 13.0–17.0)
MCH: 28.3 pg (ref 26.0–34.0)
MCHC: 32.8 g/dL (ref 30.0–36.0)
MCV: 86.2 fL (ref 80.0–100.0)
Platelets: 222 10*3/uL (ref 150–400)
RBC: 4.49 MIL/uL (ref 4.22–5.81)
RDW: 16.7 % — ABNORMAL HIGH (ref 11.5–15.5)
WBC: 11.4 10*3/uL — ABNORMAL HIGH (ref 4.0–10.5)
nRBC: 0 % (ref 0.0–0.2)

## 2021-02-16 LAB — HEMOGLOBIN A1C
Hgb A1c MFr Bld: 7.8 % — ABNORMAL HIGH (ref 4.8–5.6)
Mean Plasma Glucose: 177.16 mg/dL

## 2021-02-16 LAB — AMMONIA: Ammonia: 27 umol/L (ref 9–35)

## 2021-02-16 LAB — GLUCOSE, CAPILLARY
Glucose-Capillary: 90 mg/dL (ref 70–99)
Glucose-Capillary: 74 mg/dL (ref 70–99)
Glucose-Capillary: 84 mg/dL (ref 70–99)
Glucose-Capillary: 140 mg/dL — ABNORMAL HIGH (ref 70–99)
Glucose-Capillary: 174 mg/dL — ABNORMAL HIGH (ref 70–99)

## 2021-02-16 LAB — MAGNESIUM: Magnesium: 1.8 mg/dL (ref 1.7–2.4)

## 2021-02-16 LAB — HEPARIN LEVEL (UNFRACTIONATED): Heparin Unfractionated: 0.43 IU/mL (ref 0.30–0.70)

## 2021-02-16 LAB — TSH: TSH: 4.463 u[IU]/mL (ref 0.350–4.500)

## 2021-02-16 MED ORDER — POTASSIUM CHLORIDE CRYS ER 20 MEQ PO TBCR
40.0000 meq | EXTENDED_RELEASE_TABLET | Freq: Once | ORAL | Status: AC
  Administered 2021-02-16: 40 meq via ORAL
  Filled 2021-02-16: qty 2

## 2021-02-16 MED ORDER — MAGNESIUM SULFATE 2 GM/50ML IV SOLN
2.0000 g | Freq: Once | INTRAVENOUS | Status: AC
  Administered 2021-02-16: 2 g via INTRAVENOUS
  Filled 2021-02-16: qty 50

## 2021-02-16 MED ORDER — FUROSEMIDE 10 MG/ML IJ SOLN
40.0000 mg | Freq: Once | INTRAMUSCULAR | Status: AC
  Administered 2021-02-16: 40 mg via INTRAVENOUS
  Filled 2021-02-16: qty 4

## 2021-02-16 MED ORDER — CLONAZEPAM 0.5 MG PO TBDP
1.0000 mg | ORAL_TABLET | Freq: Once | ORAL | Status: AC | PRN
  Administered 2021-02-16: 1 mg via ORAL
  Filled 2021-02-16: qty 2

## 2021-02-16 NOTE — H&P (View-Only) (Signed)
  ORTHOPAEDIC CONSULTATION  REQUESTING PHYSICIAN: Smith, Daniel C, MD  Chief Complaint: Ulceration purulent drainage left forefoot.  HPI: Fernando Maddox is a 59 y.o. male who presents with necrotic ulceration and purulent drainage from the left forefoot involving the great toe second toe and third toe.  Patient is unsure when this started.  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  Past Medical History:  Diagnosis Date  . Acute combined systolic and diastolic CHF, NYHA class 1 (HCC) 08/07/2016  . Anemia   . Atrial fibrillation (HCC)   . BACK PAIN 02/11/2010   Qualifier: Diagnosis of  By: Brown, RN, BSN, Lauren    . CAD (coronary artery disease) 02/11/2010   Qualifier: Diagnosis of  By: Brown, RN, BSN, Lauren    . Cellulitis 09/29/2017  . Cerebrovascular disease   . CHEST PAIN 02/11/2010   Qualifier: Diagnosis of  By: Brown, RN, BSN, Lauren    . Chronic anticoagulation 03/25/2018  . Chronic pain syndrome   . Chronic systolic congestive heart failure (HCC) 03/25/2018  . Community acquired pneumonia 08/11/2016  . DEPRESSION/ANXIETY 02/11/2010   Qualifier: Diagnosis of  By: Brown, RN, BSN, Lauren    . Diabetes mellitus, type 2 (HCC) 02/11/2010   Qualifier: Diagnosis of  By: Brown, RN, BSN, Lauren    . Diabetic ulcer of toe of right foot associated with type 2 diabetes mellitus (HCC) 10/01/2017  . ESOPHAGEAL STRICTURE 02/11/2010   Qualifier: Diagnosis of  By: Brown, RN, BSN, Lauren    . GERD (gastroesophageal reflux disease)   . HEADACHE 02/11/2010   Qualifier: Diagnosis of  By: Brown, RN, BSN, Lauren    . History of CVA (cerebrovascular accident) 09/29/2017  . History of drug abuse (HCC) 08/05/2016  . Hypercholesteremia   . Hyperlipemia 02/11/2010   Qualifier: Diagnosis of  By: Brown, RN, BSN, Lauren    . Hyperlipidemia   . Hypertension 02/11/2010   Qualifier: Diagnosis of  By: Brown, RN, BSN, Lauren    . HYPERTENSION, UNSPECIFIED 02/11/2010   Qualifier:  Diagnosis of  By: Brown, RN, BSN, Lauren    . Hypogonadism male   . 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 Status post thrombectomy  . Osteoarthritis 08/05/2016  . Osteomyelitis, unspecified (HCC) 10/06/2017  . PAF (paroxysmal atrial fibrillation) (HCC) 08/05/2016  . Polysubstance abuse (HCC) 09/29/2017  . Rotator cuff syndrome    Shoulder  . Shoulder pain   . 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 Sleep apnea   . Stroke (HCC) 08/05/2016  . Tobacco use 08/11/2016   Past Surgical History:  Procedure Laterality Date  . NO PAST SURGERIES     Social History   Socioeconomic History  . Marital status: Married    Spouse name: Not on file  . Number of children: Not on file  . Years of education: Not on file  . Highest education level: Not on file  Occupational History  . Not on file  Tobacco Use  . Smoking status: Former Smoker    Types: Cigarettes    Start date: 11/06/2016  . Smokeless tobacco: Former User    Quit date: 03/06/2016  Substance and Sexual Activity  . Alcohol use: Not on file  . Drug use: Not on file  . Sexual activity: Not on file  Other Topics Concern  . Not on file  Social History Narrative  . Not   on file   Social Determinants of Health   Financial Resource Strain: Not on file  Food Insecurity: Not on file  Transportation Needs: Not on file  Physical Activity: Not on file  Stress: Not on file  Social Connections: Not on file   Family History  Problem Relation Age of Onset  . Hypertension Mother   . Hypertension Father    - negative except otherwise stated in the family history section Allergies  Allergen Reactions  . Codeine Itching  . Bee Venom Other (See Comments)    "Feels like his head is on fire when he goes outside in the sun"  . Hydrocodone-Acetaminophen Other (See Comments)  . Other Other (See Comments)    "Feels like his head  is on fire when he goes outside in the sun"  . Cephalexin   . Vicodin [Hydrocodone-Acetaminophen] Itching  . Zocor [Simvastatin] Other (See Comments)    Myalgias   Prior to Admission medications   Medication Sig Start Date End Date Taking? Authorizing Provider  folic acid (FOLVITE) 1 MG tablet Take 1 mg by mouth daily.   Yes [provider]  losartan (COZAAR) 25 MG tablet Take 25 mg by mouth in the morning and at bedtime.   Yes [provider]  metFORMIN (GLUCOPHAGE) 500 MG tablet Take 500 mg by mouth 2 (two) times daily. 02/04/21  Yes [provider]  metoprolol succinate (TOPROL-XL) 25 MG 24 hr tablet Take 25 mg by mouth daily.  10/22/16  Yes [provider]  rivaroxaban (XARELTO) 20 MG TABS tablet Take 20 mg by mouth daily with supper.   Yes [provider]  spironolactone (ALDACTONE) 50 MG tablet Take 50 mg by mouth daily.   Yes [provider]  furosemide (LASIX) 40 MG tablet Take 40 mg by mouth.    [provider]  glipiZIDE (GLUCOTROL XL) 2.5 MG 24 hr tablet Take 2.5 mg by mouth daily. 05/22/20   [provider]  lisinopril (PRINIVIL,ZESTRIL) 10 MG tablet Take 10 mg by mouth daily. 08/12/16 08/06/20  [provider]  pantoprazole (PROTONIX) 40 MG tablet Take 40 mg by mouth daily.  10/14/16   [provider]  potassium chloride (KLOR-CON) 10 MEQ tablet Take 10 mEq by mouth daily.    [provider]  pravastatin (PRAVACHOL) 40 MG tablet Take 40 mg by mouth daily.    [provider]   DG Chest 1 View  Result Date: 02/15/2021 CLINICAL DATA:  Respiratory failure. EXAM: CHEST  1 VIEW COMPARISON:  February 14, 2021. FINDINGS: Stable cardiomegaly. No pneumothorax or pleural effusion is noted. Stable position of right internal jugular catheter line. No acute pulmonary disease is noted. Old bilateral rib fractures are noted. IMPRESSION: No active disease. Electronically Signed   By: James  Green  Jr M.D.   On: 02/15/2021 15:47   DG Foot 2 Views Left  Result Date: 02/15/2021 CLINICAL DATA:  Foot ulcer. EXAM: LEFT FOOT - 2 VIEW COMPARISON:  None. FINDINGS: There is no evidence of fracture or dislocation. Severe irregularity is seen involving the first interphalangeal joint with lytic destruction of the adjacent articular surfaces of the proximal distal phalanges. This is concerning for possible septic arthritis with adjacent osteomyelitis. Soft tissue swelling of the first toe is noted suggesting cellulitis. Degenerative changes are also seen involving the distal interphalangeal joints of the second and third toes. IMPRESSION: Findings concerning for possible septic arthritis of the first interphalangeal joint with possible osteomyelitis of the adjacent proximal distal   phalanges. MRI may be performed further evaluation. Electronically Signed   By: James  Green Jr M.D.   On: 02/15/2021 15:50   VAS US ABI WITH/WO TBI  Result Date: 02/15/2021  LOWER EXTREMITY DOPPLER STUDY Patient Name:  Darrie L Budge  Date of Exam:   02/15/2021 Medical Rec #: [id number]      Accession #:    [id number] Date of Birth: 12/31/1960      Patient Gender: M Patient Age:   59Y Exam Location:  [facility] Procedure:      VAS US ABI WITH/WO TBI Referring Phys: [id number] VANCE W BRABHAM --------------------------------------------------------------------------------  High Risk Factors: Hypertension, hyperlipidemia, Diabetes, current smoker,                    coronary artery disease, prior CVA. Other Factors: CHF, Afib, Polysubstance abuse.  Comparison Study: Previous exam 04/15/18 -WNL Performing Technologist: Hill, Jody RVT, RDMS  Examination Guidelines: A complete evaluation includes at minimum, Doppler waveform signals and systolic blood pressure reading at the level of bilateral brachial, anterior tibial, and posterior tibial arteries, when vessel segments are accessible. Bilateral testing is considered an integral part of a  complete examination. Photoelectric Plethysmograph (PPG) waveforms and toe systolic pressure readings are included as required and additional duplex testing as needed. Limited examinations for reoccurring indications may be performed as noted.  ABI Findings: +--------+------------------+-----+----------------+--------+ Right   Rt Pressure (mmHg)IndexWaveform        Comment  +--------+------------------+-----+----------------+--------+ Brachial103                    biphasic                 +--------+------------------+-----+----------------+--------+ PTA     117               1.04 audibly biphasic         +--------+------------------+-----+----------------+--------+ DP      111               0.98 audibly biphasic         +--------+------------------+-----+----------------+--------+ +--------+------------------+-----+----------------+-------+ Left    Lt Pressure (mmHg)IndexWaveform        Comment +--------+------------------+-----+----------------+-------+ Brachial113                    triphasic               +--------+------------------+-----+----------------+-------+ PTA     106               0.94 audibly biphasic        +--------+------------------+-----+----------------+-------+ DP      115               1.02 audibly biphasic        +--------+------------------+-----+----------------+-------+ +-------+-----------+-----------+------------+------------+ ABI/TBIToday's ABIToday's TBIPrevious ABIPrevious TBI +-------+-----------+-----------+------------+------------+ Right  1.04       0          1.23                     +-------+-----------+-----------+------------+------------+ Left   1.02       0          1.15                     +-------+-----------+-----------+------------+------------+ Doppler readings are unreliable due to patient constantly moving and Afib.  Summary: Right: Resting right ankle-brachial index is within normal range. No evidence  of significant right lower extremity arterial disease. The right toe-brachial index is   absent. Left: Resting left ankle-brachial index is within normal range. No evidence of significant left lower extremity arterial disease. The left toe-brachial index is absent.  *See table(s) above for measurements and observations.  Electronically signed by Thomas Hawken on 02/15/2021 at 5:25:40 PM.    Final    ECHOCARDIOGRAM COMPLETE  Result Date: 02/15/2021    ECHOCARDIOGRAM REPORT   Patient Name:   Sally L Hufstetler Date of Exam: 02/15/2021 Medical Rec #:  [id number]     Height:       75.0 in Accession #:    [id number]    Weight:       212.5 lb Date of Birth:  03/17/1961     BSA:          2.252 m Patient Age:    59 years      BP:           100/72 mmHg Patient Gender: M             HR:           103 bpm. Exam Location:  Inpatient Procedure: 2D Echo, Color Doppler, Cardiac Doppler and Intracardiac            Opacification Agent Indications:    Stroke  History:        Patient has no prior history of Echocardiogram examinations.                 CHF, Abnormal ECG, Stroke, Arrythmias:Atrial Fibrillation,                 Signs/Symptoms:Chest Pain, Shortness of Breath and Dyspnea; Risk                 Factors:Current Smoker, Diabetes, Hypertension and Dyslipidemia.                 Septic shock.  Sonographer:    Tina West RDCS Referring Phys: [id number] NIKITA S DESAI  Sonographer Comments: Technically difficult study due to poor echo windows and echo performed with patient supine and on artificial respirator. IMPRESSIONS  1. Left ventricular ejection fraction, by estimation, is 20 to 25%. The left ventricle has severely decreased function. The left ventricle demonstrates global hypokinesis. The left ventricular internal cavity size was moderately dilated. Left ventricular diastolic function could not be evaluated. There is the interventricular septum is flattened in systole and diastole, consistent with right ventricular pressure and volume  overload.  2. Right ventricular systolic function is severely reduced. The right ventricular size is moderately enlarged.  3. Left atrial size was moderately dilated.  4. Right atrial size was moderately dilated.  5. The mitral valve is normal in structure. Mild to moderate mitral valve regurgitation.  6. Tricuspid valve regurgitation is mild to moderate.  7. The aortic valve is tricuspid. There is mild calcification of the aortic valve. Aortic valve regurgitation is not visualized.  8. Aortic dilatation noted. There is borderline dilatation of the ascending aorta, measuring 40 mm.  9. The inferior vena cava is dilated in size with <50% respiratory variability, suggesting right atrial pressure of 15 mmHg. FINDINGS  Left Ventricle: Left ventricular ejection fraction, by estimation, is 20 to 25%. The left ventricle has severely decreased function. The left ventricle demonstrates global hypokinesis. Definity contrast agent was given IV to delineate the left ventricular endocardial borders. The left ventricular internal cavity size was moderately dilated. There is no left ventricular hypertrophy. The interventricular septum is flattened in systole and diastole, consistent with right ventricular pressure   and volume overload. Left ventricular diastolic function could not be evaluated due to atrial fibrillation. Left ventricular diastolic function could not be evaluated. Right Ventricle: The right ventricular size is moderately enlarged. No increase in right ventricular wall thickness. Right ventricular systolic function is severely reduced. Left Atrium: Left atrial size was moderately dilated. Right Atrium: Right atrial size was moderately dilated. Pericardium: There is no evidence of pericardial effusion. Mitral Valve: The mitral valve is normal in structure. Mild to moderate mitral valve regurgitation. Tricuspid Valve: The tricuspid valve is normal in structure. Tricuspid valve regurgitation is mild to moderate. Aortic  Valve: The aortic valve is tricuspid. There is mild calcification of the aortic valve. Aortic valve regurgitation is not visualized. Pulmonic Valve: The pulmonic valve was grossly normal. Pulmonic valve regurgitation is not visualized. Aorta: Aortic dilatation noted. There is borderline dilatation of the ascending aorta, measuring 40 mm. Venous: The inferior vena cava is dilated in size with less than 50% respiratory variability, suggesting right atrial pressure of 15 mmHg. IAS/Shunts: No atrial level shunt detected by color flow Doppler.  LEFT VENTRICLE PLAX 2D LVIDd:         6.20 cm LVIDs:         5.50 cm LV PW:         1.60 cm LV IVS:        1.30 cm  LV Volumes (MOD) LV vol d, MOD A2C: 162.0 ml LV vol d, MOD A4C: 166.0 ml LV vol s, MOD A2C: 116.0 ml LV vol s, MOD A4C: 129.0 ml LV SV MOD A2C:     46.0 ml LV SV MOD A4C:     166.0 ml LV SV MOD BP:      42.7 ml RIGHT VENTRICLE            IVC RV S prime:     4.43 cm/s  IVC diam: 3.00 cm TAPSE (M-mode): 0.8 cm LEFT ATRIUM              Index       RIGHT ATRIUM           Index LA diam:        4.90 cm  2.18 cm/m  RA Area:     21.60 cm LA Vol (A2C):   103.0 ml 45.74 ml/m RA Volume:   63.70 ml  28.29 ml/m LA Vol (A4C):   101.0 ml 44.85 ml/m LA Biplane Vol: 105.0 ml 46.63 ml/m  AORTIC VALVE LVOT Vmax:   66.20 cm/s LVOT Vmean:  47.000 cm/s LVOT VTI:    0.092 m  AORTA Ao Root diam: 3.90 cm Ao Asc diam:  4.00 cm MITRAL VALVE                TRICUSPID VALVE MV Area (PHT): 5.66 cm     TR Peak grad:   19.4 mmHg MV Decel Time: 134 msec     TR Vmax:        220.00 cm/s MR Peak grad: 71.6 mmHg MR Mean grad: 38.0 mmHg     SHUNTS MR Vmax:      423.00 cm/s   Systemic VTI: 0.09 m MR Vmean:     271.0 cm/s MV E velocity: 102.00 cm/s Daniel Bensimhon MD Electronically signed by Daniel Bensimhon MD Signature Date/Time: 02/15/2021/5:10:21 PM    Final    VAS US LOWER EXTREMITY ARTERIAL DUPLEX  Result Date: 02/15/2021 LOWER EXTREMITY ARTERIAL DUPLEX STUDY Patient Name:  Lenin L Floren   Date of Exam:     02/15/2021 Medical Rec #: [id number]      Accession #:    [id number] Date of Birth: 04/04/1961      Patient Gender: M Patient Age:   59Y Exam Location:  [facility] Procedure:      VAS US LOWER EXTREMITY ARTERIAL DUPLEX Referring Phys: [id number] VANCE W BRABHAM --------------------------------------------------------------------------------  Indications: Gangrene, peripheral artery disease, and Follow up CTA findings-              concern for mid SFA occlusive disease. High Risk Factors: Hypertension, hyperlipidemia, Diabetes, coronary artery                    disease, prior CVA.  Current ABI: RT 1.04/0.00 LT 1.02/0.00 Comparison Study: 02-15-2021 Most recent ABI w/ TBI study, 04-15-2018 prior ABI                   was RT 1.23 LT 1.15 Performing Technologist: Rachel Hodge RDMS,RVT  Examination Guidelines: A complete evaluation includes B-mode imaging, spectral Doppler, color Doppler, and power Doppler as needed of all accessible portions of each vessel. Bilateral testing is considered an integral part of a complete examination. Limited examinations for reoccurring indications may be performed as noted.  +-----------+--------+-----+--------+---------+--------+ RIGHT      PSV cm/sRatioStenosisWaveform Comments +-----------+--------+-----+--------+---------+--------+ CFA Prox   87                   triphasic         +-----------+--------+-----+--------+---------+--------+ CFA Mid    70                   triphasic         +-----------+--------+-----+--------+---------+--------+ CFA Distal 68                   triphasic         +-----------+--------+-----+--------+---------+--------+ DFA        60                   biphasic          +-----------+--------+-----+--------+---------+--------+ SFA Prox   78                   triphasic         +-----------+--------+-----+--------+---------+--------+ SFA Mid    70                   triphasic          +-----------+--------+-----+--------+---------+--------+ SFA Distal 70                   triphasic         +-----------+--------+-----+--------+---------+--------+ POP Prox   71                   triphasic         +-----------+--------+-----+--------+---------+--------+ POP Mid    53                   triphasic         +-----------+--------+-----+--------+---------+--------+ POP Distal 54                   triphasic         +-----------+--------+-----+--------+---------+--------+ TP Trunk   51                   triphasic         +-----------+--------+-----+--------+---------+--------+ ATA Distal 36                     biphasic          +-----------+--------+-----+--------+---------+--------+ PTA Distal 45                   biphasic          +-----------+--------+-----+--------+---------+--------+ PERO Distal35                   biphasic          +-----------+--------+-----+--------+---------+--------+ DP         34                   biphasic          +-----------+--------+-----+--------+---------+--------+  Summary: Right: No evidence of stenosis or occlusive disease.  See table(s) above for measurements and observations. Electronically signed by Thomas Hawken on 02/15/2021 at 5:19:51 PM.    Final    VAS US LOWER EXTREMITY VENOUS (DVT)  Result Date: 02/15/2021  Lower Venous DVT Study Patient Name:  Randol L Boomer  Date of Exam:   02/15/2021 Medical Rec #: [id number]      Accession #:    [id number] Date of Birth: 02/01/1961      Patient Gender: M Patient Age:   59Y Exam Location:  [facility] Procedure:      VAS US LOWER EXTREMITY VENOUS (DVT) Referring Phys: [id number] VANCE W BRABHAM --------------------------------------------------------------------------------  Indications: Erythema and edema,.  Anticoagulation: Heparin. Limitations: Poor ultrasound/tissue interface. Comparison Study: No prior studies. Performing Technologist: Rachel Hodge RDMS,RVT   Examination Guidelines: A complete evaluation includes B-mode imaging, spectral Doppler, color Doppler, and power Doppler as needed of all accessible portions of each vessel. Bilateral testing is considered an integral part of a complete examination. Limited examinations for reoccurring indications may be performed as noted. The reflux portion of the exam is performed with the patient in reverse Trendelenburg.  +-----+---------------+---------+-----------+----------+--------------+ RIGHTCompressibilityPhasicitySpontaneityPropertiesThrombus Aging +-----+---------------+---------+-----------+----------+--------------+ CFV  Full           Yes      Yes                                 +-----+---------------+---------+-----------+----------+--------------+   +---------+---------------+---------+-----------+----------+-------------------+ LEFT     CompressibilityPhasicitySpontaneityPropertiesThrombus Aging      +---------+---------------+---------+-----------+----------+-------------------+ CFV      Full           Yes      Yes                                      +---------+---------------+---------+-----------+----------+-------------------+ SFJ      Full                                                             +---------+---------------+---------+-----------+----------+-------------------+ FV Prox  Full                                                             +---------+---------------+---------+-----------+----------+-------------------+ FV Mid   Full                                                             +---------+---------------+---------+-----------+----------+-------------------+   FV DistalFull                                                             +---------+---------------+---------+-----------+----------+-------------------+ PFV      Full                                                              +---------+---------------+---------+-----------+----------+-------------------+ POP      Full           Yes      Yes                                      +---------+---------------+---------+-----------+----------+-------------------+ PTV      Full                                                             +---------+---------------+---------+-----------+----------+-------------------+ PERO                    Yes      Yes                  Not well visualized +---------+---------------+---------+-----------+----------+-------------------+    Summary: RIGHT: - No evidence of common femoral vein obstruction.  LEFT: - There is no evidence of deep vein thrombosis in the lower extremity. However, portions of this examination were limited- see technologist comments above.  - No cystic structure found in the popliteal fossa.  *See table(s) above for measurements and observations. Electronically signed by Thomas Hawken on 02/15/2021 at 5:20:04 PM.    Final    - pertinent xrays, CT, MRI studies were reviewed and independently interpreted  Positive ROS: All other systems have been reviewed and were otherwise negative with the exception of those mentioned in the HPI and as above.  Physical Exam: General: Alert, no acute distress Psychiatric: Patient is competent for consent with normal mood and affect Lymphatic: No axillary or cervical lymphadenopathy Cardiovascular: No pedal edema Respiratory: No cyanosis, no use of accessory musculature GI: No organomegaly, abdomen is soft and non-tender    Images:  @ENCIMAGES@  Labs:  Lab Results  Component Value Date   HGBA1C 7.8 (H) 02/16/2021   HGBA1C (H) 01/15/2010    7.2 (NOTE) The ADA recommends the following therapeutic goal for glycemic control related to Hgb A1c measurement: Goal of therapy: <6.5 Hgb A1c  Reference: American Diabetes Association: Clinical Practice Recommendations 2010, Diabetes Care, 2010, 33: (Suppl  1).    Lab  Results  Component Value Date   ALBUMIN 2.6 (L) 02/14/2021   ALBUMIN 4.4 04/01/2007     CBC EXTENDED Latest Ref Rng & Units 02/16/2021 02/15/2021 02/14/2021  WBC 4.0 - 10.5 K/uL 11.4(H) 16.1(H) 15.3(H)  RBC 4.22 - 5.81 MIL/uL 4.49 4.55 4.47  HGB 13.0 - 17.0 g/dL 12.7(L) 12.6(L) 12.5(L)  HCT 39.0 - 52.0 %   38.7(L) 38.5(L) 37.5(L)  PLT 150 - 400 K/uL 222 226 216    Neurologic: Patient does not have protective sensation bilateral lower extremities.   MUSCULOSKELETAL:   Skin: Examination there is necrotic skin ulceration and purulent drainage from the great toe second toe and third toe.  Patient has a palpable dorsalis pedis pulse ankle-brachial indices are normal with biphasic flow by Doppler.  Radiograph shows chronic destructive bony changes of the IP joint great toe consistent with chronic gout.  Hemoglobin A1c 7.8.  White blood cell count 16.1 hemoglobin 12.6.  Albumin 2.6.  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  Plan: We will plan for a left transmetatarsal amputation this week.  Thank you for the consult and the opportunity to see Mr. Madeira  Dana Debo, MD [facility] [phone] 9:01 AM     

## 2021-02-16 NOTE — Plan of Care (Signed)

## 2021-02-16 NOTE — Progress Notes (Signed)
ANTICOAGULATION CONSULT NOTE   Pharmacy Consult for Heparin Indication: atrial fibrillation with possible LE embolus  Allergies  Allergen Reactions  . Codeine Itching  . Bee Venom Other (See Comments)    "Feels like his head is on fire when he goes outside in the sun"  . Hydrocodone-Acetaminophen Other (See Comments)  . Other Other (See Comments)    "Feels like his head is on fire when he goes outside in the sun"  . Cephalexin   . Vicodin [Hydrocodone-Acetaminophen] Itching  . Zocor [Simvastatin] Other (See Comments)    Myalgias    Patient Measurements: Weight: 99.8 kg (220 lb 0.3 oz)  Vital Signs: Temp: 98.1 F (36.7 C) (04/23 0650) Temp Source: Core (04/23 0650) BP: 98/83 (04/23 0700) Pulse Rate: 72 (04/23 0500)  Labs: Recent Labs    02/14/21 2127 02/14/21 2127 02/15/21 0250 02/15/21 0531 02/15/21 1322 02/15/21 2122 02/16/21 0622  HGB 12.5*  --  12.6*  --   --   --  12.7*  HCT 37.5*  --  38.5*  --   --   --  38.7*  PLT 216  --  226  --   --   --  222  APTT 46*  --  43*  --   --   --   --   LABPROT 18.1*  --   --   --   --   --   --   INR 1.5*  --   --   --   --   --   --   HEPARINUNFRC  --    < > 0.21*  --  0.25* 0.24* 0.43  CREATININE 0.99  --  1.01 1.03  --   --   --    < > = values in this interval not displayed.    Estimated Creatinine Clearance: 92.3 mL/min (by C-G formula based on SCr of 1.03 mg/dL).   Medical History: Past Medical History:  Diagnosis Date  . Acute combined systolic and diastolic CHF, NYHA class 1 (HCC) 08/07/2016  . Anemia   . Atrial fibrillation (HCC)   . BACK PAIN 02/11/2010   Qualifier: Diagnosis of  By: Manson Passey, RN, BSN, Lauren    . CAD (coronary artery disease) 02/11/2010   Qualifier: Diagnosis of  By: Manson Passey, RN, BSN, Lauren    . Cellulitis 09/29/2017  . Cerebrovascular disease   . CHEST PAIN 02/11/2010   Qualifier: Diagnosis of  By: Manson Passey, RN, BSN, Lauren    . Chronic anticoagulation 03/25/2018  . Chronic pain syndrome   .  Chronic systolic congestive heart failure (HCC) 03/25/2018  . Community acquired pneumonia 08/11/2016  . DEPRESSION/ANXIETY 02/11/2010   Qualifier: Diagnosis of  By: Manson Passey, RN, BSN, Lauren    . Diabetes mellitus, type 2 (HCC) 02/11/2010   Qualifier: Diagnosis of  By: Manson Passey, RN, BSN, Lauren    . Diabetic ulcer of toe of right foot associated with type 2 diabetes mellitus (HCC) 10/01/2017  . ESOPHAGEAL STRICTURE 02/11/2010   Qualifier: Diagnosis of  By: Manson Passey, RN, BSN, Lauren    . GERD (gastroesophageal reflux disease)   . HEADACHE 02/11/2010   Qualifier: Diagnosis of  By: Manson Passey, RN, BSN, Lauren    . History of CVA (cerebrovascular accident) 09/29/2017  . History of drug abuse (HCC) 08/05/2016  . Hypercholesteremia   . Hyperlipemia 02/11/2010   Qualifier: Diagnosis of  By: Manson Passey, RN, BSN, Lauren    . Hyperlipidemia   . Hypertension 02/11/2010   Qualifier: Diagnosis of  ByManson Passey, RN, BSN, Lauren    . HYPERTENSION, UNSPECIFIED 02/11/2010   Qualifier: Diagnosis of  By: Manson Passey, RN, BSN, Lauren    . Hypogonadism male   . 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 Status post thrombectomy  . Osteoarthritis 08/05/2016  . Osteomyelitis, unspecified (HCC) 10/06/2017  . PAF (paroxysmal atrial fibrillation) (HCC) 08/05/2016  . Polysubstance abuse (HCC) 09/29/2017  . Rotator cuff syndrome    Shoulder  . Shoulder pain   . 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 Sleep apnea   . Stroke (HCC) 08/05/2016  . Tobacco use 08/11/2016    Assessment: 60 y.o. gentleman with medical history of DM, HTN, strokes, and A. Fib on rate control and systemic AC who presents as a transfer from [facility] ED for evaluation of limb ischemic and septic shock.  Per medical record had been on Xarelto, unclear when patient had his last dose. Patient started on Heparin infusion at 1400 units/hr at [place]. Pharmacy  consulted to follow heparin drip.   Heparin level therapeutic at 0.43 on drip rate 1900 units/hr. CBC stable this morning. No overt bleeding or infusion issues noted.   Goal of Therapy:  Heparin level 0.3-0.7 units/ml  Monitor platelets by anticoagulation protocol: Yes   Plan:  Continue IV heparin at 1900 units/hr Monitor daily HL, CBC, s/sx bleeding  Tama Headings, PharmD, BCPS PGY2 Cardiology Pharmacy Resident Phone: [phone] 02/16/2021  8:36 AM  Please check [email or web address] for unit-specific pharmacy phone numbers.

## 2021-02-16 NOTE — Progress Notes (Addendum)
eLink Physician-Brief Progress Note Patient Name: Fernando Maddox DOB: 1961-03-17 MRN: [id number]   Date of Service  02/16/2021  HPI/Events of Note  Bedside RN requesting a sleep aid / anxiolytic, however patient is currently asleep.  eICU Interventions  No indication for anxiolytic / sleep aid at this time.        Thomasene Lot Jshaun Abernathy 02/16/2021, 9:11 PM

## 2021-02-16 NOTE — Progress Notes (Addendum)
NAME:  Fernando Maddox, MRN:  [id number], DOB:  1961/10/23, LOS: 2 ADMISSION DATE:  02/14/2021, CONSULTATION DATE:  02/16/2021 REFERRING MD:  [facility], CHIEF COMPLAINT:  Leg pain   History of Present Illness:  Fernando Maddox is a 60 y.o. gentleman with medical history of DM, HTN, strokes, and A. Fib on rate control and systemic AC who presents as a transfer from [facility] ED for evaluation of limb ischemic and septic shock.   Pertinent  Medical History  CAD A. Fib on AC DM HTN Strokes Polysubstance abuse  Significant Hospital Events: Including procedures, antibiotic start and stop dates in addition to other pertinent events   . 4/21 Admitted to [facility] from [facility] ED, found down with gangrenous appearance of the right lower extremity, with erythema and edema of the left lower extremity. CT angio was performed which showed concern for acute right arterial occlusion. . 4/21 central line placed, started on heparin drip . 4/22 stable day, foot looked a bit worse, just monitor per [facility]  Interim History / Subjective:  No events. C/o feeling cold Good diuresis   Objective   Blood pressure 98/83, pulse 72, temperature 98.1 F (36.7 C), temperature source Core, resp. rate 15, weight 99.8 kg, SpO2 97 %. CVP:  [18 mmHg-23 mmHg] 20 mmHg  FiO2 (%):  [40 %] 40 %   Intake/Output Summary (Last 24 hours) at 02/16/2021 2505 Last data filed at 02/16/2021 0600 Gross per 24 hour  Intake 1461.94 ml  Output 3855 ml  Net -2393.06 ml   Filed Weights   02/14/21 2221 02/15/21 0500 02/16/21 0500  Weight: 90.7 kg 96.4 kg 99.8 kg   Examination: Constitutional: chronically ill appearing man sleepy in bed  Eyes: EOMI, pupils equal Ears, nose, mouth, and throat: MMM, normocephalic Cardiovascular: RRR, no murmurs Respiratory: diminished bases, irregular breathing pattern when falls asleep Gastrointestinal: soft, hypoactive BS Skin: No rashes, normal turgor Neurologic: moves all 4 ext to command  with enough prompting Psychiatric: RASS -1 Ext: LLE wrapped with purulent strikethrough, RLE dusky but still doppler-able pulses   Labs/imaging that I havepersonally reviewed    4/21 Lab work from [place] ED today showed sodium 125, creatinine over 3,  hypokalemia, leukocytosis, elevated pct  4/21 CT angio performed at [facility] shows right superficial femoral artery arterial occlusion  Echocardiogram 2019 shows: Moderate/Severe tricuspid regurgitation with an eccentric jet.  RVSP 50 mm Hg.  Moderate pulmonary hypertension.  Severely dilated left atrium.  Ejection fraction is visually estimated at 30-35%  Mild-to-moderately dilated left ventricle.  Mildly dilated right ventricle.   Resolved Hospital Problem list     Assessment & Plan:  Volume overloaded state of heart Septic shock resolved Severe sleep apnea Threatened ischemia RLE Hx Afib supposed to be on AC Severe diabetic LLE infection- potentially nonviable or non-debride-able  Metabolic encephalopathy ongoing stable DM without hyperglycemia - f/u BMP, replete Mg, redose diuretics - f/u Dr. Audrie Lia recommendations for LLE - RLE management per vascular - CPAP qHS - Heparin gtt - Broad spectrum abx, narrow pending cultures if able, may need longer course until source control obtained  Best practice   Diet:  Oral Pain/Anxiety/Delirium protocol (if indicated): No VAP protocol (if indicated): Not indicated DVT prophylaxis: Systemic AC GI prophylaxis: N/A Glucose control:  SSI Yes Central venous access:  Yes, and it is still needed Arterial line:  N/A Foley:  N/A Mobility:  bed rest  PT consulted: N/A Last date of multidisciplinary goals of care discussion []  Code  Status:  full code Disposition: Okay for PCU, appreciate [facility] taking over care 4/24  Myrla Halsted MD PCCM

## 2021-02-16 NOTE — Progress Notes (Signed)
Labs reviewed. Will give another 40mg  lasix

## 2021-02-16 NOTE — Progress Notes (Signed)
[facility] [place] PROGRESS NOTE  ASSESSMENT / PLAN: Fernando Maddox is a 60 y.o. male found down x 3 days with left forefoot ulceration and gangrene. Concern for RLE arterial occlusion initially, unremarkable non-invasive vascular studies yesterday. Orthopedics consulted and planning left TMA next week. Will follow along with you.  SUBJECTIVE: Sleeping comfortably  OBJECTIVE: BP 106/81   Pulse 91   Temp 98.1 F (36.7 C) (Core)   Resp (!) 24   Wt 99.8 kg   SpO2 100%   BMI 27.50 kg/m   Intake/Output Summary (Last 24 hours) at 02/16/2021 1039 Last data filed at 02/16/2021 1000 Gross per 24 hour  Intake 1644.43 ml  Output 3810 ml  Net -2165.57 ml    No distress RRR Unlabored Unchanged appearance of BLE  CBC Latest Ref Rng & Units 02/16/2021 02/15/2021 02/14/2021  WBC 4.0 - 10.5 K/uL 11.4(H) 16.1(H) 15.3(H)  Hemoglobin 13.0 - 17.0 g/dL 12.7(L) 12.6(L) 12.5(L)  Hematocrit 39.0 - 52.0 % 38.7(L) 38.5(L) 37.5(L)  Platelets 150 - 400 K/uL 222 226 216     CMP Latest Ref Rng & Units 02/16/2021 02/15/2021 02/15/2021  Glucose 70 - 99 mg/dL 82 810(F) 751(W)  BUN 6 - 20 mg/dL 18 11 12   Creatinine 0.61 - 1.24 mg/dL 2.58 5.27  Sodium 135 - 145 mmol/L 123(L) 126(L) 124(L)  Potassium 3.5 - 5.1 mmol/L 3.9 3.7 5.4(H)  Chloride 98 - 111 mmol/L 91(L) 91(L) 94(L)  CO2 22 - 32 mmol/L 20(L) 20(L) 22  Calcium 8.9 - 10.3 mg/dL 7.9(L) 7.5(L) 7.7(L)  Total Protein 6.5 - 8.1 g/dL - - -  Total Bilirubin 0.3 - 1.2 mg/dL - - -  Alkaline Phos 38 - 126 U/L - - -  AST 15 - 41 U/L - - -  ALT 0 - 44 U/L - - -    Estimated Creatinine Clearance: 76.7 mL/min (by C-G formula based on SCr of 1.24 mg/dL).  7.82. Rande Brunt, MD [facility] Phone Number: [phone] 02/16/2021 10:39 AM

## 2021-02-16 NOTE — Consult Note (Signed)
ORTHOPAEDIC CONSULTATION  REQUESTING PHYSICIAN: Lorin Glass, MD  Chief Complaint: Ulceration purulent drainage left forefoot.  HPI: Fernando Maddox is a 60 y.o. male who presents with necrotic ulceration and purulent drainage from the left forefoot involving the great toe second toe and third toe.  Patient is unsure when this started.  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  Past Medical History:  Diagnosis Date  . Acute combined systolic and diastolic CHF, NYHA class 1 (HCC) 08/07/2016  . Anemia   . Atrial fibrillation (HCC)   . BACK PAIN 02/11/2010   Qualifier: Diagnosis of  By: Manson Passey, RN, BSN, Lauren    . CAD (coronary artery disease) 02/11/2010   Qualifier: Diagnosis of  By: Manson Passey, RN, BSN, Lauren    . Cellulitis 09/29/2017  . Cerebrovascular disease   . CHEST PAIN 02/11/2010   Qualifier: Diagnosis of  By: Manson Passey, RN, BSN, Lauren    . Chronic anticoagulation 03/25/2018  . Chronic pain syndrome   . Chronic systolic congestive heart failure (HCC) 03/25/2018  . Community acquired pneumonia 08/11/2016  . DEPRESSION/ANXIETY 02/11/2010   Qualifier: Diagnosis of  By: Manson Passey, RN, BSN, Lauren    . Diabetes mellitus, type 2 (HCC) 02/11/2010   Qualifier: Diagnosis of  By: Manson Passey, RN, BSN, Lauren    . Diabetic ulcer of toe of right foot associated with type 2 diabetes mellitus (HCC) 10/01/2017  . ESOPHAGEAL STRICTURE 02/11/2010   Qualifier: Diagnosis of  By: Manson Passey, RN, BSN, Lauren    . GERD (gastroesophageal reflux disease)   . HEADACHE 02/11/2010   Qualifier: Diagnosis of  By: Manson Passey, RN, BSN, Lauren    . History of CVA (cerebrovascular accident) 09/29/2017  . History of drug abuse (HCC) 08/05/2016  . Hypercholesteremia   . Hyperlipemia 02/11/2010   Qualifier: Diagnosis of  By: Manson Passey, RN, BSN, Lauren    . Hyperlipidemia   . Hypertension 02/11/2010   Qualifier: Diagnosis of  By: Manson Passey, RN, BSN, Lauren    . HYPERTENSION, UNSPECIFIED 02/11/2010   Qualifier:  Diagnosis of  By: Manson Passey, RN, BSN, Lauren    . Hypogonadism male   . 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 Status post thrombectomy  . Osteoarthritis 08/05/2016  . Osteomyelitis, unspecified (HCC) 10/06/2017  . PAF (paroxysmal atrial fibrillation) (HCC) 08/05/2016  . Polysubstance abuse (HCC) 09/29/2017  . Rotator cuff syndrome    Shoulder  . Shoulder pain   . 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 Sleep apnea   . Stroke (HCC) 08/05/2016  . Tobacco use 08/11/2016   Past Surgical History:  Procedure Laterality Date  . NO PAST SURGERIES     Social History   Socioeconomic History  . Marital status: Married    Spouse name: Not on file  . Number of children: Not on file  . Years of education: Not on file  . Highest education level: Not on file  Occupational History  . Not on file  Tobacco Use  . Smoking status: Former Smoker    Types: Cigarettes    Start date: 11/06/2016  . Smokeless tobacco: Former Neurosurgeon    Quit date: 03/06/2016  Substance and Sexual Activity  . Alcohol use: Not on file  . Drug use: Not on file  . Sexual activity: Not on file  Other Topics Concern  . Not on file  Social History Narrative  . Not  on file   Social Determinants of Health   Financial Resource Strain: Not on file  Food Insecurity: Not on file  Transportation Needs: Not on file  Physical Activity: Not on file  Stress: Not on file  Social Connections: Not on file   Family History  Problem Relation Age of Onset  . Hypertension Mother   . Hypertension Father    - negative except otherwise stated in the family history section Allergies  Allergen Reactions  . Codeine Itching  . Bee Venom Other (See Comments)    "Feels like his head is on fire when he goes outside in the sun"  . Hydrocodone-Acetaminophen Other (See Comments)  . Other Other (See Comments)    "Feels like his head  is on fire when he goes outside in the sun"  . Cephalexin   . Vicodin [Hydrocodone-Acetaminophen] Itching  . Zocor [Simvastatin] Other (See Comments)    Myalgias   Prior to Admission medications   Medication Sig Start Date End Date Taking? Authorizing Provider  folic acid (FOLVITE) 1 MG tablet Take 1 mg by mouth daily.   Yes [provider]  losartan (COZAAR) 25 MG tablet Take 25 mg by mouth in the morning and at bedtime.   Yes [provider]  metFORMIN (GLUCOPHAGE) 500 MG tablet Take 500 mg by mouth 2 (two) times daily. 02/04/21  Yes [provider]  metoprolol succinate (TOPROL-XL) 25 MG 24 hr tablet Take 25 mg by mouth daily.  10/22/16  Yes [provider]  rivaroxaban (XARELTO) 20 MG TABS tablet Take 20 mg by mouth daily with supper.   Yes [provider]  spironolactone (ALDACTONE) 50 MG tablet Take 50 mg by mouth daily.   Yes [provider]  furosemide (LASIX) 40 MG tablet Take 40 mg by mouth.    [provider]  glipiZIDE (GLUCOTROL XL) 2.5 MG 24 hr tablet Take 2.5 mg by mouth daily. 05/22/20   [provider]  lisinopril (PRINIVIL,ZESTRIL) 10 MG tablet Take 10 mg by mouth daily. 08/12/16 08/06/20  [provider]  pantoprazole (PROTONIX) 40 MG tablet Take 40 mg by mouth daily.  10/14/16   [provider]  potassium chloride (KLOR-CON) 10 MEQ tablet Take 10 mEq by mouth daily.    [provider]  pravastatin (PRAVACHOL) 40 MG tablet Take 40 mg by mouth daily.    [provider]   DG Chest 1 View  Result Date: 02/15/2021 CLINICAL DATA:  Respiratory failure. EXAM: CHEST  1 VIEW COMPARISON:  February 14, 2021. FINDINGS: Stable cardiomegaly. No pneumothorax or pleural effusion is noted. Stable position of right internal jugular catheter line. No acute pulmonary disease is noted. Old bilateral rib fractures are noted. IMPRESSION: No active disease. Electronically Signed   By: Lupita Raider M.D.   On: 02/15/2021 15:47   DG Foot 2 Views Left  Result Date: 02/15/2021 CLINICAL DATA:  Foot ulcer. EXAM: LEFT FOOT - 2 VIEW COMPARISON:  None. FINDINGS: There is no evidence of fracture or dislocation. Severe irregularity is seen involving the first interphalangeal joint with lytic destruction of the adjacent articular surfaces of the proximal distal phalanges. This is concerning for possible septic arthritis with adjacent osteomyelitis. Soft tissue swelling of the first toe is noted suggesting cellulitis. Degenerative changes are also seen involving the distal interphalangeal joints of the second and third toes. IMPRESSION: Findings concerning for possible septic arthritis of the first interphalangeal joint with possible osteomyelitis of the adjacent proximal distal  phalanges. MRI may be performed further evaluation. Electronically Signed   By: Lupita Raider M.D.   On: 02/15/2021 15:50   VAS [place] ABI WITH/WO TBI  Result Date: 02/15/2021  LOWER EXTREMITY DOPPLER STUDY Patient Name:  AYRON FLORANCE  Date of Exam:   02/15/2021 Medical Rec #: [id number]      Accession #:    [id number] Date of Birth: 1961/04/09      Patient Gender: M Patient Age:   59Y Exam Location:  [facility] Procedure:      VAS [place] ABI WITH/WO TBI Referring Phys: [id number] Nada Libman --------------------------------------------------------------------------------  High Risk Factors: Hypertension, hyperlipidemia, Diabetes, current smoker,                    coronary artery disease, prior CVA. Other Factors: CHF, Afib, Polysubstance abuse.  Comparison Study: Previous exam 04/15/18 -WNL Performing Technologist: Ernestene Mention RVT, RDMS  Examination Guidelines: A complete evaluation includes at minimum, Doppler waveform signals and systolic blood pressure reading at the level of bilateral brachial, anterior tibial, and posterior tibial arteries, when vessel segments are accessible. Bilateral testing is considered an integral part of a  complete examination. Photoelectric Plethysmograph (PPG) waveforms and toe systolic pressure readings are included as required and additional duplex testing as needed. Limited examinations for reoccurring indications may be performed as noted.  ABI Findings: +--------+------------------+-----+----------------+--------+ Right   Rt Pressure (mmHg)IndexWaveform        Comment  +--------+------------------+-----+----------------+--------+ [id number]                    biphasic                 +--------+------------------+-----+----------------+--------+ PTA     117               1.04 audibly biphasic         +--------+------------------+-----+----------------+--------+ DP      111               0.98 audibly biphasic         +--------+------------------+-----+----------------+--------+ +--------+------------------+-----+----------------+-------+ Left    Lt Pressure (mmHg)IndexWaveform        Comment +--------+------------------+-----+----------------+-------+ [id number]                    triphasic               +--------+------------------+-----+----------------+-------+ PTA     106               0.94 audibly biphasic        +--------+------------------+-----+----------------+-------+ DP      115               1.02 audibly biphasic        +--------+------------------+-----+----------------+-------+ +-------+-----------+-----------+------------+------------+ ABI/TBIToday's ABIToday's TBIPrevious ABIPrevious TBI +-------+-----------+-----------+------------+------------+ Right  1.04       0          1.23                     +-------+-----------+-----------+------------+------------+ Left   1.02       0          1.15                     +-------+-----------+-----------+------------+------------+ Doppler readings are unreliable due to patient constantly moving and Afib.  Summary: Right: Resting right ankle-brachial index is within normal range. No evidence  of significant right lower extremity arterial disease. The right toe-brachial index is  absent. Left: Resting left ankle-brachial index is within normal range. No evidence of significant left lower extremity arterial disease. The left toe-brachial index is absent.  *See table(s) above for measurements and observations.  Electronically signed by Heath Larkhomas Hawken on 02/15/2021 at 5:25:40 PM.    Final    ECHOCARDIOGRAM COMPLETE  Result Date: 02/15/2021    ECHOCARDIOGRAM REPORT   Patient Name:   Nani SkillernGARY L Macht Date of Exam: 02/15/2021 Medical Rec #:  [id number]     Height:       75.0 in Accession #:    [id number]    Weight:       212.5 lb Date of Birth:  01/21/1961     BSA:          2.252 m Patient Age:    59 years      BP:           100/72 mmHg Patient Gender: M             HR:           103 bpm. Exam Location:  Inpatient Procedure: 2D Echo, Color Doppler, Cardiac Doppler and Intracardiac            Opacification Agent Indications:    Stroke  History:        Patient has no prior history of Echocardiogram examinations.                 CHF, Abnormal ECG, Stroke, Arrythmias:Atrial Fibrillation,                 Signs/Symptoms:Chest Pain, Shortness of Breath and Dyspnea; Risk                 Factors:Current Smoker, Diabetes, Hypertension and Dyslipidemia.                 Septic shock.  Sonographer:    Sheralyn Boatmanina West RDCS Referring Phys: [id number] Olene CravenIKITA S DESAI  Sonographer Comments: Technically difficult study due to poor echo windows and echo performed with patient supine and on artificial respirator. IMPRESSIONS  1. Left ventricular ejection fraction, by estimation, is 20 to 25%. The left ventricle has severely decreased function. The left ventricle demonstrates global hypokinesis. The left ventricular internal cavity size was moderately dilated. Left ventricular diastolic function could not be evaluated. There is the interventricular septum is flattened in systole and diastole, consistent with right ventricular pressure and volume  overload.  2. Right ventricular systolic function is severely reduced. The right ventricular size is moderately enlarged.  3. Left atrial size was moderately dilated.  4. Right atrial size was moderately dilated.  5. The mitral valve is normal in structure. Mild to moderate mitral valve regurgitation.  6. Tricuspid valve regurgitation is mild to moderate.  7. The aortic valve is tricuspid. There is mild calcification of the aortic valve. Aortic valve regurgitation is not visualized.  8. Aortic dilatation noted. There is borderline dilatation of the ascending aorta, measuring 40 mm.  9. The inferior vena cava is dilated in size with <50% respiratory variability, suggesting right atrial pressure of 15 mmHg. FINDINGS  Left Ventricle: Left ventricular ejection fraction, by estimation, is 20 to 25%. The left ventricle has severely decreased function. The left ventricle demonstrates global hypokinesis. Definity contrast agent was given IV to delineate the left ventricular endocardial borders. The left ventricular internal cavity size was moderately dilated. There is no left ventricular hypertrophy. The interventricular septum is flattened in systole and diastole, consistent with right ventricular pressure  and volume overload. Left ventricular diastolic function could not be evaluated due to atrial fibrillation. Left ventricular diastolic function could not be evaluated. Right Ventricle: The right ventricular size is moderately enlarged. No increase in right ventricular wall thickness. Right ventricular systolic function is severely reduced. Left Atrium: Left atrial size was moderately dilated. Right Atrium: Right atrial size was moderately dilated. Pericardium: There is no evidence of pericardial effusion. Mitral Valve: The mitral valve is normal in structure. Mild to moderate mitral valve regurgitation. Tricuspid Valve: The tricuspid valve is normal in structure. Tricuspid valve regurgitation is mild to moderate. Aortic  Valve: The aortic valve is tricuspid. There is mild calcification of the aortic valve. Aortic valve regurgitation is not visualized. Pulmonic Valve: The pulmonic valve was grossly normal. Pulmonic valve regurgitation is not visualized. Aorta: Aortic dilatation noted. There is borderline dilatation of the ascending aorta, measuring 40 mm. Venous: The inferior vena cava is dilated in size with less than 50% respiratory variability, suggesting right atrial pressure of 15 mmHg. IAS/Shunts: No atrial level shunt detected by color flow Doppler.  LEFT VENTRICLE PLAX 2D LVIDd:         6.20 cm LVIDs:         5.50 cm LV PW:         1.60 cm LV IVS:        1.30 cm  LV Volumes (MOD) LV vol d, MOD A2C: 162.0 ml LV vol d, MOD A4C: 166.0 ml LV vol s, MOD A2C: 116.0 ml LV vol s, MOD A4C: 129.0 ml LV SV MOD A2C:     46.0 ml LV SV MOD A4C:     166.0 ml LV SV MOD BP:      42.7 ml RIGHT VENTRICLE            IVC RV S prime:     4.43 cm/s  IVC diam: 3.00 cm TAPSE (M-mode): 0.8 cm LEFT ATRIUM              Index       RIGHT ATRIUM           Index LA diam:        4.90 cm  2.18 cm/m  RA Area:     21.60 cm LA Vol (A2C):   103.0 ml 45.74 ml/m RA Volume:   63.70 ml  28.29 ml/m LA Vol (A4C):   101.0 ml 44.85 ml/m LA Biplane Vol: 105.0 ml 46.63 ml/m  AORTIC VALVE LVOT Vmax:   66.20 cm/s LVOT Vmean:  47.000 cm/s LVOT VTI:    0.092 m  AORTA Ao Root diam: 3.90 cm Ao Asc diam:  4.00 cm MITRAL VALVE                TRICUSPID VALVE MV Area (PHT): 5.66 cm     TR Peak grad:   19.4 mmHg MV Decel Time: 134 msec     TR Vmax:        220.00 cm/s MR Peak grad: 71.6 mmHg MR Mean grad: 38.0 mmHg     SHUNTS MR Vmax:      423.00 cm/s   Systemic VTI: 0.09 m MR Vmean:     271.0 cm/s MV E velocity: 102.00 cm/s Arvilla Meres MD Electronically signed by Arvilla Meres MD Signature Date/Time: 02/15/2021/5:10:21 PM    Final    VAS [place] LOWER EXTREMITY ARTERIAL DUPLEX  Result Date: 02/15/2021 LOWER EXTREMITY ARTERIAL DUPLEX STUDY Patient Name:  SANTONIO SPEAKMAN   Date of Exam:  02/15/2021 Medical Rec #: [id number]      Accession #:    [id number] Date of Birth: 10-16-1961      Patient Gender: M Patient Age:   64Y Exam Location:  [facility] Procedure:      VAS [place] LOWER EXTREMITY ARTERIAL DUPLEX Referring Phys: [id number] Nada Libman --------------------------------------------------------------------------------  Indications: Gangrene, peripheral artery disease, and Follow up CTA findings-              concern for mid SFA occlusive disease. High Risk Factors: Hypertension, hyperlipidemia, Diabetes, coronary artery                    disease, prior CVA.  Current ABI: RT 1.04/0.00 LT 1.02/0.00 Comparison Study: 02-15-2021 Most recent ABI w/ TBI study, 04-15-2018 prior ABI                   was RT 1.23 LT 1.15 Performing Technologist: Jean Rosenthal RDMS,RVT  Examination Guidelines: A complete evaluation includes B-mode imaging, spectral Doppler, color Doppler, and power Doppler as needed of all accessible portions of each vessel. Bilateral testing is considered an integral part of a complete examination. Limited examinations for reoccurring indications may be performed as noted.  +-----------+--------+-----+--------+---------+--------+ RIGHT      PSV cm/sRatioStenosisWaveform Comments +-----------+--------+-----+--------+---------+--------+ CFA Prox   87                   triphasic         +-----------+--------+-----+--------+---------+--------+ CFA Mid    70                   triphasic         +-----------+--------+-----+--------+---------+--------+ CFA Distal 68                   triphasic         +-----------+--------+-----+--------+---------+--------+ DFA        60                   biphasic          +-----------+--------+-----+--------+---------+--------+ SFA Prox   78                   triphasic         +-----------+--------+-----+--------+---------+--------+ SFA Mid    70                   triphasic          +-----------+--------+-----+--------+---------+--------+ SFA Distal 70                   triphasic         +-----------+--------+-----+--------+---------+--------+ POP Prox   71                   triphasic         +-----------+--------+-----+--------+---------+--------+ POP Mid    53                   triphasic         +-----------+--------+-----+--------+---------+--------+ POP Distal 54                   triphasic         +-----------+--------+-----+--------+---------+--------+ TP Trunk   51                   triphasic         +-----------+--------+-----+--------+---------+--------+ ATA Distal 36  biphasic          +-----------+--------+-----+--------+---------+--------+ PTA Distal 45                   biphasic          +-----------+--------+-----+--------+---------+--------+ PERO Distal35                   biphasic          +-----------+--------+-----+--------+---------+--------+ DP         34                   biphasic          +-----------+--------+-----+--------+---------+--------+  Summary: Right: No evidence of stenosis or occlusive disease.  See table(s) above for measurements and observations. Electronically signed by Heath Lark on 02/15/2021 at 5:19:51 PM.    Final    VAS [place] LOWER EXTREMITY VENOUS (DVT)  Result Date: 02/15/2021  Lower Venous DVT Study Patient Name:  CHIJIOKE LASSER  Date of Exam:   02/15/2021 Medical Rec #: [id number]      Accession #:    [id number] Date of Birth: [birth year]      Patient Gender: M Patient Age:   59Y Exam [facility] Procedure:      VAS [place] LOWER EXTREMITY VENOUS (DVT) Referring Phys: [id number] Nada Libman --------------------------------------------------------------------------------  Indications: Erythema and edema,.  Anticoagulation: Heparin. Limitations: Poor ultrasound/tissue interface. Comparison Study: No prior studies. Performing Technologist: Jean Rosenthal RDMS,RVT   Examination Guidelines: A complete evaluation includes B-mode imaging, spectral Doppler, color Doppler, and power Doppler as needed of all accessible portions of each vessel. Bilateral testing is considered an integral part of a complete examination. Limited examinations for reoccurring indications may be performed as noted. The reflux portion of the exam is performed with the patient in reverse Trendelenburg.  +-----+---------------+---------+-----------+----------+--------------+ RIGHTCompressibilityPhasicitySpontaneityPropertiesThrombus Aging +-----+---------------+---------+-----------+----------+--------------+ CFV  Full           Yes      Yes                                 +-----+---------------+---------+-----------+----------+--------------+   +---------+---------------+---------+-----------+----------+-------------------+ LEFT     CompressibilityPhasicitySpontaneityPropertiesThrombus Aging      +---------+---------------+---------+-----------+----------+-------------------+ CFV      Full           Yes      Yes                                      +---------+---------------+---------+-----------+----------+-------------------+ SFJ      Full                                                             +---------+---------------+---------+-----------+----------+-------------------+ FV Prox  Full                                                             +---------+---------------+---------+-----------+----------+-------------------+ FV Mid   Full                                                             +---------+---------------+---------+-----------+----------+-------------------+  FV DistalFull                                                             +---------+---------------+---------+-----------+----------+-------------------+ PFV      Full                                                              +---------+---------------+---------+-----------+----------+-------------------+ POP      Full           Yes      Yes                                      +---------+---------------+---------+-----------+----------+-------------------+ PTV      Full                                                             +---------+---------------+---------+-----------+----------+-------------------+ PERO                    Yes      Yes                  Not well visualized +---------+---------------+---------+-----------+----------+-------------------+    Summary: RIGHT: - No evidence of common femoral vein obstruction.  LEFT: - There is no evidence of deep vein thrombosis in the lower extremity. However, portions of this examination were limited- see technologist comments above.  - No cystic structure found in the popliteal fossa.  *See table(s) above for measurements and observations. Electronically signed by Heath Lark on 02/15/2021 at 5:20:04 PM.    Final    - pertinent xrays, CT, MRI studies were reviewed and independently interpreted  Positive ROS: All other systems have been reviewed and were otherwise negative with the exception of those mentioned in the HPI and as above.  Physical Exam: General: Alert, no acute distress Psychiatric: Patient is competent for consent with normal mood and affect Lymphatic: No axillary or cervical lymphadenopathy Cardiovascular: No pedal edema Respiratory: No cyanosis, no use of accessory musculature GI: No organomegaly, abdomen is soft and non-tender    Images:  @ENCIMAGES @  Labs:  Lab Results  Component Value Date   HGBA1C 7.8 (H) 02/16/2021   HGBA1C (H) 01/15/2010    7.2 (NOTE) The ADA recommends the following therapeutic goal for glycemic control related to Hgb A1c measurement: Goal of therapy: <6.5 Hgb A1c  Reference: American Diabetes Association: Clinical Practice Recommendations 2010, Diabetes Care, 2010, 33: (Suppl  1).    Lab  Results  Component Value Date   ALBUMIN 2.6 (L) 02/14/2021   ALBUMIN 4.4 04/01/2007     CBC EXTENDED Latest Ref Rng & Units 02/16/2021 02/15/2021 02/14/2021  WBC 4.0 - 10.5 K/uL 11.4(H) 16.1(H) 15.3(H)  RBC 4.22 - 5.81 MIL/uL 4.49 4.55 4.47  HGB 13.0 - 17.0 g/dL 12.7(L) 12.6(L) 12.5(L)  HCT 39.0 - 52.0 %  38.7(L) 38.5(L) 37.5(L)  PLT 150 - 400 K/uL 222 226 216    Neurologic: Patient does not have protective sensation bilateral lower extremities.   MUSCULOSKELETAL:   Skin: Examination there is necrotic skin ulceration and purulent drainage from the great toe second toe and third toe.  Patient has a palpable dorsalis pedis pulse ankle-brachial indices are normal with biphasic flow by Doppler.  Radiograph shows chronic destructive bony changes of the IP joint great toe consistent with chronic gout.  Hemoglobin A1c 7.8.  White blood cell count 16.1 hemoglobin 12.6.  Albumin 2.6.  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  Plan: We will plan for a left transmetatarsal amputation this week.  Thank you for the consult and the opportunity to see Mr. Karry Causer, MD [facility] [phone] 9:01 AM

## 2021-02-17 DIAGNOSIS — R6521 Severe sepsis with septic shock: Secondary | ICD-10-CM | POA: Diagnosis not present

## 2021-02-17 DIAGNOSIS — A419 Sepsis, unspecified organism: Secondary | ICD-10-CM | POA: Diagnosis not present

## 2021-02-17 DIAGNOSIS — I4821 Permanent atrial fibrillation: Secondary | ICD-10-CM

## 2021-02-17 DIAGNOSIS — I5022 Chronic systolic (congestive) heart failure: Secondary | ICD-10-CM

## 2021-02-17 DIAGNOSIS — L97524 Non-pressure chronic ulcer of other part of left foot with necrosis of bone: Secondary | ICD-10-CM | POA: Diagnosis not present

## 2021-02-17 LAB — HEPARIN LEVEL (UNFRACTIONATED): Heparin Unfractionated: 0.36 IU/mL (ref 0.30–0.70)

## 2021-02-17 LAB — BASIC METABOLIC PANEL
Anion gap: 10 (ref 5–15)
Anion gap: 11 (ref 5–15)
BUN: 18 mg/dL (ref 6–20)
BUN: 19 mg/dL (ref 6–20)
CO2: 22 mmol/L (ref 22–32)
CO2: 23 mmol/L (ref 22–32)
Calcium: 8 mg/dL — ABNORMAL LOW (ref 8.9–10.3)
Calcium: 7.8 mg/dL — ABNORMAL LOW (ref 8.9–10.3)
Chloride: 90 mmol/L — ABNORMAL LOW (ref 98–111)
Chloride: 89 mmol/L — ABNORMAL LOW (ref 98–111)
Creatinine, Ser: 1.14 mg/dL (ref 0.61–1.24)
Creatinine, Ser: 1.05 mg/dL (ref 0.61–1.24)
GFR, Estimated: >60 mL/min (ref >60)
GFR, Estimated: >60 mL/min (ref >60)
Glucose, Bld: 110 mg/dL — ABNORMAL HIGH (ref 70–99)
Glucose, Bld: 157 mg/dL — ABNORMAL HIGH (ref 70–99)
Potassium: 3.7 mmol/L (ref 3.5–5.1)
Potassium: 3.2 mmol/L — ABNORMAL LOW (ref 3.5–5.1)
Sodium: 122 mmol/L — ABNORMAL LOW (ref 135–145)
Sodium: 123 mmol/L — ABNORMAL LOW (ref 135–145)

## 2021-02-17 LAB — CBC
HCT: 37.7 % — ABNORMAL LOW (ref 39.0–52.0)
Hemoglobin: 12.6 g/dL — ABNORMAL LOW (ref 13.0–17.0)
MCH: 28.1 pg (ref 26.0–34.0)
MCHC: 33.4 g/dL (ref 30.0–36.0)
MCV: 84 fL (ref 80.0–100.0)
Platelets: 210 10*3/uL (ref 150–400)
RBC: 4.49 MIL/uL (ref 4.22–5.81)
RDW: 16.4 % — ABNORMAL HIGH (ref 11.5–15.5)
WBC: 11.7 10*3/uL — ABNORMAL HIGH (ref 4.0–10.5)
nRBC: 0 % (ref 0.0–0.2)

## 2021-02-17 LAB — MAGNESIUM
Magnesium: 1.8 mg/dL (ref 1.7–2.4)
Magnesium: 1.5 mg/dL — ABNORMAL LOW (ref 1.7–2.4)

## 2021-02-17 LAB — GLUCOSE, CAPILLARY
Glucose-Capillary: 100 mg/dL — ABNORMAL HIGH (ref 70–99)
Glucose-Capillary: 111 mg/dL — ABNORMAL HIGH (ref 70–99)
Glucose-Capillary: 113 mg/dL — ABNORMAL HIGH (ref 70–99)
Glucose-Capillary: 125 mg/dL — ABNORMAL HIGH (ref 70–99)
Glucose-Capillary: 139 mg/dL — ABNORMAL HIGH (ref 70–99)
Glucose-Capillary: 163 mg/dL — ABNORMAL HIGH (ref 70–99)

## 2021-02-17 LAB — PHOSPHORUS: Phosphorus: 2.1 mg/dL — ABNORMAL LOW (ref 2.5–4.6)

## 2021-02-17 MED ORDER — K PHOS MONO-SOD PHOS DI & MONO 155-852-130 MG PO TABS
500.0000 mg | ORAL_TABLET | Freq: Three times a day (TID) | ORAL | Status: AC
  Administered 2021-02-17 (×3): 500 mg via ORAL
  Filled 2021-02-17 (×4): qty 2

## 2021-02-17 MED ORDER — FUROSEMIDE 10 MG/ML IJ SOLN
40.0000 mg | Freq: Once | INTRAMUSCULAR | Status: AC
  Administered 2021-02-17: 40 mg via INTRAVENOUS
  Filled 2021-02-17: qty 4

## 2021-02-17 MED ORDER — MAGNESIUM SULFATE 2 GM/50ML IV SOLN
2.0000 g | Freq: Once | INTRAVENOUS | Status: AC
  Administered 2021-02-17: 2 g via INTRAVENOUS
  Filled 2021-02-17: qty 50

## 2021-02-17 MED ORDER — POTASSIUM CHLORIDE CRYS ER 20 MEQ PO TBCR
40.0000 meq | EXTENDED_RELEASE_TABLET | Freq: Once | ORAL | Status: AC
  Administered 2021-02-17: 40 meq via ORAL
  Filled 2021-02-17: qty 2

## 2021-02-17 MED ORDER — ORAL CARE MOUTH RINSE
15.0000 mL | Freq: Two times a day (BID) | OROMUCOSAL | Status: DC
  Administered 2021-02-17 – 2021-03-08 (×26): 15 mL via OROMUCOSAL

## 2021-02-17 MED ORDER — ZOLPIDEM TARTRATE 5 MG PO TABS
5.0000 mg | ORAL_TABLET | Freq: Every evening | ORAL | Status: DC | PRN
  Administered 2021-02-17 – 2021-03-07 (×9): 5 mg via ORAL
  Filled 2021-02-17 (×10): qty 1

## 2021-02-17 NOTE — Progress Notes (Signed)
Pt was placed on BIPAP earlier in the evening, but was unable to tolerate. Bipap removed and pt placed back on [place].

## 2021-02-17 NOTE — Progress Notes (Signed)
ANTICOAGULATION CONSULT NOTE   Pharmacy Consult for Heparin Indication: atrial fibrillation with possible LE embolus  Allergies  Allergen Reactions  . Codeine Itching  . Bee Venom Other (See Comments)    "Feels like his head is on fire when he goes outside in the sun"  . Hydrocodone-Acetaminophen Other (See Comments)  . Other Other (See Comments)    "Feels like his head is on fire when he goes outside in the sun"  . Cephalexin   . Vicodin [Hydrocodone-Acetaminophen] Itching  . Zocor [Simvastatin] Other (See Comments)    Myalgias    Patient Measurements: Weight: 101.1 kg (222 lb 14.2 oz)  Vital Signs: BP: 110/89 (04/24 1000) Pulse Rate: 71 (04/24 1000)  Labs: Recent Labs    02/14/21 2127 02/15/21 0250 02/15/21 0531 02/15/21 1322 02/15/21 2122 02/16/21 0622 02/16/21 0907 02/17/21 0508  HGB 12.5* 12.6*  --   --   --  12.7*  --  12.6*  HCT 37.5* 38.5*  --   --   --  38.7*  --  37.7*  PLT 216 226  --   --   --  222  --  210  APTT 46* 43*  --   --   --   --   --   --   LABPROT 18.1*  --   --   --   --   --   --   --   INR 1.5*  --   --   --   --   --   --   --   HEPARINUNFRC  --  0.21*  --    < > 0.24* 0.43  --  0.36  CREATININE 0.99 1.01 1.03  --   --   --  1.24 1.14   < > = values in this interval not displayed.    Estimated Creatinine Clearance: 83.4 mL/min (by C-G formula based on SCr of 1.14 mg/dL).   Medical History: Past Medical History:  Diagnosis Date  . Acute combined systolic and diastolic CHF, NYHA class 1 (HCC) 08/07/2016  . Anemia   . Atrial fibrillation (HCC)   . BACK PAIN 02/11/2010   Qualifier: Diagnosis of  By: Manson Passey, RN, BSN, Lauren    . CAD (coronary artery disease) 02/11/2010   Qualifier: Diagnosis of  By: Manson Passey, RN, BSN, Lauren    . Cellulitis 09/29/2017  . Cerebrovascular disease   . CHEST PAIN 02/11/2010   Qualifier: Diagnosis of  By: Manson Passey, RN, BSN, Lauren    . Chronic anticoagulation 03/25/2018  . Chronic pain syndrome   . Chronic  systolic congestive heart failure (HCC) 03/25/2018  . Community acquired pneumonia 08/11/2016  . DEPRESSION/ANXIETY 02/11/2010   Qualifier: Diagnosis of  By: Manson Passey, RN, BSN, Lauren    . Diabetes mellitus, type 2 (HCC) 02/11/2010   Qualifier: Diagnosis of  By: Manson Passey, RN, BSN, Lauren    . Diabetic ulcer of toe of right foot associated with type 2 diabetes mellitus (HCC) 10/01/2017  . ESOPHAGEAL STRICTURE 02/11/2010   Qualifier: Diagnosis of  By: Manson Passey, RN, BSN, Lauren    . GERD (gastroesophageal reflux disease)   . HEADACHE 02/11/2010   Qualifier: Diagnosis of  By: Manson Passey, RN, BSN, Lauren    . History of CVA (cerebrovascular accident) 09/29/2017  . History of drug abuse (HCC) 08/05/2016  . Hypercholesteremia   . Hyperlipemia 02/11/2010   Qualifier: Diagnosis of  By: Manson Passey, RN, BSN, Lauren    . Hyperlipidemia   . Hypertension 02/11/2010  Qualifier: Diagnosis of  By: Manson Passey, RN, BSN, Lauren    . HYPERTENSION, UNSPECIFIED 02/11/2010   Qualifier: Diagnosis of  By: Manson Passey, RN, BSN, Lauren    . Hypogonadism male   . 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 Status post thrombectomy  . Osteoarthritis 08/05/2016  . Osteomyelitis, unspecified (HCC) 10/06/2017  . PAF (paroxysmal atrial fibrillation) (HCC) 08/05/2016  . Polysubstance abuse (HCC) 09/29/2017  . Rotator cuff syndrome    Shoulder  . Shoulder pain   . 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 Sleep apnea   . Stroke (HCC) 08/05/2016  . Tobacco use 08/11/2016    Assessment: 60 y.o. gentleman with medical history of DM, HTN, strokes, and A. Fib on rate control and systemic AC who presents as a transfer from [facility] ED for evaluation of limb ischemic and septic shock.  Per medical record had been on Xarelto, unclear when patient had his last dose. Patient started on Heparin infusion at 1400 units/hr at [place]. Pharmacy consulted  to follow heparin drip.   Heparin level therapeutic at 0.36 on drip rate 1900 units/hr. CBC stable. No overt bleeding or infusion issues noted. Plans noted for L amputation this week.   Goal of Therapy:  Heparin level 0.3-0.7 units/ml  Monitor platelets by anticoagulation protocol: Yes   Plan:  Continue IV heparin at 1900 units/hr Monitor daily HL, CBC, s/sx bleeding  Tama Headings, PharmD, BCPS PGY2 Cardiology Pharmacy Resident Phone: [phone] 02/17/2021  10:22 AM  Please check [email or web address] for unit-specific pharmacy phone numbers.

## 2021-02-17 NOTE — Progress Notes (Signed)
Pt transferred to [facility] via hospital bed and monitor.  RN and NT transferred pt.  POC continued.

## 2021-02-17 NOTE — Progress Notes (Signed)
[facility] [place] PROGRESS NOTE  ASSESSMENT / PLAN: Fernando Maddox is a 60 y.o. male found down x 3 days with left forefoot ulceration and gangrene. Concern for RLE arterial occlusion initially, unremarkable non-invasive vascular studies yesterday. Orthopedics consulted and planning left TMA next week. Will follow along with you.  SUBJECTIVE: Sleeping comfortably  OBJECTIVE: BP 110/89   Pulse 71   Temp 98.6 F (37 C) (Bladder)   Resp (!) 22   Wt 101.1 kg   SpO2 96%   BMI 27.86 kg/m   Intake/Output Summary (Last 24 hours) at 02/17/2021 1056 Last data filed at 02/17/2021 1020 Gross per 24 hour  Intake 2475.5 ml  Output 4010 ml  Net -1534.5 ml    No distress RRR Unlabored Unchanged appearance of BLE Strong DS in DP bilaterally  CBC Latest Ref Rng & Units 02/17/2021 02/16/2021 02/15/2021  WBC 4.0 - 10.5 K/uL 11.7(H) 11.4(H) 16.1(H)  Hemoglobin 13.0 - 17.0 g/dL 12.6(L) 12.7(L) 12.6(L)  Hematocrit 39.0 - 52.0 % 37.7(L) 38.7(L) 38.5(L)  Platelets 150 - 400 K/uL 210 222 226     CMP Latest Ref Rng & Units 02/17/2021 02/16/2021 02/15/2021  Glucose 70 - 99 mg/dL 062(I) 82 948(N)  BUN 6 - 20 mg/dL 18 18 11   Creatinine 0.61 - 1.24 mg/dL 4.62 7.03  Sodium 135 - 145 mmol/L 122(L) 123(L) 126(L)  Potassium 3.5 - 5.1 mmol/L 3.7 3.9 3.7  Chloride 98 - 111 mmol/L 90(L) 91(L) 91(L)  CO2 22 - 32 mmol/L 22 20(L) 20(L)  Calcium 8.9 - 10.3 mg/dL 8.0(L) 7.9(L) 7.5(L)  Total Protein 6.5 - 8.1 g/dL - - -  Total Bilirubin 0.3 - 1.2 mg/dL - - -  Alkaline Phos 38 - 126 U/L - - -  AST 15 - 41 U/L - - -  ALT 0 - 44 U/L - - -    Estimated Creatinine Clearance: 83.4 mL/min (by C-G formula based on SCr of 1.14 mg/dL).  5.00. Rande Brunt, MD [facility] Phone Number: [phone] 02/17/2021 10:56 AM

## 2021-02-17 NOTE — Consult Note (Signed)
Cardiology Consultation:   Patient ID: Fernando Maddox MRN: [id number]; DOB: [birth year]  Admit date: 02/14/2021 Date of Consult: 02/17/2021  PCP:  Crist Fat, MD   [facility]  Cardiologist:  Garwin Brothers, MD  Advanced Practice Provider:  No care team member to display Electrophysiologist:  None    Patient Profile:   Fernando Maddox is a 60 y.o. male with a hx of CAD (notes indicate possible prior stenting?), permanent afib, HFrEF, HLD, HTN, DM, CVA, PAD, OSA on CPAP and tobacco abuse who is being seen today for the evaluation of CHF/Afib at the request of Dr. Allena Katz.  History of Present Illness:   Fernando Maddox is a 60 year old male with past medical history noted above.  Appears his care has been very fragmented between [facility], [facility] and [facility].  Has a longstanding history of permanent atrial fibrillation on anticoagulation with Xarelto.  Notes indicate a history of CAD with possible PCI and stenting though I am unable to locate any of these records. He says he thinks he had stents put in back in 2007.  He has also been followed by vascular surgery for nonhealing wounds to bilateral feet.  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  Seen by Dr. Darrick Penna 03/2018 with persistent but very superficial wounds to the left foot with no large vessel occlusive disease.  He was offered amputation of the tip of toes for pain control but however preferred to continue local follow-up with the wound care clinic in [place].  He was last seen in the cardiology office with Dr. Tomie China on 07/2020 in follow-up after a recent hospital admission to [facility].  Notes indicate his EF was moderately depressed.  No medications were adjusted at this visit.  Fernando Maddox presented to [facility] with complaints of shortness of breath for several days and reported to had repeated ED visits between [facility] and [place] with persistent shortness of breath and abdominal  pain.  Had not been feeling well for the past several days.  Currently lives with his mother who came to check on him and found that he was down and minimally responsive. Laid on the floor for 2 days. He was hypotensive on EMS arrival.  At [facility] was found to have gangrenous appearance to his right lower extremity with erythema and edema to the left lower extremity.  CT angio was performed which showed concern for right arterial occlusion.  He was started on IV heparin.  Vascular was consulted with recommendations that he be transferred to [facility] for further evaluation.  He was started on antibiotics.  Admitted to PCCM for further management.  Seen by Dr. Myra Gianotti on arrival on 4/21.  Patient was obtunded at that time.  Formal vascular studies noted ABI within normal range bilaterally.  Lower extremity Dopplers were negative for DVT.  Orthopedics was consulted and seen by Dr. Lajoyce Corners with recommendation for left transmetatarsal amputation.   Labs notable for hyponatremia range of 126-122, initially hypokalemic with a potassium of 2.9, creatinine 0.9>> 1 4, WBC 16.1>> 11.7, TSH 4.4, hemoglobin A1c 7.8.  Chest x-ray negative.  Foot x-ray concerning for septic arthritis at first interphalangeal joint with possible osteomyelitis in proximal distal phalanges.  EKG on admission showed atrial fibrillation with RVR, 109 bpm, IVCD, PVCs.  Echocardiogram 4/22 showed EF of 20 to 25% with global hypokinesis, RV severely reduced, with moderate enlargement, LA/RA moderately dilated, mild to moderate tricuspid regurgitation.  No LV  thrombus noted. Started on IV amiodarone on admission, initially given IV Lasix 60 mg every 6 hours which was reduced to 40 mg daily.  Remains on IV heparin for anticoagulation.  In talking with the patient he denies any foot pain at present, mostly complains of intermittent shortness of breath. Says he has been taking lasix at home PTA, but does not seem familiar with Xarelto?   Past Medical  History:  Diagnosis Date  . Acute combined systolic and diastolic CHF, NYHA class 1 (HCC) 08/07/2016  . Anemia   . Atrial fibrillation (HCC)   . BACK PAIN 02/11/2010   Qualifier: Diagnosis of  By: Manson Passey, RN, BSN, Lauren    . CAD (coronary artery disease) 02/11/2010   Qualifier: Diagnosis of  By: Manson Passey, RN, BSN, Lauren    . Cellulitis 09/29/2017  . Cerebrovascular disease   . CHEST PAIN 02/11/2010   Qualifier: Diagnosis of  By: Manson Passey, RN, BSN, Lauren    . Chronic anticoagulation 03/25/2018  . Chronic pain syndrome   . Chronic systolic congestive heart failure (HCC) 03/25/2018  . Community acquired pneumonia 08/11/2016  . DEPRESSION/ANXIETY 02/11/2010   Qualifier: Diagnosis of  By: Manson Passey, RN, BSN, Lauren    . Diabetes mellitus, type 2 (HCC) 02/11/2010   Qualifier: Diagnosis of  By: Manson Passey, RN, BSN, Lauren    . Diabetic ulcer of toe of right foot associated with type 2 diabetes mellitus (HCC) 10/01/2017  . ESOPHAGEAL STRICTURE 02/11/2010   Qualifier: Diagnosis of  By: Manson Passey, RN, BSN, Lauren    . GERD (gastroesophageal reflux disease)   . HEADACHE 02/11/2010   Qualifier: Diagnosis of  By: Manson Passey, RN, BSN, Lauren    . History of CVA (cerebrovascular accident) 09/29/2017  . History of drug abuse (HCC) 08/05/2016  . Hypercholesteremia   . Hyperlipemia 02/11/2010   Qualifier: Diagnosis of  By: Manson Passey, RN, BSN, Lauren    . Hyperlipidemia   . Hypertension 02/11/2010   Qualifier: Diagnosis of  By: Manson Passey, RN, BSN, Lauren    . HYPERTENSION, UNSPECIFIED 02/11/2010   Qualifier: Diagnosis of  By: Manson Passey, RN, BSN, Lauren    . Hypogonadism male   . 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 Status post thrombectomy  . Osteoarthritis 08/05/2016  . Osteomyelitis, unspecified (HCC) 10/06/2017  . PAF (paroxysmal atrial fibrillation) (HCC) 08/05/2016  . Polysubstance abuse (HCC) 09/29/2017  . Rotator cuff syndrome    Shoulder  .  Shoulder pain   . 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 Sleep apnea   . Stroke (HCC) 08/05/2016  . Tobacco use 08/11/2016    Past Surgical History:  Procedure Laterality Date  . NO PAST SURGERIES       Home Medications:  Prior to Admission medications   Medication Sig Start Date End Date Taking? Authorizing Provider  folic acid (FOLVITE) 1 MG tablet Take 1 mg by mouth daily.   Yes [provider]  furosemide (LASIX) 40 MG tablet Take 40 mg by mouth.   Yes [provider]  metFORMIN (GLUCOPHAGE) 500 MG tablet Take 500 mg by mouth 2 (two) times daily. 02/04/21  Yes [provider]  metoprolol succinate (TOPROL-XL) 25 MG 24 hr tablet Take 25 mg by mouth daily.  10/22/16  Yes [provider]  rivaroxaban (XARELTO) 20 MG TABS tablet Take 20 mg by mouth daily with supper.   Yes [provider]    Inpatient Medications:  Scheduled Meds: . Chlorhexidine Gluconate Cloth  6 each Topical Q0600  . insulin aspart  0-15 Units Subcutaneous TID WC  . mupirocin ointment  1 application Nasal BID  . phosphorus  500 mg Oral TID   Continuous Infusions: . amiodarone 30 mg/hr (02/17/21 1106)  . heparin 1,900 Units/hr (02/17/21 1100)  . piperacillin-tazobactam (ZOSYN)  IV Stopped (02/17/21 0951)  . vancomycin 1,250 mg (02/17/21 0330)   PRN Meds: docusate sodium, ondansetron (ZOFRAN) IV, polyethylene glycol  Allergies:    Allergies  Allergen Reactions  . Codeine Itching  . Bee Venom Other (See Comments)    "Feels like his head is on fire when he goes outside in the sun"  . Hydrocodone-Acetaminophen Other (See Comments)  . Other Other (See Comments)    "Feels like his head is on fire when he goes outside in the sun"  . Cephalexin   . Vicodin [Hydrocodone-Acetaminophen] Itching  . Zocor [Simvastatin] Other (See Comments)    Myalgias    Social History:   Social History   Socioeconomic History  . Marital status:  Married    Spouse name: Not on file  . Number of children: Not on file  . Years of education: Not on file  . Highest education level: Not on file  Occupational History  . Not on file  Tobacco Use  . Smoking status: Former Smoker    Types: Cigarettes    Start date: 11/06/2016  . Smokeless tobacco: Former Neurosurgeon    Quit date: 03/06/2016  Substance and Sexual Activity  . Alcohol use: Not on file  . Drug use: Not on file  . Sexual activity: Not on file  Other Topics Concern  . Not on file  Social History Narrative  . Not on file   Social Determinants of Health   Financial Resource Strain: Not on file  Food Insecurity: Not on file  Transportation Needs: Not on file  Physical Activity: Not on file  Stress: Not on file  Social Connections: Not on file  Intimate Partner Violence: Not on file    Family History:    Family History  Problem Relation Age of Onset  . Hypertension Mother   . Hypertension Father      ROS:  Please see the history of present illness.   All other ROS reviewed and negative.     Physical Exam/Data:   Vitals:   02/17/21 0800 02/17/21 0900 02/17/21 1000 02/17/21 1100  BP: (!) 125/109 111/80 110/89 105/81  Pulse: 70 80 71 81  Resp: 15 (!) 22 (!) 22 14  Temp:      TempSrc:      SpO2: 90% 95% 96% 94%  Weight:        Intake/Output Summary (Last 24 hours) at 02/17/2021 1129 Last data filed at 02/17/2021 1106 Gross per 24 hour  Intake 2475.6 ml  Output 4075 ml  Net -1599.4 ml   Last 3 Weights 02/17/2021 02/16/2021 02/15/2021  Weight (lbs) 222 lb 14.2 oz 220 lb 0.3 oz 212 lb 8.4 oz  Weight (kg) 101.1 kg 99.8 kg 96.4 kg     Body mass index is 27.86 kg/m.  General:  Disheveled, appears older than stated age  HEENT: normal Lymph: no adenopathy Neck: minimal JVD Endocrine:  No thryomegaly Vascular: No carotid bruits Cardiac:  normal S1, S2; Irreg Irreg; + murmur  Lungs:  Bilateral wheezing Abd: soft, nontender, no hepatomegaly  Ext: no edema,  left foot wrapped Musculoskeletal:  No deformities Skin: warm and dry  Neuro:  CNs 2-12 intact, no focal abnormalities noted Psych:  Normal affect   EKG:  The EKG was personally reviewed and demonstrates: Atrial fibrillation, 109 bpm, IVCD, PVCs, possible inferior lateral infarct Telemetry:  Telemetry was personally reviewed and demonstrates:  Afib ranges 80-100  Relevant CV Studies:  Echo: 02/15/2021  IMPRESSIONS    1. Left ventricular ejection fraction, by estimation, is 20 to 25%. The  left ventricle has severely decreased function. The left ventricle  demonstrates global hypokinesis. The left ventricular internal cavity size  was moderately dilated. Left  ventricular diastolic function could not be evaluated. There is the  interventricular septum is flattened in systole and diastole, consistent  with right ventricular pressure and volume overload.  2. Right ventricular systolic function is severely reduced. The right  ventricular size is moderately enlarged.  3. Left atrial size was moderately dilated.  4. Right atrial size was moderately dilated.  5. The mitral valve is normal in structure. Mild to moderate mitral valve  regurgitation.  6. Tricuspid valve regurgitation is mild to moderate.  7. The aortic valve is tricuspid. There is mild calcification of the  aortic valve. Aortic valve regurgitation is not visualized.  8. Aortic dilatation noted. There is borderline dilatation of the  ascending aorta, measuring 40 mm.  9. The inferior vena cava is dilated in size with <50% respiratory  variability, suggesting right atrial pressure of 15 mmHg.   Laboratory Data:  High Sensitivity Troponin:  No results for input(s): TROPONINIHS in the last 720 hours.   Chemistry Recent Labs  Lab 02/15/21 0531 02/16/21 0907 02/17/21 0508  NA 126* 123* 122*  K 3.7 3.9 3.7  CL 91* 91* 90*  CO2 20* 20* 22  GLUCOSE 188* 82 110*  BUN 11 18 18   CREATININE 1.03 1.24 1.14   CALCIUM 7.5* 7.9* 8.0*  GFRNONAA >60 >60 >60  ANIONGAP 15 12 10     Recent Labs  Lab 02/14/21 2127  PROT 5.2*  ALBUMIN 2.6*  AST 20  ALT 23  ALKPHOS 72  BILITOT 1.8*   Hematology Recent Labs  Lab 02/15/21 0250 02/16/21 0622 02/17/21 0508  WBC 16.1* 11.4* 11.7*  RBC 4.55 4.49 4.49  HGB 12.6* 12.7* 12.6*  HCT 38.5* 38.7* 37.7*  MCV 84.6 86.2 84.0  MCH 27.7 28.3 28.1  MCHC 32.7 32.8 33.4  RDW 16.4* 16.7* 16.4*  PLT 226 222 210   BNPNo results for input(s): BNP, PROBNP in the last 168 hours.  DDimer No results for input(s): DDIMER in the last 168 hours.   Radiology/Studies:  DG Chest 1 View  Result Date: 02/15/2021 CLINICAL DATA:  Respiratory failure. EXAM: CHEST  1 VIEW COMPARISON:  February 14, 2021. FINDINGS: Stable cardiomegaly. No pneumothorax or pleural effusion is noted. Stable position of right internal jugular catheter line. No acute pulmonary disease is noted. Old bilateral rib fractures are noted. IMPRESSION: No active disease. Electronically Signed   By: Lupita RaiderJames  Green Jr M.D.   On: 02/15/2021 15:47   DG Foot 2 Views Left  Result Date: 02/15/2021 CLINICAL DATA:  Foot ulcer. EXAM: LEFT FOOT - 2 VIEW COMPARISON:  None. FINDINGS: There is no evidence of fracture or dislocation. Severe irregularity is seen involving the first interphalangeal joint with lytic destruction of the adjacent articular surfaces of the proximal distal phalanges. This is concerning for possible septic arthritis with adjacent osteomyelitis. Soft tissue swelling of the first toe is noted suggesting cellulitis. Degenerative changes are also seen involving the distal interphalangeal joints of  the second and third toes. IMPRESSION: Findings concerning for possible septic arthritis of the first interphalangeal joint with possible osteomyelitis of the adjacent proximal distal phalanges. MRI may be performed further evaluation. Electronically Signed   By: Lupita Raider M.D.   On: 02/15/2021 15:50   VAS [place]  ABI WITH/WO TBI  Result Date: 02/15/2021  LOWER EXTREMITY DOPPLER STUDY Patient Name:  BRYNDEN THUNE  Date of Exam:   02/15/2021 Medical Rec #: [id number]      Accession #:    [id number] Date of Birth: Oct 21, 1961      Patient Gender: M Patient Age:   59Y Exam Location:  [facility] Procedure:      VAS [place] ABI WITH/WO TBI Referring Phys: [id number] Nada Libman --------------------------------------------------------------------------------  High Risk Factors: Hypertension, hyperlipidemia, Diabetes, current smoker,                    coronary artery disease, prior CVA. Other Factors: CHF, Afib, Polysubstance abuse.  Comparison Study: Previous exam 04/15/18 -WNL Performing Technologist: Ernestene Mention RVT, RDMS  Examination Guidelines: A complete evaluation includes at minimum, Doppler waveform signals and systolic blood pressure reading at the level of bilateral brachial, anterior tibial, and posterior tibial arteries, when vessel segments are accessible. Bilateral testing is considered an integral part of a complete examination. Photoelectric Plethysmograph (PPG) waveforms and toe systolic pressure readings are included as required and additional duplex testing as needed. Limited examinations for reoccurring indications may be performed as noted.  ABI Findings: +--------+------------------+-----+----------------+--------+ Right   Rt Pressure (mmHg)IndexWaveform        Comment  +--------+------------------+-----+----------------+--------+ [id number]                    biphasic                 +--------+------------------+-----+----------------+--------+ PTA     117               1.04 audibly biphasic         +--------+------------------+-----+----------------+--------+ DP      111               0.98 audibly biphasic         +--------+------------------+-----+----------------+--------+ +--------+------------------+-----+----------------+-------+ Left    Lt Pressure (mmHg)IndexWaveform         Comment +--------+------------------+-----+----------------+-------+ [id number]                    triphasic               +--------+------------------+-----+----------------+-------+ PTA     106               0.94 audibly biphasic        +--------+------------------+-----+----------------+-------+ DP      115               1.02 audibly biphasic        +--------+------------------+-----+----------------+-------+ +-------+-----------+-----------+------------+------------+ ABI/TBIToday's ABIToday's TBIPrevious ABIPrevious TBI +-------+-----------+-----------+------------+------------+ Right  1.04       0          1.23                     +-------+-----------+-----------+------------+------------+ Left   1.02       0          1.15                     +-------+-----------+-----------+------------+------------+ Doppler readings are unreliable due to patient constantly moving and Afib.  Summary: Right: Resting right ankle-brachial index is within normal range. No evidence of significant right lower extremity arterial disease. The right toe-brachial index is absent. Left: Resting left ankle-brachial index is within normal range. No evidence of significant left lower extremity arterial disease. The left toe-brachial index is absent.  *See table(s) above for measurements and observations.  Electronically signed by Heath Lark on 02/15/2021 at 5:25:40 PM.    Final    ECHOCARDIOGRAM COMPLETE  Result Date: 02/15/2021    ECHOCARDIOGRAM REPORT   Patient Name:   BRAYDN CARNEIRO Date of Exam: 02/15/2021 Medical Rec #:  [id number]     Height:       75.0 in Accession #:    [id number]    Weight:       212.5 lb Date of Birth:  25-Aug-1961     BSA:          2.252 m Patient Age:    59 years      BP:           100/72 mmHg Patient Gender: M             HR:           103 bpm. Exam Location:  Inpatient Procedure: 2D Echo, Color Doppler, Cardiac Doppler and Intracardiac            Opacification Agent  Indications:    Stroke  History:        Patient has no prior history of Echocardiogram examinations.                 CHF, Abnormal ECG, Stroke, Arrythmias:Atrial Fibrillation,                 Signs/Symptoms:Chest Pain, Shortness of Breath and Dyspnea; Risk                 Factors:Current Smoker, Diabetes, Hypertension and Dyslipidemia.                 Septic shock.  Sonographer:    Sheralyn Boatman RDCS Referring Phys: [id number] Olene Craven DESAI  Sonographer Comments: Technically difficult study due to poor echo windows and echo performed with patient supine and on artificial respirator. IMPRESSIONS  1. Left ventricular ejection fraction, by estimation, is 20 to 25%. The left ventricle has severely decreased function. The left ventricle demonstrates global hypokinesis. The left ventricular internal cavity size was moderately dilated. Left ventricular diastolic function could not be evaluated. There is the interventricular septum is flattened in systole and diastole, consistent with right ventricular pressure and volume overload.  2. Right ventricular systolic function is severely reduced. The right ventricular size is moderately enlarged.  3. Left atrial size was moderately dilated.  4. Right atrial size was moderately dilated.  5. The mitral valve is normal in structure. Mild to moderate mitral valve regurgitation.  6. Tricuspid valve regurgitation is mild to moderate.  7. The aortic valve is tricuspid. There is mild calcification of the aortic valve. Aortic valve regurgitation is not visualized.  8. Aortic dilatation noted. There is borderline dilatation of the ascending aorta, measuring 40 mm.  9. The inferior vena cava is dilated in size with <50% respiratory variability, suggesting right atrial pressure of 15 mmHg. FINDINGS  Left Ventricle: Left ventricular ejection fraction, by estimation, is 20 to 25%. The left ventricle has severely decreased function. The left ventricle demonstrates global hypokinesis. Definity  contrast agent was given IV to delineate the left ventricular endocardial borders. The left ventricular internal  cavity size was moderately dilated. There is no left ventricular hypertrophy. The interventricular septum is flattened in systole and diastole, consistent with right ventricular pressure and volume overload. Left ventricular diastolic function could not be evaluated due to atrial fibrillation. Left ventricular diastolic function could not be evaluated. Right Ventricle: The right ventricular size is moderately enlarged. No increase in right ventricular wall thickness. Right ventricular systolic function is severely reduced. Left Atrium: Left atrial size was moderately dilated. Right Atrium: Right atrial size was moderately dilated. Pericardium: There is no evidence of pericardial effusion. Mitral Valve: The mitral valve is normal in structure. Mild to moderate mitral valve regurgitation. Tricuspid Valve: The tricuspid valve is normal in structure. Tricuspid valve regurgitation is mild to moderate. Aortic Valve: The aortic valve is tricuspid. There is mild calcification of the aortic valve. Aortic valve regurgitation is not visualized. Pulmonic Valve: The pulmonic valve was grossly normal. Pulmonic valve regurgitation is not visualized. Aorta: Aortic dilatation noted. There is borderline dilatation of the ascending aorta, measuring 40 mm. Venous: The inferior vena cava is dilated in size with less than 50% respiratory variability, suggesting right atrial pressure of 15 mmHg. IAS/Shunts: No atrial level shunt detected by color flow Doppler.  LEFT VENTRICLE PLAX 2D LVIDd:         6.20 cm LVIDs:         5.50 cm LV PW:         1.60 cm LV IVS:        1.30 cm  LV Volumes (MOD) LV vol d, MOD A2C: 162.0 ml LV vol d, MOD A4C: 166.0 ml LV vol s, MOD A2C: 116.0 ml LV vol s, MOD A4C: 129.0 ml LV SV MOD A2C:     46.0 ml LV SV MOD A4C:     166.0 ml LV SV MOD BP:      42.7 ml RIGHT VENTRICLE            IVC RV S prime:      4.43 cm/s  IVC diam: 3.00 cm TAPSE (M-mode): 0.8 cm LEFT ATRIUM              Index       RIGHT ATRIUM           Index LA diam:        4.90 cm  2.18 cm/m  RA Area:     21.60 cm LA Vol (A2C):   103.0 ml 45.74 ml/m RA Volume:   63.70 ml  28.29 ml/m LA Vol (A4C):   101.0 ml 44.85 ml/m LA Biplane Vol: 105.0 ml 46.63 ml/m  AORTIC VALVE LVOT Vmax:   66.20 cm/s LVOT Vmean:  47.000 cm/s LVOT VTI:    0.092 m  AORTA Ao Root diam: 3.90 cm Ao Asc diam:  4.00 cm MITRAL VALVE                TRICUSPID VALVE MV Area (PHT): 5.66 cm     TR Peak grad:   19.4 mmHg MV Decel Time: 134 msec     TR Vmax:        220.00 cm/s MR Peak grad: 71.6 mmHg MR Mean grad: 38.0 mmHg     SHUNTS MR Vmax:      423.00 cm/s   Systemic VTI: 0.09 m MR Vmean:     271.0 cm/s MV E velocity: 102.00 cm/s Arvilla Meres MD Electronically signed by Arvilla Meres MD Signature Date/Time: 02/15/2021/5:10:21 PM    Final    VAS [place]  LOWER EXTREMITY ARTERIAL DUPLEX  Result Date: 02/15/2021 LOWER EXTREMITY ARTERIAL DUPLEX STUDY Patient Name:  AMADO ANDAL  Date of Exam:   02/15/2021 Medical Rec #: [id number]      Accession #:    [id number] Date of Birth: 1961-08-16      Patient Gender: M Patient Age:   59Y Exam Location:  [facility] Procedure:      VAS [place] LOWER EXTREMITY ARTERIAL DUPLEX Referring Phys: [id number] Nada Libman --------------------------------------------------------------------------------  Indications: Gangrene, peripheral artery disease, and Follow up CTA findings-              concern for mid SFA occlusive disease. High Risk Factors: Hypertension, hyperlipidemia, Diabetes, coronary artery                    disease, prior CVA.  Current ABI: RT 1.04/0.00 LT 1.02/0.00 Comparison Study: 02-15-2021 Most recent ABI w/ TBI study, 04-15-2018 prior ABI                   was RT 1.23 LT 1.15 Performing Technologist: Jean Rosenthal RDMS,RVT  Examination Guidelines: A complete evaluation includes B-mode imaging, spectral Doppler, color Doppler, and  power Doppler as needed of all accessible portions of each vessel. Bilateral testing is considered an integral part of a complete examination. Limited examinations for reoccurring indications may be performed as noted.  +-----------+--------+-----+--------+---------+--------+ RIGHT      PSV cm/sRatioStenosisWaveform Comments +-----------+--------+-----+--------+---------+--------+ CFA Prox   87                   triphasic         +-----------+--------+-----+--------+---------+--------+ CFA Mid    70                   triphasic         +-----------+--------+-----+--------+---------+--------+ CFA Distal 68                   triphasic         +-----------+--------+-----+--------+---------+--------+ DFA        60                   biphasic          +-----------+--------+-----+--------+---------+--------+ SFA Prox   78                   triphasic         +-----------+--------+-----+--------+---------+--------+ SFA Mid    70                   triphasic         +-----------+--------+-----+--------+---------+--------+ SFA Distal 70                   triphasic         +-----------+--------+-----+--------+---------+--------+ POP Prox   71                   triphasic         +-----------+--------+-----+--------+---------+--------+ POP Mid    53                   triphasic         +-----------+--------+-----+--------+---------+--------+ POP Distal 54                   triphasic         +-----------+--------+-----+--------+---------+--------+ TP Trunk   51  triphasic         +-----------+--------+-----+--------+---------+--------+ ATA Distal 36                   biphasic          +-----------+--------+-----+--------+---------+--------+ PTA Distal 45                   biphasic          +-----------+--------+-----+--------+---------+--------+ PERO Distal35                   biphasic           +-----------+--------+-----+--------+---------+--------+ DP         34                   biphasic          +-----------+--------+-----+--------+---------+--------+  Summary: Right: No evidence of stenosis or occlusive disease.  See table(s) above for measurements and observations. Electronically signed by Heath Lark on 02/15/2021 at 5:19:51 PM.    Final    VAS [place] LOWER EXTREMITY VENOUS (DVT)  Result Date: 02/15/2021  Lower Venous DVT Study Patient Name:  RENEE BEALE  Date of Exam:   02/15/2021 Medical Rec #: [id number]      Accession #:    [id number] Date of Birth: October 09, 1961      Patient Gender: M Patient Age:   59Y Exam Location:  [place] Procedure:      VAS [place] LOWER EXTREMITY VENOUS (DVT) Referring Phys: [id number] Nada Libman --------------------------------------------------------------------------------  Indications: Erythema and edema,.  Anticoagulation: Heparin. Limitations: Poor ultrasound/tissue interface. Comparison Study: No prior studies. Performing Technologist: Jean Rosenthal RDMS,RVT  Examination Guidelines: A complete evaluation includes B-mode imaging, spectral Doppler, color Doppler, and power Doppler as needed of all accessible portions of each vessel. Bilateral testing is considered an integral part of a complete examination. Limited examinations for reoccurring indications may be performed as noted. The reflux portion of the exam is performed with the patient in reverse Trendelenburg.  +-----+---------------+---------+-----------+----------+--------------+ RIGHTCompressibilityPhasicitySpontaneityPropertiesThrombus Aging +-----+---------------+---------+-----------+----------+--------------+ CFV  Full           Yes      Yes                                 +-----+---------------+---------+-----------+----------+--------------+   +---------+---------------+---------+-----------+----------+-------------------+ LEFT      CompressibilityPhasicitySpontaneityPropertiesThrombus Aging      +---------+---------------+---------+-----------+----------+-------------------+ CFV      Full           Yes      Yes                                      +---------+---------------+---------+-----------+----------+-------------------+ SFJ      Full                                                             +---------+---------------+---------+-----------+----------+-------------------+ FV Prox  Full                                                             +---------+---------------+---------+-----------+----------+-------------------+  FV Mid   Full                                                             +---------+---------------+---------+-----------+----------+-------------------+ FV DistalFull                                                             +---------+---------------+---------+-----------+----------+-------------------+ PFV      Full                                                             +---------+---------------+---------+-----------+----------+-------------------+ POP      Full           Yes      Yes                                      +---------+---------------+---------+-----------+----------+-------------------+ PTV      Full                                                             +---------+---------------+---------+-----------+----------+-------------------+ PERO                    Yes      Yes                  Not well visualized +---------+---------------+---------+-----------+----------+-------------------+    Summary: RIGHT: - No evidence of common femoral vein obstruction.  LEFT: - There is no evidence of deep vein thrombosis in the lower extremity. However, portions of this examination were limited- see technologist comments above.  - No cystic structure found in the popliteal fossa.  *See table(s) above for measurements and  observations. Electronically signed by Heath Lark on 02/15/2021 at 5:20:04 PM.    Final      Assessment and Plan:   KAMAREON SCIANDRA is a 60 y.o. male with a hx of CAD (notes indicate possible prior stenting?), permanent afib, HFrEF, HLD, HTN, DM, CVA, PAD, OSA on CPAP and tobacco abuse who is being seen today for the evaluation of CHF/Afib at the request of Dr. Allena Katz.  1. HFrEF with new RV failure: has known hx with EF of 30% based on the last echo from 2017. Unclear etiology. Reports possibly having stents in 2007?, unable to find reports. Also could be tachy-mediated with known hx of Afib. EF noted further decline of 20-25% this admission, with severely reduced RV. Has been on IV lasix, net -2.4L thus far. Weaned from pressor support, appears warm and dry at present -- blood pressures are soft, therefore unable to add GDMT at this point -- decent UOP with IV lasix  2. Permanent  Afib: rates are reasonable controlled on IV amiodarone. Blood pressures remain soft at present -- suppose to be on Xarelto PTA, but unclear if he was taking -- continue IV heparin/amioadrone  3. Septic shock in the setting of gangrenous LLE: initially required pressor support on transfer but now weaned.  -- antibiotics per primary -- seen by [facility]VS and ortho--> planned for TMA this week  4. Hyponatremia: Na+ 126>>122 -- per primary  5. 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 HTN: as above, initially hypotensive. -- no room for meds at present  7. HLD: doesn't appear he was on statin PTA -- check lipids in am  8. DM: Hgb A1c 7.8, metformin PTA, held while inpatient -- SSI  9. Polysubstance abuse: UDS + for THC and amphetamines   10. Tobacco use: cessation advised    Risk Assessment/Risk Scores:    New York Heart Association (NYHA) Functional Class NYHA Class III  CHA2DS2-VASc Score = 6  This indicates a 9.7% annual risk of stroke. The patient's score is based upon: CHF History: Yes HTN History:  Yes Diabetes History: Yes Stroke History: Yes Vascular Disease History: Yes Age Score: 0 Gender Score: 0     For questions or updates, please contact [facility] Please consult [email or web address] for contact info under    Signed, Laverda Page, NP  02/17/2021 11:29 AM

## 2021-02-17 NOTE — Progress Notes (Signed)
[facility] Progress Note  Patient: Fernando Maddox    KYH:[id number]  DOA: 02/14/2021     Date of Service: the patient was seen and examined on 02/17/2021  Brief hospital course: Past medical history of type II DM, HTN, CVA, A. fib, chronic systolic CHF, CAD, substance abuse.  Patient presents with complaints of unresponsiveness with gangrene of bilateral lower extremity and septic shock. 4/21 admitted to [facility] from [facility] ED, central line placed, heparin drip started and vascular surgery consulted.  CT angio shows acute right arterial occlusion but Doppler negative for any obstruction 4/23 orthopedic consulted for amputation. 4/24 cardiology consulted for heart failure management.  Currently plan is monitor orthopedic and vascular surgery recommendation regarding gangrene and infection, monitor cardiology recommendation regarding volume management, monitor sodium.  Assessment and Plan: 1.  Critical limb ischemia PAD Diabetic foot with gangrene Septic shock POA Presents with unresponsiveness, found to have gangrene-like features of the right leg.  Also has left leg edema CT scan concerning for arterial occlusion of the right mid superficial femoral artery. ABI shows index within normal range bilaterally. Vascular surgery recommends no intervention for now. Currently on therapeutic heparin drip. Orthopedic consulted, currently scheduled for left TMA.  Optimized medically before surgery. Currently on IV vancomycin and Zosyn.  No blood culture done on admission.  We will follow-up with [facility] blood cultures. Will likely narrow the antibiotic tomorrow since MRSA PCR is negative.  2.  Septic shock POA secondary to gangrene of extremity Also possible hypovolemic shock. Unresponsive event at home Acute metabolic encephalopathy Presented with unresponsiveness.  Possibly down for 3 days at home. At [facility] patient was started on Levophed.  Currently discontinued. On IV  vancomycin and Zosyn. UA negative.  Blood cultures not available here. Pressures are improving. Not a good candidate to initiate midodrine. Mentation appears to be improving but still does not appear to be back to baseline. No focal deficit therefore no further work-up. Will initiate further metabolic work-up tomorrow.  3.  CAD Acute on chronic combined CHF. ?  Cor pulmonale Echocardiogram shows diastolic dysfunction. Prior EF of 35%.  Recent EF is 20%. Also shows RV failure. Cardiology consulted. Appreciate assistance. Lasix as needed. Monitor on telemetry.  Not a good candidate for interventional work-up.  4.  A. fib with RVR Known prior history. Currently on IV amiodarone. Management per cardiology. On IV heparin drip as well. Transition to oral when surgery is completed. Monitor on telemetry progressive care unit.  5.  Hyponatremia, likely multifactorial. Will initiate further work-up. Currently will initiate fluid restriction and monitor.  6.  Hypokalemia, hypomagnesemia, hypophosphatemia Currently replacing.  7.  Insomnia. Ambien as needed nightly.  8.  History of substance abuse. No UDS performed. UDS 3 years ago was only positive for cannabis.  9.  OSA. Likely undiagnosed. Continue CPAP per pulmonary. Monitor.  Body mass index is 27.86 kg/m.    Interventions:    Pressure Injury 02/14/21 Sacrum Medial;Upper Deep Tissue Pressure Injury - Purple or maroon localized area of discolored intact skin or blood-filled blister due to damage of underlying soft tissue from pressure and/or shear. (Active)  02/14/21 2030  Location: Sacrum  Location Orientation: Medial;Upper  Staging: Deep Tissue Pressure Injury - Purple or maroon localized area of discolored intact skin or blood-filled blister due to damage of underlying soft tissue from pressure and/or shear.  Wound Description (Comments):   Present on Admission: Yes     Diet: Cardiac diet DVT Prophylaxis:  Therapeutic heparin  Advance goals of care discussion: Full code  Family Communication: no family was present at bedside, at the time of interview.   Disposition:  Status is: Inpatient  Remains inpatient appropriate because:IV treatments appropriate due to intensity of illness or inability to take PO   Dispo: The patient is from: Home              Anticipated d/c is to: Home              Patient currently is not medically stable to d/c.   Difficult to place patient         Subjective: No nausea no vomiting.  Reports sleepiness.  Drank 600 cc of water this morning just by 10 AM.  No diarrhea.  No bleeding.  Physical Exam:  General: Appear in mild distress, no Rash; Oral Mucosa Clear, moist. no Abnormal Neck Mass Or lumps, Conjunctiva normal  Cardiovascular: S1 and S2 Present, no Murmur, Respiratory: increased respiratory effort, Bilateral Air entry present and faint basal Crackles, no wheezes Abdomen: Bowel Sound present, Soft and no tenderness Extremities: bilateral  Pedal edema Neurology: alert and oriented to place and person affect appropriate. no new focal deficit Gait not checked due to patient safety concerns  Vitals:   02/17/21 1600 02/17/21 1700 02/17/21 1800 02/17/21 1900  BP: 106/72 100/85 107/74 101/90  Pulse:  89 (!) 131 (!) 174  Resp: 19 (!) 21 16 (!) 26  Temp:      TempSrc:      SpO2: 94% 93% 94% 94%  Weight:        Intake/Output Summary (Last 24 hours) at 02/17/2021 1950 Last data filed at 02/17/2021 1900 Gross per 24 hour  Intake 2553.08 ml  Output 3936 ml  Net -1382.92 ml   Filed Weights   02/15/21 0500 02/16/21 0500 02/17/21 0500  Weight: 96.4 kg 99.8 kg 101.1 kg    Data Reviewed: I have personally reviewed and interpreted daily labs, tele strips, imaging. I reviewed all nursing notes, pharmacy notes, vitals, pertinent old records I have discussed plan of care as described above with RN and patient/family.  CBC: Recent Labs  Lab  02/14/21 2127 02/15/21 0250 02/16/21 0622 02/17/21 0508  WBC 15.3* 16.1* 11.4* 11.7*  HGB 12.5* 12.6* 12.7* 12.6*  HCT 37.5* 38.5* 38.7* 37.7*  MCV 83.9 84.6 86.2 84.0  PLT 216 226 222 210   Basic Metabolic Panel: Recent Labs  Lab 02/14/21 2127 02/15/21 0250 02/15/21 0531 02/16/21 0622 02/16/21 0907 02/17/21 0508 02/17/21 1604  NA 126* 124* 126*  --  123* 122* 123*  K 2.9* 5.4* 3.7  --  3.9 3.7 3.2*  CL 94* 94* 91*  --  91* 90* 89*  CO2 24 22 20*  --  20* 22 23  GLUCOSE 167* 170* 188*  --  82 110* 157*  BUN 12 12 11   --  18 18 19   CREATININE 0.99 1.01 1.03  --  1.24 1.14 1.05  CALCIUM 7.9* 7.7* 7.5*  --  7.9* 8.0* 7.8*  MG 1.8  --   --  1.8  --  1.8 1.5*  PHOS 2.8  --   --   --   --  2.1*  --     Studies: No results found.  Scheduled Meds: . Chlorhexidine Gluconate Cloth  6 each Topical Q0600  . insulin aspart  0-15 Units Subcutaneous TID WC  . mouth rinse  15 mL Mouth Rinse BID  . mupirocin ointment  1 application Nasal  BID  . phosphorus  500 mg Oral TID   Continuous Infusions: . amiodarone 30 mg/hr (02/17/21 1932)  . heparin 1,900 Units/hr (02/17/21 1930)  . piperacillin-tazobactam (ZOSYN)  IV Stopped (02/17/21 1707)  . vancomycin 1,250 mg (02/17/21 1333)   PRN Meds: docusate sodium, ondansetron (ZOFRAN) IV, polyethylene glycol, zolpidem  Time spent: The patient is critically ill with multiple organ systems failure and requires high complexity decision making for assessment and support, frequent evaluation and titration of therapies. Critical Care Time devoted to patient care services described in this note is 35 minutes   Author: Lynden Oxford, MD [facility] 02/17/2021 7:50 PM  To reach On-call, see care teams to locate the attending and reach out via [email or web address]. Between 7PM-7AM, please contact night-coverage If you still have difficulty reaching the attending provider, please page the [facility] (Director on Call) for [facility] on amion for  assistance.

## 2021-02-18 ENCOUNTER — Other Ambulatory Visit: Payer: Self-pay

## 2021-02-18 ENCOUNTER — Other Ambulatory Visit: Payer: Self-pay | Admitting: Physician Assistant

## 2021-02-18 ENCOUNTER — Inpatient Hospital Stay (HOSPITAL_COMMUNITY): Payer: Medicare Other

## 2021-02-18 DIAGNOSIS — R6521 Severe sepsis with septic shock: Secondary | ICD-10-CM | POA: Diagnosis not present

## 2021-02-18 DIAGNOSIS — A419 Sepsis, unspecified organism: Secondary | ICD-10-CM | POA: Diagnosis not present

## 2021-02-18 LAB — BASIC METABOLIC PANEL
Anion gap: 13 (ref 5–15)
Anion gap: 10 (ref 5–15)
Anion gap: 11 (ref 5–15)
BUN: 17 mg/dL (ref 6–20)
BUN: 17 mg/dL (ref 6–20)
BUN: 18 mg/dL (ref 6–20)
CO2: 20 mmol/L — ABNORMAL LOW (ref 22–32)
CO2: 25 mmol/L (ref 22–32)
CO2: 23 mmol/L (ref 22–32)
Calcium: 7.9 mg/dL — ABNORMAL LOW (ref 8.9–10.3)
Calcium: 7.7 mg/dL — ABNORMAL LOW (ref 8.9–10.3)
Calcium: 7.7 mg/dL — ABNORMAL LOW (ref 8.9–10.3)
Chloride: 90 mmol/L — ABNORMAL LOW (ref 98–111)
Chloride: 88 mmol/L — ABNORMAL LOW (ref 98–111)
Chloride: 88 mmol/L — ABNORMAL LOW (ref 98–111)
Creatinine, Ser: 1.06 mg/dL (ref 0.61–1.24)
Creatinine, Ser: 1.06 mg/dL (ref 0.61–1.24)
Creatinine, Ser: 1.2 mg/dL (ref 0.61–1.24)
GFR, Estimated: >60 mL/min (ref >60)
GFR, Estimated: >60 mL/min (ref >60)
GFR, Estimated: >60 mL/min (ref >60)
Glucose, Bld: 102 mg/dL — ABNORMAL HIGH (ref 70–99)
Glucose, Bld: 126 mg/dL — ABNORMAL HIGH (ref 70–99)
Glucose, Bld: 220 mg/dL — ABNORMAL HIGH (ref 70–99)
Potassium: 3.4 mmol/L — ABNORMAL LOW (ref 3.5–5.1)
Potassium: 3.2 mmol/L — ABNORMAL LOW (ref 3.5–5.1)
Potassium: 3.9 mmol/L (ref 3.5–5.1)
Sodium: 123 mmol/L — ABNORMAL LOW (ref 135–145)
Sodium: 123 mmol/L — ABNORMAL LOW (ref 135–145)
Sodium: 122 mmol/L — ABNORMAL LOW (ref 135–145)

## 2021-02-18 LAB — CBC
HCT: 39.1 % (ref 39.0–52.0)
Hemoglobin: 13.2 g/dL (ref 13.0–17.0)
MCH: 27.7 pg (ref 26.0–34.0)
MCHC: 33.8 g/dL (ref 30.0–36.0)
MCV: 82.1 fL (ref 80.0–100.0)
Platelets: 199 10*3/uL (ref 150–400)
RBC: 4.76 MIL/uL (ref 4.22–5.81)
RDW: 16.3 % — ABNORMAL HIGH (ref 11.5–15.5)
WBC: 12.4 10*3/uL — ABNORMAL HIGH (ref 4.0–10.5)
nRBC: 0 % (ref 0.0–0.2)

## 2021-02-18 LAB — GLUCOSE, CAPILLARY
Glucose-Capillary: 147 mg/dL — ABNORMAL HIGH (ref 70–99)
Glucose-Capillary: 93 mg/dL (ref 70–99)
Glucose-Capillary: 174 mg/dL — ABNORMAL HIGH (ref 70–99)
Glucose-Capillary: 180 mg/dL — ABNORMAL HIGH (ref 70–99)

## 2021-02-18 LAB — HEMOGLOBIN A1C
Hgb A1c MFr Bld: 7.7 % — ABNORMAL HIGH (ref 4.8–5.6)
Mean Plasma Glucose: 174.29 mg/dL

## 2021-02-18 LAB — URIC ACID: Uric Acid, Serum: 4.6 mg/dL (ref 3.7–8.6)

## 2021-02-18 LAB — PHOSPHORUS: Phosphorus: 2.5 mg/dL (ref 2.5–4.6)

## 2021-02-18 LAB — LIPID PANEL
Cholesterol: 80 mg/dL (ref 0–200)
HDL: 15 mg/dL — ABNORMAL LOW (ref >40)
LDL Cholesterol: 55 mg/dL (ref 0–99)
Total CHOL/HDL Ratio: 5.3 RATIO
Triglycerides: 48 mg/dL (ref <150)
VLDL: 10 mg/dL (ref 0–40)

## 2021-02-18 LAB — CREATININE, URINE, RANDOM: Creatinine, Urine: 54.23 mg/dL

## 2021-02-18 LAB — HEPARIN LEVEL (UNFRACTIONATED)
Heparin Unfractionated: 0.2 IU/mL — ABNORMAL LOW (ref 0.30–0.70)
Heparin Unfractionated: 0.21 IU/mL — ABNORMAL LOW (ref 0.30–0.70)
Heparin Unfractionated: 0.22 IU/mL — ABNORMAL LOW (ref 0.30–0.70)

## 2021-02-18 LAB — MAGNESIUM: Magnesium: 1.8 mg/dL (ref 1.7–2.4)

## 2021-02-18 LAB — NA AND K (SODIUM & POTASSIUM), RAND UR
Potassium Urine: 29 mmol/L
Sodium, Ur: 32 mmol/L

## 2021-02-18 LAB — SEDIMENTATION RATE: Sed Rate: 14 mm/hr (ref 0–16)

## 2021-02-18 LAB — FOLATE: Folate: 22 ng/mL (ref >5.9)

## 2021-02-18 LAB — CK: Total CK: 45 U/L — ABNORMAL LOW (ref 49–397)

## 2021-02-18 LAB — C-REACTIVE PROTEIN: CRP: 13.2 mg/dL — ABNORMAL HIGH (ref <1.0)

## 2021-02-18 LAB — OSMOLALITY: Osmolality: 263 mOsm/kg — ABNORMAL LOW (ref 275–295)

## 2021-02-18 LAB — OSMOLALITY, URINE: Osmolality, Ur: 436 mOsm/kg (ref 300–900)

## 2021-02-18 LAB — PREALBUMIN: Prealbumin: <5 mg/dL — ABNORMAL LOW (ref 18–38)

## 2021-02-18 LAB — VITAMIN B12: Vitamin B-12: 1338 pg/mL — ABNORMAL HIGH (ref 180–914)

## 2021-02-18 MED ORDER — ACETAMINOPHEN 325 MG PO TABS
650.0000 mg | ORAL_TABLET | Freq: Four times a day (QID) | ORAL | Status: DC | PRN
  Administered 2021-02-19 – 2021-02-28 (×2): 650 mg via ORAL
  Filled 2021-02-18 (×3): qty 2

## 2021-02-18 MED ORDER — ALBUTEROL SULFATE (2.5 MG/3ML) 0.083% IN NEBU
3.0000 mL | INHALATION_SOLUTION | Freq: Four times a day (QID) | RESPIRATORY_TRACT | Status: DC
  Administered 2021-02-18: 3 mL via RESPIRATORY_TRACT
  Filled 2021-02-18: qty 3

## 2021-02-18 MED ORDER — ACETAMINOPHEN 650 MG RE SUPP: 650.0000 mg | Freq: Four times a day (QID) | RECTAL | Status: DC | PRN

## 2021-02-18 MED ORDER — AMIODARONE HCL 200 MG PO TABS
200.0000 mg | ORAL_TABLET | Freq: Two times a day (BID) | ORAL | Status: DC
  Administered 2021-02-18 – 2021-02-22 (×9): 200 mg via ORAL
  Filled 2021-02-18 (×9): qty 1

## 2021-02-18 MED ORDER — ALBUTEROL SULFATE (2.5 MG/3ML) 0.083% IN NEBU: 3.0000 mL | INHALATION_SOLUTION | Freq: Three times a day (TID) | RESPIRATORY_TRACT | Status: DC

## 2021-02-18 MED ORDER — POLYETHYLENE GLYCOL 3350 17 G PO PACK: 17.0000 g | PACK | Freq: Every day | ORAL | Status: DC | PRN

## 2021-02-18 MED ORDER — GLUCERNA SHAKE PO LIQD
237.0000 mL | Freq: Two times a day (BID) | ORAL | Status: DC
  Administered 2021-02-18 – 2021-03-08 (×20): 237 mL via ORAL
  Filled 2021-02-18 (×2): qty 237

## 2021-02-18 MED ORDER — SODIUM CHLORIDE 0.9 % IV SOLN
3.0000 g | Freq: Four times a day (QID) | INTRAVENOUS | Status: AC
  Administered 2021-02-18 – 2021-02-21 (×12): 3 g via INTRAVENOUS
  Filled 2021-02-18: qty 8
  Filled 2021-02-18: qty 3
  Filled 2021-02-18 (×2): qty 8
  Filled 2021-02-18 (×2): qty 3
  Filled 2021-02-18: qty 8
  Filled 2021-02-18 (×3): qty 3
  Filled 2021-02-18: qty 8
  Filled 2021-02-18: qty 3
  Filled 2021-02-18: qty 8

## 2021-02-18 MED ORDER — ONDANSETRON HCL 4 MG/2ML IJ SOLN
4.0000 mg | Freq: Four times a day (QID) | INTRAMUSCULAR | Status: DC | PRN
  Administered 2021-02-23: 4 mg via INTRAVENOUS
  Filled 2021-02-18: qty 2

## 2021-02-18 MED ORDER — CHLORHEXIDINE GLUCONATE CLOTH 2 % EX PADS
6.0000 | MEDICATED_PAD | Freq: Every day | CUTANEOUS | Status: DC
  Administered 2021-02-19 – 2021-03-09 (×19): 6 via TOPICAL

## 2021-02-18 MED ORDER — THIAMINE HCL 100 MG PO TABS
100.0000 mg | ORAL_TABLET | Freq: Every day | ORAL | Status: DC
  Administered 2021-02-18 – 2021-02-19 (×2): 100 mg via ORAL
  Filled 2021-02-18 (×2): qty 1

## 2021-02-18 MED ORDER — METRONIDAZOLE IN NACL 5-0.79 MG/ML-% IV SOLN: 500.0000 mg | Freq: Three times a day (TID) | INTRAVENOUS | Status: DC

## 2021-02-18 MED ORDER — INSULIN ASPART 100 UNIT/ML ~~LOC~~ SOLN
0.0000 [IU] | Freq: Three times a day (TID) | SUBCUTANEOUS | Status: DC
  Administered 2021-02-18: 2 [IU] via SUBCUTANEOUS
  Administered 2021-02-18 – 2021-02-21 (×4): 3 [IU] via SUBCUTANEOUS
  Administered 2021-02-22: 5 [IU] via SUBCUTANEOUS
  Administered 2021-02-22 (×2): 2 [IU] via SUBCUTANEOUS
  Administered 2021-02-23 – 2021-02-25 (×5): 3 [IU] via SUBCUTANEOUS
  Administered 2021-02-25: 5 [IU] via SUBCUTANEOUS
  Administered 2021-02-25: 2 [IU] via SUBCUTANEOUS
  Administered 2021-02-26: 3 [IU] via SUBCUTANEOUS
  Administered 2021-02-26: 2 [IU] via SUBCUTANEOUS
  Administered 2021-02-27: 3 [IU] via SUBCUTANEOUS
  Administered 2021-02-27: 2 [IU] via SUBCUTANEOUS
  Administered 2021-02-27: 3 [IU] via SUBCUTANEOUS
  Administered 2021-02-28: 2 [IU] via SUBCUTANEOUS
  Administered 2021-02-28: 3 [IU] via SUBCUTANEOUS
  Administered 2021-02-28 – 2021-03-02 (×4): 2 [IU] via SUBCUTANEOUS
  Administered 2021-03-02 (×2): 3 [IU] via SUBCUTANEOUS
  Administered 2021-03-03: 2 [IU] via SUBCUTANEOUS
  Administered 2021-03-03: 3 [IU] via SUBCUTANEOUS
  Administered 2021-03-04 (×2): 2 [IU] via SUBCUTANEOUS
  Administered 2021-03-04: 3 [IU] via SUBCUTANEOUS
  Administered 2021-03-05: 2 [IU] via SUBCUTANEOUS
  Administered 2021-03-05: 3 [IU] via SUBCUTANEOUS
  Administered 2021-03-05: 2 [IU] via SUBCUTANEOUS
  Administered 2021-03-07 (×2): 5 [IU] via SUBCUTANEOUS
  Administered 2021-03-07 – 2021-03-08 (×2): 3 [IU] via SUBCUTANEOUS
  Administered 2021-03-08 (×2): 2 [IU] via SUBCUTANEOUS

## 2021-02-18 MED ORDER — ADULT MULTIVITAMIN W/MINERALS CH
1.0000 | ORAL_TABLET | Freq: Every day | ORAL | Status: DC
  Administered 2021-02-18 – 2021-03-09 (×18): 1 via ORAL
  Filled 2021-02-18 (×19): qty 1

## 2021-02-18 MED ORDER — FAMOTIDINE 20 MG PO TABS
20.0000 mg | ORAL_TABLET | Freq: Every day | ORAL | Status: DC
  Administered 2021-02-18 – 2021-03-09 (×18): 20 mg via ORAL
  Filled 2021-02-18 (×18): qty 1

## 2021-02-18 MED ORDER — INSULIN ASPART 100 UNIT/ML ~~LOC~~ SOLN: 0.0000 [IU] | Freq: Three times a day (TID) | SUBCUTANEOUS | Status: DC

## 2021-02-18 MED ORDER — INSULIN ASPART 100 UNIT/ML ~~LOC~~ SOLN
0.0000 [IU] | Freq: Every day | SUBCUTANEOUS | Status: DC
  Administered 2021-02-20: 3 [IU] via SUBCUTANEOUS
  Administered 2021-02-21: 2 [IU] via SUBCUTANEOUS
  Administered 2021-02-22: 4 [IU] via SUBCUTANEOUS
  Administered 2021-03-04: 5 [IU] via SUBCUTANEOUS

## 2021-02-18 MED ORDER — ALBUTEROL SULFATE (2.5 MG/3ML) 0.083% IN NEBU
3.0000 mL | INHALATION_SOLUTION | Freq: Four times a day (QID) | RESPIRATORY_TRACT | Status: DC | PRN
  Administered 2021-02-26: 3 mL via RESPIRATORY_TRACT
  Filled 2021-02-18: qty 3

## 2021-02-18 MED ORDER — SODIUM CHLORIDE 0.9 % IV SOLN: 2.0000 g | INTRAVENOUS | Status: DC

## 2021-02-18 MED ORDER — POTASSIUM CHLORIDE CRYS ER 20 MEQ PO TBCR
40.0000 meq | EXTENDED_RELEASE_TABLET | ORAL | Status: AC
  Administered 2021-02-18 (×2): 40 meq via ORAL
  Filled 2021-02-18 (×2): qty 2

## 2021-02-18 MED ORDER — DOCUSATE SODIUM 100 MG PO CAPS
100.0000 mg | ORAL_CAPSULE | Freq: Two times a day (BID) | ORAL | Status: DC
  Administered 2021-02-18 – 2021-03-09 (×31): 100 mg via ORAL
  Filled 2021-02-18 (×34): qty 1

## 2021-02-18 MED ORDER — ONDANSETRON HCL 4 MG PO TABS
4.0000 mg | ORAL_TABLET | Freq: Four times a day (QID) | ORAL | Status: DC | PRN
  Administered 2021-03-01: 4 mg via ORAL
  Filled 2021-02-18: qty 1

## 2021-02-18 NOTE — Progress Notes (Signed)
Patient placed on CPAP. Patient stated it was okay and he would try to wear it.

## 2021-02-18 NOTE — Progress Notes (Signed)
ANTICOAGULATION CONSULT NOTE - Follow Up Consult  Pharmacy Consult for Heparin Indication: atrial fibrillation  Allergies  Allergen Reactions  . Codeine Itching  . Bee Venom Other (See Comments)    "Feels like his head is on fire when he goes outside in the sun"  . Hydrocodone-Acetaminophen Other (See Comments)  . Other Other (See Comments)    "Feels like his head is on fire when he goes outside in the sun"  . Cephalexin     No mention of this allergy in notes from 07/2016 admission for osteomyelitis.  Patient was treated with Ancef and Unasyn for broader anaerobic coverage.  Patient is not aware of allergy.  . Vicodin [Hydrocodone-Acetaminophen] Itching  . Zocor [Simvastatin] Other (See Comments)    Myalgias    Patient Measurements: Height: 6\' 3"  (190.5 cm) Weight: 101.1 kg (222 lb 14.2 oz) IBW/kg (Calculated) : 84.5 Heparin Dosing Weight: 101 kg  Vital Signs: Temp: 97.4 F (36.3 C) (04/25 1648) Temp Source: Oral (04/25 1648) BP: 113/84 (04/25 1648) Pulse Rate: 82 (04/25 1648)  Labs: Recent Labs    02/16/21 0622 02/16/21 0907 02/17/21 0508 02/17/21 1604 02/18/21 0121 02/18/21 0940 02/18/21 1837  HGB 12.7*  --  12.6*  --  13.2  --   --   HCT 38.7*  --  37.7*  --  39.1  --   --   PLT 222  --  210  --  199  --   --   HEPARINUNFRC 0.43  --  0.36  --  0.20* 0.21* 0.22*  CREATININE  --    < > 1.14 1.05 1.06 1.06  --   CKTOTAL  --   --   --   --   --  45*  --    < > = values in this interval not displayed.    Estimated Creatinine Clearance: 89.7 mL/min (by C-G formula based on SCr of 1.06 mg/dL).  Assessment: 59 y.o.male with medical history of DM, HTN, strokes, and A. Fib on rate control and systemic AC who presented 02/14/21 as a transfer from [facility] ED for evaluation of limb ischemic and septic shock. Per medical record had been on Xarelto, unclear when patient had his last dose. Patient started on Heparin infusion at [facility]. Pharmacy consulted to  follow heparin drip.    Heparin levels were therapeutic on 4/23 and 4/24 on 1900 units/hr, then fell to subtherapeutic(0.20)  And remain low at 0.22  despite increased heparin infusion rate to 2300 uts/h.  No infusion problems reported.    Initial concern for RLE arterial occlusion, but per Vascular, unremarkable non-invasive studies. No vascular intervention planned. Amputation 4/27 and then will resume oral anticoagulation  Goal of Therapy:  Heparin level 0.3-0.7 units/ml Monitor platelets by anticoagulation protocol: Yes   Plan:  Increase heparin drip to 2500 units/hr Daily heparin level and CBC   5/27 Pharm.D. CPP, BCPS Clinical Pharmacist [phone] 02/18/2021 7:25 PM

## 2021-02-18 NOTE — Progress Notes (Signed)
Pharmacy Antibiotic Note  Fernando Maddox is a 60 y.o. male admitted on 02/14/2021 with wound infection.  Pharmacy has been consulted for Unasyn dosing.  Plan: Unasyn 3g IV q 6 hrs. F/u renal functions, clinical course.  Height: 6\' 3"  (190.5 cm) Weight: 101.1 kg (222 lb 14.2 oz) IBW/kg (Calculated) : 84.5  Temp (24hrs), Avg:98.1 F (36.7 C), Min:97.3 F (36.3 C), Max:98.8 F (37.1 C)  Recent Labs  Lab 02/14/21 2127 02/15/21 0250 02/15/21 0531 02/16/21 0622 02/16/21 0907 02/17/21 0508 02/17/21 1604 02/18/21 0121 02/18/21 0940  WBC 15.3* 16.1*  --  11.4*  --  11.7*  --  12.4*  --   CREATININE 0.99 1.01   < >  --  1.24 1.14 1.05 1.06 1.06  LATICACIDVEN 1.6 1.6  --   --   --   --   --   --   --    < > = values in this interval not displayed.    Estimated Creatinine Clearance: 89.7 mL/min (by C-G formula based on SCr of 1.06 mg/dL).    Allergies  Allergen Reactions  . Codeine Itching  . Bee Venom Other (See Comments)    "Feels like his head is on fire when he goes outside in the sun"  . Hydrocodone-Acetaminophen Other (See Comments)  . Other Other (See Comments)    "Feels like his head is on fire when he goes outside in the sun"  . Cephalexin     No mention of this allergy in notes from 07/2016 admission for osteomyelitis.  Patient was treated with Ancef and Unasyn for broader anaerobic coverage.  Patient is not aware of allergy.  . Vicodin [Hydrocodone-Acetaminophen] Itching  . Zocor [Simvastatin] Other (See Comments)    Myalgias    Antimicrobials this admission: Vanc 4/22 >> 4/25 Zosyn 4/22 >> CHG/Bactroban 4/22>>(4/26)  Dose adjustments this admission:  Microbiology results: 4/21 MRSA PCR: positive for Staph, negative for MRSA  Thank you for allowing pharmacy to be a part of this patient's care.  5/21, Reece Leader, BCCP Clinical Pharmacist  02/18/2021 4:48 PM   [facility] pharmacy phone numbers are listed on [email or web address]

## 2021-02-18 NOTE — Progress Notes (Addendum)
Initial Nutrition Assessment  DOCUMENTATION CODES:   Not applicable  INTERVENTION:    Glucerna Shake po BID, each supplement provides 220 kcal and 10 grams of protein.  Magic cup TID with meals, each supplement provides 290 kcal and 9 grams of protein.  MVI with minerals daily.  NUTRITION DIAGNOSIS:   Moderate Malnutrition related to chronic illness (CHF) as evidenced by mild muscle depletion,moderate muscle depletion,mild fat depletion,moderate fat depletion.  GOAL:   Patient will meet greater than or equal to 90% of their needs  MONITOR:   PO intake,Supplement acceptance,Labs,Skin  REASON FOR ASSESSMENT:   Consult Wound healing  ASSESSMENT:   60 yo male admitted with unresponsiveness, gangrene RLE, septic shock. PMH includes DM-2, HTN, CVA, A fib, CHF, CAD, substance abuse.  Plans for RLE TMA this week.   Currently on a heart healthy carbohydrate modified diet with 1200 ml fluid restriction. Meal intakes: 5-75%  Patient c/o decreased appetite since admission. He says he has lost weight, but unable to quantify. He wants some ice cream. Lunch tray at bedside; patient ate all the meatloaf and macaroni & cheese; he did not eat the grapes or broccoli. Encouraged adequate protein intake to support wound healing. He agreed to receive magic cup supplements with meals and Glucerna Shake supplements between meals to maximize protein intake.   Labs reviewed. Na 123, K 3.2, A1C 7.7 CBG: 147-93  Medications reviewed and include Novolog SSI.  Weight history reviewed; no significant weight changes noted.   NUTRITION - FOCUSED PHYSICAL EXAM:  Flowsheet Row Most Recent Value  Orbital Region Mild depletion  Upper Arm Region Moderate depletion  Thoracic and Lumbar Region Mild depletion  Buccal Region Mild depletion  Temple Region Mild depletion  Clavicle Bone Region Moderate depletion  Clavicle and Acromion Bone Region Mild depletion  Scapular Bone Region Mild depletion   Dorsal Hand Mild depletion  Patellar Region Mild depletion  Anterior Thigh Region Mild depletion  Posterior Calf Region No depletion  Edema (RD Assessment) Mild  Hair Reviewed  Eyes Reviewed  Mouth Reviewed  Skin Reviewed  Nails Reviewed       Diet Order:   Diet Order            Diet heart healthy/carb modified Room service appropriate? Yes; Fluid consistency: Thin; Fluid restriction: 1200 mL Fluid  Diet effective now                 EDUCATION NEEDS:   Education needs have been addressed  Skin:  Skin Assessment: Skin Integrity Issues: Skin Integrity Issues:: Other (Comment),DTI DTI: sacrum Other: non pressure wound L foot   Last BM:  4/24  Height:   Ht Readings from Last 1 Encounters:  02/18/21 6\' 3"  (1.905 m)    Weight:   Wt Readings from Last 1 Encounters:  02/17/21 101.1 kg    Ideal Body Weight:  89.1 kg  BMI:  Body mass index is 27.86 kg/m.  Estimated Nutritional Needs:   Kcal:  2500-2700  Protein:  140-160 gm  Fluid:  >/= 2.5 L    02/19/21, RD, LDN, CNSC Please refer to Amion for contact information.

## 2021-02-18 NOTE — Progress Notes (Signed)
ANTICOAGULATION CONSULT NOTE - Follow Up Consult  Pharmacy Consult for Heparin Indication: atrial fibrillation  Allergies  Allergen Reactions  . Codeine Itching  . Bee Venom Other (See Comments)    "Feels like his head is on fire when he goes outside in the sun"  . Hydrocodone-Acetaminophen Other (See Comments)  . Other Other (See Comments)    "Feels like his head is on fire when he goes outside in the sun"  . Cephalexin     No mention of this allergy in notes from 07/2016 admission for osteomyelitis.  Patient was treated with Ancef and Unasyn for broader anaerobic coverage.  Patient is not aware of allergy.  . Vicodin [Hydrocodone-Acetaminophen] Itching  . Zocor [Simvastatin] Other (See Comments)    Myalgias    Patient Measurements: Height: 6\' 3"  (190.5 cm) Weight: 101.1 kg (222 lb 14.2 oz) IBW/kg (Calculated) : 84.5 Heparin Dosing Weight: 101 kg  Vital Signs: Temp: 97.3 F (36.3 C) (04/25 0800) Temp Source: Oral (04/25 0800) BP: 106/65 (04/25 0800) Pulse Rate: 84 (04/25 0800)  Labs: Recent Labs    02/16/21 0622 02/16/21 0907 02/17/21 0508 02/17/21 1604 02/18/21 0121 02/18/21 0940  HGB 12.7*  --  12.6*  --  13.2  --   HCT 38.7*  --  37.7*  --  39.1  --   PLT 222  --  210  --  199  --   HEPARINUNFRC 0.43  --  0.36  --  0.20* 0.21*  CREATININE  --    < > 1.14 1.05 1.06 1.06  CKTOTAL  --   --   --   --   --  45*   < > = values in this interval not displayed.    Estimated Creatinine Clearance: 89.7 mL/min (by C-G formula based on SCr of 1.06 mg/dL).  Assessment: 59 y.o.male with medical history of DM, HTN, strokes, and A. Fib on rate control and systemic AC who presented 02/14/21 as a transfer from [facility] ED for evaluation of limb ischemic and septic shock. Per medical record had been on Xarelto, unclear when patient had his last dose. Patient started on Heparin infusion at [facility]. Pharmacy consulted to follow heparin drip.    Heparin levels were  therapeutic on 4/23 and 4/24 on 1900 units/hr, then subtherapeutic(0.20) this am (0.20) and infusion rate increased to 2100 units/hr. Level remains subtherapeutic (0.21).  No infusion problems reported.    Initial concern for RLE arterial occlusion, but per Vascular, unremarkable non-invasive studies. No vascular intervention planned.  Goal of Therapy:  Heparin level 0.3-0.7 units/ml Monitor platelets by anticoagulation protocol: Yes   Plan:  Increase heparin drip to 2300 units/hr Heparin level ~6 hrs after increase Daily heparin level and CBC Noted plan for L transmetatarsal amputation on 4/27.  5/27, Dennie Fetters 02/18/2021,12:09 PM

## 2021-02-18 NOTE — Progress Notes (Signed)
Patient ID: Fernando Maddox, male   DOB: [birth year], 60 y.o.   MRN: [id number] Surgical time is not available for tomorrow.  Plan for a left transmetatarsal amputation Wednesday morning.

## 2021-02-18 NOTE — Progress Notes (Signed)
ANTICOAGULATION CONSULT NOTE  Pharmacy Consult for Heparin Indication: atrial fibrillation with possible LE embolus  Allergies  Allergen Reactions  . Codeine Itching  . Bee Venom Other (See Comments)    "Feels like his head is on fire when he goes outside in the sun"  . Hydrocodone-Acetaminophen Other (See Comments)  . Other Other (See Comments)    "Feels like his head is on fire when he goes outside in the sun"  . Cephalexin   . Vicodin [Hydrocodone-Acetaminophen] Itching  . Zocor [Simvastatin] Other (See Comments)    Myalgias    Patient Measurements: Weight: 101.1 kg (222 lb 14.2 oz)  Vital Signs: Temp: 98.2 F (36.8 C) (04/24 2336) Temp Source: Oral (04/24 2336) BP: 105/89 (04/24 2336) Pulse Rate: 48 (04/24 2336)  Labs: Recent Labs    02/16/21 0622 02/16/21 0907 02/17/21 0508 02/17/21 1604 02/18/21 0121  HGB 12.7*  --  12.6*  --  13.2  HCT 38.7*  --  37.7*  --  39.1  PLT 222  --  210  --  199  HEPARINUNFRC 0.43  --  0.36  --  0.20*  CREATININE  --    < > 1.14 1.05 1.06   < > = values in this interval not displayed.    Estimated Creatinine Clearance: 89.7 mL/min (by C-G formula based on SCr of 1.06 mg/dL).  Assessment: 60 y.o. male with h/o Afib, Xarelto on hold, for heparin  Goal of Therapy:  Heparin level 0.3-0.7 units/ml  Monitor platelets by anticoagulation protocol: Yes   Plan:  Increase Heparin 2100 units/hr Check heparin level in 8 hours.   Geannie Risen, PharmD, BCPS

## 2021-02-18 NOTE — Progress Notes (Signed)
[facility] Progress Note  Patient: Fernando Maddox    ION:[id number]  DOA: 02/14/2021     Date of Service: the patient was seen and examined on 02/18/2021  Brief hospital course: Past medical history of type II DM, HTN, CVA, A. fib, chronic systolic CHF, CAD, substance abuse.  Patient presents with complaints of unresponsiveness with gangrene of bilateral lower extremity and septic shock. 4/21 admitted to [facility] from [facility] ED, central line placed, heparin drip started and vascular surgery consulted.  CT angio shows acute right arterial occlusion but Doppler negative for any obstruction 4/23 orthopedic consulted for amputation. 4/24 cardiology consulted for heart failure management.  Currently plan is monitor orthopedic and vascular surgery recommendation regarding gangrene and infection, monitor cardiology recommendation regarding volume management, monitor sodium.  Assessment and Plan: 1.  Critical limb ischemia PAD Diabetic foot with gangrene Septic shock POA Presents with unresponsiveness, found to have gangrene-like features of the right leg. Also has left leg edema CT scan concerning for arterial occlusion of the right mid superficial femoral artery. ABI shows index within normal range bilaterally. Vascular surgery recommends no intervention for now. Currently on therapeutic heparin drip. Orthopedic consulted, currently scheduled for left TMA on 4/27.  Optimized medically before surgery. Currently on IV vancomycin and Zosyn. No blood culture done on admission.  MRSA PCR negative. Changed to Unasyn.  2.  Septic shock POA secondary to gangrene of extremity Also possible hypovolemic shock. Unresponsive event at home Acute metabolic encephalopathy Presented with unresponsiveness.  Possibly down for 3 days at home. At [facility] patient was started on Levophed.  Currently discontinued. On IV vancomycin and Zosyn. UA negative. Blood cultures not available here. Not a  good candidate to initiate midodrine. Mentation appears to be improving but still does not appear to be back to baseline. No focal deficit therefore no further work-up. b12 1338, tsh, ammonia normal. Folate normal. No focal deficit. No further work up. Add b1 replacement.  3.  CAD Acute on chronic combined CHF. ?  Cor pulmonale Echocardiogram shows diastolic dysfunction. Prior EF of 35%.  Recent EF is 20%. Also shows RV failure. Cardiology consulted. Appreciate assistance. Lasix as needed.  Currently appear euvolemic per cardiology. Need to add ACE inhibitor/ARNI/ARB when blood pressure is better. Monitor on telemetry.   4.  A. fib with RVR Known prior history. Currently on IV amiodarone.  Switch to p.o. 200 mg twice daily for 1 week and 20 mg thereafter. Management per cardiology. On IV heparin drip as well. Transition to oral when surgery is completed. Monitor on telemetry progressive care unit.  5.  Hyponatremia, likely multifactorial. Hypoosmolar.  Patient also drinking water excessively. Currently will initiate fluid restriction and monitor.  6.  Hypokalemia, hypomagnesemia, hypophosphatemia Currently replacing.  7.  Insomnia. Ambien as needed nightly.  8.  History of substance abuse. No UDS performed. UDS 3 years ago was only positive for cannabis.  9.  OSA. Likely undiagnosed. Continue CPAP per pulmonary. Monitor.  10.  Moderate protein calorie malnutrition. Continue dietary supplementation. Appreciate dietary consult.  Body mass index is 27.86 kg/m.  Nutrition Problem: Moderate Malnutrition Etiology: chronic illness (CHF) Interventions: Interventions: MVI,Glucerna shake,Magic cup  Pressure Injury 02/14/21 Sacrum Medial;Upper Deep Tissue Pressure Injury - Purple or maroon localized area of discolored intact skin or blood-filled blister due to damage of underlying soft tissue from pressure and/or shear. (Active)  02/14/21 2030  Location: Sacrum   Location Orientation: Medial;Upper  Staging: Deep Tissue Pressure Injury - Purple or maroon localized  area of discolored intact skin or blood-filled blister due to damage of underlying soft tissue from pressure and/or shear.  Wound Description (Comments):   Present on Admission: Yes     Diet: Cardiac diet DVT Prophylaxis: Therapeutic heparin     Advance goals of care discussion: Full code  Family Communication: no family was present at bedside, at the time of interview.   Disposition:  Status is: Inpatient  Remains inpatient appropriate because:IV treatments appropriate due to intensity of illness or inability to take PO   Dispo: The patient is from: Home              Anticipated d/c is to: Home              Patient currently is not medically stable to d/c.   Difficult to place patient   Subjective: No nausea or vomiting.  No fever no chills.  Continues to report shortness of breath.  No pain in the leg.  Physical Exam:  General: Appear in mild distress, no Rash; Oral Mucosa Clear, moist. no Abnormal Neck Mass Or lumps, Conjunctiva normal  Cardiovascular: S1 and S2 Present, no Murmur, Respiratory: good respiratory effort, Bilateral Air entry present and CTA, no Crackles, no wheezes Abdomen: Bowel Sound present, Soft and no tenderness Extremities: Bilateral pedal edema with evidence of cellulitis Neurology: alert and oriented to time, place, and person affect anxious. no new focal deficit Gait not checked due to patient safety concerns    Vitals:   02/18/21 0800 02/18/21 1200 02/18/21 1235 02/18/21 1236  BP: 106/65  103/81 103/81  Pulse: 84  95 94  Resp: 20  20 20   Temp: (!) 97.3 F (36.3 C)   98.8 F (37.1 C)  TempSrc: Oral   Rectal  SpO2: 99%  90% 100%  Weight:      Height:  6\' 3"  (1.905 m)      Intake/Output Summary (Last 24 hours) at 02/18/2021 1613 Last data filed at 02/18/2021 1423 Gross per 24 hour  Intake 2051.84 ml  Output 2376 ml  Net -324.16 ml    Filed Weights   02/15/21 0500 02/16/21 0500 02/17/21 0500  Weight: 96.4 kg 99.8 kg 101.1 kg    Data Reviewed: I have personally reviewed and interpreted daily labs, tele strips, imaging. I reviewed all nursing notes, pharmacy notes, vitals, pertinent old records I have discussed plan of care as described above with RN and patient/family.  CBC: Recent Labs  Lab 02/14/21 2127 02/15/21 0250 02/16/21 0622 02/17/21 0508 02/18/21 0121  WBC 15.3* 16.1* 11.4* 11.7* 12.4*  HGB 12.5* 12.6* 12.7* 12.6* 13.2  HCT 37.5* 38.5* 38.7* 37.7* 39.1  MCV 83.9 84.6 86.2 84.0 82.1  PLT 216 226 222 210 199   Basic Metabolic Panel: Recent Labs  Lab 02/14/21 2127 02/15/21 0250 02/16/21 0622 02/16/21 0907 02/17/21 0508 02/17/21 1604 02/18/21 0121 02/18/21 0940  NA 126*   < >  --  123* 122* 123* 123* 123*  K 2.9*   < >  --  3.9 3.7 3.2* 3.4* 3.2*  CL 94*   < >  --  91* 90* 89* 90* 88*  CO2 24   < >  --  20* 22 23 20* 25  GLUCOSE 167*   < >  --  82 110* 157* 102* 126*  BUN 12   < >  --  18 18 19 17 17   CREATININE 0.99   < >  --  1.24 1.14 1.05 1.06 1.06  CALCIUM  7.9*   < >  --  7.9* 8.0* 7.8* 7.9* 7.7*  MG 1.8  --  1.8  --  1.8 1.5* 1.8  --   PHOS 2.8  --   --   --  2.1*  --  2.5  --    < > = values in this interval not displayed.    Studies: No results found.  Scheduled Meds: . amiodarone  200 mg Oral BID  . [START ON 02/19/2021] Chlorhexidine Gluconate Cloth  6 each Topical Daily  . docusate sodium  100 mg Oral BID  . feeding supplement (GLUCERNA SHAKE)  237 mL Oral BID BM  . insulin aspart  0-15 Units Subcutaneous TID WC  . insulin aspart  0-5 Units Subcutaneous QHS  . mouth rinse  15 mL Mouth Rinse BID  . multivitamin with minerals  1 tablet Oral Daily  . mupirocin ointment  1 application Nasal BID  . potassium chloride  40 mEq Oral Q2H   Continuous Infusions: . heparin 2,300 Units/hr (02/18/21 1219)   PRN Meds: acetaminophen **OR** acetaminophen, ondansetron **OR**  ondansetron (ZOFRAN) IV, polyethylene glycol, zolpidem  Time spent: The patient is critically ill with multiple organ systems failure and requires high complexity decision making for assessment and support, frequent evaluation and titration of therapies. Critical Care Time devoted to patient care services described in this note is 35 minutes   Author: Lynden Oxford, MD [facility] 02/18/2021 4:13 PM  To reach On-call, see care teams to locate the attending and reach out via [email or web address]. Between 7PM-7AM, please contact night-coverage If you still have difficulty reaching the attending provider, please page the [facility] (Director on Call) for [facility] on amion for assistance.

## 2021-02-18 NOTE — Progress Notes (Addendum)
  Progress Note    02/18/2021 7:47 AM Hospital Day 4  Subjective:  Says he wants some water, otherwise, no complaints.  afebrile  Vitals:   02/17/21 2336 02/18/21 0410  BP: 105/89 101/81  Pulse: (!) 48 68  Resp: 18 16  Temp: 98.2 F (36.8 C) 98.1 F (36.7 C)  SpO2:  92%    Physical Exam: General:  No distress Lungs:  Non labored Extremities:  Faintly palpable right DP pulse; left foot with dressing in place.   CBC    Component Value Date/Time   WBC 12.4 (H) 02/18/2021 0121   RBC 4.76 02/18/2021 0121   HGB 13.2 02/18/2021 0121   HCT 39.1 02/18/2021 0121   PLT 199 02/18/2021 0121   MCV 82.1 02/18/2021 0121   MCH 27.7 02/18/2021 0121   MCHC 33.8 02/18/2021 0121   RDW 16.3 (H) 02/18/2021 0121    BMET    Component Value Date/Time   NA 123 (L) 02/18/2021 0121   K 3.4 (L) 02/18/2021 0121   CL 90 (L) 02/18/2021 0121   CO2 20 (L) 02/18/2021 0121   GLUCOSE 102 (H) 02/18/2021 0121   BUN 17 02/18/2021 0121   CREATININE 1.06 02/18/2021 0121   CALCIUM 7.9 (L) 02/18/2021 0121   GFRNONAA >60 02/18/2021 0121   GFRAA  04/03/2007 0425    >60        The eGFR has been calculated using the MDRD equation. This calculation has not been validated in all clinical    INR    Component Value Date/Time   INR 1.5 (H) 02/14/2021 2127     Intake/Output Summary (Last 24 hours) at 02/18/2021 0747 Last data filed at 02/18/2021 0515 Gross per 24 hour  Intake 2181.07 ml  Output 3761 ml  Net -1579.93 ml     Assessment/Plan:  60 y.o. male found down x 3 days with left forefoot ulceration and gangrene. Concern for RLE arterial occlusion initially, unremarkable non-invasive vascular studies yesterday. Orthopedics consulted and planning left TMA this week  Hospital Day 4  -plan for left TMA this week with ortho.  No indication for vascular intervention.  Will be available as needed.    Leontine Locket, PA-C [facility] [phone] 02/18/2021 7:47 AM  I  agree with the above.  Please call if we can be of further assistance.  Will sign off  Wells [name] Bernasconi

## 2021-02-18 NOTE — Progress Notes (Signed)
Progress Note  Patient Name: Fernando Maddox Date of Encounter: 02/18/2021  [facility] Cardiologist: Garwin Brothers, MD   Subjective   Seems to be fairly comfortable.  No shortness of breath, no chest pain  Inpatient Medications    Scheduled Meds: . amiodarone  200 mg Oral BID  . [START ON 02/19/2021] Chlorhexidine Gluconate Cloth  6 each Topical Daily  . docusate sodium  100 mg Oral BID  . feeding supplement (GLUCERNA SHAKE)  237 mL Oral BID BM  . insulin aspart  0-15 Units Subcutaneous TID WC  . insulin aspart  0-5 Units Subcutaneous QHS  . mouth rinse  15 mL Mouth Rinse BID  . multivitamin with minerals  1 tablet Oral Daily  . mupirocin ointment  1 application Nasal BID   Continuous Infusions: . heparin 2,300 Units/hr (02/18/21 1219)   PRN Meds: acetaminophen **OR** acetaminophen, ondansetron **OR** ondansetron (ZOFRAN) IV, polyethylene glycol, zolpidem   Vital Signs    Vitals:   02/18/21 0800 02/18/21 1200 02/18/21 1235 02/18/21 1236  BP: 106/65  103/81 103/81  Pulse: 84  95 94  Resp: 20  20 20   Temp: (!) 97.3 F (36.3 C)   98.8 F (37.1 C)  TempSrc: Oral   Rectal  SpO2: 99%  90% 100%  Weight:      Height:  6\' 3"  (1.905 m)      Intake/Output Summary (Last 24 hours) at 02/18/2021 1355 Last data filed at 02/18/2021 0830 Gross per 24 hour  Intake 941.36 ml  Output 3526 ml  Net -2584.64 ml   Last 3 Weights 02/17/2021 02/16/2021 02/15/2021  Weight (lbs) 222 lb 14.2 oz 220 lb 0.3 oz 212 lb 8.4 oz  Weight (kg) 101.1 kg 99.8 kg 96.4 kg      Telemetry    Currently well rate controlled atrial fibrillation- Personally Reviewed  ECG    No new- Personally Reviewed  Physical Exam   GEN: No acute distress.   Neck: No JVD Cardiac:  Irregularly irregular, no murmurs, rubs, or gallops.  Respiratory: Clear to auscultation bilaterally. GI: Soft, nontender, non-distended  MS: No edema; No deformity. Neuro:  Nonfocal  Psych: Normal affect   Labs    High  Sensitivity Troponin:  No results for input(s): TROPONINIHS in the last 720 hours.    Chemistry Recent Labs  Lab 02/14/21 2127 02/15/21 0250 02/17/21 1604 02/18/21 0121 02/18/21 0940  NA 126*   < > 123* 123* 123*  K 2.9*   < > 3.2* 3.4* 3.2*  CL 94*   < > 89* 90* 88*  CO2 24   < > 23 20* 25  GLUCOSE 167*   < > 157* 102* 126*  BUN 12   < > 19 17 17   CREATININE 0.99   < > 1.05 1.06 1.06  CALCIUM 7.9*   < > 7.8* 7.9* 7.7*  PROT 5.2*  --   --   --   --   ALBUMIN 2.6*  --   --   --   --   AST 20  --   --   --   --   ALT 23  --   --   --   --   ALKPHOS 72  --   --   --   --   BILITOT 1.8*  --   --   --   --   GFRNONAA >60   < > >60 >60 >60  ANIONGAP 8   < >  11 13 10    < > = values in this interval not displayed.     Hematology Recent Labs  Lab 02/16/21 0622 02/17/21 0508 02/18/21 0121  WBC 11.4* 11.7* 12.4*  RBC 4.49 4.49 4.76  HGB 12.7* 12.6* 13.2  HCT 38.7* 37.7* 39.1  MCV 86.2 84.0 82.1  MCH 28.3 28.1 27.7  MCHC 32.8 33.4 33.8  RDW 16.7* 16.4* 16.3*  PLT 222 210 199    BNPNo results for input(s): BNP, PROBNP in the last 168 hours.   DDimer No results for input(s): DDIMER in the last 168 hours.   Radiology    No results found.  Cardiac Studies   Echo this admission EF 20% with severely reduced RV function as well and moderate MR with dilated atria  Patient Profile     60 y.o. male with chronic systolic heart failure, biventricular, permanent atrial fibrillation, tobacco use admitted with sepsis.  Assessment & Plan    Permanent atrial fibrillation - Has been on IV amiodarone for approximately 5 days.  Discussed with Dr. 46.  We will go ahead and transition him over to p.o. amiodarone 200 twice daily for the next week and then 200 mg daily thereafter.  Chronic systolic heart failure - EF 20%, moderate functional MR. -Continue with current medications.  Currently euvolemic.  If able to in the future, add goal-directed medical  therapy.  Sepsis -Planned left transmetatarsal amputation Wednesday morning, Dr. Wednesday. -Currently on IV heparin.  Resume Xarelto when able from a surgical perspective.  Polysubstance abuse - Amphetamines and THC noted in urine.  For questions or updates, please contact [facility] Please consult [email or web address] for contact info under        Signed, Lajoyce Corners, MD  02/18/2021, 1:55 PM

## 2021-02-19 DIAGNOSIS — I5021 Acute systolic (congestive) heart failure: Secondary | ICD-10-CM | POA: Diagnosis not present

## 2021-02-19 DIAGNOSIS — L97524 Non-pressure chronic ulcer of other part of left foot with necrosis of bone: Secondary | ICD-10-CM | POA: Diagnosis not present

## 2021-02-19 LAB — BASIC METABOLIC PANEL
Anion gap: 13 (ref 5–15)
Anion gap: 12 (ref 5–15)
BUN: 17 mg/dL (ref 6–20)
BUN: 16 mg/dL (ref 6–20)
CO2: 20 mmol/L — ABNORMAL LOW (ref 22–32)
CO2: 20 mmol/L — ABNORMAL LOW (ref 22–32)
Calcium: 8 mg/dL — ABNORMAL LOW (ref 8.9–10.3)
Calcium: 8.3 mg/dL — ABNORMAL LOW (ref 8.9–10.3)
Chloride: 90 mmol/L — ABNORMAL LOW (ref 98–111)
Chloride: 92 mmol/L — ABNORMAL LOW (ref 98–111)
Creatinine, Ser: 1.19 mg/dL (ref 0.61–1.24)
Creatinine, Ser: 1.03 mg/dL (ref 0.61–1.24)
GFR, Estimated: >60 mL/min (ref >60)
GFR, Estimated: >60 mL/min (ref >60)
Glucose, Bld: 127 mg/dL — ABNORMAL HIGH (ref 70–99)
Glucose, Bld: 155 mg/dL — ABNORMAL HIGH (ref 70–99)
Potassium: 4.3 mmol/L (ref 3.5–5.1)
Potassium: 4.4 mmol/L (ref 3.5–5.1)
Sodium: 123 mmol/L — ABNORMAL LOW (ref 135–145)
Sodium: 124 mmol/L — ABNORMAL LOW (ref 135–145)

## 2021-02-19 LAB — HEPARIN LEVEL (UNFRACTIONATED)
Heparin Unfractionated: 0.23 IU/mL — ABNORMAL LOW (ref 0.30–0.70)
Heparin Unfractionated: 0.23 IU/mL — ABNORMAL LOW (ref 0.30–0.70)
Heparin Unfractionated: 0.19 IU/mL — ABNORMAL LOW (ref 0.30–0.70)

## 2021-02-19 LAB — CBC
HCT: 37.8 % — ABNORMAL LOW (ref 39.0–52.0)
Hemoglobin: 12.8 g/dL — ABNORMAL LOW (ref 13.0–17.0)
MCH: 27.8 pg (ref 26.0–34.0)
MCHC: 33.9 g/dL (ref 30.0–36.0)
MCV: 82.2 fL (ref 80.0–100.0)
Platelets: 183 10*3/uL (ref 150–400)
RBC: 4.6 MIL/uL (ref 4.22–5.81)
RDW: 16.6 % — ABNORMAL HIGH (ref 11.5–15.5)
WBC: 13.4 10*3/uL — ABNORMAL HIGH (ref 4.0–10.5)
nRBC: 0 % (ref 0.0–0.2)

## 2021-02-19 LAB — GLUCOSE, CAPILLARY
Glucose-Capillary: 120 mg/dL — ABNORMAL HIGH (ref 70–99)
Glucose-Capillary: 153 mg/dL — ABNORMAL HIGH (ref 70–99)
Glucose-Capillary: 190 mg/dL — ABNORMAL HIGH (ref 70–99)
Glucose-Capillary: 142 mg/dL — ABNORMAL HIGH (ref 70–99)

## 2021-02-19 LAB — MAGNESIUM: Magnesium: 1.7 mg/dL (ref 1.7–2.4)

## 2021-02-19 MED ORDER — DIPHENHYDRAMINE HCL 25 MG PO CAPS
25.0000 mg | ORAL_CAPSULE | Freq: Once | ORAL | Status: AC
  Administered 2021-02-19: 25 mg via ORAL
  Filled 2021-02-19: qty 1

## 2021-02-19 NOTE — TOC Initial Note (Signed)
Transition of Care [facility]) - Initial/Assessment Note    Patient Details  Name: Fernando Maddox MRN: [id number] Date of Birth: 1961-04-10  Transition of Care [facility]) CM/SW Contact:    Lawerance Sabal, RN Phone Number: 02/19/2021, 4:05 PM  Clinical Narrative:      Spoke w patient at bedside, he was very moany, offered to come back but he stated "there was no time like the present" to discuss discharge care plan. He states that he lives at home with his father and his brother. He confirms that he is to have transmetatarsal amputation tomorrow. We discussed that PT would work with him post op to assess his ambulation and function. He states that at this time he would not want to go to SNF, and would want to go home. Will await arranging HH services until patient is closer to discharge and level of function can be assessed.  He states he has not had home health services before, and currently does not have or use any DME at home. TOC will continue to follow             Expected Discharge Plan: Home w Home Health Services Barriers to Discharge: Continued Medical Work up   Patient Goals and CMS Choice Patient states their goals for this hospitalization and ongoing recovery are:: to go home      Expected Discharge Plan and Services Expected Discharge Plan: Home w Home Health Services   Discharge Planning Services: CM Consult                                          Prior Living Arrangements/Services   Lives with:: Relatives                   Activities of Daily Living      Permission Sought/Granted                  Emotional Assessment              Admission diagnosis:  Septic shock (HCC) [A41.9, R65.21] Patient Active Problem List   Diagnosis Date Noted  . Ulcer of left foot with necrosis of bone (HCC)   . Septic shock (HCC) 02/14/2021  . Atrial fibrillation (HCC)   . Cerebrovascular disease   . Chronic pain syndrome   . Hyperlipidemia   . Shoulder  pain   . Anemia   . GERD (gastroesophageal reflux disease)   . Hypercholesteremia   . Hypogonadism male   . Rotator cuff syndrome   . Sleep apnea   . Chronic anticoagulation 03/25/2018  . Chronic systolic congestive heart failure (HCC) 03/25/2018  . Osteomyelitis, unspecified (HCC) 10/06/2017  . Diabetic ulcer of toe of right foot associated with type 2 diabetes mellitus (HCC) 10/01/2017  . Cellulitis 09/29/2017  . History of CVA (cerebrovascular accident) 09/29/2017  . Polysubstance abuse (HCC) 09/29/2017  . 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 Community acquired pneumonia 08/11/2016  . Tobacco use 08/11/2016  . Acute combined systolic and diastolic CHF, NYHA class 1 (HCC) 08/07/2016  . 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 History of drug abuse (HCC) 08/05/2016  . Osteoarthritis 08/05/2016  . PAF (paroxysmal atrial fibrillation) (HCC) 08/05/2016  . Stroke (HCC) 08/05/2016  . Diabetes mellitus, type 2 (HCC) 02/11/2010  . Hyperlipemia 02/11/2010  . HYPERTENSION, UNSPECIFIED 02/11/2010  . CAD (  coronary artery disease) 02/11/2010  . Hypertension 02/11/2010  . ESOPHAGEAL STRICTURE 02/11/2010  . BACK PAIN 02/11/2010  . HEADACHE 02/11/2010  . CHEST PAIN 02/11/2010  . DEPRESSION/ANXIETY 02/11/2010   PCP:  Crist Fat, MD Pharmacy:   [facility] [address] [place] [address] Phone: [phone] Fax: [phone]     Social Determinants of Health (SDOH) Interventions    Readmission Risk Interventions No flowsheet data found.

## 2021-02-19 NOTE — Progress Notes (Signed)
ANTICOAGULATION CONSULT NOTE - Follow Up Consult  Pharmacy Consult for Heparin Indication: atrial fibrillation  Allergies  Allergen Reactions  . Codeine Itching  . Bee Venom Other (See Comments)    "Feels like his head is on fire when he goes outside in the sun"  . Hydrocodone-Acetaminophen Other (See Comments)  . Other Other (See Comments)    "Feels like his head is on fire when he goes outside in the sun"  . Cephalexin     No mention of this allergy in notes from 07/2016 admission for osteomyelitis.  Patient was treated with Ancef and Unasyn for broader anaerobic coverage.  Patient is not aware of allergy.  . Vicodin [Hydrocodone-Acetaminophen] Itching  . Zocor [Simvastatin] Other (See Comments)    Myalgias    Patient Measurements: Height: 6\' 3"  (190.5 cm) Weight: 101.1 kg (222 lb 14.2 oz) IBW/kg (Calculated) : 84.5 Heparin Dosing Weight: 101 kg  Vital Signs: Temp: 98 F (36.7 C) (04/26 2000) Temp Source: Oral (04/26 2000) BP: 120/85 (04/26 2000) Pulse Rate: 87 (04/26 2000)  Labs: Recent Labs    02/17/21 0508 02/17/21 1604 02/18/21 0121 02/18/21 0940 02/18/21 1837 02/19/21 0511 02/19/21 1414 02/19/21 1629 02/19/21 2229  HGB 12.6*  --  13.2  --   --  12.8*  --   --   --   HCT 37.7*  --  39.1  --   --  37.8*  --   --   --   PLT 210  --  199  --   --  183  --   --   --   HEPARINUNFRC 0.36  --  0.20* 0.21* 0.22* 0.23* 0.23*  --  0.19*  CREATININE 1.14   < > 1.06 1.06 1.20 1.19  --  1.03  --   CKTOTAL  --   --   --  45*  --   --   --   --   --    < > = values in this interval not displayed.    Estimated Creatinine Clearance: 92.3 mL/min (by C-G formula based on SCr of 1.03 mg/dL).  Assessment: 60 y.o.male with medical history of DM, HTN, strokes, and A. Fib on rate control and systemic AC who presented 02/14/21 as a transfer from [facility] ED for evaluation of limb ischemic and septic shock. Per medical record had been on Xarelto, unclear when patient had  his last dose. Patient started on Heparin infusion at [facility]. Pharmacy consulted to follow heparin drip.    Heparin levels were therapeutic on 4/23 and 4/24 on 1900 units/hr, then subtherapeutic despite multiple rate increases since that time. Heparin level this afternoon remains 0.23 on 2750 units/hr. No infusion problems reported.    Initial concern for RLE arterial occlusion, but per Vascular, unremarkable non-invasive studies. No vascular intervention planned.  4/26 PM update:  -Heparin level remains low despite multiple rate increases -I will have the RN change the heparin drip over to his other IV site just to see if that may change anything  Goal of Therapy:  Heparin level 0.3-0.7 units/ml Monitor platelets by anticoagulation protocol: Yes   Plan:  Inc heparin drip to 3150 units/hr 8 hour heparin level Noted plan for L transmetatarsal amputation on 4/27. Will follow up for timing holding/resuming heparin pre- and post-op Oral anticoagulation on hold  5/27, PharmD, BCPS Clinical Pharmacist Phone: [phone]

## 2021-02-19 NOTE — Progress Notes (Signed)
Patient not going to wear CPAP tonight. He is combative, confused and continues to take off his leads and O2. RN in room at time of RT assessment and agrees with plan. RN to call if anything changes.

## 2021-02-19 NOTE — Progress Notes (Signed)
[facility] Progress Note  Patient: Fernando Maddox    OEU:[id number]  DOA: 02/14/2021     Date of Service: the patient was seen and examined on 02/19/2021  Brief hospital course: Past medical history of type II DM, HTN, CVA, A. fib, chronic systolic CHF, CAD, substance abuse.  Patient presents with complaints of unresponsiveness with gangrene of bilateral lower extremity and septic shock. 4/21 admitted to [facility] from [facility] ED, central line placed, heparin drip started and vascular surgery consulted.  CT angio shows acute right arterial occlusion but Doppler negative for any obstruction 4/23 orthopedic consulted for amputation. 4/24 cardiology consulted for heart failure management. 4/27 scheduled for surgery for left transmetatarsal amputation  Currently plan is monitor postop recovery for amputation and hyponatremia correction as well as safe discharge plan.  Assessment and Plan: 1.  Critical limb ischemia PAD Diabetic foot with gangrene Septic shock POA Presents with unresponsiveness, found to have gangrene-like features of the right leg. Also has left leg edema CT scan concerning for arterial occlusion of the right mid superficial femoral artery. ABI shows index within normal range bilaterally. Vascular surgery recommends no intervention for now. Currently on therapeutic heparin drip. Orthopedic consulted, currently scheduled for left TMA on 4/27.  Optimized medically before surgery. MRSA PCR negative. Initially was on IV vancomycin and Zosyn.  Changed to Unasyn.  2.  Septic shock POA secondary to gangrene of extremity Also possible hypovolemic shock. Unresponsive event at home Acute metabolic encephalopathy Presented with unresponsiveness. Possibly down for 3 days at home.  Lives alone. At [facility] patient was started on Levophed. Currently off of pressor. UA negative. Blood cultures not available here. Mentation appears to be improving but still does not appear to  be back to baseline. No focal deficit therefore no further work-up. b12 1338, tsh, ammonia normal. Folate normal. No focal deficit. No further work up. Continue MVI.  3.  CAD Acute on chronic combined CHF. ?  Cor pulmonale Echocardiogram shows diastolic dysfunction. Prior EF of 35%.  Recent EF is 20%. Also shows RV failure. Cardiology consulted. Appreciate assistance. Lasix as needed.  Currently appear euvolemic per cardiology. Likely benefit from diuresis postoperatively. Need to add ACE inhibitor/ARNI/ARB when blood pressure is better. Monitor on telemetry.   4.  A. fib with RVR Known prior history. Currently on IV amiodarone.  Switch to p.o. 200 mg twice daily for 1 week and 200 mg daily thereafter. Management per cardiology. On IV heparin drip as well. Transition to oral when surgery is completed.  Was on Xarelto prior to admission. Monitor on telemetry progressive care unit.  5.  Hyponatremia, likely multifactorial. Hypoosmolar.  Patient also drinking water excessively. Currently will initiate fluid restriction and monitor. Sodium currently stable.  May consider tolvaptan if worsens postoperatively.  6.  Hypokalemia, hypomagnesemia, hypophosphatemia Currently replacing.  7.  Insomnia. Ambien as needed nightly.  8.  History of substance abuse. No UDS performed. UDS 3 years ago was only positive for amphetamine and cannabis. Per mother patient continues to use meth.  9.  OSA. Likely undiagnosed. Continue CPAP per pulmonary.  Noncompliant. Monitor.  10.  Moderate protein calorie malnutrition. Continue dietary supplementation. Appreciate dietary consult.  Body mass index is 27.86 kg/m.  Nutrition Problem: Moderate Malnutrition Etiology: chronic illness (CHF) Interventions: Interventions: MVI,Glucerna shake,Magic cup   11.  Sacral pressure injury, POA Continue foam dressing.  At risk for further worsening in the setting of decreased mobility and poor  nutritional status.  Pressure Injury 02/14/21 Sacrum Medial;Upper  Deep Tissue Pressure Injury - Purple or maroon localized area of discolored intact skin or blood-filled blister due to damage of underlying soft tissue from pressure and/or shear. (Active)  02/14/21 2030  Location: Sacrum  Location Orientation: Medial;Upper  Staging: Deep Tissue Pressure Injury - Purple or maroon localized area of discolored intact skin or blood-filled blister due to damage of underlying soft tissue from pressure and/or shear.  Wound Description (Comments):   Present on Admission: Yes    12.  Noncompliance. Patient has been noncompliant with his medical care. Patient has a history of substance abuse. Does not take his medication on a regular basis per mother. Lives alone. Unsure how he will do postoperatively for wound care. Currently refusing CPAP as needed. Patient may benefit from psychiatry consultation postoperatively if continues to remain noncompliant.  13.  1 episode of vomiting.  On 4/26. Etiology not clear. Currently good bowel sound.  No nausea.  He thinks that this was medication that caused the vomiting.  Monitor.  Diet: Cardiac diet DVT Prophylaxis: Therapeutic heparin  Advance goals of care discussion: Full code  Family Communication: no family was present at bedside, at the time of interview.  Discussed with mother on the phone.  Disposition:  Status is: Inpatient  Remains inpatient appropriate because:IV treatments appropriate due to intensity of illness or inability to take PO   Dispo: The patient is from: Home              Anticipated d/c is to: Home              Patient currently is not medically stable to d/c.   Difficult to place patient   Subjective: No nausea.  Had an episode of vomiting early this morning.  No further vomiting right now.  No further nausea.  No abdominal pain.  Good bowel sound.  Had a bowel movement yesterday.  Physical Exam:  General: Appear in  mild distress, no Rash; Oral Mucosa Clear, moist. no Abnormal Neck Mass Or lumps, Conjunctiva normal  Cardiovascular: S1 and S2 Present, no Murmur, Respiratory: good respiratory effort, Bilateral Air entry present and CTA, no Crackles, no wheezes Abdomen: Bowel Sound present, Soft and no tenderness Extremities: Bilateral pedal edema, bilateral legs wrapped. Neurology: Drowsy and oriented to time, place, and person affect appropriate. no new focal deficit, follows commands. Gait not checked due to patient safety concerns    Vitals:   02/19/21 0330 02/19/21 0756 02/19/21 1143 02/19/21 1614  BP: (!) 116/96 106/74 102/79 111/80  Pulse: 82 88 85 75  Resp: 20 20 20 19   Temp: 98 F (36.7 C) 98.5 F (36.9 C)  98 F (36.7 C)  TempSrc: Oral Oral Oral Oral  SpO2: 97%  100%   Weight:      Height:        Intake/Output Summary (Last 24 hours) at 02/19/2021 1804 Last data filed at 02/19/2021 1626 Gross per 24 hour  Intake 1058.65 ml  Output 1100 ml  Net -41.35 ml   Filed Weights   02/15/21 0500 02/16/21 0500 02/17/21 0500  Weight: 96.4 kg 99.8 kg 101.1 kg    Data Reviewed: I have personally reviewed and interpreted daily labs, tele strips, imaging. I reviewed all nursing notes, pharmacy notes, vitals, pertinent old records I have discussed plan of care as described above with RN and patient/family.  CBC: Recent Labs  Lab 02/15/21 0250 02/16/21 0622 02/17/21 0508 02/18/21 0121 02/19/21 0511  WBC 16.1* 11.4* 11.7* 12.4* 13.4*  HGB 12.6*  12.7* 12.6* 13.2 12.8*  HCT 38.5* 38.7* 37.7* 39.1 37.8*  MCV 84.6 86.2 84.0 82.1 82.2  PLT 226 222 210 199 183   Basic Metabolic Panel: Recent Labs  Lab 02/14/21 2127 02/15/21 0250 02/16/21 0622 02/16/21 0907 02/17/21 0508 02/17/21 1604 02/18/21 0121 02/18/21 0940 02/18/21 1837 02/19/21 0511 02/19/21 1629  NA 126*   < >  --    < > 122* 123* 123* 123* 122* 123* 124*  K 2.9*   < >  --    < > 3.7 3.2* 3.4* 3.2* 3.9 4.3 4.4  CL 94*   <  >  --    < > 90* 89* 90* 88* 88* 90* 92*  CO2 24   < >  --    < > 22 23 20* 25 23 20* 20*  GLUCOSE 167*   < >  --    < > 110* 157* 102* 126* 220* 127* 155*  BUN 12   < >  --    < > 18 19 17 17 18 17 16   CREATININE 0.99   < >  --    < > 1.14 1.05 1.06 1.06 1.20 1.19 1.03  CALCIUM 7.9*   < >  --    < > 8.0* 7.8* 7.9* 7.7* 7.7* 8.0* 8.3*  MG 1.8  --  1.8  --  1.8 1.5* 1.8  --   --  1.7  --   PHOS 2.8  --   --   --  2.1*  --  2.5  --   --   --   --    < > = values in this interval not displayed.    Studies: DG CHEST PORT 1 VIEW  Result Date: 02/18/2021 CLINICAL DATA:  Shortness of breath, weakness, and fatigue. EXAM: PORTABLE CHEST 1 VIEW COMPARISON:  02/15/2021 FINDINGS: The right central venous catheter has been removed. Cardiac enlargement. No vascular congestion, edema, or consolidation. No pleural effusions. No pneumothorax. Mediastinal contours appear intact. Old bilateral rib fractures. IMPRESSION: Cardiac enlargement.  No evidence of active pulmonary disease. Electronically Signed   By: 02/17/2021 M.D.   On: 02/18/2021 20:25    Scheduled Meds: . amiodarone  200 mg Oral BID  . Chlorhexidine Gluconate Cloth  6 each Topical Daily  . docusate sodium  100 mg Oral BID  . famotidine  20 mg Oral Daily  . feeding supplement (GLUCERNA SHAKE)  237 mL Oral BID BM  . insulin aspart  0-15 Units Subcutaneous TID WC  . insulin aspart  0-5 Units Subcutaneous QHS  . mouth rinse  15 mL Mouth Rinse BID  . multivitamin with minerals  1 tablet Oral Daily   Continuous Infusions: . ampicillin-sulbactam (UNASYN) IV 3 g (02/19/21 1210)  . heparin 3,000 Units/hr (02/19/21 1626)   PRN Meds: acetaminophen **OR** acetaminophen, albuterol, ondansetron **OR** ondansetron (ZOFRAN) IV, polyethylene glycol, zolpidem  Time spent:  35 minutes   Author: 02/21/21, MD [facility] 02/19/2021 6:04 PM  To reach On-call, see care teams to locate the attending and reach out via [email or web address]. Between  7PM-7AM, please contact night-coverage If you still have difficulty reaching the attending provider, please page the [facility] (Director on Call) for [facility] on amion for assistance.

## 2021-02-19 NOTE — Progress Notes (Signed)
Nutrition Follow-up / Consult  DOCUMENTATION CODES:   Not applicable  INTERVENTION:    Continue Glucerna Shake po BID, each supplement provides 220 kcal and 10 grams of protein.  Continue Magic cup TID with meals, each supplement provides 290 kcal and 9 grams of protein.  Continue MVI with minerals daily.  NUTRITION DIAGNOSIS:   Moderate Malnutrition related to chronic illness (CHF) as evidenced by mild muscle depletion,moderate muscle depletion,mild fat depletion,moderate fat depletion.  Ongoing  GOAL:   Patient will meet greater than or equal to 90% of their needs   Progressing   MONITOR:   PO intake,Supplement acceptance,Labs,Skin  REASON FOR ASSESSMENT:   Consult Assessment of nutrition requirement/status  ASSESSMENT:   60 yo male admitted with unresponsiveness, gangrene RLE, septic shock. PMH includes DM-2, HTN, CVA, A fib, CHF, CAD, substance abuse.  Plans for RLE TMA tomorrow.   Remains on a heart healthy carbohydrate modified diet with 1200 ml fluid restriction. Meal intakes: 5-75%  Patient states that he likes the magic cup supplements on his meal trays and has been drinking Glucerna Shake supplements between meals. He threw up this morning, unsure why. Currently asking for something to drink.    Labs reviewed. Na 123 CBG: 120-153  Medications reviewed and include Novolog SSI, MVI with minerals.  Diet Order:   Diet Order            Diet heart healthy/carb modified Room service appropriate? Yes; Fluid consistency: Thin; Fluid restriction: 1200 mL Fluid  Diet effective now                 EDUCATION NEEDS:   Education needs have been addressed  Skin:  Skin Assessment: Skin Integrity Issues: Skin Integrity Issues:: Other (Comment),DTI DTI: sacrum Other: non pressure wound L foot   Last BM:  4/24 type 1  Height:   Ht Readings from Last 1 Encounters:  02/18/21 6\' 3"  (1.905 m)    Weight:   Wt Readings from Last 1 Encounters:  02/17/21  101.1 kg    Ideal Body Weight:  89.1 kg  BMI:  Body mass index is 27.86 kg/m.  Estimated Nutritional Needs:   Kcal:  2500-2700  Protein:  140-160 gm  Fluid:  >/= 2.5 L    02/19/21, RD, LDN, CNSC Please refer to Amion for contact information.

## 2021-02-19 NOTE — Progress Notes (Signed)
ANTICOAGULATION CONSULT NOTE  Pharmacy Consult for Heparin Indication: atrial fibrillation   Allergies  Allergen Reactions  . Codeine Itching  . Bee Venom Other (See Comments)    "Feels like his head is on fire when he goes outside in the sun"  . Hydrocodone-Acetaminophen Other (See Comments)  . Other Other (See Comments)    "Feels like his head is on fire when he goes outside in the sun"  . Cephalexin     No mention of this allergy in notes from 07/2016 admission for osteomyelitis.  Patient was treated with Ancef and Unasyn for broader anaerobic coverage.  Patient is not aware of allergy.  . Vicodin [Hydrocodone-Acetaminophen] Itching  . Zocor [Simvastatin] Other (See Comments)    Myalgias    Patient Measurements: Height: 6\' 3"  (190.5 cm) Weight: 101.1 kg (222 lb 14.2 oz) IBW/kg (Calculated) : 84.5  Vital Signs: Temp: 98 F (36.7 C) (04/26 0330) Temp Source: Oral (04/26 0330) BP: 116/96 (04/26 0330) Pulse Rate: 82 (04/26 0330)  Labs: Recent Labs    02/17/21 0508 02/17/21 1604 02/18/21 0121 02/18/21 0940 02/18/21 1837 02/19/21 0511  HGB 12.6*  --  13.2  --   --  12.8*  HCT 37.7*  --  39.1  --   --  37.8*  PLT 210  --  199  --   --  183  HEPARINUNFRC 0.36  --  0.20* 0.21* 0.22* 0.23*  CREATININE 1.14   < > 1.06 1.06 1.20 1.19  CKTOTAL  --   --   --  45*  --   --    < > = values in this interval not displayed.    Estimated Creatinine Clearance: 79.9 mL/min (by C-G formula based on SCr of 1.19 mg/dL).  Assessment: 60 y.o. male with h/o Afib, Xarelto on hold, for heparin  Goal of Therapy:  Heparin level 0.3-0.7 units/ml  Monitor platelets by anticoagulation protocol: Yes   Plan:  Increase Heparin 2750 units/hr  46, PharmD, BCPS

## 2021-02-19 NOTE — Progress Notes (Addendum)
Progress Note  Patient Name: Fernando Maddox Date of Encounter: 02/19/2021  [facility] Cardiologist: Garwin Brothers, MD   Subjective   Feels breathing is stable. Pain adequately controlled. No complaints of palpitations or chest pain  Inpatient Medications    Scheduled Meds: . amiodarone  200 mg Oral BID  . Chlorhexidine Gluconate Cloth  6 each Topical Daily  . docusate sodium  100 mg Oral BID  . famotidine  20 mg Oral Daily  . feeding supplement (GLUCERNA SHAKE)  237 mL Oral BID BM  . insulin aspart  0-15 Units Subcutaneous TID WC  . insulin aspart  0-5 Units Subcutaneous QHS  . mouth rinse  15 mL Mouth Rinse BID  . multivitamin with minerals  1 tablet Oral Daily  . mupirocin ointment  1 application Nasal BID  . thiamine  100 mg Oral Daily   Continuous Infusions: . ampicillin-sulbactam (UNASYN) IV 3 g (02/19/21 0506)  . heparin 2,750 Units/hr (02/19/21 0640)   PRN Meds: acetaminophen **OR** acetaminophen, albuterol, ondansetron **OR** ondansetron (ZOFRAN) IV, polyethylene glycol, zolpidem   Vital Signs    Vitals:   02/18/21 2022 02/18/21 2320 02/19/21 0330 02/19/21 0756  BP:  (!) 113/92 (!) 116/96 106/74  Pulse:  85 82 88  Resp:  15 20 20   Temp:  98 F (36.7 C) 98 F (36.7 C) 98.5 F (36.9 C)  TempSrc:  Oral Oral Oral  SpO2: 95% 95% 97%   Weight:      Height:        Intake/Output Summary (Last 24 hours) at 02/19/2021 0838 Last data filed at 02/19/2021 0506 Gross per 24 hour  Intake 1102.6 ml  Output 1100 ml  Net 2.6 ml   Last 3 Weights 02/17/2021 02/16/2021 02/15/2021  Weight (lbs) 222 lb 14.2 oz 220 lb 0.3 oz 212 lb 8.4 oz  Weight (kg) 101.1 kg 99.8 kg 96.4 kg      Telemetry    Atrial fibrillation with rates predominately in the 80s-100s - Personally Reviewed  ECG    No new tracings - Personally Reviewed  Physical Exam   GEN: No acute distress.   Neck: No JVD Cardiac: IRIR, no murmurs, rubs, or gallops.  Respiratory: Clear to auscultation  bilaterally. GI: Soft, nontender, non-distended  MS: 1+ LLE edema with foot wrapped in dressing; s/p partial right great toe amputation, dusky appearing right foot with faint palpable DP pulses. Neuro:  Nonfocal  Psych: Normal affect   Labs    High Sensitivity Troponin:  No results for input(s): TROPONINIHS in the last 720 hours.    Chemistry Recent Labs  Lab 02/14/21 2127 02/15/21 0250 02/18/21 0940 02/18/21 1837 02/19/21 0511  NA 126*   < > 123* 122* 123*  K 2.9*   < > 3.2* 3.9 4.3  CL 94*   < > 88* 88* 90*  CO2 24   < > 25 23 20*  GLUCOSE 167*   < > 126* 220* 127*  BUN 12   < > 17 18 17   CREATININE 0.99   < > 1.06 1.20 1.19  CALCIUM 7.9*   < > 7.7* 7.7* 8.0*  PROT 5.2*  --   --   --   --   ALBUMIN 2.6*  --   --   --   --   AST 20  --   --   --   --   ALT 23  --   --   --   --  ALKPHOS 72  --   --   --   --   BILITOT 1.8*  --   --   --   --   GFRNONAA >60   < > >60 >60 >60  ANIONGAP 8   < > 10 11 13    < > = values in this interval not displayed.     Hematology Recent Labs  Lab 02/17/21 0508 02/18/21 0121 02/19/21 0511  WBC 11.7* 12.4* 13.4*  RBC 4.49 4.76 4.60  HGB 12.6* 13.2 12.8*  HCT 37.7* 39.1 37.8*  MCV 84.0 82.1 82.2  MCH 28.1 27.7 27.8  MCHC 33.4 33.8 33.9  RDW 16.4* 16.3* 16.6*  PLT 210 199 183    BNPNo results for input(s): BNP, PROBNP in the last 168 hours.   DDimer No results for input(s): DDIMER in the last 168 hours.   Radiology    DG CHEST PORT 1 VIEW  Result Date: 02/18/2021 CLINICAL DATA:  Shortness of breath, weakness, and fatigue. EXAM: PORTABLE CHEST 1 VIEW COMPARISON:  02/15/2021 FINDINGS: The right central venous catheter has been removed. Cardiac enlargement. No vascular congestion, edema, or consolidation. No pleural effusions. No pneumothorax. Mediastinal contours appear intact. Old bilateral rib fractures. IMPRESSION: Cardiac enlargement.  No evidence of active pulmonary disease. Electronically Signed   By: 02/17/2021  M.D.   On: 02/18/2021 20:25    Cardiac Studies   Echocardiogram 02/15/21: 1. Left ventricular ejection fraction, by estimation, is 20 to 25%. The  left ventricle has severely decreased function. The left ventricle  demonstrates global hypokinesis. The left ventricular internal cavity size  was moderately dilated. Left  ventricular diastolic function could not be evaluated. There is the  interventricular septum is flattened in systole and diastole, consistent  with right ventricular pressure and volume overload.  2. Right ventricular systolic function is severely reduced. The right  ventricular size is moderately enlarged.  3. Left atrial size was moderately dilated.  4. Right atrial size was moderately dilated.  5. The mitral valve is normal in structure. Mild to moderate mitral valve  regurgitation.  6. Tricuspid valve regurgitation is mild to moderate.  7. The aortic valve is tricuspid. There is mild calcification of the  aortic valve. Aortic valve regurgitation is not visualized.  8. Aortic dilatation noted. There is borderline dilatation of the  ascending aorta, measuring 40 mm.  9. The inferior vena cava is dilated in size with <50% respiratory  variability, suggesting right atrial pressure of 15 mmHg.   Patient Profile     60 y.o. male with PMH of CAD (notes indicate possible prior stenting?), permanent atrial fibrillation, chronic combined CHF, HTN, HLD, DM type 2, PAD s/p partial great toe amputation of RLE, CVA, OSA on CPAP, and tobacco abuse, who is being followed by cardiology for CHF and Afib.   Assessment & Plan    1. Acute on chronic combined CHF with new RV failure: history of known EF 30% on echo in 2017. Etiology unclear, though possibly tachy-mediated with known history of Afib. Now with Echo this admission showing EF 20-25% with severely reduced RV systolic function. Initially required pressor support for septic shock, now weaned off. He was diuresed with IV  lasix, last dose 02/17/21, with UOP net -4.6L this admission. Maintaining euvolemic status.  - Continue to monitor volume status close in the perioperative setting - strict I&Os and daily weights - Hopeful to reintroduce GDMT at BP stabilizes - on metoprolol succinate and spironolactone prior to admission. Would  also consider adding entresto +/- SGLT2-inhibitor if Cr remains stable.   2. Permanent atrial fibrillation: rates stable on amiodarone IV>po 200mg  BID x 2 weeks with plans to reduce to 200mg  daily going forward. Xarelto on hold in anticipation of left metatarsal amputation, tentatively scheduled for tomorrow.  - Continue heparin gtt of stroke ppx - Continue amiodarone for rate control  3. Septic shock in the setting of gangrenous LLE: initially required pressor support, now weaned off. On IV antibiotics.  - Continue management per primary team, vascular surgery, and orthopedics   4. CAD: with questionable prior stenting. Not on aspirin due to need for anticoagulation. LDL 55 this admission - Continue risk factor modifications for goal BP <130/80, LDL <70, and A1C <7  5. DM type 2: A1C 7.7 this admission; Goal <7 - Continue management per primary team - Consider adding SGLT2-inhibitor in light of #1  6. PAD: s/p partial amputation of R great toe in the past. Initially some concern for RLE arterial occlusion on imaging at [facility] prompting transfer to [facility], however repeat imaging unremarkable after starting IV heparin. Now with gangrenous left forefoot with plans for TMA by ortho 02/20/21.  - Continue management per primary team, vascular, and ortho       For questions or updates, please contact [facility] Please consult [email or web address] for contact info under        Signed, [facility], PA-C  02/19/2021, 8:38 AM    Personally seen and examined. Agree with above.   No complaints of chest pain or palpitations.  Breathing appears stable.  Currently on amiodarone  200 mg twice a day with atrial fibrillation heart rates less than 100.  EF 20 to 25% with reduced RV function.  Previously diuresed with IV Lasix now maintaining euvolemia.  Once his blood pressure stabilizes we will start to push goal-directed medical therapy.  Holding Xarelto in anticipation of metatarsal amputation.  Continue with amiodarone for his atrial fibrillation.  Beatriz Stallion, MD

## 2021-02-19 NOTE — Progress Notes (Signed)
ANTICOAGULATION CONSULT NOTE - Follow Up Consult  Pharmacy Consult for Heparin Indication: atrial fibrillation  Allergies  Allergen Reactions  . Codeine Itching  . Bee Venom Other (See Comments)    "Feels like his head is on fire when he goes outside in the sun"  . Hydrocodone-Acetaminophen Other (See Comments)  . Other Other (See Comments)    "Feels like his head is on fire when he goes outside in the sun"  . Cephalexin     No mention of this allergy in notes from 07/2016 admission for osteomyelitis.  Patient was treated with Ancef and Unasyn for broader anaerobic coverage.  Patient is not aware of allergy.  . Vicodin [Hydrocodone-Acetaminophen] Itching  . Zocor [Simvastatin] Other (See Comments)    Myalgias    Patient Measurements: Height: 6\' 3"  (190.5 cm) Weight: 101.1 kg (222 lb 14.2 oz) IBW/kg (Calculated) : 84.5 Heparin Dosing Weight: 101 kg  Vital Signs: Temp: 98.5 F (36.9 C) (04/26 0756) Temp Source: Oral (04/26 1143) BP: 102/79 (04/26 1143) Pulse Rate: 85 (04/26 1143)  Labs: Recent Labs    02/17/21 0508 02/17/21 1604 02/18/21 0121 02/18/21 0940 02/18/21 1837 02/19/21 0511 02/19/21 1414  HGB 12.6*  --  13.2  --   --  12.8*  --   HCT 37.7*  --  39.1  --   --  37.8*  --   PLT 210  --  199  --   --  183  --   HEPARINUNFRC 0.36  --  0.20* 0.21* 0.22* 0.23* 0.23*  CREATININE 1.14   < > 1.06 1.06 1.20 1.19  --   CKTOTAL  --   --   --  45*  --   --   --    < > = values in this interval not displayed.    Estimated Creatinine Clearance: 79.9 mL/min (by C-G formula based on SCr of 1.19 mg/dL).  Assessment: 59 y.o.male with medical history of DM, HTN, strokes, and A. Fib on rate control and systemic AC who presented 02/14/21 as a transfer from [facility] ED for evaluation of limb ischemic and septic shock. Per medical record had been on Xarelto, unclear when patient had his last dose. Patient started on Heparin infusion at [facility]. Pharmacy consulted to  follow heparin drip.    Heparin levels were therapeutic on 4/23 and 4/24 on 1900 units/hr, then subtherapeutic despite multiple rate increases since that time. Heparin level this afternoon remains 0.23 on 2750 units/hr. No infusion problems reported.    Initial concern for RLE arterial occlusion, but per Vascular, unremarkable non-invasive studies. No vascular intervention planned.  Goal of Therapy:  Heparin level 0.3-0.7 units/ml Monitor platelets by anticoagulation protocol: Yes   Plan:  Increase heparin drip to 3000 units/hr Heparin level ~6 hrs after increase Daily heparin level and CBC Noted plan for L transmetatarsal amputation on 4/27. Will follow up for timing holding/resuming heparin pre- and post-op.  Oral anticoagulation on hold  5/27, Fernando Maddox 02/19/2021,4:14 PM

## 2021-02-19 NOTE — Progress Notes (Signed)
Patient ID: Fernando Maddox, male   DOB: [birth year], 60 y.o.   MRN: [id number] Plan for left transmetatarsal amputation tomorrow.  Patient states that he is anxious to leave the hospital after surgery.  Hemoglobin is stable white blood cell count still elevated at 13.4.  Lab Results  Component Value Date   HGBA1C 7.7 (H) 02/18/2021   HGBA1C 7.8 (H) 02/16/2021   HGBA1C (H) 01/15/2010    7.2 (NOTE) The ADA recommends the following therapeutic goal for glycemic control related to Hgb A1c measurement: Goal of therapy: <6.5 Hgb A1c  Reference: American Diabetes Association: Clinical Practice Recommendations 2010, Diabetes Care, 2010, 33: (Suppl  1).   ESRSEDRATE 14 02/18/2021   CRP 13.2 (H) 02/18/2021   LABURIC 4.6 02/18/2021    CBC EXTENDED Latest Ref Rng & Units 02/19/2021 02/18/2021 02/17/2021  WBC 4.0 - 10.5 K/uL 13.4(H) 12.4(H) 11.7(H)  RBC 4.22 - 5.81 MIL/uL 4.60 4.76 4.49  HGB 13.0 - 17.0 g/dL 12.8(L) 13.2 12.6(L)  HCT 39.0 - 52.0 % 37.8(L) 39.1 37.7(L)  PLT 150 - 400 K/uL 183 199 210

## 2021-02-20 ENCOUNTER — Encounter (HOSPITAL_COMMUNITY)
Admission: AD | Disposition: A | Discharge status: Skilled Nursing Facility | Payer: Self-pay | Source: Other Acute Inpatient Hospital | Attending: Internal Medicine

## 2021-02-20 ENCOUNTER — Encounter (HOSPITAL_COMMUNITY): Payer: Self-pay | Admitting: Internal Medicine

## 2021-02-20 ENCOUNTER — Inpatient Hospital Stay (HOSPITAL_COMMUNITY): Payer: Medicare Other | Admitting: Registered Nurse

## 2021-02-20 DIAGNOSIS — A419 Sepsis, unspecified organism: Secondary | ICD-10-CM | POA: Diagnosis not present

## 2021-02-20 DIAGNOSIS — I96 Gangrene, not elsewhere classified: Secondary | ICD-10-CM

## 2021-02-20 DIAGNOSIS — R6521 Severe sepsis with septic shock: Secondary | ICD-10-CM | POA: Diagnosis not present

## 2021-02-20 DIAGNOSIS — L97524 Non-pressure chronic ulcer of other part of left foot with necrosis of bone: Secondary | ICD-10-CM | POA: Diagnosis not present

## 2021-02-20 DIAGNOSIS — Z7189 Other specified counseling: Secondary | ICD-10-CM | POA: Diagnosis not present

## 2021-02-20 DIAGNOSIS — Z515 Encounter for palliative care: Secondary | ICD-10-CM

## 2021-02-20 HISTORY — PX: AMPUTATION: SHX166

## 2021-02-20 LAB — MAGNESIUM: Magnesium: 2 mg/dL (ref 1.7–2.4)

## 2021-02-20 LAB — BASIC METABOLIC PANEL
Anion gap: 15 (ref 5–15)
BUN: 17 mg/dL (ref 6–20)
CO2: 21 mmol/L — ABNORMAL LOW (ref 22–32)
Calcium: 8.3 mg/dL — ABNORMAL LOW (ref 8.9–10.3)
Chloride: 90 mmol/L — ABNORMAL LOW (ref 98–111)
Creatinine, Ser: 1.05 mg/dL (ref 0.61–1.24)
GFR, Estimated: >60 mL/min (ref >60)
Glucose, Bld: 86 mg/dL (ref 70–99)
Potassium: 4.1 mmol/L (ref 3.5–5.1)
Sodium: 126 mmol/L — ABNORMAL LOW (ref 135–145)

## 2021-02-20 LAB — OSMOLALITY, URINE: Osmolality, Ur: 519 mOsm/kg (ref 300–900)

## 2021-02-20 LAB — CBC
HCT: 37.7 % — ABNORMAL LOW (ref 39.0–52.0)
Hemoglobin: 12.4 g/dL — ABNORMAL LOW (ref 13.0–17.0)
MCH: 27.7 pg (ref 26.0–34.0)
MCHC: 32.9 g/dL (ref 30.0–36.0)
MCV: 84.3 fL (ref 80.0–100.0)
Platelets: 163 10*3/uL (ref 150–400)
RBC: 4.47 MIL/uL (ref 4.22–5.81)
RDW: 16.9 % — ABNORMAL HIGH (ref 11.5–15.5)
WBC: 14.2 10*3/uL — ABNORMAL HIGH (ref 4.0–10.5)
nRBC: 0.1 % (ref 0.0–0.2)

## 2021-02-20 LAB — SARS CORONAVIRUS 2 BY RT PCR (HOSPITAL ORDER, PERFORMED IN ~~LOC~~ HOSPITAL LAB): SARS Coronavirus 2: NEGATIVE

## 2021-02-20 LAB — GLUCOSE, CAPILLARY
Glucose-Capillary: 82 mg/dL (ref 70–99)
Glucose-Capillary: 78 mg/dL (ref 70–99)
Glucose-Capillary: 127 mg/dL — ABNORMAL HIGH (ref 70–99)
Glucose-Capillary: 94 mg/dL (ref 70–99)
Glucose-Capillary: 73 mg/dL (ref 70–99)

## 2021-02-20 LAB — OSMOLALITY: Osmolality: 271 mOsm/kg — ABNORMAL LOW (ref 275–295)

## 2021-02-20 LAB — HEPARIN LEVEL (UNFRACTIONATED): Heparin Unfractionated: 0.14 IU/mL — ABNORMAL LOW (ref 0.30–0.70)

## 2021-02-20 LAB — SODIUM, URINE, RANDOM: Sodium, Ur: 32 mmol/L

## 2021-02-20 SURGERY — AMPUTATION, FOOT, PARTIAL
Anesthesia: Monitor Anesthesia Care | Site: Foot | Laterality: Left

## 2021-02-20 MED ORDER — AMISULPRIDE (ANTIEMETIC) 5 MG/2ML IV SOLN: 10.0000 mg | Freq: Once | INTRAVENOUS | Status: DC | PRN

## 2021-02-20 MED ORDER — ONDANSETRON HCL 4 MG/2ML IJ SOLN
INTRAMUSCULAR | Status: DC | PRN
  Administered 2021-02-20: 4 mg via INTRAVENOUS

## 2021-02-20 MED ORDER — SODIUM CHLORIDE 0.9 % IV SOLN: INTRAVENOUS | Status: DC

## 2021-02-20 MED ORDER — MEPERIDINE HCL 25 MG/ML IJ SOLN: 6.2500 mg | INTRAMUSCULAR | Status: DC | PRN

## 2021-02-20 MED ORDER — FENTANYL CITRATE (PF) 100 MCG/2ML IJ SOLN
INTRAMUSCULAR | Status: AC
  Administered 2021-02-20: 50 ug via INTRAVENOUS
  Filled 2021-02-20: qty 2

## 2021-02-20 MED ORDER — LIDOCAINE 2% (20 MG/ML) 5 ML SYRINGE
INTRAMUSCULAR | Status: DC | PRN
  Administered 2021-02-20: 80 mg via INTRAVENOUS

## 2021-02-20 MED ORDER — MAGNESIUM CITRATE PO SOLN: 1.0000 | Freq: Once | ORAL | Status: DC | PRN

## 2021-02-20 MED ORDER — PROMETHAZINE HCL 25 MG/ML IJ SOLN
6.2500 mg | INTRAMUSCULAR | Status: DC | PRN
Start: 2021-02-20 — End: 2021-02-20

## 2021-02-20 MED ORDER — ZINC SULFATE 220 (50 ZN) MG PO CAPS
220.0000 mg | ORAL_CAPSULE | Freq: Every day | ORAL | Status: AC
  Administered 2021-02-20 – 2021-03-05 (×14): 220 mg via ORAL
  Filled 2021-02-20 (×14): qty 1

## 2021-02-20 MED ORDER — HYDROMORPHONE HCL 1 MG/ML IJ SOLN
0.5000 mg | INTRAMUSCULAR | Status: DC | PRN
  Administered 2021-02-20 – 2021-02-21 (×2): 0.5 mg via INTRAVENOUS
  Filled 2021-02-20 (×3): qty 0.5

## 2021-02-20 MED ORDER — ACETAMINOPHEN 325 MG PO TABS
325.0000 mg | ORAL_TABLET | Freq: Once | ORAL | Status: DC | PRN
Start: 2021-02-20 — End: 2021-02-20

## 2021-02-20 MED ORDER — PANTOPRAZOLE SODIUM 40 MG PO TBEC
40.0000 mg | DELAYED_RELEASE_TABLET | Freq: Every day | ORAL | Status: DC
  Administered 2021-02-21 – 2021-03-09 (×16): 40 mg via ORAL
  Filled 2021-02-20 (×16): qty 1

## 2021-02-20 MED ORDER — MIDAZOLAM HCL 2 MG/2ML IJ SOLN
INTRAMUSCULAR | Status: AC
  Administered 2021-02-20: 1 mg via INTRAVENOUS
  Filled 2021-02-20: qty 2

## 2021-02-20 MED ORDER — MIDAZOLAM HCL 2 MG/2ML IJ SOLN: 1.0000 mg | Freq: Once | INTRAMUSCULAR | Status: AC

## 2021-02-20 MED ORDER — ALUM & MAG HYDROXIDE-SIMETH 200-200-20 MG/5ML PO SUSP: 15.0000 mL | ORAL | Status: DC | PRN

## 2021-02-20 MED ORDER — BUPIVACAINE-EPINEPHRINE (PF) 0.5% -1:200000 IJ SOLN
INTRAMUSCULAR | Status: DC | PRN
  Administered 2021-02-20: 30 mL via PERINEURAL

## 2021-02-20 MED ORDER — OXYCODONE HCL 5 MG PO TABS
5.0000 mg | ORAL_TABLET | Freq: Once | ORAL | Status: AC
Start: 2021-02-20 — End: 2021-02-20
  Administered 2021-02-20: 5 mg via ORAL
  Filled 2021-02-20: qty 1

## 2021-02-20 MED ORDER — LACTATED RINGERS IV SOLN: INTRAVENOUS | Status: DC | PRN

## 2021-02-20 MED ORDER — FENTANYL CITRATE (PF) 100 MCG/2ML IJ SOLN: 25.0000 ug | INTRAMUSCULAR | Status: DC | PRN

## 2021-02-20 MED ORDER — ROPIVACAINE HCL 5 MG/ML IJ SOLN
INTRAMUSCULAR | Status: DC | PRN
  Administered 2021-02-20: 20 mL via EPIDURAL

## 2021-02-20 MED ORDER — BISACODYL 5 MG PO TBEC: 5.0000 mg | DELAYED_RELEASE_TABLET | Freq: Every day | ORAL | Status: DC | PRN

## 2021-02-20 MED ORDER — ACETAMINOPHEN 160 MG/5ML PO SOLN: 325.0000 mg | Freq: Once | ORAL | Status: DC | PRN

## 2021-02-20 MED ORDER — PROPOFOL 500 MG/50ML IV EMUL
INTRAVENOUS | Status: DC | PRN
  Administered 2021-02-20: 75 ug/kg/min via INTRAVENOUS

## 2021-02-20 MED ORDER — GUAIFENESIN-DM 100-10 MG/5ML PO SYRP: 15.0000 mL | ORAL_SOLUTION | ORAL | Status: DC | PRN

## 2021-02-20 MED ORDER — ACETAMINOPHEN 10 MG/ML IV SOLN: 1000.0000 mg | Freq: Once | INTRAVENOUS | Status: DC | PRN

## 2021-02-20 MED ORDER — ASCORBIC ACID 500 MG PO TABS
1000.0000 mg | ORAL_TABLET | Freq: Every day | ORAL | Status: DC
  Administered 2021-02-21 – 2021-03-09 (×16): 1000 mg via ORAL
  Filled 2021-02-20 (×16): qty 2

## 2021-02-20 MED ORDER — HEPARIN (PORCINE) 25000 UT/250ML-% IV SOLN
3450.0000 [IU]/h | INTRAVENOUS | Status: DC
  Administered 2021-02-21 (×2): 3150 [IU]/h via INTRAVENOUS
  Filled 2021-02-20 (×3): qty 250

## 2021-02-20 MED ORDER — LACTATED RINGERS IV SOLN: INTRAVENOUS | Status: DC

## 2021-02-20 MED ORDER — CEFAZOLIN SODIUM-DEXTROSE 2-4 GM/100ML-% IV SOLN
2.0000 g | INTRAVENOUS | Status: AC
  Administered 2021-02-20: 2 g via INTRAVENOUS
  Filled 2021-02-20: qty 100

## 2021-02-20 MED ORDER — PROPOFOL 10 MG/ML IV BOLUS
INTRAVENOUS | Status: DC | PRN
  Administered 2021-02-20: 20 mg via INTRAVENOUS

## 2021-02-20 MED ORDER — PHENOL 1.4 % MT LIQD: 1.0000 | OROMUCOSAL | Status: DC | PRN

## 2021-02-20 MED ORDER — CHLORHEXIDINE GLUCONATE 0.12 % MT SOLN
OROMUCOSAL | Status: AC
  Filled 2021-02-20: qty 15

## 2021-02-20 MED ORDER — FENTANYL CITRATE (PF) 100 MCG/2ML IJ SOLN: 50.0000 ug | Freq: Once | INTRAMUSCULAR | Status: AC

## 2021-02-20 SURGICAL SUPPLY — 35 items
APL SKNCLS STERI-STRIP NONHPOA (GAUZE/BANDAGES/DRESSINGS) ×1
BENZOIN TINCTURE PRP APPL 2/3 (GAUZE/BANDAGES/DRESSINGS) ×2 IMPLANT
BLADE SAW SGTL HD 18.5X60.5X1. (BLADE) ×2 IMPLANT
BLADE SURG 21 STRL SS (BLADE) ×2 IMPLANT
BNDG COHESIVE 4X5 TAN STRL (GAUZE/BANDAGES/DRESSINGS) IMPLANT
BNDG GAUZE ELAST 4 BULKY (GAUZE/BANDAGES/DRESSINGS) IMPLANT
CANISTER PREVENA PLUS 150 (CANNISTER) ×1 IMPLANT
COVER SURGICAL LIGHT HANDLE (MISCELLANEOUS) ×2 IMPLANT
COVER WAND RF STERILE (DRAPES) ×2 IMPLANT
DRAPE DERMATAC (DRAPES) ×2 IMPLANT
DRAPE INCISE IOBAN 66X45 STRL (DRAPES) ×2 IMPLANT
DRAPE U-SHAPE 47X51 STRL (DRAPES) ×2 IMPLANT
DRSG ADAPTIC 3X8 NADH LF (GAUZE/BANDAGES/DRESSINGS) IMPLANT
DRSG PAD ABDOMINAL 8X10 ST (GAUZE/BANDAGES/DRESSINGS) IMPLANT
DURAPREP 26ML APPLICATOR (WOUND CARE) ×2 IMPLANT
ELECT REM PT RETURN 9FT ADLT (ELECTROSURGICAL) ×2
ELECTRODE REM PT RTRN 9FT ADLT (ELECTROSURGICAL) ×1 IMPLANT
GAUZE SPONGE 4X4 12PLY STRL (GAUZE/BANDAGES/DRESSINGS) IMPLANT
GLOVE BIOGEL PI IND STRL 9 (GLOVE) ×1 IMPLANT
GLOVE BIOGEL PI INDICATOR 9 (GLOVE) ×1
GLOVE SURG ORTHO 9.0 STRL STRW (GLOVE) ×2 IMPLANT
GOWN STRL REUS W/ TWL XL LVL3 (GOWN DISPOSABLE) ×3 IMPLANT
GOWN STRL REUS W/TWL XL LVL3 (GOWN DISPOSABLE) ×6
KIT BASIN OR (CUSTOM PROCEDURE TRAY) ×2 IMPLANT
KIT TURNOVER KIT B (KITS) ×2 IMPLANT
NS IRRIG 1000ML POUR BTL (IV SOLUTION) ×2 IMPLANT
PACK ORTHO EXTREMITY (CUSTOM PROCEDURE TRAY) ×2 IMPLANT
PAD ARMBOARD 7.5X6 YLW CONV (MISCELLANEOUS) ×4 IMPLANT
SPONGE LAP 18X18 RF (DISPOSABLE) IMPLANT
SUT ETHILON 2 0 PSLX (SUTURE) ×4 IMPLANT
TOWEL GREEN STERILE (TOWEL DISPOSABLE) ×2 IMPLANT
TOWEL GREEN STERILE FF (TOWEL DISPOSABLE) ×2 IMPLANT
TUBE CONNECTING 12X1/4 (SUCTIONS) ×2 IMPLANT
WATER STERILE IRR 1000ML POUR (IV SOLUTION) ×2 IMPLANT
YANKAUER SUCT BULB TIP NO VENT (SUCTIONS) ×2 IMPLANT

## 2021-02-20 NOTE — Progress Notes (Signed)
Patient arrived back to unit from surgery. Answers all orientation questions appropriately but still lethargic from anesthesia. VSS. Patient has a provena wound vac to left foot, wrapped with coban. Respirations even and unlabored. He is on ra. Denies any pain or discomfort at this time. Will continue to monitor. Call light and possessions in reach.

## 2021-02-20 NOTE — Progress Notes (Signed)
Orthopedic Tech Progress Note Patient Details:  Fernando Maddox May 16, 1961 [id number]  Ortho Devices Type of Ortho Device: Postop shoe/boot Ortho Device/Splint Location: LLE Ortho Device/Splint Interventions: Ordered   Post Interventions Patient Tolerated: Other (comment) Instructions Provided: Other (comment)   Michelle Piper 02/20/2021, 6:36 PM

## 2021-02-20 NOTE — Op Note (Signed)
02/20/2021  12:53 PM  PATIENT:  Fernando Maddox    PRE-OPERATIVE DIAGNOSIS:  Gangrene Left Foot  POST-OPERATIVE DIAGNOSIS:  Same  PROCEDURE:  LEFT TRANSMETATARSAL AMPUTATION  SURGEON:  [name], MD  PHYSICIAN ASSISTANT:None ANESTHESIA:   General  PREOPERATIVE INDICATIONS:  Fernando Maddox is a  60 y.o. male with a diagnosis of Gangrene Left Foot who failed conservative measures and elected for surgical management.    The risks benefits and alternatives were discussed with the patient preoperatively including but not limited to the risks of infection, bleeding, nerve injury, cardiopulmonary complications, the need for revision surgery, among others, and the patient was willing to proceed.  OPERATIVE IMPLANTS: 13 cm Prevena incisional wound VAC  @ENCIMAGES @  OPERATIVE FINDINGS: Margins were clear  OPERATIVE PROCEDURE: Patient was brought the operating room after undergoing a regional anesthetic.  After adequate levels anesthesia were obtained patient's left lower extremity was prepped using DuraPrep draped into a sterile field a timeout was called.  A fishmouth incision was made just proximal to the necrotic tissue this was proximal to the abscess.  A oscillating saw was used to perform a transmetatarsal amputation.  Electrocautery was used hemostasis the wound was irrigated with normal saline the incision was closed using 2-0 nylon a 13 cm wound VAC was applied this had a good suction fit this was overwrapped with Coban patient was taken to the PACU in stable condition.   DISCHARGE PLANNING:  Antibiotic duration: Continue antibiotics for 24 hours  Weightbearing: Touchdown weightbearing on the heel.  Pain medication: Opioid pathway  Dressing care/ Wound VAC: Continue wound VAC for 1 week  Ambulatory devices: Walker or crutches  Discharge to: Anticipate discharge to home  Follow-up: In the office 1 week post operative.

## 2021-02-20 NOTE — Plan of Care (Signed)
  Problem: Clinical Measurements: Goal: Ability to maintain clinical measurements within normal limits will improve Outcome: Progressing Goal: Will remain free from infection Outcome: Progressing Goal: Diagnostic test results will improve Outcome: Progressing Goal: Respiratory complications will improve Outcome: Progressing Goal: Cardiovascular complication will be avoided Outcome: Progressing   Problem: Activity: Goal: Risk for activity intolerance will decrease Outcome: Progressing   Problem: Nutrition: Goal: Adequate nutrition will be maintained Outcome: Progressing   Problem: Pain Managment: Goal: General experience of comfort will improve Outcome: Progressing   Problem: Safety: Goal: Ability to remain free from injury will improve Outcome: Progressing   Problem: Skin Integrity: Goal: Risk for impaired skin integrity will decrease Outcome: Progressing   

## 2021-02-20 NOTE — Progress Notes (Signed)
ANTICOAGULATION CONSULT NOTE - Follow Up Consult  Pharmacy Consult for Heparin Indication: atrial fibrillation  Allergies  Allergen Reactions  . Codeine Itching  . Bee Venom Other (See Comments)    "Feels like his head is on fire when he goes outside in the sun"  . Hydrocodone-Acetaminophen Other (See Comments)  . Other Other (See Comments)    "Feels like his head is on fire when he goes outside in the sun"  . Cephalexin     No mention of this allergy in notes from 07/2016 admission for osteomyelitis.  Patient was treated with Ancef and Unasyn for broader anaerobic coverage.  Patient is not aware of allergy.  . Vicodin [Hydrocodone-Acetaminophen] Itching  . Zocor [Simvastatin] Other (See Comments)    Myalgias    Patient Measurements: Height: 6\' 3"  (190.5 cm) Weight: 103 kg (227 lb 1.2 oz) IBW/kg (Calculated) : 84.5 Heparin Dosing Weight: 101 kg   Labs: Recent Labs    02/18/21 0121 02/18/21 0940 02/18/21 1837 02/19/21 0511 02/19/21 1414 02/19/21 1629 02/19/21 2229 02/20/21 0457 02/20/21 0731  HGB 13.2  --   --  12.8*  --   --   --  12.4*  --   HCT 39.1  --   --  37.8*  --   --   --  37.7*  --   PLT 199  --   --  183  --   --   --  163  --   HEPARINUNFRC 0.20* 0.21*   < > 0.23* 0.23*  --  0.19*  --  0.14*  CREATININE 1.06 1.06   < > 1.19  --  1.03  --  1.05  --   CKTOTAL  --  45*  --   --   --   --   --   --   --    < > = values in this interval not displayed.    Estimated Creatinine Clearance: 98.5 mL/min (by C-G formula based on SCr of 1.05 mg/dL).  Assessment: 59 y.o.male with medical history of DM, HTN, strokes, and A. Fib on rate control and systemic AC who presented 02/14/21 as a transfer from [facility] ED for evaluation of limb ischemic and septic shock. Per medical record had been on Xarelto, unclear when patient had his last dose. Patient started on Heparin infusion at [facility]. Pharmacy consulted to follow heparin drip.   Now s/p amputation 4/27  with heparin to resume at midnight   Goal of Therapy:  Heparin level 0.3-0.7 units/ml Monitor platelets by anticoagulation protocol: Yes   Plan:  Heparin at 3150 units / hr to start at midnight 8 hour heparin level  Thank you 5/27, PharmD

## 2021-02-20 NOTE — Progress Notes (Signed)
Initial visit with Hyland, his mother Rayma, and his aunt at [facility] bedside immediately following surgery. When Chaplain entered the room patient was moaning and restless and his mother was attempting to settle him.  After pt settled, Chaplain invited Rayma into the hallway to talk.   Chaplain asked open ended questions to facilitate story telling and emotional expression. Pt's mother shared her fear and concern for her son's declining health and battle with addiction.  She named her worry about him being able to care for himself after being discharged. She is hopeful that he'll be willing to move into a mobile home on their family property for ease of care and to create distance between [place] and his triggers. She shared that the medical team said there may be a possibility that Oather will go to a rehab facility and is hopeful that will be the case.  Chaplain affirmed that it may be helpful for Rayma and her husband to have some time to rest before beginning care for him and to receive him further into his recovery process.  As Rayma shared stories of multiple losses in their family, including the estrangement from Eh's daughter Marchelle Folks and the death of his son 10 years ago, and the declining health of her husband, chaplain invited pt to consider ways she practices self care while caring for so many others. Rayma identified finding comfort in prayer and spending time outside. Her sister is also a support to her. Chaplain assisted pt in reframing some interpersonal frustration and affirmed how hard she is working to support her family.  Chaplain provided education on boundary setting and small ways to find moments of self care in the middle of stress.  Pt's mother was somewhat tearful and named how hard things have been, but identified ways she is practicing resilience by taking things one day at a time and identifying spaces for gratitude. Chaplain concluded visit with prayer at pt's request.   Pt's mother would  appreciate follow up support. She does not think she will be able to visit tomorrow, but may return on Friday.  Please page as further needs arise.  Maryanna Shape. Carley Hammed, M.Div. [facility] Chaplain Pager [phone] Office [phone]

## 2021-02-20 NOTE — Progress Notes (Addendum)
PROGRESS NOTE        PATIENT DETAILS Name: Fernando Maddox Age: 60 y.o. Sex: male Date of Birth: [birth year] Admit Date: 02/14/2021 Admitting Physician Charlott Holler, MD PCP:Van Domenic Schwab, MD  Brief Narrative: Patient is a 60 y.o. male with history of HFrEF, permanent atrial fibrillation, CAD, DM-2, known PAD-s/p partial amputation of right great toe presented to [facility] as a transfer from [facility] ED for septic shock in the setting of critical right lower extremity ischemia.  Significant events: 4/21>> transferred to [facility] from [facility]- found down-gangrene is right lower extremity-CT angio performed which showed concern for acute right arterial occlusion. 4/24>> transfer to [facility].  Significant studies: 4/21>> CT angio abdominal aorta with iliofemoral runoff: No opacification beyond mid SFA-suspicious for arterial embolism.  L1, L2, L3 and L4 vertebral bodies compression fractures.  Mildly enlarged left retroperitoneal, pelvic and inguinal lymph nodes. 4/21>> CT chest: No acute pulmonary disease. 4/21>> CT head: No acute intracranial abnormality 4/21>> CT C-spine: No acute fractures. 4/22>> chest x-ray: No active disease 4/22>> right lower extremity arterial duplex: No evidence of stenosis or occlusive disease. 4/22>> Echo: EF 20-25%, RV systolic function severely reduced  Antimicrobial therapy: Vancomycin: 4/21>>4/24 Zosyn: 4/21>> 4/24 Unasyn: 4/25>>  Microbiology data: None  Procedures : 4/27>> left transmetatarsal amputation  Consults: Vascular surgery, PCCM, orthopedics, cardiology  DVT Prophylaxis : IV heparin  Subjective: Lying comfortably in bed-answers simple questions appropriately.  No chest pain or shortness of breath.   Assessment/Plan: Septic shock due to critical left limb ischemia with left forefoot ulceration/gangrene: Sepsis physiology resolved-briefly required pressors while at [facility] health-on IV antibiotics as above-patient is s/p left  transmetatarsal amputation on 4/27.  IV antibiotics will be continued for another 24 hours.  Acute metabolic encephalopathy: Due to above-found unresponsive at home-slowly improving.  Hyponatremia: Suspect due to CHF-probably will require Samsca-currently asymptomatic-watch closely for another day as patient is currently getting IV fluids as he just came back from the OR.  Chronic atrial fibrillation with RVR: Rate controlled-initially required IV amiodarone infusion-now on oral amiodarone-remains on IV heparin.  If no bleeding complication-plans to switch to oral anticoagulant in the next 24 hours.  Per CTA report-suspicion that critical ischemia could have been from an embolic event-unclear how compliant patient has been with anticoagulation in the past.  Acute on chronic systolic/diastolic heart failure/new RV failure: Volume status is improved-cardiology following-defer initiation of CHF medications to cardiology service.  History of CAD: No anginal symptoms-not on aspirin as patient on anticoagulation  DM-2 (A1c 7.7 on 4/25): Monitor closely with SSI.  Follow CBGs closely.  Resume metformin on discharge.  Recent Labs    02/20/21 1115 02/20/21 1258 02/20/21 1447  GLUCAP 78 127* 94   Prior history of polysubstance use: UDS 3 years back positive for amphetamine/cannabinoids.  ?  OSA: Sleep study outpatient.  L  Nutrition Problem: Nutrition Problem: Moderate Malnutrition Etiology: chronic illness (CHF) Signs/Symptoms: mild muscle depletion,moderate muscle depletion,mild fat depletion,moderate fat depletion Interventions: MVI,Glucerna shake,Magic cup   Diet: Diet Order            Diet Carb Modified Fluid consistency: Thin; Room service appropriate? Yes  Diet effective now                  Code Status: Full code   Family Communication: None at bedside  Disposition Plan: Status is: Inpatient  Remains inpatient appropriate because:Inpatient level of care appropriate  due to severity of illness   Dispo: The patient is from: Home              Anticipated d/c is to: tbd              Patient currently is not medically stable to d/c.   Difficult to place patient No   Barriers to Discharge: Recurrent left lower extremity ischemia-s/p transmetatarsal amputation on 4/27-needs IV antibiotics for another 24 hours-needs correction of hyponatremia before consideration of discharge.  Antimicrobial agents: Anti-infectives (From admission, onward)   Start     Dose/Rate Route Frequency Ordered Stop   02/20/21 1100  ceFAZolin (ANCEF) IVPB 2g/100 mL premix        2 g 200 mL/hr over 30 Minutes Intravenous To Short Stay 02/20/21 0754 02/20/21 1254   02/18/21 1745  Ampicillin-Sulbactam (UNASYN) 3 g in sodium chloride 0.9 % 100 mL IVPB        3 g 200 mL/hr over 30 Minutes Intravenous Every 6 hours 02/18/21 1645 02/21/21 2359   02/18/21 1200  metroNIDAZOLE (FLAGYL) IVPB 500 mg  Status:  Discontinued        500 mg 100 mL/hr over 60 Minutes Intravenous Every 8 hours 02/18/21 1033 02/18/21 1043   02/18/21 1130  cefTRIAXone (ROCEPHIN) 2 g in sodium chloride 0.9 % 100 mL IVPB  Status:  Discontinued        2 g 200 mL/hr over 30 Minutes Intravenous Every 24 hours 02/18/21 1033 02/18/21 1043   02/15/21 0230  vancomycin (VANCOREADY) IVPB 1250 mg/250 mL  Status:  Discontinued        1,250 mg 166.7 mL/hr over 90 Minutes Intravenous Every 12 hours 02/15/21 0140 02/18/21 0735   02/15/21 0030  piperacillin-tazobactam (ZOSYN) IVPB 3.375 g  Status:  Discontinued        3.375 g 12.5 mL/hr over 240 Minutes Intravenous Every 8 hours 02/14/21 2338 02/18/21 1032       Time spent: 25 minutes-Greater than 50% of this time was spent in counseling, explanation of diagnosis, planning of further management, and coordination of care.  MEDICATIONS: Scheduled Meds: . amiodarone  200 mg Oral BID  . [START ON 02/21/2021] vitamin C  1,000 mg Oral Daily  . chlorhexidine      . Chlorhexidine  Gluconate Cloth  6 each Topical Daily  . docusate sodium  100 mg Oral BID  . famotidine  20 mg Oral Daily  . feeding supplement (GLUCERNA SHAKE)  237 mL Oral BID BM  . insulin aspart  0-15 Units Subcutaneous TID WC  . insulin aspart  0-5 Units Subcutaneous QHS  . mouth rinse  15 mL Mouth Rinse BID  . multivitamin with minerals  1 tablet Oral Daily  . [START ON 02/21/2021] pantoprazole  40 mg Oral Daily  . zinc sulfate  220 mg Oral Daily   Continuous Infusions: . sodium chloride    . ampicillin-sulbactam (UNASYN) IV 3 g (02/20/21 0516)  . [START ON 02/21/2021] heparin     PRN Meds:.acetaminophen **OR** acetaminophen, albuterol, alum & mag hydroxide-simeth, bisacodyl, guaiFENesin-dextromethorphan, HYDROmorphone (DILAUDID) injection, magnesium citrate, ondansetron **OR** ondansetron (ZOFRAN) IV, phenol, polyethylene glycol, zolpidem   PHYSICAL EXAM: Vital signs: Vitals:   02/20/21 1339 02/20/21 1354 02/20/21 1409 02/20/21 1424  BP: 101/76 106/79 114/77 105/84  Pulse: 84 85 91 (!) 56  Resp: (!) 22 (!) 24 19 15   Temp:    (!) 97 F (36.1 C)  TempSrc:  SpO2: 100% 99% 94% 100%  Weight:      Height:       Filed Weights   02/16/21 0500 02/17/21 0500 02/20/21 0500  Weight: 99.8 kg 101.1 kg 103 kg   Body mass index is 28.38 kg/m.   Gen Exam:Alert awake-not in any distress HEENT:atraumatic, normocephalic Chest: B/L clear to auscultation anteriorly CVS:S1S2 regular Abdomen:soft non tender, non distended Extremities: Left TMA-did not open. Neurology: Non focal Skin: no rash  I have personally reviewed following labs and imaging studies  LABORATORY DATA: CBC: Recent Labs  Lab 02/16/21 0622 02/17/21 0508 02/18/21 0121 02/19/21 0511 02/20/21 0457  WBC 11.4* 11.7* 12.4* 13.4* 14.2*  HGB 12.7* 12.6* 13.2 12.8* 12.4*  HCT 38.7* 37.7* 39.1 37.8* 37.7*  MCV 86.2 84.0 82.1 82.2 84.3  PLT 222 210 199 183 163    Basic Metabolic Panel: Recent Labs  Lab 02/14/21 2127  02/15/21 0250 02/17/21 0508 02/17/21 1604 02/18/21 0121 02/18/21 0940 02/18/21 1837 02/19/21 0511 02/19/21 1629 02/20/21 0457  NA 126*   < > 122* 123* 123* 123* 122* 123* 124* 126*  K 2.9*   < > 3.7 3.2* 3.4* 3.2* 3.9 4.3 4.4 4.1  CL 94*   < > 90* 89* 90* 88* 88* 90* 92* 90*  CO2 24   < > 22 23 20* 25 23 20* 20* 21*  GLUCOSE 167*   < > 110* 157* 102* 126* 220* 127* 155* 86  BUN 12   < > 18 19 17 17 18 17 16 17   CREATININE 0.99   < > 1.14 1.05 1.06 1.06 1.20 1.19 1.03 1.05  CALCIUM 7.9*   < > 8.0* 7.8* 7.9* 7.7* 7.7* 8.0* 8.3* 8.3*  MG 1.8   < > 1.8 1.5* 1.8  --   --  1.7  --  2.0  PHOS 2.8  --  2.1*  --  2.5  --   --   --   --   --    < > = values in this interval not displayed.    GFR: Estimated Creatinine Clearance: 98.5 mL/min (by C-G formula based on SCr of 1.05 mg/dL).  Liver Function Tests: Recent Labs  Lab 02/14/21 2127  AST 20  ALT 23  ALKPHOS 72  BILITOT 1.8*  PROT 5.2*  ALBUMIN 2.6*   No results for input(s): LIPASE, AMYLASE in the last 168 hours. Recent Labs  Lab 02/16/21 0622  AMMONIA 27    Coagulation Profile: Recent Labs  Lab 02/14/21 2127  INR 1.5*    Cardiac Enzymes: Recent Labs  Lab 02/18/21 0940  CKTOTAL 45*    BNP (last 3 results) No results for input(s): PROBNP in the last 8760 hours.  Lipid Profile: Recent Labs    02/18/21 0121  CHOL 80  HDL 15*  LDLCALC 55  TRIG 48  CHOLHDL 5.3    Thyroid Function Tests: No results for input(s): TSH, T4TOTAL, FREET4, T3FREE, THYROIDAB in the last 72 hours.  Anemia Panel: Recent Labs    02/18/21 0940  VITAMINB12 1,338*  FOLATE 22.0    Urine analysis: No results found for: COLORURINE, APPEARANCEUR, LABSPEC, PHURINE, GLUCOSEU, HGBUR, BILIRUBINUR, KETONESUR, PROTEINUR, UROBILINOGEN, NITRITE, LEUKOCYTESUR  Sepsis Labs: Lactic Acid, Venous    Component Value Date/Time   LATICACIDVEN 1.6 02/15/2021 0250    MICROBIOLOGY: Recent Results (from the past 240 hour(s))  Surgical  pcr screen     Status: Abnormal   Collection Time: 02/14/21  8:00 PM   Specimen: Nasal Mucosa; Nasal  Swab  Result Value Ref Range Status   MRSA, PCR NEGATIVE NEGATIVE Final   Staphylococcus aureus POSITIVE (A) NEGATIVE Final    Comment: (NOTE) The Xpert SA Assay (FDA approved for NASAL specimens in patients 58 years of age and older), is one component of a comprehensive surveillance program. It is not intended to diagnose infection nor to guide or monitor treatment. Performed at [facility], [address], [place], [place] [address]   SARS Coronavirus 2 by RT PCR (hospital order, performed in [facility] hospital lab) Nasopharyngeal Nasopharyngeal Swab     Status: None   Collection Time: 02/20/21  8:03 AM   Specimen: Nasopharyngeal Swab  Result Value Ref Range Status   SARS Coronavirus 2 NEGATIVE NEGATIVE Final    Comment: (NOTE) SARS-CoV-2 target nucleic acids are NOT DETECTED.  The SARS-CoV-2 RNA is generally detectable in upper and lower respiratory specimens during the acute phase of infection. The lowest concentration of SARS-CoV-2 viral copies this assay can detect is 250 copies / mL. A negative result does not preclude SARS-CoV-2 infection and should not be used as the sole basis for treatment or other patient management decisions.  A negative result may occur with improper specimen collection / handling, submission of specimen other than nasopharyngeal swab, presence of viral mutation(s) within the areas targeted by this assay, and inadequate number of viral copies (<250 copies / mL). A negative result must be combined with clinical observations, patient history, and epidemiological information.  Fact Sheet for Patients:   BoilerBrush.com.cy  Fact Sheet for Healthcare Providers: https://pope.com/  This test is not yet approved or  cleared by the [place] FDA and has been authorized for detection and/or  diagnosis of SARS-CoV-2 by FDA under an Emergency Use Authorization (EUA).  This EUA will remain in effect (meaning this test can be used) for the duration of the COVID-19 declaration under Section 564(b)(1) of the Act, 21 U.S.C. section 360bbb-3(b)(1), unless the authorization is terminated or revoked sooner.  Performed at [facility], [address] [place], [place] [address]     RADIOLOGY STUDIES/RESULTS: DG CHEST PORT 1 VIEW  Result Date: 02/18/2021 CLINICAL DATA:  Shortness of breath, weakness, and fatigue. EXAM: PORTABLE CHEST 1 VIEW COMPARISON:  02/15/2021 FINDINGS: The right central venous catheter has been removed. Cardiac enlargement. No vascular congestion, edema, or consolidation. No pleural effusions. No pneumothorax. Mediastinal contours appear intact. Old bilateral rib fractures. IMPRESSION: Cardiac enlargement.  No evidence of active pulmonary disease. Electronically Signed   By: Burman Nieves M.D.   On: 02/18/2021 20:25     LOS: 6 days   Jeoffrey Massed, MD  [facility]    To contact the attending provider between 7A-7P or the covering provider during after hours 7P-7A, please log into the web site [email or web address] and access using universal [place] password for that web site. If you do not have the password, please call the hospital operator.  02/20/2021, 3:20 PM

## 2021-02-20 NOTE — Anesthesia Procedure Notes (Addendum)
Anesthesia Regional Block: Popliteal block   Pre-Anesthetic Checklist: ,, timeout performed, Correct Patient, Correct Site, Correct Laterality, Correct Procedure, Correct Position, site marked, Risks and benefits discussed,  Surgical consent,  Pre-op evaluation,  At surgeon's request and post-op pain management  Laterality: Left  Prep: chloraprep       Needles:  Injection technique: Single-shot  Needle Type: Echogenic Stimulator Needle     Needle Length: 9cm  Needle Gauge: 21     Additional Needles:   Procedures:,,,, ultrasound used (permanent image in chart),,,,  Narrative:  Start time: 02/20/2021 11:21 AM End time: 02/20/2021 11:26 AM Injection made incrementally with aspirations every 5 mL.  Performed by: Personally  Anesthesiologist: Shelton Silvas, MD  Additional Notes: Patient tolerated the procedure well. Local anesthetic introduced in an incremental fashion under minimal resistance after negative aspirations. No paresthesias were elicited. After completion of the procedure, no acute issues were identified and patient continued to be monitored by RN.

## 2021-02-20 NOTE — Interval H&P Note (Signed)
History and Physical Interval Note:  02/20/2021 6:48 AM  Fernando Maddox  has presented today for surgery, with the diagnosis of Gangrene Left Foot.  The various methods of treatment have been discussed with the patient and family. After consideration of risks, benefits and other options for treatment, the patient has consented to  Procedure(s): LEFT TRANSMETATARSAL AMPUTATION (Left) as a surgical intervention.  The patient's history has been reviewed, patient examined, no change in status, stable for surgery.  I have reviewed the patient's chart and labs.  Questions were answered to the patient's satisfaction.     [name]

## 2021-02-20 NOTE — Progress Notes (Signed)
Upon reviewing chart for surgery today, did not see Covid test in Epic. Called to floor to see if patient had one on file. Belenda Cruise, RN stated she did not see anything in Epic or his paper chart. Ordered Covid test per anesthesia protocol. RN stated she would obtain sample and send to lab.

## 2021-02-20 NOTE — Consult Note (Signed)
Consultation Note Date: 02/20/2021   Patient Name: Fernando Maddox  DOB: 22-Nov-1960  MRN: [id number]  Age / Sex: 60 y.o., male  PCP: Fernando Roger, MD Referring Physician: Jonetta Osgood, MD  Reason for Consultation: Establishing goals of care  HPI/Patient Profile: 60 y.o. male  with past medical history of diabetes, CAD, heart failure EF 20-25%, severe RV failure, hypertension, stroke, atrial fibrillation, polysubstance abuse admitted on 02/14/2021 with septic shock with shortness of breath found down by family at his home. Found to have left lower leg gangrene with plans for left metatarsal amputation.   Clinical Assessment and Goals of Care: I met today at [facility] bedside along with his mother and aunt. Fernando Maddox is sleepy and lethargic and is not really able to participate in conversation. He does answer simple questions when asked directly but with very brief answers and does not expand or explain. I spoke with mother privately and we discussed the severity of Fernando Maddox's condition with multiple serious comorbidities. She shares that her husband (Fernando Maddox's father) has many health issues with his heart and neuropathy as well. She shares that Fernando Maddox has been actively abusing meth over ~4 years and this has been escalating. She shares that she can tell even over the phone when he is "messed up." She describes a cyclic pattern when he seems good for a while and then will have 3-4 days when he is "messed up." She and her husband try to help him to get groceries/food, etc as much as they can but they also live farther away between [place] and [place]. She shares with me that they have tried to help Juniel but his situation weighs greatly on her. We discussed that there are certain things out of our control and that we can all try our best to support Burle and get him resources to treat his medical issues and addiction but he also has to  be willing to want to get better.   Mother shares with me about her faith and it is important to her that Fernando Maddox is saved spiritually. She knows that without significant changes in his lifestyle that Hiro will not live much longer. They have not really had discussions regarding end of life outside of her asking if Fernando Maddox desired to be buried or cremated but he was unable to answer. They have not discussed any further about end of life. She does share with me that Fernando Maddox has in the past expressed suicidal ideation but has never acted on these thoughts. She reports that this has not occurred recently. She would be open to psychiatric consultation for [name].   I discussed my concern about the future and his willingness to accept the help he requires to prolong his life. If he insists on returning back to his home I am worried about wound healing and infection. Mother anticipates that he will go to short term rehab and then come live in a trailer that they have on their property. She is hopeful that he can make changes to his  lifestyle to improve both his quality and quantity of life.   All questions/concerns addressed. Emotional support provided.   Primary Decision Maker NEXT OF KIN parents (he does have an estranged adult daughter but she is not involved in his care or life)    [place] that he will have lifestyle changes - Hopeful that he will have improved quality and quantity of life  Code Status/Advance Care Planning:  Full code   Symptom Management:   Per attending.   Palliative Prophylaxis:   Aspiration, Bowel Regimen, Delirium Protocol, Frequent Pain Assessment and Turn Reposition  Additional Recommendations (Limitations, Scope, Preferences):  Full Scope Treatment  Psycho-social/Spiritual:   Desire for further Chaplaincy support:yes  Additional Recommendations: Caregiving  Support/Resources and Referral to Community Resources   Prognosis:   Overall  prognosis poor with advanced comorbidities and poor compliance.   Discharge Planning: To Be Determined      Primary Diagnoses: Present on Admission: . Septic shock ([place])   I have reviewed the medical record, interviewed the patient and family, and examined the patient. The following aspects are pertinent.  Past Medical History:  Diagnosis Date  . Acute combined systolic and diastolic CHF, NYHA class 1 ([place]) 08/07/2016  . Anemia   . Atrial fibrillation ([place])   . BACK PAIN 02/11/2010   Qualifier: Diagnosis of  By: Owens Shark, RN, BSN, Lauren    . CAD (coronary artery disease) 02/11/2010   Qualifier: Diagnosis of  By: Owens Shark, RN, BSN, Lauren    . Cellulitis 09/29/2017  . Cerebrovascular disease   . CHEST PAIN 02/11/2010   Qualifier: Diagnosis of  By: Owens Shark, RN, BSN, Lauren    . Chronic anticoagulation 03/25/2018  . Chronic pain syndrome   . Chronic systolic congestive heart failure ([place]) 03/25/2018  . Community acquired pneumonia 08/11/2016  . DEPRESSION/ANXIETY 02/11/2010   Qualifier: Diagnosis of  By: Owens Shark, RN, BSN, Lauren    . Diabetes mellitus, type 2 ([place]) 02/11/2010   Qualifier: Diagnosis of  By: Owens Shark, RN, BSN, Lauren    . Diabetic ulcer of toe of right foot associated with type 2 diabetes mellitus ([place]) 10/01/2017  . ESOPHAGEAL STRICTURE 02/11/2010   Qualifier: Diagnosis of  By: Owens Shark, RN, BSN, Lauren    . GERD (gastroesophageal reflux disease)   . HEADACHE 02/11/2010   Qualifier: Diagnosis of  By: Owens Shark, RN, BSN, Lauren    . History of CVA (cerebrovascular accident) 09/29/2017  . History of drug abuse ([place]) 08/05/2016  . Hypercholesteremia   . Hyperlipemia 02/11/2010   Qualifier: Diagnosis of  By: Owens Shark, RN, BSN, Lauren    . Hyperlipidemia   . Hypertension 02/11/2010   Qualifier: Diagnosis of  By: Owens Shark, RN, BSN, Lauren    . HYPERTENSION, UNSPECIFIED 02/11/2010   Qualifier: Diagnosis of  By: Owens Shark, RN, BSN, Lauren    . Hypogonadism male   . Late effects of CVA (cerebrovascular  accident) 01/19/2017   Formatting of this note might be different from the original. Status post thrombectomy  . Osteoarthritis 08/05/2016  . Osteomyelitis, unspecified ([place]) 10/06/2017  . PAF (paroxysmal atrial fibrillation) ([place]) 08/05/2016  . Polysubstance abuse ([place]) 09/29/2017  . Rotator cuff syndrome    Shoulder  . Septic shock ([place]) 02/14/2021  . Shoulder pain   . SIADH (syndrome of inappropriate ADH production) ([place]) 08/07/2016  . Sleep apnea   . Stroke ([place]) 08/05/2016  . Tobacco use 08/11/2016  . Ulcer of left foot with necrosis of bone ([place])  Social History   Socioeconomic History  . Marital status: Married    Spouse name: Not on file  . Number of children: Not on file  . Years of education: Not on file  . Highest education level: Not on file  Occupational History  . Not on file  Tobacco Use  . Smoking status: Former Smoker    Types: Cigarettes    Start date: 11/06/2016  . Smokeless tobacco: Former Systems developer    Quit date: 03/06/2016  Substance and Sexual Activity  . Alcohol use: Not on file  . Drug use: Not on file  . Sexual activity: Not on file  Other Topics Concern  . Not on file  Social History Narrative  . Not on file   Social Determinants of Health   Financial Resource Strain: Not on file  Food Insecurity: Not on file  Transportation Needs: Not on file  Physical Activity: Not on file  Stress: Not on file  Social Connections: Not on file   Family History  Problem Relation Age of Onset  . Hypertension Mother   . Hypertension Father    Scheduled Meds: . amiodarone  200 mg Oral BID  . Chlorhexidine Gluconate Cloth  6 each Topical Daily  . docusate sodium  100 mg Oral BID  . famotidine  20 mg Oral Daily  . feeding supplement (GLUCERNA SHAKE)  237 mL Oral BID BM  . insulin aspart  0-15 Units Subcutaneous TID WC  . insulin aspart  0-5 Units Subcutaneous QHS  . mouth rinse  15 mL Mouth Rinse BID  . multivitamin with minerals  1 tablet Oral Daily    Continuous Infusions: . ampicillin-sulbactam (UNASYN) IV 3 g (02/20/21 0516)  .  ceFAZolin (ANCEF) IV    . heparin Stopped (02/20/21 0917)   PRN Meds:.acetaminophen **OR** acetaminophen, albuterol, ondansetron **OR** ondansetron (ZOFRAN) IV, polyethylene glycol, zolpidem Allergies  Allergen Reactions  . Codeine Itching  . Bee Venom Other (See Comments)    "Feels like his head is on fire when he goes outside in the sun"  . Hydrocodone-Acetaminophen Other (See Comments)  . Other Other (See Comments)    "Feels like his head is on fire when he goes outside in the sun"  . Cephalexin     No mention of this allergy in notes from 07/2016 admission for osteomyelitis.  Patient was treated with Ancef and Unasyn for broader anaerobic coverage.  Patient is not aware of allergy.  . Vicodin [Hydrocodone-Acetaminophen] Itching  . Zocor [Simvastatin] Other (See Comments)    Myalgias   Review of Systems  Unable to perform ROS: Acuity of condition    Physical Exam Vitals and nursing note reviewed.  Constitutional:      General: He is not in acute distress.    Appearance: He is ill-appearing.  Cardiovascular:     Rate and Rhythm: Normal rate.  Pulmonary:     Effort: Pulmonary effort is normal. No tachypnea, accessory muscle usage or respiratory distress.  Abdominal:     General: Abdomen is flat.  Neurological:     Mental Status: He is lethargic.     Comments: He does not really engage in conversation. Difficult to know if he is confused or opting not to participate. He does not seem to be fully aware of the situation.      Vital Signs: BP 103/84 (BP Location: Right Arm)   Pulse 81   Temp 97.7 F (36.5 C) (Oral)   Resp (!) 21   Ht 6'  3" (1.905 m)   Wt 103 kg   SpO2 91%   BMI 28.38 kg/m  Pain Scale: 0-10 POSS *See Group Information*: 1-Acceptable,Awake and alert Pain Score: 7    SpO2: SpO2: 91 % O2 Device:SpO2: 91 % O2 Flow Rate: .O2 Flow Rate (L/min): 4 L/min  IO:  Intake/output summary:   Intake/Output Summary (Last 24 hours) at 02/20/2021 0944 Last data filed at 02/20/2021 0604 Gross per 24 hour  Intake 1998.4 ml  Output 400 ml  Net 1598.4 ml    LBM: Last BM Date: 02/17/21 Baseline Weight: Weight: 90.7 kg Most recent weight: Weight: 103 kg     Palliative Assessment/Data:     Time In: 0930 Time Out: 1040 Time Total: 70 min Greater than 50%  of this time was spent counseling and coordinating care related to the above assessment and plan.  Signed by: Vinie Sill, NP Palliative Medicine Team Pager # [phone] (M-F 8a-5p) Team Phone # [phone] (Nights/Weekends)

## 2021-02-20 NOTE — Anesthesia Postprocedure Evaluation (Signed)
Anesthesia Post Note  Patient: Fernando Maddox  Procedure(s) Performed: LEFT TRANSMETATARSAL AMPUTATION (Left Foot)     Patient location during evaluation: PACU Anesthesia Type: Regional Level of consciousness: awake and alert Pain management: pain level controlled Vital Signs Assessment: post-procedure vital signs reviewed and stable Respiratory status: spontaneous breathing, nonlabored ventilation, respiratory function stable and patient connected to nasal cannula oxygen Cardiovascular status: stable and blood pressure returned to baseline Postop Assessment: no apparent nausea or vomiting Anesthetic complications: no   No complications documented.  Last Vitals:  Vitals:   02/20/21 1424 02/20/21 1500  BP: 105/84 102/78  Pulse: (!) 56 87  Resp: 15 17  Temp: (!) 36.1 C   SpO2: 100% 97%    Last Pain:  Vitals:   02/20/21 1424  TempSrc:   PainSc: 0-No pain                 Shelton Silvas

## 2021-02-20 NOTE — Anesthesia Procedure Notes (Signed)
Anesthesia Regional Block: Adductor canal block   Pre-Anesthetic Checklist: ,, timeout performed, Correct Patient, Correct Site, Correct Laterality, Correct Procedure, Correct Position, site marked, Risks and benefits discussed,  Surgical consent,  Pre-op evaluation,  At surgeon's request and post-op pain management  Laterality: Left  Prep: chloraprep       Needles:  Injection technique: Single-shot  Needle Type: Echogenic Stimulator Needle     Needle Length: 9cm  Needle Gauge: 21     Additional Needles:   Procedures:,,,, ultrasound used (permanent image in chart),,,,  Narrative:  Start time: 02/20/2021 11:26 AM End time: 02/20/2021 11:31 AM Injection made incrementally with aspirations every 5 mL.  Performed by: Personally  Anesthesiologist: Shelton Silvas, MD  Additional Notes: Patient tolerated the procedure well. Local anesthetic introduced in an incremental fashion under minimal resistance after negative aspirations. No paresthesias were elicited. After completion of the procedure, no acute issues were identified and patient continued to be monitored by RN.

## 2021-02-20 NOTE — Transfer of Care (Signed)
Immediate Anesthesia Transfer of Care Note  Patient: Fernando Maddox  Procedure(s) Performed: LEFT TRANSMETATARSAL AMPUTATION (Left Foot)  Patient Location: PACU  Anesthesia Type:MAC and Regional  Level of Consciousness: awake and drowsy  Airway & Oxygen Therapy: Patient Spontanous Breathing and Patient connected to face mask oxygen  Post-op Assessment: Report given to RN and Post -op Vital signs reviewed and stable  Post vital signs: Reviewed and stable  Last Vitals:  Vitals Value Taken Time  BP 112/100 02/20/21 1254  Temp    Pulse 101 02/20/21 1256  Resp 25 02/20/21 1256  SpO2 100 % 02/20/21 1256  Vitals shown include unvalidated device data.  Last Pain:  Vitals:   02/20/21 1145  TempSrc:   PainSc: 0-No pain      Patients Stated Pain Goal: 0 (96/75/91 6384)  Complications: No complications documented.

## 2021-02-20 NOTE — Anesthesia Procedure Notes (Signed)
Procedure Name: MAC Date/Time: 02/20/2021 12:16 PM Performed by: Trinna Post., CRNA Pre-anesthesia Checklist: Patient identified, Emergency Drugs available, Suction available, Patient being monitored and Timeout performed Patient Re-evaluated:Patient Re-evaluated prior to induction Oxygen Delivery Method: Nasal cannula Preoxygenation: Pre-oxygenation with 100% oxygen Induction Type: IV induction Placement Confirmation: positive ETCO2

## 2021-02-20 NOTE — Progress Notes (Signed)
Patient off of unit at this time for surgery. This nurse accompanied patient down to pre-op so that patient could remain on tele monitor due to a-fib with tendencies of RVR. Report given at bedside to Candelaria Stagers, RN.

## 2021-02-20 NOTE — Anesthesia Preprocedure Evaluation (Addendum)
Anesthesia Evaluation  Patient identified by MRN, date of birth, ID band Patient awake    Reviewed: Allergy & Precautions, NPO status , Patient's Chart, lab work & pertinent test results  Airway Mallampati: II  TM Distance: >3 FB Neck ROM: Full    Dental   Pulmonary sleep apnea , former smoker,     + decreased breath sounds      Cardiovascular hypertension, Pt. on home beta blockers + CAD and +CHF  + dysrhythmias Atrial Fibrillation  Rhythm:Regular Rate:Normal     Neuro/Psych  Headaches, PSYCHIATRIC DISORDERS Depression CVA    GI/Hepatic Neg liver ROS, GERD  ,  Endo/Other  diabetes, Type 2, Oral Hypoglycemic Agents  Renal/GU negative Renal ROS  negative genitourinary   Musculoskeletal  (+) Arthritis ,   Abdominal Normal abdominal exam  (+)   Peds  Hematology   Anesthesia Other Findings   Reproductive/Obstetrics                            Anesthesia Physical Anesthesia Plan  ASA: IV  Anesthesia Plan: Regional   Post-op Pain Management:    Induction: Intravenous  PONV Risk Score and Plan: 3 and Ondansetron, Propofol infusion and Midazolam  Airway Management Planned: Simple Face Mask and Natural Airway  Additional Equipment: None  Intra-op Plan:   Post-operative Plan:   Informed Consent: I have reviewed the patients History and Physical, chart, labs and discussed the procedure including the risks, benefits and alternatives for the proposed anesthesia with the patient or authorized representative who has indicated his/her understanding and acceptance.     Dental advisory given  Plan Discussed with: CRNA  Anesthesia Plan Comments: (Lab Results      Component                Value               Date                      WBC                      14.2 (H)            02/20/2021                HGB                      12.4 (L)            02/20/2021                HCT                       37.7 (L)            02/20/2021                MCV                      84.3                02/20/2021                PLT                      163  02/20/2021            Echo: 1. Left ventricular ejection fraction, by estimation, is 20 to 25%. The  left ventricle has severely decreased function. The left ventricle  demonstrates global hypokinesis. The left ventricular internal cavity size  was moderately dilated. Left  ventricular diastolic function could not be evaluated. There is the  interventricular septum is flattened in systole and diastole, consistent  with right ventricular pressure and volume overload.  2. Right ventricular systolic function is severely reduced. The right  ventricular size is moderately enlarged.  3. Left atrial size was moderately dilated.  4. Right atrial size was moderately dilated.  5. The mitral valve is normal in structure. Mild to moderate mitral valve  regurgitation.  6. Tricuspid valve regurgitation is mild to moderate.  7. The aortic valve is tricuspid. There is mild calcification of the  aortic valve. Aortic valve regurgitation is not visualized.  8. Aortic dilatation noted. There is borderline dilatation of the  ascending aorta, measuring 40 mm.  9. The inferior vena cava is dilated in size with <50% respiratory  variability, suggesting right atrial pressure of 15 mmHg. )      Anesthesia Quick Evaluation

## 2021-02-21 ENCOUNTER — Ambulatory Visit: Payer: Medicare Other | Admitting: Cardiology

## 2021-02-21 ENCOUNTER — Encounter (HOSPITAL_COMMUNITY): Payer: Self-pay | Admitting: Orthopedic Surgery

## 2021-02-21 DIAGNOSIS — I5021 Acute systolic (congestive) heart failure: Secondary | ICD-10-CM | POA: Diagnosis not present

## 2021-02-21 LAB — BASIC METABOLIC PANEL
Anion gap: 20 — ABNORMAL HIGH (ref 5–15)
BUN: 20 mg/dL (ref 6–20)
CO2: 12 mmol/L — ABNORMAL LOW (ref 22–32)
Calcium: 8.2 mg/dL — ABNORMAL LOW (ref 8.9–10.3)
Chloride: 95 mmol/L — ABNORMAL LOW (ref 98–111)
Creatinine, Ser: 1.07 mg/dL (ref 0.61–1.24)
GFR, Estimated: >60 mL/min (ref >60)
Glucose, Bld: 103 mg/dL — ABNORMAL HIGH (ref 70–99)
Potassium: 5.3 mmol/L — ABNORMAL HIGH (ref 3.5–5.1)
Sodium: 127 mmol/L — ABNORMAL LOW (ref 135–145)

## 2021-02-21 LAB — CBC
HCT: 37.7 % — ABNORMAL LOW (ref 39.0–52.0)
Hemoglobin: 12.6 g/dL — ABNORMAL LOW (ref 13.0–17.0)
MCH: 27.8 pg (ref 26.0–34.0)
MCHC: 33.4 g/dL (ref 30.0–36.0)
MCV: 83.2 fL (ref 80.0–100.0)
Platelets: 140 10*3/uL — ABNORMAL LOW (ref 150–400)
RBC: 4.53 MIL/uL (ref 4.22–5.81)
RDW: 17.1 % — ABNORMAL HIGH (ref 11.5–15.5)
WBC: 13 10*3/uL — ABNORMAL HIGH (ref 4.0–10.5)
nRBC: 0 % (ref 0.0–0.2)

## 2021-02-21 LAB — GLUCOSE, CAPILLARY
Glucose-Capillary: 88 mg/dL (ref 70–99)
Glucose-Capillary: 87 mg/dL (ref 70–99)
Glucose-Capillary: 149 mg/dL — ABNORMAL HIGH (ref 70–99)
Glucose-Capillary: 159 mg/dL — ABNORMAL HIGH (ref 70–99)

## 2021-02-21 LAB — MAGNESIUM: Magnesium: 2.1 mg/dL (ref 1.7–2.4)

## 2021-02-21 LAB — HEPARIN LEVEL (UNFRACTIONATED): Heparin Unfractionated: <0.1 IU/mL — ABNORMAL LOW (ref 0.30–0.70)

## 2021-02-21 MED ORDER — RIVAROXABAN 20 MG PO TABS
20.0000 mg | ORAL_TABLET | Freq: Every day | ORAL | Status: DC
  Administered 2021-02-21 – 2021-02-26 (×6): 20 mg via ORAL
  Filled 2021-02-21 (×6): qty 1

## 2021-02-21 MED ORDER — OXYCODONE-ACETAMINOPHEN 5-325 MG PO TABS
1.0000 | ORAL_TABLET | Freq: Four times a day (QID) | ORAL | Status: DC | PRN
Start: 2021-02-21 — End: 2021-03-09
  Administered 2021-02-21 – 2021-03-09 (×22): 1 via ORAL
  Filled 2021-02-21 (×22): qty 1

## 2021-02-21 MED ORDER — SODIUM ZIRCONIUM CYCLOSILICATE 10 G PO PACK
10.0000 g | PACK | Freq: Two times a day (BID) | ORAL | Status: AC
  Administered 2021-02-21 (×2): 10 g via ORAL
  Filled 2021-02-21 (×2): qty 1

## 2021-02-21 MED ORDER — FUROSEMIDE 10 MG/ML IJ SOLN
40.0000 mg | Freq: Once | INTRAMUSCULAR | Status: AC
  Administered 2021-02-21: 40 mg via INTRAVENOUS
  Filled 2021-02-21: qty 4

## 2021-02-21 NOTE — Progress Notes (Addendum)
Progress Note  Patient Name: Fernando Maddox Date of Encounter: 02/21/2021  [facility] Cardiologist: Garwin Brothers, MD   Subjective   Post-op day 1 transmetatarsal amputation with wound VAC,. Patient is sleepy but responsive. Denies chest pain or SOB.   Inpatient Medications    Scheduled Meds: . amiodarone  200 mg Oral BID  . vitamin C  1,000 mg Oral Daily  . Chlorhexidine Gluconate Cloth  6 each Topical Daily  . docusate sodium  100 mg Oral BID  . famotidine  20 mg Oral Daily  . feeding supplement (GLUCERNA SHAKE)  237 mL Oral BID BM  . insulin aspart  0-15 Units Subcutaneous TID WC  . insulin aspart  0-5 Units Subcutaneous QHS  . mouth rinse  15 mL Mouth Rinse BID  . multivitamin with minerals  1 tablet Oral Daily  . pantoprazole  40 mg Oral Daily  . sodium zirconium cyclosilicate  10 g Oral BID  . zinc sulfate  220 mg Oral Daily   Continuous Infusions: . sodium chloride 10 mL/hr at 02/21/21 0810  . ampicillin-sulbactam (UNASYN) IV 3 g (02/21/21 0520)  . heparin 3,150 Units/hr (02/21/21 0010)   PRN Meds: acetaminophen **OR** acetaminophen, albuterol, alum & mag hydroxide-simeth, bisacodyl, guaiFENesin-dextromethorphan, HYDROmorphone (DILAUDID) injection, magnesium citrate, ondansetron **OR** ondansetron (ZOFRAN) IV, phenol, polyethylene glycol, zolpidem   Vital Signs    Vitals:   02/20/21 2300 02/21/21 0000 02/21/21 0436 02/21/21 0756  BP:  116/87  105/74  Pulse:  87  89  Resp: (!) 21 (!) 21  20  Temp:  97.7 F (36.5 C)  (!) 97.3 F (36.3 C)  TempSrc:  Axillary  Axillary  SpO2:  99%  92%  Weight:   111.4 kg   Height:        Intake/Output Summary (Last 24 hours) at 02/21/2021 0837 Last data filed at 02/21/2021 0520 Gross per 24 hour  Intake 920 ml  Output 565 ml  Net 355 ml   Last 3 Weights 02/21/2021 02/20/2021 02/17/2021  Weight (lbs) 245 lb 9.5 oz 227 lb 1.2 oz 222 lb 14.2 oz  Weight (kg) 111.4 kg 103 kg 101.1 kg      Telemetry    Afib HR  80-90s - Personally Reviewed  ECG    No new - Personally Reviewed  Physical Exam   GEN: No acute distress.   Neck: minimal JVD Cardiac: RRR, no murmurs, rubs, or gallops.  Respiratory: Clear to auscultation bilaterally. GI: Soft, nontender, non-distended  MS: trace lower leg edema; No deformity. Neuro:  Nonfocal  Psych: Normal affect   Labs    High Sensitivity Troponin:  No results for input(s): TROPONINIHS in the last 720 hours.    Chemistry Recent Labs  Lab 02/14/21 2127 02/15/21 0250 02/19/21 1629 02/20/21 0457 02/21/21 0124  NA 126*   < > 124* 126* 127*  K 2.9*   < > 4.4 4.1 5.3*  CL 94*   < > 92* 90* 95*  CO2 24   < > 20* 21* 12*  GLUCOSE 167*   < > 155* 86 103*  BUN 12   < > 16 17 20   CREATININE 0.99   < > 1.03 1.05 1.07  CALCIUM 7.9*   < > 8.3* 8.3* 8.2*  PROT 5.2*  --   --   --   --   ALBUMIN 2.6*  --   --   --   --   AST 20  --   --   --   --  ALT 23  --   --   --   --   ALKPHOS 72  --   --   --   --   BILITOT 1.8*  --   --   --   --   GFRNONAA >60   < > >60 >60 >60  ANIONGAP 8   < > 12 15 20*   < > = values in this interval not displayed.     Hematology Recent Labs  Lab 02/19/21 0511 02/20/21 0457 02/21/21 0124  WBC 13.4* 14.2* 13.0*  RBC 4.60 4.47 4.53  HGB 12.8* 12.4* 12.6*  HCT 37.8* 37.7* 37.7*  MCV 82.2 84.3 83.2  MCH 27.8 27.7 27.8  MCHC 33.9 32.9 33.4  RDW 16.6* 16.9* 17.1*  PLT 183 163 140*    BNPNo results for input(s): BNP, PROBNP in the last 168 hours.   DDimer No results for input(s): DDIMER in the last 168 hours.   Radiology    No results found.  Cardiac Studies    Echocardiogram 02/15/21: 1. Left ventricular ejection fraction, by estimation, is 20 to 25%. The  left ventricle has severely decreased function. The left ventricle  demonstrates global hypokinesis. The left ventricular internal cavity size  was moderately dilated. Left  ventricular diastolic function could not be evaluated. There is the   interventricular septum is flattened in systole and diastole, consistent  with right ventricular pressure and volume overload.  2. Right ventricular systolic function is severely reduced. The right  ventricular size is moderately enlarged.  3. Left atrial size was moderately dilated.  4. Right atrial size was moderately dilated.  5. The mitral valve is normal in structure. Mild to moderate mitral valve  regurgitation.  6. Tricuspid valve regurgitation is mild to moderate.  7. The aortic valve is tricuspid. There is mild calcification of the  aortic valve. Aortic valve regurgitation is not visualized.  8. Aortic dilatation noted. There is borderline dilatation of the  ascending aorta, measuring 40 mm.  9. The inferior vena cava is dilated in size with <50% respiratory  variability, suggesting right atrial pressure of 15 mmHg.   Patient Profile     60 y.o. male with pmh of CAD (possible prior stentint), permanent afib chronic combined CHF, HTN, HLD, DM2, PAD s/p partial great toe amputation RLA, CVA, OSA on CPAP, toabacco use who ise being seen for CHF and afib. transferred from [facility].  Assessment & Plan   Acute on chronic combined CHF with new RV failure - Unknown etiology of CM, possible tachy-mediated with h/o afib - echo this admission showed EF 20-25% with severely reduced RV systolic function inially requiring pressor support for septic shock.  - IV lasix, last does 4/24 - PTA metoprolol and spiro, Bps still soft, resume when able. - On exam still appears relatively euvolemic, breathing is stable. Net UOP -4.6 to now -2.6L, so likely has some extra fluid. Consider restarting home lasix 40 mg daily  Permanent Afib - Amio IV>PO. Plan for amio 200mg  BID x 2 weeks then 200mg  daily - Xarelto held for surgery, restart when safe if there are no more procedure planned - Rates controlled in the 80-90s  Septic shock in the setting of gangrenous LLE Left limb ischemia - was  breifly on pressors, now off - s/p left transmetatarsal 4/27 - IV abx per primary team  CAD with possible prior stenting - Not on ASA due to DOAC  DM2 - A1C 7.7 this admission - per primary team  PAD - s/p amputation - per ortho team  For questions or updates, please contact [facility] Please consult [email or web address] for contact info under        Signed, Cadence David Stall, PA-C  02/21/2021, 8:37 AM    Personally seen and examined. Agree with above.   Still a bit sleepy, somewhat altered  Transmetatarsal amputation with wound VAC  Permanent atrial fibrillation on p.o. amiodarone.  Continue.  Well rate controlled. -Xarelto ordered for evening.  Watch for any signs of bleeding.  Wound VAC in place.  Appears to be relatively euvolemic without any significant breathing difficulties.  Spoke with Dr. Jerral Ralph.  With serum sodium being low, 127, he will administer Lasix.  I think this is quite reasonable.  Donato Schultz, MD

## 2021-02-21 NOTE — Progress Notes (Signed)
PROGRESS NOTE        PATIENT DETAILS Name: Fernando Maddox Age: 60 y.o. Sex: male Date of Birth: [birth year] Admit Date: 02/14/2021 Admitting Physician Fernando Holler, MD PCP:Fernando Domenic Schwab, MD  Brief Narrative: Patient is a 60 y.o. male with history of HFrEF, permanent atrial fibrillation, CAD, DM-2, known PAD-s/p partial amputation of right great toe presented to [facility] as a transfer from [facility] ED for septic shock in the setting of critical right lower extremity ischemia.  Significant events: 4/21>> transferred to [facility] from [facility]- found down-gangrene is right lower extremity-CT angio performed which showed concern for acute right arterial occlusion. 4/24>> transfer to [facility].  Significant studies: 4/21>> CT angio abdominal aorta with iliofemoral runoff: No opacification beyond mid SFA-suspicious for arterial embolism.  L1, L2, L3 and L4 vertebral bodies compression fractures.  Mildly enlarged left retroperitoneal, pelvic and inguinal lymph nodes. 4/21>> CT chest: No acute pulmonary disease. 4/21>> CT head: No acute intracranial abnormality 4/21>> CT C-spine: No acute fractures. 4/22>> chest x-ray: No active disease 4/22>> right lower extremity arterial duplex: No evidence of stenosis or occlusive disease. 4/22>> Echo: EF 20-25%, RV systolic function severely reduced  Antimicrobial therapy: Vancomycin: 4/21>>4/24 Zosyn: 4/21>> 4/24 Unasyn: 4/25>>4/28  Microbiology data: None  Procedures : 4/27>> left transmetatarsal amputation  Consults: Vascular surgery, PCCM, orthopedics, cardiology  DVT Prophylaxis : rivaroxaban (XARELTO) tablet 20 mg IV heparin  Subjective: Sleepy this morning-but awoke-answer most of my questions appropriately.   Assessment/Plan: Septic shock due to critical left limb ischemia with left forefoot ulceration/gangrene: Sepsis physiology resolved-briefly required pressors while at [facility] health-evaluated by orthopedics-patient is  s/p left transmetatarsal amputation on 4/27.  IV antibiotics will be discontinued on 4/28.  Somewhat drowsy this morning-minimize IV narcotics as much as possible-we will see if pain can be controlled with just oral narcotics.  Per orthopedics-okay to be touchdown weightbearing on his heel.  Acute metabolic encephalopathy: Due to above-found unresponsive at home-slowly improving.  Hyponatremia: Suspect due to CHF-we will place on fluid restriction-and restart IV Lasix.  Hyperkalemia: Mild-on Lokelma-we will also give IV Lasix-recheck electrolytes tomorrow.  Chronic atrial fibrillation with RVR: Rate controlled-initially required IV amiodarone infusion-now on oral amiodarone-remains on IV heparin-now that he is postoperative-okay to switch to oral anticoagulant. Per CTA report-suspicion that critical ischemia could have been from an embolic event-unclear how compliant patient has been with anticoagulation in the past.  Acute on chronic systolic/diastolic heart failure/new RV failure: Volume status is improved-cardiology following-defer initiation of CHF medications to cardiology service.   History of CAD: No anginal symptoms-not on aspirin as patient on anticoagulation  DM-2 (A1c 7.7 on 4/25): Monitor closely with SSI.  Follow CBGs closely.  Resume metformin on discharge.  Recent Labs    02/20/21 2032 02/21/21 0801 02/21/21 1209  GLUCAP 73 88 87   Prior history of polysubstance use: UDS 3 years back positive for amphetamine/cannabinoids.  ?  OSA: Sleep study outpatient.   Nutrition Problem: Nutrition Problem: Moderate Malnutrition Etiology: chronic illness (CHF) Signs/Symptoms: mild muscle depletion,moderate muscle depletion,mild fat depletion,moderate fat depletion Interventions: MVI,Glucerna shake,Magic cup   Diet: Diet Order            Diet Carb Modified Fluid consistency: Thin; Room service appropriate? No; Fluid restriction: 1200 mL Fluid  Diet effective now  Code Status: Full code   Family Communication: Called mother-Fernando Maddox-[phone]-no response-unable to leave voicemail.  Subsequently called [phone] left a voicemail-on 4/28  Disposition Plan: Status is: Inpatient  Remains inpatient appropriate because:Inpatient level of care appropriate due to severity of illness   Dispo: The patient is from: Home              Anticipated d/c is to: tbd              Patient currently is not medically stable to d/c.   Difficult to place patient No   Barriers to Discharge: Left leg gangrene-s/p transmetatarsal amputation-hyponatremia-likely will need SNF.   Antimicrobial agents: Anti-infectives (From admission, onward)   Start     Dose/Rate Route Frequency Ordered Stop   02/20/21 1100  ceFAZolin (ANCEF) IVPB 2g/100 mL premix        2 g 200 mL/hr over 30 Minutes Intravenous To Short Stay 02/20/21 0754 02/20/21 1254   02/18/21 1745  Ampicillin-Sulbactam (UNASYN) 3 g in sodium chloride 0.9 % 100 mL IVPB        3 g 200 mL/hr over 30 Minutes Intravenous Every 6 hours 02/18/21 1645 02/21/21 2359   02/18/21 1200  metroNIDAZOLE (FLAGYL) IVPB 500 mg  Status:  Discontinued        500 mg 100 mL/hr over 60 Minutes Intravenous Every 8 hours 02/18/21 1033 02/18/21 1043   02/18/21 1130  cefTRIAXone (ROCEPHIN) 2 g in sodium chloride 0.9 % 100 mL IVPB  Status:  Discontinued        2 g 200 mL/hr over 30 Minutes Intravenous Every 24 hours 02/18/21 1033 02/18/21 1043   02/15/21 0230  vancomycin (VANCOREADY) IVPB 1250 mg/250 mL  Status:  Discontinued        1,250 mg 166.7 mL/hr over 90 Minutes Intravenous Every 12 hours 02/15/21 0140 02/18/21 0735   02/15/21 0030  piperacillin-tazobactam (ZOSYN) IVPB 3.375 g  Status:  Discontinued        3.375 g 12.5 mL/hr over 240 Minutes Intravenous Every 8 hours 02/14/21 2338 02/18/21 1032       Time spent: 25 minutes-Greater than 50% of this time was spent in counseling, explanation of diagnosis,  planning of further management, and coordination of care.  MEDICATIONS: Scheduled Meds: . amiodarone  200 mg Oral BID  . vitamin C  1,000 mg Oral Daily  . Chlorhexidine Gluconate Cloth  6 each Topical Daily  . docusate sodium  100 mg Oral BID  . famotidine  20 mg Oral Daily  . feeding supplement (GLUCERNA SHAKE)  237 mL Oral BID BM  . insulin aspart  0-15 Units Subcutaneous TID WC  . insulin aspart  0-5 Units Subcutaneous QHS  . mouth rinse  15 mL Mouth Rinse BID  . multivitamin with minerals  1 tablet Oral Daily  . pantoprazole  40 mg Oral Daily  . rivaroxaban  20 mg Oral Q supper  . sodium zirconium cyclosilicate  10 g Oral BID  . zinc sulfate  220 mg Oral Daily   Continuous Infusions: . sodium chloride 10 mL/hr at 02/21/21 0810  . ampicillin-sulbactam (UNASYN) IV 3 g (02/21/21 0520)   PRN Meds:.acetaminophen **OR** acetaminophen, albuterol, alum & mag hydroxide-simeth, bisacodyl, guaiFENesin-dextromethorphan, HYDROmorphone (DILAUDID) injection, magnesium citrate, ondansetron **OR** ondansetron (ZOFRAN) IV, phenol, polyethylene glycol, zolpidem   PHYSICAL EXAM: Vital signs: Vitals:   02/21/21 0436 02/21/21 0756 02/21/21 1203 02/21/21 1204  BP:  105/74 130/74 105/89  Pulse:  89 98 95  Resp:  20 (!)  27 18  Temp:  (!) 97.3 F (36.3 C) 97.7 F (36.5 C) 97.7 F (36.5 C)  TempSrc:  Axillary Axillary Axillary  SpO2:  92% 100%   Weight: 111.4 kg     Height:       Filed Weights   02/17/21 0500 02/20/21 0500 02/21/21 0436  Weight: 101.1 kg 103 kg 111.4 kg   Body mass index is 30.7 kg/m.   Gen Exam:Alert awake-not in any distress HEENT:atraumatic, normocephalic Chest: B/L clear to auscultation anteriorly CVS:S1S2 regular Abdomen:soft non tender, non distended Extremities: TMA. Neurology: Non focal Skin: no rash  I have personally reviewed following labs and imaging studies  LABORATORY DATA: CBC: Recent Labs  Lab 02/17/21 0508 02/18/21 0121 02/19/21 0511  02/20/21 0457 02/21/21 0124  WBC 11.7* 12.4* 13.4* 14.2* 13.0*  HGB 12.6* 13.2 12.8* 12.4* 12.6*  HCT 37.7* 39.1 37.8* 37.7* 37.7*  MCV 84.0 82.1 82.2 84.3 83.2  PLT 210 199 183 163 140*    Basic Metabolic Panel: Recent Labs  Lab 02/14/21 2127 02/15/21 0250 02/17/21 0508 02/17/21 1604 02/18/21 0121 02/18/21 0940 02/18/21 1837 02/19/21 0511 02/19/21 1629 02/20/21 0457 02/21/21 0124  NA 126*   < > 122* 123* 123*   < > 122* 123* 124* 126* 127*  K 2.9*   < > 3.7 3.2* 3.4*   < > 3.9 4.3 4.4 4.1 5.3*  CL 94*   < > 90* 89* 90*   < > 88* 90* 92* 90* 95*  CO2 24   < > 22 23 20*   < > 23 20* 20* 21* 12*  GLUCOSE 167*   < > 110* 157* 102*   < > 220* 127* 155* 86 103*  BUN 12   < > 18 19 17    < > 18 17 16 17 20   CREATININE 0.99   < > 1.14 1.05 1.06   < > 1.20 1.19 1.03 1.05 1.07  CALCIUM 7.9*   < > 8.0* 7.8* 7.9*   < > 7.7* 8.0* 8.3* 8.3* 8.2*  MG 1.8   < > 1.8 1.5* 1.8  --   --  1.7  --  2.0 2.1  PHOS 2.8  --  2.1*  --  2.5  --   --   --   --   --   --    < > = values in this interval not displayed.    GFR: Estimated Creatinine Clearance: 100.2 mL/min (by C-G formula based on SCr of 1.07 mg/dL).  Liver Function Tests: Recent Labs  Lab 02/14/21 2127  AST 20  ALT 23  ALKPHOS 72  BILITOT 1.8*  PROT 5.2*  ALBUMIN 2.6*   No results for input(s): LIPASE, AMYLASE in the last 168 hours. Recent Labs  Lab 02/16/21 0622  AMMONIA 27    Coagulation Profile: Recent Labs  Lab 02/14/21 2127  INR 1.5*    Cardiac Enzymes: Recent Labs  Lab 02/18/21 0940  CKTOTAL 45*    BNP (last 3 results) No results for input(s): PROBNP in the last 8760 hours.  Lipid Profile: No results for input(s): CHOL, HDL, LDLCALC, TRIG, CHOLHDL, LDLDIRECT in the last 72 hours.  Thyroid Function Tests: No results for input(s): TSH, T4TOTAL, FREET4, T3FREE, THYROIDAB in the last 72 hours.  Anemia Panel: No results for input(s): VITAMINB12, FOLATE, FERRITIN, TIBC, IRON, RETICCTPCT in the last  72 hours.  Urine analysis: No results found for: COLORURINE, APPEARANCEUR, LABSPEC, PHURINE, GLUCOSEU, HGBUR, BILIRUBINUR, KETONESUR, PROTEINUR, UROBILINOGEN, NITRITE, LEUKOCYTESUR  Sepsis Labs: Lactic Acid, Venous    Component Value Date/Time   LATICACIDVEN 1.6 02/15/2021 0250    MICROBIOLOGY: Recent Results (from the past 240 hour(s))  Surgical pcr screen     Status: Abnormal   Collection Time: 02/14/21  8:00 PM   Specimen: Nasal Mucosa; Nasal Swab  Result Value Ref Range Status   MRSA, PCR NEGATIVE NEGATIVE Final   Staphylococcus aureus POSITIVE (A) NEGATIVE Final    Comment: (NOTE) The Xpert SA Assay (FDA approved for NASAL specimens in patients 53 years of age and older), is one component of a comprehensive surveillance program. It is not intended to diagnose infection nor to guide or monitor treatment. Performed at [facility], [address], [place], [place] [address]   SARS Coronavirus 2 by RT PCR (hospital order, performed in [facility] hospital lab) Nasopharyngeal Nasopharyngeal Swab     Status: None   Collection Time: 02/20/21  8:03 AM   Specimen: Nasopharyngeal Swab  Result Value Ref Range Status   SARS Coronavirus 2 NEGATIVE NEGATIVE Final    Comment: (NOTE) SARS-CoV-2 target nucleic acids are NOT DETECTED.  The SARS-CoV-2 RNA is generally detectable in upper and lower respiratory specimens during the acute phase of infection. The lowest concentration of SARS-CoV-2 viral copies this assay can detect is 250 copies / mL. A negative result does not preclude SARS-CoV-2 infection and should not be used as the sole basis for treatment or other patient management decisions.  A negative result may occur with improper specimen collection / handling, submission of specimen other than nasopharyngeal swab, presence of viral mutation(s) within the areas targeted by this assay, and inadequate number of viral copies (<250 copies / mL). A negative result must be  combined with clinical observations, patient history, and epidemiological information.  Fact Sheet for Patients:   BoilerBrush.com.cy  Fact Sheet for Healthcare Providers: https://pope.com/  This test is not yet approved or  cleared by the [place] FDA and has been authorized for detection and/or diagnosis of SARS-CoV-2 by FDA under an Emergency Use Authorization (EUA).  This EUA will remain in effect (meaning this test can be used) for the duration of the COVID-19 declaration under Section 564(b)(1) of the Act, 21 U.S.C. section 360bbb-3(b)(1), unless the authorization is terminated or revoked sooner.  Performed at [facility], [address] [place], [place] [address]     RADIOLOGY STUDIES/RESULTS: No results found.   LOS: 7 days   Jeoffrey Massed, MD  [facility]    To contact the attending provider between 7A-7P or the covering provider during after hours 7P-7A, please log into the web site [email or web address] and access using universal [place] password for that web site. If you do not have the password, please call the hospital operator.  02/21/2021, 12:37 PM

## 2021-02-21 NOTE — Progress Notes (Signed)
Returned phone call to patient mother for update. Left message with phone number to call back.

## 2021-02-21 NOTE — Consult Note (Signed)
   [facility] CM Inpatient Consult   02/21/2021  AEDON DEASON 1961-06-09 [id number]   [facility] [[facility]]  Accountable Care Organization [ACO] Patient: Medicare CMS DCE  Primary Care Provider:[facility], this  Office is listed to provide the Transition of Care follow up     Patient screened for length of stay  hospitalization to assess for potential [facility]  [[facility]] Care Management service needs for post hospital transition.  Review of patient's medical record reveals patient is post op Day 1 transmetatarsal amputation with wound VAC.  Plan:  Will await PT/OT evaluations for transition of care needs post hospital. Continue to follow progress and disposition to assess for post hospital care management needs.    For questions contact:   Charlesetta Shanks, RN BSN CCM [facility]  [phone] business mobile phone Toll free office [phone]  Fax number: [phone] [email or web address]

## 2021-02-21 NOTE — Progress Notes (Signed)
Pt refused cpap for tonight 

## 2021-02-21 NOTE — Progress Notes (Addendum)
ANTICOAGULATION CONSULT NOTE - Follow Up Consult  Pharmacy Consult for Heparin back to Xarelto Indication: atrial fibrillation  Allergies  Allergen Reactions  . Codeine Itching  . Bee Venom Other (See Comments)    "Feels like his head is on fire when he goes outside in the sun"  . Hydrocodone-Acetaminophen Other (See Comments)  . Other Other (See Comments)    "Feels like his head is on fire when he goes outside in the sun"  . Cephalexin     No mention of this allergy in notes from 07/2016 admission for osteomyelitis.  Patient was treated with Ancef and Unasyn for broader anaerobic coverage.  Patient is not aware of allergy.  . Vicodin [Hydrocodone-Acetaminophen] Itching  . Zocor [Simvastatin] Other (See Comments)    Myalgias    Patient Measurements: Height: 6\' 3"  (190.5 cm) Weight: 111.4 kg (245 lb 9.5 oz) IBW/kg (Calculated) : 84.5 Heparin Dosing Weight: 101 kg   Labs: Recent Labs    02/19/21 0511 02/19/21 1414 02/19/21 1629 02/19/21 2229 02/20/21 0457 02/20/21 0731 02/21/21 0124 02/21/21 0858  HGB 12.8*  --   --   --  12.4*  --  12.6*  --   HCT 37.8*  --   --   --  37.7*  --  37.7*  --   PLT 183  --   --   --  163  --  140*  --   HEPARINUNFRC 0.23*   < >  --  0.19*  --  0.14*  --  <0.10*  CREATININE 1.19  --  1.03  --  1.05  --  1.07  --    < > = values in this interval not displayed.    Estimated Creatinine Clearance: 100.2 mL/min (by C-G formula based on SCr of 1.07 mg/dL).  Assessment: 59 y.o.male with medical history of DM, HTN, strokes, and A. Fib on rate control and systemic AC who presented 02/14/21 as a transfer from [facility] ED for evaluation of limb ischemic and septic shock. Per medical record had been on Xarelto, unclear when patient had his last dose. Patient started on Heparin infusion at [facility]. Pharmacy consulted to follow heparin drip.   Now s/p amputation 4/27 Heparin level < 0.10   Goal of Therapy:  Heparin level 0.3-0.7  units/ml Monitor platelets by anticoagulation protocol: Yes   Plan:  DC heparin and heparin labs Back to Xarelto 20 mg po daily  Thank you 5/27, PharmD

## 2021-02-21 NOTE — Plan of Care (Signed)

## 2021-02-21 NOTE — Progress Notes (Addendum)
Physical Therapy Treatment Patient Details Name: Fernando Maddox MRN: [id number] DOB: April 25, 1961 Today's Date: 02/21/2021    History of Present Illness Fernando Maddox is a 60 y.o. male who presents with necrotic ulceration and purulent drainage from the left forefoot involving the great toe second toe and third toe.  Patient is unsure when this started. On 4/27, pt underwent L transmetatarsal amputation. 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 PMH: a fib, DM, GERD, CVA, drug abuse, HTN.    PT Comments    Pt assisted back to bed. Upon entry pt holding ice cream cup in one hand and spoon in other and asleep. When awakened and asked for ice cream from pt he impulsively put it to his mouth and tried to drink melted ice cream spilling it as well as coughing. Removed ice cream. Pt continues to be limited by lethargy/cognition.    Follow Up Recommendations  SNF     Equipment Recommendations  Rolling walker with 5" wheels    Recommendations for Other Services       Precautions / Restrictions Precautions Precautions: Fall;Other (comment) Precaution Comments: wound vac Required Braces or Orthoses: Other Brace Other Brace: post op shoe Restrictions Weight Bearing Restrictions: Yes LLE Weight Bearing: Touchdown weight bearing LLE Partial Weight Bearing Percentage or Pounds: through heel with post op shoe    Mobility  Bed Mobility Overal bed mobility: Needs Assistance Bed Mobility: Sit to Sidelying       Sit to sidelying: Min assist General bed mobility comments: Assist to bring LLE into the bed. Cues to initiate movement    Transfers Overall transfer level: Needs assistance Equipment used: Rolling walker (2 wheeled) Transfers: Sit to/from [employer] Sit to Stand: Mod assist;+2 safety/equipment;From elevated surface Stand pivot transfers: Mod assist;+2 safety/equipment       General transfer comment: Assist to bring hips up and for  balance. Small pivotal steps with walker chair to bed.  Pt unable to maintain weight bearing limitations on LLE  Ambulation/Gait             General Gait Details: Unable due to attempt due to lethargy   Stairs             Wheelchair Mobility    Modified Rankin (Stroke Patients Only)       Balance Overall balance assessment: Needs assistance Sitting-balance support: Feet supported Sitting balance-Leahy Scale: Fair     Standing balance support: Bilateral upper extremity supported Standing balance-Leahy Scale: Poor Standing balance comment: walker and mod assist for static standing                            Cognition Arousal/Alertness: Lethargic Behavior During Therapy: Flat affect Overall Cognitive Status: Impaired/Different from baseline Area of Impairment: Attention;Following commands;Safety/judgement;Awareness;Problem solving;Orientation                 Orientation Level: Disoriented to;Time;Situation Current Attention Level: Sustained   Following Commands: Follows one step commands with increased time;Follows one step commands inconsistently Safety/Judgement: Decreased awareness of safety;Decreased awareness of deficits Awareness: Intellectual Problem Solving: Slow processing;Decreased initiation;Difficulty sequencing;Requires verbal cues;Requires tactile cues General Comments: Pt requiring constant cues for all activity      Exercises      General Comments        Pertinent Vitals/Pain Pain Assessment: No/denies pain    Home Living Family/patient expects to be discharged to:: Private residence Living Arrangements: Alone Available  Help at Discharge: Family;Available PRN/intermittently Type of Home: House Home Access: Stairs to enter Entrance Stairs-Rails: Right;Left Home Layout: One level Home Equipment: None      Prior Function Level of Independence: Independent      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 no  use of AD   PT Goals (current goals can now be found in the care plan section) Acute Rehab PT Goals Patient Stated Goal: none stated PT Goal Formulation: Patient unable to participate in goal setting Time For Goal Achievement: 03/07/21 Potential to Achieve Goals: Good Progress towards PT goals: Not progressing toward goals - comment (lethargy)    Frequency    Min 3X/week      PT Plan Current plan remains appropriate    Co-evaluation              AM-PAC PT "6 Clicks" Mobility   Outcome Measure  Help needed turning from your back to your side while in a flat bed without using bedrails?: A Lot Help needed moving from lying on your back to sitting on the side of a flat bed without using bedrails?: A Lot Help needed moving to and from a bed to a chair (including a wheelchair)?: A Lot Help needed standing up from a chair using your arms (e.g., wheelchair or bedside chair)?: A Lot Help needed to walk in hospital room?: Total Help needed climbing 3-5 steps with a railing? : Total 6 Click Score: 10    End of Session Equipment Utilized During Treatment: Gait belt Activity Tolerance: Patient limited by lethargy Patient left: with call bell/phone within reach;in bed;with bed alarm set Nurse Communication: Mobility status (nurse assisted with transfer) PT Visit Diagnosis: Other abnormalities of gait and mobility (R26.89);Unsteadiness on feet (R26.81);Difficulty in walking, not elsewhere classified (R26.2)     Time: 3354-5625 PT Time Calculation (min) (ACUTE ONLY): 11 min  Charges:  $Therapeutic Activity: 8-22 mins                     [facility] PT Acute Rehabilitation Services Pager [phone] Office [phone]    Angelina Ok [facility] 02/21/2021, 4:29 PM

## 2021-02-21 NOTE — Evaluation (Signed)
Occupational Therapy Evaluation Patient Details Name: Fernando Maddox MRN: [id number] DOB: [birth year] Today's Date: 02/21/2021    History of Present Illness Fernando Maddox is a 60 y.o. male who presents with necrotic ulceration and purulent drainage from the left forefoot involving the great toe second toe and third toe.  Patient is unsure when this started. On 4/27, pt underwent L transmetatarsal amputation. 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 PMH: a fib, DM, GERD, CVA, drug abuse, HTN.   Clinical Impression   PTA, pt lives alone and reports Independence with ADLs, IADLs and mobility without AD. Pt received now in bed, eyes closed and moaning throughout session. Pt noted with IV dislodged and tangled in lines with IV leaking on pt's bed. RN in to assess and assist. Pt overall Min A for bed mobility to roll side to side but poor initiation of tasks (cognition vs self limiting?). Pt Max A for UB ADLs and Total A for LB ADLs due to deficits noted below. Pt confused, asking about surgery today? Reoriented to situation and educated on WB precautions. No post op shoe able to be located in room - contacted ortho tech for trial of darco shoe to maximize ability to maintain WB precautions. Recommend SNF for short term rehab, as pt unable to care for self without assistance. Plan to progress OOB ADLs as tolerated and further reinforce precautions.     Follow Up Recommendations  SNF;Supervision/Assistance - 24 hour    Equipment Recommendations  3 in 1 bedside commode;Wheelchair (measurements OT);Wheelchair cushion (measurements OT);Other (comment) (Rolling walker; pending progress)    Recommendations for Other Services       Precautions / Restrictions Precautions Precautions: Fall;Other (comment) Precaution Comments: wound vac Required Braces or Orthoses: Other Brace Other Brace: post op shoe per orders (not in room on eval); asked ortho tech for darco  shoe Restrictions Weight Bearing Restrictions: Yes LLE Weight Bearing: Partial weight bearing LLE Partial Weight Bearing Percentage or Pounds: WB through heel with post op shoe      Mobility Bed Mobility Overal bed mobility: Needs Assistance Bed Mobility: Rolling Rolling: Min assist         General bed mobility comments: Min A for rolling side to side, slow movements and poor motivation    Transfers                      Balance                                           ADL either performed or assessed with clinical judgement   ADL Overall ADL's : Needs assistance/impaired Eating/Feeding: Set up;Bed level   Grooming: Supervision/safety;Bed level;Wash/dry face Grooming Details (indicate cue type and reason): able to wash face in bed, attempting to faciliate improved eye opening Upper Body Bathing: Maximal assistance;Bed level   Lower Body Bathing: Total assistance;Bed level   Upper Body Dressing : Maximal assistance;Bed level Upper Body Dressing Details (indicate cue type and reason): Max A to doff/don new hospital gown in bed, self limiting Lower Body Dressing: Total assistance;Bed level       Toileting- Clothing Manipulation and Hygiene: Total assistance;Bed level         General ADL Comments: Pt with self limiting behaviors throughout, cognitive deficits and decreased awareness of situation/WB precautions, etc. pt moaning throughout session  Vision Patient Visual Report: No change from baseline Vision Assessment?: No apparent visual deficits Additional Comments: though kept eyes closed for whole session     Perception     Praxis      Pertinent Vitals/Pain Pain Assessment: Faces Faces Pain Scale: Hurts a little bit Pain Location: generalized with movement Pain Descriptors / Indicators: Discomfort;Grimacing;Guarding Pain Intervention(s): Monitored during session;Repositioned     Hand Dominance Right   Extremity/Trunk  Assessment Upper Extremity Assessment Upper Extremity Assessment: Generalized weakness   Lower Extremity Assessment Lower Extremity Assessment: Defer to PT evaluation       Communication Communication Communication: No difficulties   Cognition Arousal/Alertness: Awake/alert;Lethargic Behavior During Therapy: Flat affect;Restless;Impulsive Overall Cognitive Status: Impaired/Different from baseline Area of Impairment: Attention;Following commands;Safety/judgement;Awareness;Problem solving                   Current Attention Level: Sustained   Following Commands: Follows one step commands with increased time Safety/Judgement: Decreased awareness of safety;Decreased awareness of deficits Awareness: Intellectual Problem Solving: Slow processing;Decreased initiation;Difficulty sequencing;Requires verbal cues;Requires tactile cues General Comments: Pt with eyes closed throughout session. initially thought pt lethargic but with slow responses throughout session - alert. Pt with slow initiation, self limiting. Asking about surgery today? Oriented that pt had surgery yesterday. Pt perseverated on scratching bottom, cues to redirect inappropriate behavior   General Comments  On entry, pt with IV line pulled, tangled around self and IV contents soiled bed.    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        OT  Problem List: Decreased strength;Decreased activity tolerance;Impaired balance (sitting and/or standing);Decreased cognition;Decreased safety awareness;Decreased knowledge of use of DME or AE;Decreased knowledge of precautions      OT Treatment/Interventions: Self-care/ADL training;Therapeutic exercise;Energy conservation;DME and/or AE instruction;Therapeutic activities;Patient/family education;Balance training    OT Goals(Current goals can be found in the care plan section) Acute Rehab OT Goals Patient Stated Goal: none stated OT Goal Formulation: With patient Time For Goal Achievement: 03/06/21 Potential to Achieve Goals: Good  OT Frequency: Min 2X/week   Barriers to D/C:            Co-evaluation              AM-PAC OT "6 Clicks" Daily Activity     Outcome Measure Help from another person eating meals?: A Little Help from another person taking care of personal grooming?: A Little Help from another person toileting, which includes using toliet, bedpan, or urinal?: Total Help from another person bathing (including washing, rinsing, drying)?: A Lot Help from another person to put on and taking off regular upper body clothing?: A Lot Help from another person to put on and taking off regular lower body clothing?: Total 6 Click Score: 12   End of Session Nurse Communication: Mobility status (RN present during session)  Activity Tolerance: Other (comment) (limited by cognition, self limiting behaviors) Patient left: in bed;with call bell/phone within  reach;with bed alarm set;with nursing/sitter in room  OT Visit Diagnosis: Unsteadiness on feet (R26.81);Other abnormalities of gait and mobility (R26.89);Muscle weakness (generalized) (M62.81);Other symptoms and signs involving cognitive function                Time: 7943-2761 OT Time Calculation (min): 31 min Charges:  OT General Charges $OT Visit: 1 Visit OT Evaluation $OT Eval Moderate Complexity: 1 Mod OT Treatments $Self  Care/Home Management : 8-22 mins  Bradd Canary, OTR/L Acute Rehab Services Office: [phone]  Lorre Munroe 02/21/2021, 9:09 AM

## 2021-02-21 NOTE — NC FL2 (Signed)
[place] MEDICAID FL2 LEVEL OF CARE SCREENING TOOL     IDENTIFICATION  Patient Name: Fernando Maddox Birthdate: Apr 14, 1961 Sex: male Admission Date (Current Location): 02/14/2021  [facility] and [place] Number:  Producer, television/film/video and Address:  [facility], [address] [place], [place] [address]      Provider Number: [id number]  Attending Physician Name and Address:  Maretta Bees, MD  Relative Name and Phone Number:  Keaun, Schnabel Rama Mother [phone]    Current Level of Care: Hospital Recommended Level of Care: Skilled Nursing Facility Prior Approval Number: [id number]  Date Approved/Denied:   PASRR Number:    Discharge Plan: SNF    Current Diagnoses: Patient Active Problem List   Diagnosis Date Noted  . Ulcer of left foot with necrosis of bone (HCC)   . Septic shock (HCC) 02/14/2021  . Atrial fibrillation (HCC)   . Cerebrovascular disease   . Chronic pain syndrome   . Hyperlipidemia   . Shoulder pain   . Anemia   . GERD (gastroesophageal reflux disease)   . Hypercholesteremia   . Hypogonadism male   . Rotator cuff syndrome   . Sleep apnea   . Chronic anticoagulation 03/25/2018  . Chronic systolic congestive heart failure (HCC) 03/25/2018  . Osteomyelitis, unspecified (HCC) 10/06/2017  . Diabetic ulcer of toe of right foot associated with type 2 diabetes mellitus (HCC) 10/01/2017  . Cellulitis 09/29/2017  . History of CVA (cerebrovascular accident) 09/29/2017  . Polysubstance abuse (HCC) 09/29/2017  . 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 Community acquired pneumonia 08/11/2016  . Tobacco use 08/11/2016  . Acute combined systolic and diastolic CHF, NYHA class 1 (HCC) 08/07/2016  . 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 History of drug abuse (HCC) 08/05/2016  . Osteoarthritis 08/05/2016  . PAF (paroxysmal atrial fibrillation) (HCC) 08/05/2016  . Stroke (HCC) 08/05/2016  .  Diabetes mellitus, type 2 (HCC) 02/11/2010  . Hyperlipemia 02/11/2010  . HYPERTENSION, UNSPECIFIED 02/11/2010  . CAD (coronary artery disease) 02/11/2010  . Hypertension 02/11/2010  . ESOPHAGEAL STRICTURE 02/11/2010  . BACK PAIN 02/11/2010  . HEADACHE 02/11/2010  . CHEST PAIN 02/11/2010  . DEPRESSION/ANXIETY 02/11/2010    Orientation RESPIRATION BLADDER Height & Weight     Self,Place  Normal Incontinent,External catheter Weight: 245 lb 9.5 oz (111.4 kg) Height:  6\' 3"  (190.5 cm)  BEHAVIORAL SYMPTOMS/MOOD NEUROLOGICAL BOWEL NUTRITION STATUS      Continent Diet (see d/c summary)  AMBULATORY STATUS COMMUNICATION OF NEEDS Skin   Extensive Assist Verbally PU Stage and Appropriate Care,Surgical wounds (Incision left foot; blister top of left foot; Deep tissue pressure injury on medial sacrum)                       Personal Care Assistance Level of Assistance  Bathing,Dressing,Feeding Bathing Assistance: Limited assistance Feeding assistance: Independent Dressing Assistance: Limited assistance     Functional Limitations Info  Sight,Hearing,Speech Sight Info: Adequate Hearing Info: Adequate Speech Info: Adequate    SPECIAL CARE FACTORS FREQUENCY  OT (By licensed OT),PT (By licensed PT)     PT Frequency: 5x/week OT Frequency: 5x/week            Contractures Contractures Info: Not present    Additional Factors Info  Code Status,Allergies Code Status Info: Full code Allergies Info: Hydrocodone-acetaminophen, bee venom, codein,Cephalexin, Vicodin (hydrocodone-acetaminophen), Zocor (simvastatin), sun sensitive           Current Medications (  02/21/2021):  This is the current hospital active medication list Current Facility-Administered Medications  Medication Dose Route Frequency Provider Last Rate Last Admin  . 0.9 %  sodium chloride infusion   Intravenous Continuous Maretta Bees, MD 10 mL/hr at 02/21/21 0810 Rate Change at 02/21/21 0810  . acetaminophen  (TYLENOL) tablet 650 mg  650 mg Oral Q6H PRN Persons, [place], [place]   650 mg at 02/19/21 2040   Or  . acetaminophen (TYLENOL) suppository 650 mg  650 mg Rectal Q6H PRN Persons, [place], [place]      . albuterol (PROVENTIL) (2.5 MG/3ML) 0.083% nebulizer solution 3 mL  3 mL Inhalation Q6H PRN Persons, [place], [place]      . alum & mag hydroxide-simeth (MAALOX/MYLANTA) 200-200-20 MG/5ML suspension 15-30 mL  15-30 mL Oral Q2H PRN Persons, [place], [place]      . amiodarone (PACERONE) tablet 200 mg  200 mg Oral BID Persons, [place], [place]   200 mg at 02/21/21 0851  . Ampicillin-Sulbactam (UNASYN) 3 g in sodium chloride 0.9 % 100 mL IVPB  3 g Intravenous Q6H Ghimire, Shanker M, MD 200 mL/hr at 02/21/21 1431 3 g at 02/21/21 1431  . ascorbic acid (VITAMIN C) tablet 1,000 mg  1,000 mg Oral Daily Persons, [place], [place]   1,000 mg at 02/21/21 0851  . bisacodyl (DULCOLAX) EC tablet 5 mg  5 mg Oral Daily PRN Persons, [place], [place]      . Chlorhexidine Gluconate Cloth 2 % PADS 6 each  6 each Topical Daily Persons, [place], [place]   6 each at 02/21/21 [phone]  . docusate sodium (COLACE) capsule 100 mg  100 mg Oral BID Persons, [place], [place]   100 mg at 02/21/21 0851  . famotidine (PEPCID) tablet 20 mg  20 mg Oral Daily Persons, [place], [place]   20 mg at 02/21/21 0850  . feeding supplement (GLUCERNA SHAKE) (GLUCERNA SHAKE) liquid 237 mL  237 mL Oral BID BM Persons, [place], [place]   237 mL at 02/21/21 0934  . guaiFENesin-dextromethorphan (ROBITUSSIN DM) 100-10 MG/5ML syrup 15 mL  15 mL Oral Q4H PRN Persons, [place], [place]      . insulin aspart (novoLOG) injection 0-15 Units  0-15 Units Subcutaneous TID WC Persons, [place], [place]   3 Units at 02/19/21 1647  . insulin aspart (novoLOG) injection 0-5 Units  0-5 Units Subcutaneous QHS Persons, [place], [place]   3 Units at 02/20/21 2120  . magnesium citrate solution 1 Bottle  1 Bottle Oral Once PRN Persons, [place], [place]      . MEDLINE mouth rinse  15 mL Mouth Rinse BID Persons, [place],  [place]   15 mL at 02/21/21 0934  . multivitamin with minerals tablet 1 tablet  1 tablet Oral Daily Persons, [place], [place]   1 tablet at 02/21/21 [phone]  . ondansetron (ZOFRAN) tablet 4 mg  4 mg Oral Q6H PRN Persons, [place], [place]       Or  . ondansetron (ZOFRAN) injection 4 mg  4 mg Intravenous Q6H PRN Persons, [place], [place]      . oxyCODONE-acetaminophen (PERCOCET/ROXICET) 5-325 MG per tablet 1 tablet  1 tablet Oral Q6H PRN Ghimire, Werner Lean, MD      . pantoprazole (PROTONIX) EC tablet 40 mg  40 mg Oral Daily Persons, [place], [place]   40 mg at 02/21/21 7616  . phenol (CHLORASEPTIC) mouth spray 1 spray  1 spray Mouth/Throat PRN  Persons, [place], [place]      . polyethylene glycol (MIRALAX / GLYCOLAX) packet 17 g  17 g Oral Daily PRN Persons, [place], [place]      . rivaroxaban (XARELTO) tablet 20 mg  20 mg Oral Q supper Maretta Bees, MD   20 mg at 02/21/21 1431  . sodium zirconium cyclosilicate (LOKELMA) packet 10 g  10 g Oral BID Maretta Bees, MD   10 g at 02/21/21 0851  . zinc sulfate capsule 220 mg  220 mg Oral Daily Persons, [place], [place]   220 mg at 02/21/21 0850  . zolpidem (AMBIEN) tablet 5 mg  5 mg Oral QHS PRN Persons, [place], [place]   5 mg at 02/19/21 2040     Discharge Medications: Please see discharge summary for a list of discharge medications.  Relevant Imaging Results:  Relevant Lab Results:   Additional Information SSN [id number] Vaccinated x2  Doss Cybulski [address] [place], [place]

## 2021-02-21 NOTE — Progress Notes (Signed)
Patient ID: Fernando Maddox, male   DOB: 08-02-1961, 60 y.o.   MRN: [id number] Patient is postoperative day 1 transmetatarsal amputation the margins were clear the wound VAC is functioning well however its not plugged in.  Plugs are available through the operating room.  Patient may be touchdown weightbearing on his heel.

## 2021-02-21 NOTE — Evaluation (Signed)
Physical Therapy Evaluation Patient Details Name: Fernando Maddox MRN: [id number] DOB: 1961/06/08 Today's Date: 02/21/2021   History of Present Illness  Fernando Maddox is a 60 y.o. male who presents with necrotic ulceration and purulent drainage from the left forefoot involving the great toe second toe and third toe.  Patient is unsure when this started. On 4/27, pt underwent L transmetatarsal amputation. 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 PMH: a fib, DM, GERD, CVA, drug abuse, HTN.  Clinical Impression  Pt presents to PT with decr mobility due to lethargy, decr balance, weakness, and weight bearing limitations due to transmetatarsal amputation. Currently recommend SNF but as pt becomes less lethargic he may progress more rapidly and need an updated DC plan.     Follow Up Recommendations SNF    Equipment Recommendations  Rolling walker with 5" wheels    Recommendations for Other Services       Precautions / Restrictions Precautions Precautions: Fall;Other (comment) Precaution Comments: wound vac Required Braces or Orthoses: Other Brace Other Brace: post op shoe Restrictions Weight Bearing Restrictions: Yes LLE Weight Bearing: Touchdown weight bearing LLE Partial Weight Bearing Percentage or Pounds: through heel with post op shoe      Mobility  Bed Mobility Overal bed mobility: Needs Assistance Bed Mobility: Rolling;Supine to Sit;Sit to Supine Rolling: +2 for physical assistance;Mod assist   Supine to sit: +2 for physical assistance;Max assist     General bed mobility comments: Assist for all aspects due to lethargy    Transfers Overall transfer level: Needs assistance Equipment used: Rolling walker (2 wheeled) Transfers: Sit to/from [employer] Sit to Stand: Mod assist;+2 safety/equipment;From elevated surface Stand pivot transfers: Mod assist;+2 safety/equipment       General transfer comment: Assist to bring hips up  and for balance. Small pivotal steps with walker bed to chair with frequent verbal/tactile cues to continue to stay alert  Ambulation/Gait             General Gait Details: Unable due to lethargy  Stairs            Wheelchair Mobility    Modified Rankin (Stroke Patients Only)       Balance Overall balance assessment: Needs assistance Sitting-balance support: Feet supported Sitting balance-Leahy Scale: Fair     Standing balance support: Bilateral upper extremity supported Standing balance-Leahy Scale: Poor Standing balance comment: walker and min assist for static standing                             Pertinent Vitals/Pain Pain Assessment: No/denies pain    Home Living Family/patient expects to be discharged to:: Private residence Living Arrangements: Alone Available Help at Discharge: Family;Available PRN/intermittently Type of Home: House Home Access: Stairs to enter Entrance Stairs-Rails: Doctor, general practice of Steps: 5 Home Layout: One level Home Equipment: None      Prior Function Level of Independence: Independent         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     Hand Dominance   Dominant Hand: Right    Extremity/Trunk Assessment   Upper Extremity Assessment Upper Extremity Assessment: Defer to OT evaluation    Lower Extremity Assessment Lower Extremity Assessment: Generalized weakness;LLE deficits/detail LLE Deficits / Details: transmetatarsal amputation       Communication   Communication: No difficulties  Cognition Arousal/Alertness: Lethargic Behavior During Therapy: Flat affect Overall Cognitive Status:  Impaired/Different from baseline Area of Impairment: Attention;Following commands;Safety/judgement;Awareness;Problem solving;Orientation                 Orientation Level: Disoriented to;Time;Situation Current Attention Level: Sustained   Following Commands: Follows  one step commands with increased time;Follows one step commands inconsistently Safety/Judgement: Decreased awareness of safety;Decreased awareness of deficits Awareness: Intellectual Problem Solving: Slow processing;Decreased initiation;Difficulty sequencing;Requires verbal cues;Requires tactile cues General Comments: Pt lethargic and aroused with max verbal/tactile stimulation. Eyes remained closed. Quickly back to sleep in not constantly stimulated and sometimes back to sleep even then. Pt didn't realize he had had surgery      General Comments      Exercises     Assessment/Plan    PT Assessment Patient needs continued PT services  PT Problem List Decreased strength;Decreased activity tolerance;Decreased balance;Decreased mobility;Decreased cognition       PT Treatment Interventions DME instruction;Gait training;Stair training;Functional mobility training;Therapeutic activities;Therapeutic exercise;Balance training;Cognitive remediation;Patient/family education    PT Goals (Current goals can be found in the Care Plan section)  Acute Rehab PT Goals Patient Stated Goal: none stated PT Goal Formulation: Patient unable to participate in goal setting Time For Goal Achievement: 03/07/21 Potential to Achieve Goals: Good    Frequency Min 3X/week   Barriers to discharge Decreased caregiver support Lives alone. Stairs to enter    Co-evaluation               AM-PAC PT "6 Clicks" Mobility  Outcome Measure Help needed turning from your back to your side while in a flat bed without using bedrails?: A Lot Help needed moving from lying on your back to sitting on the side of a flat bed without using bedrails?: A Lot Help needed moving to and from a bed to a chair (including a wheelchair)?: A Lot Help needed standing up from a chair using your arms (e.g., wheelchair or bedside chair)?: A Lot Help needed to walk in hospital room?: Total Help needed climbing 3-5 steps with a railing?  : Total 6 Click Score: 10    End of Session Equipment Utilized During Treatment: Gait belt Activity Tolerance: Patient limited by lethargy Patient left: in chair;with call bell/phone within reach;with chair alarm set Nurse Communication: Mobility status PT Visit Diagnosis: Other abnormalities of gait and mobility (R26.89);Unsteadiness on feet (R26.81);Difficulty in walking, not elsewhere classified (R26.2)    Time: 1884-1660 PT Time Calculation (min) (ACUTE ONLY): 31 min   Charges:   PT Evaluation $PT Eval Moderate Complexity: 1 Mod PT Treatments $Therapeutic Activity: 8-22 mins        [employer] PT Acute Rehabilitation Services Pager [phone] Office [phone]   Angelina Ok [facility] 02/21/2021, 2:16 PM

## 2021-02-21 NOTE — TOC Progression Note (Signed)
Transition of Care [facility]) - Progression Note    Patient Details  Name: Fernando Maddox MRN: [id number] Date of Birth: [birth year]  [facility] CM/SW Contact  Erin Sons, [place] Phone Number: 02/21/2021, 3:23 PM  Clinical Narrative:     Pt not oriented. CSW calls pt's mother regarding SNF consult. Mother states that pt lives alone in [place]. Pt's mother and father live in [place] nearby. Mother reports that pt has been to a SNF in the past but that it's been years and she doesn't remember the name. She reports preference [place] area and specifically for [facility] as first choice . Pt is vaccinated x2 and has not had a booster. CSW will complete FL2 and fax bed requests in hub.   Expected Discharge Plan: Skilled Nursing Facility Barriers to Discharge: Continued Medical Work up  Expected Discharge Plan and Services Expected Discharge Plan: Skilled Nursing Facility   Discharge Planning Services: CM Consult   Living arrangements for the past 2 months: Single Family Home                                       Social Determinants of Health (SDOH) Interventions    Readmission Risk Interventions No flowsheet data found.

## 2021-02-21 NOTE — Progress Notes (Signed)
Inpatient Rehabilitation Admissions Coordinator  Noted therapy recommends SNF at this time. We will sign off.  Ottie Glazier, RN, MSN Rehab Admissions Coordinator [phone] 02/21/2021 5:12 PM

## 2021-02-22 DIAGNOSIS — I5021 Acute systolic (congestive) heart failure: Secondary | ICD-10-CM | POA: Diagnosis not present

## 2021-02-22 LAB — CBC
HCT: 36.3 % — ABNORMAL LOW (ref 39.0–52.0)
Hemoglobin: 11.9 g/dL — ABNORMAL LOW (ref 13.0–17.0)
MCH: 27.5 pg (ref 26.0–34.0)
MCHC: 32.8 g/dL (ref 30.0–36.0)
MCV: 84 fL (ref 80.0–100.0)
Platelets: 130 10*3/uL — ABNORMAL LOW (ref 150–400)
RBC: 4.32 MIL/uL (ref 4.22–5.81)
RDW: 17.1 % — ABNORMAL HIGH (ref 11.5–15.5)
WBC: 9.5 10*3/uL (ref 4.0–10.5)
nRBC: 0 % (ref 0.0–0.2)

## 2021-02-22 LAB — COMPREHENSIVE METABOLIC PANEL
ALT: 259 U/L — ABNORMAL HIGH (ref 0–44)
AST: 231 U/L — ABNORMAL HIGH (ref 15–41)
Albumin: 2.4 g/dL — ABNORMAL LOW (ref 3.5–5.0)
Alkaline Phosphatase: 85 U/L (ref 38–126)
Anion gap: 12 (ref 5–15)
BUN: 24 mg/dL — ABNORMAL HIGH (ref 6–20)
CO2: 22 mmol/L (ref 22–32)
Calcium: 8.5 mg/dL — ABNORMAL LOW (ref 8.9–10.3)
Chloride: 93 mmol/L — ABNORMAL LOW (ref 98–111)
Creatinine, Ser: 1.38 mg/dL — ABNORMAL HIGH (ref 0.61–1.24)
GFR, Estimated: 59 mL/min — ABNORMAL LOW (ref >60)
Glucose, Bld: 108 mg/dL — ABNORMAL HIGH (ref 70–99)
Potassium: 4.2 mmol/L (ref 3.5–5.1)
Sodium: 127 mmol/L — ABNORMAL LOW (ref 135–145)
Total Bilirubin: 2.5 mg/dL — ABNORMAL HIGH (ref 0.3–1.2)
Total Protein: 6 g/dL — ABNORMAL LOW (ref 6.5–8.1)

## 2021-02-22 LAB — GLUCOSE, CAPILLARY
Glucose-Capillary: 139 mg/dL — ABNORMAL HIGH (ref 70–99)
Glucose-Capillary: 144 mg/dL — ABNORMAL HIGH (ref 70–99)
Glucose-Capillary: 242 mg/dL — ABNORMAL HIGH (ref 70–99)
Glucose-Capillary: 330 mg/dL — ABNORMAL HIGH (ref 70–99)

## 2021-02-22 NOTE — Progress Notes (Signed)
Patient is status post transmetatarsal amputation.  He is comfortable in bed.  Prevena wound VAC is functioning with 6 lites left.  0 cc in the canister  Status post above patient will follow-up in our office in 1 week.  If wound VAC alarms or stops working he is to call our office right away

## 2021-02-22 NOTE — Progress Notes (Signed)
PROGRESS NOTE        PATIENT DETAILS Name: Fernando Maddox Age: 60 y.o. Sex: male Date of Birth: 1960-12-10 Admit Date: 02/14/2021 Admitting Physician Charlott Holler, MD PCP:Van Domenic Schwab, MD  Brief Narrative: Patient is a 60 y.o. male with history of HFrEF, permanent atrial fibrillation, CAD, DM-2, known PAD-s/p partial amputation of right great toe presented to [facility] as a transfer from [facility] ED for septic shock in the setting of critical right lower extremity ischemia.  Significant events: 4/21>> transferred to [facility] from [facility]- found down-gangrene is right lower extremity-CT angio performed which showed concern for acute right arterial occlusion. 4/24>> transfer to [facility].  Significant studies: 4/21>> CT angio abdominal aorta with iliofemoral runoff: No opacification beyond mid SFA-suspicious for arterial embolism.  L1, L2, L3 and L4 vertebral bodies compression fractures.  Mildly enlarged left retroperitoneal, pelvic and inguinal lymph nodes. 4/21>> CT chest: No acute pulmonary disease. 4/21>> CT head: No acute intracranial abnormality 4/21>> CT C-spine: No acute fractures. 4/22>> chest x-ray: No active disease 4/22>> right lower extremity arterial duplex: No evidence of stenosis or occlusive disease. 4/22>> Echo: EF 20-25%, RV systolic function severely reduced  Antimicrobial therapy: Vancomycin: 4/21>>4/24 Zosyn: 4/21>> 4/24 Unasyn: 4/25>>4/28  Microbiology data: None  Procedures : 4/27>> left transmetatarsal amputation  Consults: Vascular surgery, PCCM, orthopedics, cardiology  DVT Prophylaxis : rivaroxaban (XARELTO) tablet 20 mg IV heparin  Subjective: More awake compared to yesterday-following commands-answering simple questions appropriately.   Assessment/Plan: Septic shock due to critical left limb ischemia with left forefoot ulceration/gangrene: Sepsis physiology resolved-briefly required pressors while at [facility] health-evaluated by  orthopedics-patient is s/p left transmetatarsal amputation on 4/27.  IV antibiotics will be discontinued on 4/28.  Continue supportive care-orthopedics following-okay to be touchdown weightbearing on his heel.  Acute metabolic encephalopathy: Due to above-found unresponsive at home-slowly improving.  Hyponatremia: Suspect due to CHF-we will place on fluid restriction-no response to IV Lasix-due to bump in creatinine-hold Lasix for now.  We will consider initiation of Samsca over the next few days.  Hyperkalemia: Resolved-stop Lokelma.  Transaminitis: Concern for either hepatic congestion or amiodarone induced hepatitis.  Discussed with cardiology-stop amiodarone.  We will check acute hepatitis serology with a.m. labs.  Follow LFTs.  Chronic atrial fibrillation with RVR: Rate controlled-initially required IV amiodarone infusion-maintained on oral amiodarone that will be discontinued on 4/21 due to bump in LFTs.  Continue Xarelto. Per CTA report-suspicion that critical ischemia could have been from an embolic event-unclear how compliant patient has been with anticoagulation in the past (mother on 4/29 acknowledges noncompliance to medication).  Acute on chronic systolic/diastolic heart failure/new RV failure: Volume status is improved-cardiology following-defer initiation of CHF medications to cardiology service.  Due to bump in LFTs-concern for hepatic congestion in the setting of biventricular heart failure.  Continues to have persistent hyponatremia.  History of CAD: No anginal symptoms-not on aspirin as patient on anticoagulation  DM-2 (A1c 7.7 on 4/25): Monitor closely with SSI.  Follow CBGs closely.  Resume metformin on discharge.  Recent Labs    02/21/21 2000 02/22/21 0804 02/22/21 1221  GLUCAP 159* 139* 144*   Prior history of polysubstance use: UDS 3 years back positive for amphetamine/cannabinoids.  ?  OSA: Sleep study outpatient.   Nutrition Problem: Nutrition Problem: Moderate  Malnutrition Etiology: chronic illness (CHF) Signs/Symptoms: mild muscle depletion,moderate muscle depletion,mild fat depletion,moderate fat depletion  Interventions: MVI,Glucerna shake,Magic cup   Diet: Diet Order            Diet Carb Modified Fluid consistency: Thin; Room service appropriate? No; Fluid restriction: 1200 mL Fluid  Diet effective now                  Code Status: Full code   Family Communication: Called mother-[name]-[phone]-updated over the phone on 4/29.  Disposition Plan: Status is: Inpatient  Remains inpatient appropriate because:Inpatient level of care appropriate due to severity of illness   Dispo: The patient is from: Home              Anticipated d/c is to: tbd              Patient currently is not medically stable to d/c.   Difficult to place patient No   Barriers to Discharge: Left leg gangrene-s/p transmetatarsal amputation-hyponatremia-likely will need SNF.   Antimicrobial agents: Anti-infectives (From admission, onward)   Start     Dose/Rate Route Frequency Ordered Stop   02/20/21 1100  ceFAZolin (ANCEF) IVPB 2g/100 mL premix        2 g 200 mL/hr over 30 Minutes Intravenous To Short Stay 02/20/21 0754 02/20/21 1254   02/18/21 1745  Ampicillin-Sulbactam (UNASYN) 3 g in sodium chloride 0.9 % 100 mL IVPB        3 g 200 mL/hr over 30 Minutes Intravenous Every 6 hours 02/18/21 1645 02/21/21 2359   02/18/21 1200  metroNIDAZOLE (FLAGYL) IVPB 500 mg  Status:  Discontinued        500 mg 100 mL/hr over 60 Minutes Intravenous Every 8 hours 02/18/21 1033 02/18/21 1043   02/18/21 1130  cefTRIAXone (ROCEPHIN) 2 g in sodium chloride 0.9 % 100 mL IVPB  Status:  Discontinued        2 g 200 mL/hr over 30 Minutes Intravenous Every 24 hours 02/18/21 1033 02/18/21 1043   02/15/21 0230  vancomycin (VANCOREADY) IVPB 1250 mg/250 mL  Status:  Discontinued        1,250 mg 166.7 mL/hr over 90 Minutes Intravenous Every 12 hours 02/15/21  0140 02/18/21 0735   02/15/21 0030  piperacillin-tazobactam (ZOSYN) IVPB 3.375 g  Status:  Discontinued        3.375 g 12.5 mL/hr over 240 Minutes Intravenous Every 8 hours 02/14/21 2338 02/18/21 1032       Time spent: 25 minutes-Greater than 50% of this time was spent in counseling, explanation of diagnosis, planning of further management, and coordination of care.  MEDICATIONS: Scheduled Meds: . vitamin C  1,000 mg Oral Daily  . Chlorhexidine Gluconate Cloth  6 each Topical Daily  . docusate sodium  100 mg Oral BID  . famotidine  20 mg Oral Daily  . feeding supplement (GLUCERNA SHAKE)  237 mL Oral BID BM  . insulin aspart  0-15 Units Subcutaneous TID WC  . insulin aspart  0-5 Units Subcutaneous QHS  . mouth rinse  15 mL Mouth Rinse BID  . multivitamin with minerals  1 tablet Oral Daily  . pantoprazole  40 mg Oral Daily  . rivaroxaban  20 mg Oral Q supper  . zinc sulfate  220 mg Oral Daily   Continuous Infusions: . sodium chloride 10 mL/hr at 02/21/21 0810   PRN Meds:.acetaminophen **OR** acetaminophen, albuterol, alum & mag hydroxide-simeth, bisacodyl, guaiFENesin-dextromethorphan, magnesium citrate, ondansetron **OR** ondansetron (ZOFRAN) IV, oxyCODONE-acetaminophen, phenol, polyethylene glycol, zolpidem   PHYSICAL EXAM: Vital signs: Vitals:   02/22/21 0249 02/22/21 0340  02/22/21 0802 02/22/21 1222  BP:  93/69 93/80 94/79   Pulse:  68 78 93  Resp:  15 16 15   Temp:  97.7 F (36.5 C) 97.6 F (36.4 C) 97.6 F (36.4 C)  TempSrc:  Axillary Oral Oral  SpO2:  94% 97% 100%  Weight: 111.4 kg     Height:       Filed Weights   02/20/21 0500 02/21/21 0436 02/22/21 0249  Weight: 103 kg 111.4 kg 111.4 kg   Body mass index is 30.7 kg/m.   Gen Exam:Alert awake-not in any distress HEENT:atraumatic, normocephalic Chest: B/L clear to auscultation anteriorly CVS:S1S2 regular Abdomen:soft non tender, non distended Extremities:no edema-left TMA. Neurology: Non focal Skin: no  rash  I have personally reviewed following labs and imaging studies  LABORATORY DATA: CBC: Recent Labs  Lab 02/18/21 0121 02/19/21 0511 02/20/21 0457 02/21/21 0124 02/22/21 0252  WBC 12.4* 13.4* 14.2* 13.0* 9.5  HGB 13.2 12.8* 12.4* 12.6* 11.9*  HCT 39.1 37.8* 37.7* 37.7* 36.3*  MCV 82.1 82.2 84.3 83.2 84.0  PLT 199 183 163 140* 130*    Basic Metabolic Panel: Recent Labs  Lab 02/17/21 0508 02/17/21 1604 02/18/21 0121 02/18/21 0940 02/19/21 0511 02/19/21 1629 02/20/21 0457 02/21/21 0124 02/22/21 0252  NA 122* 123* 123*   < > 123* 124* 126* 127* 127*  K 3.7 3.2* 3.4*   < > 4.3 4.4 4.1 5.3* 4.2  CL 90* 89* 90*   < > 90* 92* 90* 95* 93*  CO2 22 23 20*   < > 20* 20* 21* 12* 22  GLUCOSE 110* 157* 102*   < > 127* 155* 86 103* 108*  BUN 18 19 17    < > 17 16 17 20  24*  CREATININE 1.14 1.05 1.06   < > 1.19 1.03 1.05 1.07 1.38*  CALCIUM 8.0* 7.8* 7.9*   < > 8.0* 8.3* 8.3* 8.2* 8.5*  MG 1.8 1.5* 1.8  --  1.7  --  2.0 2.1  --   PHOS 2.1*  --  2.5  --   --   --   --   --   --    < > = values in this interval not displayed.    GFR: Estimated Creatinine Clearance: 77.7 mL/min (A) (by C-G formula based on SCr of 1.38 mg/dL (H)).  Liver Function Tests: Recent Labs  Lab 02/22/21 0252  AST 231*  ALT 259*  ALKPHOS 85  BILITOT 2.5*  PROT 6.0*  ALBUMIN 2.4*   No results for input(s): LIPASE, AMYLASE in the last 168 hours. Recent Labs  Lab 02/16/21 0622  AMMONIA 27    Coagulation Profile: No results for input(s): INR, PROTIME in the last 168 hours.  Cardiac Enzymes: Recent Labs  Lab 02/18/21 0940  CKTOTAL 45*    BNP (last 3 results) No results for input(s): PROBNP in the last 8760 hours.  Lipid Profile: No results for input(s): CHOL, HDL, LDLCALC, TRIG, CHOLHDL, LDLDIRECT in the last 72 hours.  Thyroid Function Tests: No results for input(s): TSH, T4TOTAL, FREET4, T3FREE, THYROIDAB in the last 72 hours.  Anemia Panel: No results for input(s): VITAMINB12,  FOLATE, FERRITIN, TIBC, IRON, RETICCTPCT in the last 72 hours.  Urine analysis: No results found for: COLORURINE, APPEARANCEUR, LABSPEC, PHURINE, GLUCOSEU, HGBUR, BILIRUBINUR, KETONESUR, PROTEINUR, UROBILINOGEN, NITRITE, LEUKOCYTESUR  Sepsis Labs: Lactic Acid, Venous    Component Value Date/Time   LATICACIDVEN 1.6 02/15/2021 0250    MICROBIOLOGY: Recent Results (from the past 240 hour(s))  Surgical pcr  screen     Status: Abnormal   Collection Time: 02/14/21  8:00 PM   Specimen: Nasal Mucosa; Nasal Swab  Result Value Ref Range Status   MRSA, PCR NEGATIVE NEGATIVE Final   Staphylococcus aureus POSITIVE (A) NEGATIVE Final    Comment: (NOTE) The Xpert SA Assay (FDA approved for NASAL specimens in patients 7 years of age and older), is one component of a comprehensive surveillance program. It is not intended to diagnose infection nor to guide or monitor treatment. Performed at [facility], [address] [place], [place] [address]   SARS Coronavirus 2 by RT PCR (hospital order, performed in [facility] hospital lab) Nasopharyngeal Nasopharyngeal Swab     Status: None   Collection Time: 02/20/21  8:03 AM   Specimen: Nasopharyngeal Swab  Result Value Ref Range Status   SARS Coronavirus 2 NEGATIVE NEGATIVE Final    Comment: (NOTE) SARS-CoV-2 target nucleic acids are NOT DETECTED.  The SARS-CoV-2 RNA is generally detectable in upper and lower respiratory specimens during the acute phase of infection. The lowest concentration of SARS-CoV-2 viral copies this assay can detect is 250 copies / mL. A negative result does not preclude SARS-CoV-2 infection and should not be used as the sole basis for treatment or other patient management decisions.  A negative result may occur with improper specimen collection / handling, submission of specimen other than nasopharyngeal swab, presence of viral mutation(s) within the areas targeted by this assay, and inadequate number of viral  copies (<250 copies / mL). A negative result must be combined with clinical observations, patient history, and epidemiological information.  Fact Sheet for Patients:   BoilerBrush.com.cy  Fact Sheet for Healthcare Providers: https://pope.com/  This test is not yet approved or  cleared by the [place] FDA and has been authorized for detection and/or diagnosis of SARS-CoV-2 by FDA under an Emergency Use Authorization (EUA).  This EUA will remain in effect (meaning this test can be used) for the duration of the COVID-19 declaration under Section 564(b)(1) of the Act, 21 U.S.C. section 360bbb-3(b)(1), unless the authorization is terminated or revoked sooner.  Performed at [facility], [address], [place], [place] [address]     RADIOLOGY STUDIES/RESULTS: No results found.   LOS: 8 days   Jeoffrey Massed, MD  [facility]    To contact the attending provider between 7A-7P or the covering provider during after hours 7P-7A, please log into the web site [email or web address] and access using universal [place] password for that web site. If you do not have the password, please call the hospital operator.  02/22/2021, 12:54 PM

## 2021-02-22 NOTE — Progress Notes (Addendum)
Progress Note  Patient Name: Fernando Maddox Date of Encounter: 02/22/2021  [facility] Cardiologist: Garwin Brothers, MD   Subjective   Patient sleepy during exam. Denies chest pain or SOB. Remains in SR.   Inpatient Medications    Scheduled Meds: . amiodarone  200 mg Oral BID  . vitamin C  1,000 mg Oral Daily  . Chlorhexidine Gluconate Cloth  6 each Topical Daily  . docusate sodium  100 mg Oral BID  . famotidine  20 mg Oral Daily  . feeding supplement (GLUCERNA SHAKE)  237 mL Oral BID BM  . insulin aspart  0-15 Units Subcutaneous TID WC  . insulin aspart  0-5 Units Subcutaneous QHS  . mouth rinse  15 mL Mouth Rinse BID  . multivitamin with minerals  1 tablet Oral Daily  . pantoprazole  40 mg Oral Daily  . rivaroxaban  20 mg Oral Q supper  . zinc sulfate  220 mg Oral Daily   Continuous Infusions: . sodium chloride 10 mL/hr at 02/21/21 0810   PRN Meds: acetaminophen **OR** acetaminophen, albuterol, alum & mag hydroxide-simeth, bisacodyl, guaiFENesin-dextromethorphan, magnesium citrate, ondansetron **OR** ondansetron (ZOFRAN) IV, oxyCODONE-acetaminophen, phenol, polyethylene glycol, zolpidem   Vital Signs    Vitals:   02/21/21 2000 02/22/21 0020 02/22/21 0249 02/22/21 0340  BP: 113/79 110/80  93/69  Pulse: 87 73  68  Resp: 19 20  15   Temp: 98 F (36.7 C) 98 F (36.7 C)  97.7 F (36.5 C)  TempSrc: Axillary Axillary  Axillary  SpO2: 92% 95%  94%  Weight:   111.4 kg   Height:        Intake/Output Summary (Last 24 hours) at 02/22/2021 0746 Last data filed at 02/21/2021 2009 Gross per 24 hour  Intake 2267.04 ml  Output --  Net 2267.04 ml   Last 3 Weights 02/22/2021 02/21/2021 02/20/2021  Weight (lbs) 245 lb 9.5 oz 245 lb 9.5 oz 227 lb 1.2 oz  Weight (kg) 111.4 kg 111.4 kg 103 kg      Telemetry    NSR HR 80-90s - Personally Reviewed  ECG    No new tracing - Personally Reviewed  Physical Exam   GEN: No acute distress.   Neck: No JVD Cardiac: RRR, no  murmurs, rubs, or gallops.  Respiratory: Clear to auscultation bilaterally. GI: Soft, nontender, non-distended  MS: No edema; No deformity. Neuro:  Nonfocal  Psych: Normal affect   Labs    High Sensitivity Troponin:  No results for input(s): TROPONINIHS in the last 720 hours.    Chemistry Recent Labs  Lab 02/20/21 0457 02/21/21 0124 02/22/21 0252  NA 126* 127* 127*  K 4.1 5.3* 4.2  CL 90* 95* 93*  CO2 21* 12* 22  GLUCOSE 86 103* 108*  BUN 17 20 24*  CREATININE 1.05 1.07 1.38*  CALCIUM 8.3* 8.2* 8.5*  PROT  --   --  6.0*  ALBUMIN  --   --  2.4*  AST  --   --  231*  ALT  --   --  259*  ALKPHOS  --   --  85  BILITOT  --   --  2.5*  GFRNONAA >60 >60 59*  ANIONGAP 15 20* 12     Hematology Recent Labs  Lab 02/20/21 0457 02/21/21 0124 02/22/21 0252  WBC 14.2* 13.0* 9.5  RBC 4.47 4.53 4.32  HGB 12.4* 12.6* 11.9*  HCT 37.7* 37.7* 36.3*  MCV 84.3 83.2 84.0  MCH 27.7 27.8 27.5  MCHC 32.9 33.4 32.8  RDW 16.9* 17.1* 17.1*  PLT 163 140* 130*    BNPNo results for input(s): BNP, PROBNP in the last 168 hours.   DDimer No results for input(s): DDIMER in the last 168 hours.   Radiology    No results found.  Cardiac Studies    Echocardiogram 02/15/21: 1. Left ventricular ejection fraction, by estimation, is 20 to 25%. The  left ventricle has severely decreased function. The left ventricle  demonstrates global hypokinesis. The left ventricular internal cavity size  was moderately dilated. Left  ventricular diastolic function could not be evaluated. There is the  interventricular septum is flattened in systole and diastole, consistent  with right ventricular pressure and volume overload.  2. Right ventricular systolic function is severely reduced. The right  ventricular size is moderately enlarged.  3. Left atrial size was moderately dilated.  4. Right atrial size was moderately dilated.  5. The mitral valve is normal in structure. Mild to moderate mitral  valve  regurgitation.  6. Tricuspid valve regurgitation is mild to moderate.  7. The aortic valve is tricuspid. There is mild calcification of the  aortic valve. Aortic valve regurgitation is not visualized.  8. Aortic dilatation noted. There is borderline dilatation of the  ascending aorta, measuring 40 mm.  9. The inferior vena cava is dilated in size with <50% respiratory  variability, suggesting right atrial pressure of 15 mmHg.   Patient Profile     60 y.o. male pmh of CAD (possible prior stentint), permanent afib chronic combined CHF, HTN, HLD, DM2, PAD s/p partial great toe amputation RLA, CVA, OSA on CPAP, toabacco use who ise being seen for CHF and afib. transferred from [facility]  Assessment & Plan    Acute on chronic combined CHF with new RV failure - Unknown etiology of CM, possible tachy-mediated with h/o afib - echo this admission showed EF 20-25% with severely reduced RV systolic function inially requiring pressor support for septic shock.  - IV lasix, last does 4/24 - PTA metoprolol and spiro, Bps still soft, resume when able. - On exam still appears relatively euvolemic, breathing is stable.  - Net almost 0 at this time - IV lasix 40mg  administered 4/28, creatinine/bun with some elevation. Would restart home lasix 40mg  tomorrow.   Permanent Afib - Amio IV>PO. Plan for amio 200mg  BID x 2 weeks then 200mg  daily - Rates controlled in the 80-90s - Xarelto restarted  Septic shock in the setting of gangrenous LLE Left limb ischemia - was breifly on pressors, now off - s/p left transmetatarsal 4/27 - IV abx per primary team  CAD with possible prior stenting - Not on ASA due to DOAC  DM2 - A1C 7.7 this admission - per primary team  PAD - s/p amputation - per ortho team   For questions or updates, please contact [facility] Please consult [email or web address] for contact info under        Signed, Cadence , PA-C  02/22/2021, 7:46 AM    Personally  seen and examined. Agree with above.   Still fairly sleepy.  EF 25%.  IV Lasix administered yesterday, mild creatinine bump.  Serum sodium still remains low 127.  You probably should restart home Lasix 40 mg tomorrow.  Continue with amiodarone.  Rates are controlled with his permanent atrial fibrillation.    , MD

## 2021-02-22 NOTE — Plan of Care (Signed)

## 2021-02-22 NOTE — Progress Notes (Signed)
Physical Therapy Treatment Patient Details Name: Fernando Maddox MRN: [id number] DOB: 09-27-1961 Today's Date: 02/22/2021    History of Present Illness Fernando Maddox is a 60 y.o. male who presents with necrotic ulceration and purulent drainage from the left forefoot involving the great toe second toe and third toe.  Patient is unsure when this started. On 4/27, pt underwent L transmetatarsal amputation. 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 PMH: a fib, DM, GERD, CVA, drug abuse, HTN.    PT Comments    Pt is very lethargic upon arrival. Pt agrees to transfer to recliner, requiring physical assistance for bed mobility and transfers. Pt requires multiple reminders to maintain weightbearing restrictions, still unable to maintain despite cues. Pt requires VC for hand placement during sit>stand transfers. Pt continues to demonstrate deficits in overall strength, endurance, power, activity tolerance, balance, and safety at this time. Pt demonstrates increased work of breathing, limiting session. Pt will benefit from continued acute PT to improve functional mobility, safety, and independent mobility.   Follow Up Recommendations  SNF     Equipment Recommendations  Rolling walker with 5" wheels    Recommendations for Other Services       Precautions / Restrictions Precautions Precautions: Fall;Other (comment) Precaution Comments: wound vac Required Braces or Orthoses: Other Brace Other Brace: post op shoe Restrictions Weight Bearing Restrictions: Yes LLE Weight Bearing: Touchdown weight bearing LLE Partial Weight Bearing Percentage or Pounds: through heel with post op shoe    Mobility  Bed Mobility Overal bed mobility: Needs Assistance Bed Mobility: Supine to Sit     Supine to sit: Min assist;HOB elevated     General bed mobility comments: 1 person handheld assist to come to sit at edge of bed. Repetitive cues to square hips to EOB.    Transfers Overall  transfer level: Needs assistance Equipment used: Rolling walker (2 wheeled) Transfers: Sit to/from [employer] Sit to Stand: +2 physical assistance;Mod assist Stand pivot transfers: Mod assist;+2 physical assistance       General transfer comment: Pt requires cues to limit weightbearing through L LE.  Ambulation/Gait                 Stairs             Wheelchair Mobility    Modified Rankin (Stroke Patients Only)       Balance Overall balance assessment: Needs assistance Sitting-balance support: Feet supported;Bilateral upper extremity supported;No upper extremity supported Sitting balance-Leahy Scale: Fair     Standing balance support: Bilateral upper extremity supported Standing balance-Leahy Scale: Poor Standing balance comment: RW and mod assist x2 to stand.                            Cognition Arousal/Alertness: Lethargic Behavior During Therapy: Flat affect Overall Cognitive Status: Impaired/Different from baseline Area of Impairment: Attention;Following commands;Safety/judgement;Awareness;Problem solving;Orientation                 Orientation Level: Disoriented to;Time;Situation Current Attention Level: Sustained   Following Commands: Follows one step commands with increased time;Follows one step commands inconsistently Safety/Judgement: Decreased awareness of safety;Decreased awareness of deficits Awareness: Intellectual Problem Solving: Slow processing;Decreased initiation;Difficulty sequencing;Requires verbal cues;Requires tactile cues General Comments: Pt continues to require constant/repetitive cues for bed  mobility and transfers.      Exercises      General Comments General comments (skin integrity, edema, etc.): Pt on RA upon  arrival, 3L oxygen running through [place] beside pt. Pt cued to put [place] back on due to labored breathing, requires assistance to do so. Saturations fluctuating with poor wavelengths  noted on pleth. Encouraged slow deep breathing to reduce respiratory rate.      Pertinent Vitals/Pain Pain Assessment: No/denies pain    Home Living                      Prior Function            PT Goals (current goals can now be found in the care plan section) Acute Rehab PT Goals Patient Stated Goal: none stated Progress towards PT goals: Not progressing toward goals - comment (Pt lethargic, limiting goal progression.)    Frequency    Min 3X/week      PT Plan Current plan remains appropriate    Co-evaluation              AM-PAC PT "6 Clicks" Mobility   Outcome Measure  Help needed turning from your back to your side while in a flat bed without using bedrails?: A Little Help needed moving from lying on your back to sitting on the side of a flat bed without using bedrails?: A Little Help needed moving to and from a bed to a chair (including a wheelchair)?: A Lot Help needed standing up from a chair using your arms (e.g., wheelchair or bedside chair)?: A Lot Help needed to walk in hospital room?: Total Help needed climbing 3-5 steps with a railing? : Total 6 Click Score: 12    End of Session Equipment Utilized During Treatment: Gait belt Activity Tolerance: Patient limited by lethargy Patient left: in chair;with call bell/phone within reach;with chair alarm set Nurse Communication: Mobility status PT Visit Diagnosis: Other abnormalities of gait and mobility (R26.89);Unsteadiness on feet (R26.81);Difficulty in walking, not elsewhere classified (R26.2)     Time: 9735-3299 PT Time Calculation (min) (ACUTE ONLY): 32 min  Charges:  $Therapeutic Activity: 23-37 mins                     Acute Rehab  Pager: [phone]    Waldemar Dickens, SPT  02/22/2021, 12:16 PM

## 2021-02-23 DIAGNOSIS — R6521 Severe sepsis with septic shock: Secondary | ICD-10-CM | POA: Diagnosis not present

## 2021-02-23 DIAGNOSIS — A419 Sepsis, unspecified organism: Secondary | ICD-10-CM | POA: Diagnosis not present

## 2021-02-23 DIAGNOSIS — L97524 Non-pressure chronic ulcer of other part of left foot with necrosis of bone: Secondary | ICD-10-CM | POA: Diagnosis not present

## 2021-02-23 LAB — COMPREHENSIVE METABOLIC PANEL
ALT: 222 U/L — ABNORMAL HIGH (ref 0–44)
AST: 144 U/L — ABNORMAL HIGH (ref 15–41)
Albumin: 2.5 g/dL — ABNORMAL LOW (ref 3.5–5.0)
Alkaline Phosphatase: 101 U/L (ref 38–126)
Anion gap: 11 (ref 5–15)
BUN: 25 mg/dL — ABNORMAL HIGH (ref 6–20)
CO2: 23 mmol/L (ref 22–32)
Calcium: 8.2 mg/dL — ABNORMAL LOW (ref 8.9–10.3)
Chloride: 93 mmol/L — ABNORMAL LOW (ref 98–111)
Creatinine, Ser: 1.25 mg/dL — ABNORMAL HIGH (ref 0.61–1.24)
GFR, Estimated: >60 mL/min (ref >60)
Glucose, Bld: 82 mg/dL (ref 70–99)
Potassium: 3.9 mmol/L (ref 3.5–5.1)
Sodium: 127 mmol/L — ABNORMAL LOW (ref 135–145)
Total Bilirubin: 2.2 mg/dL — ABNORMAL HIGH (ref 0.3–1.2)
Total Protein: 6.3 g/dL — ABNORMAL LOW (ref 6.5–8.1)

## 2021-02-23 LAB — CBC
HCT: 36.2 % — ABNORMAL LOW (ref 39.0–52.0)
Hemoglobin: 12.2 g/dL — ABNORMAL LOW (ref 13.0–17.0)
MCH: 27.7 pg (ref 26.0–34.0)
MCHC: 33.7 g/dL (ref 30.0–36.0)
MCV: 82.1 fL (ref 80.0–100.0)
Platelets: 120 10*3/uL — ABNORMAL LOW (ref 150–400)
RBC: 4.41 MIL/uL (ref 4.22–5.81)
RDW: 17.7 % — ABNORMAL HIGH (ref 11.5–15.5)
WBC: 11.2 10*3/uL — ABNORMAL HIGH (ref 4.0–10.5)
nRBC: 0 % (ref 0.0–0.2)

## 2021-02-23 LAB — GLUCOSE, CAPILLARY
Glucose-Capillary: 101 mg/dL — ABNORMAL HIGH (ref 70–99)
Glucose-Capillary: 163 mg/dL — ABNORMAL HIGH (ref 70–99)
Glucose-Capillary: 154 mg/dL — ABNORMAL HIGH (ref 70–99)
Glucose-Capillary: 170 mg/dL — ABNORMAL HIGH (ref 70–99)

## 2021-02-23 MED ORDER — SPIRONOLACTONE 12.5 MG HALF TABLET
12.5000 mg | ORAL_TABLET | Freq: Every day | ORAL | Status: DC
  Administered 2021-02-23 – 2021-02-24 (×2): 12.5 mg via ORAL
  Filled 2021-02-23 (×5): qty 1

## 2021-02-23 MED ORDER — FUROSEMIDE 40 MG PO TABS
40.0000 mg | ORAL_TABLET | Freq: Every day | ORAL | Status: DC
  Administered 2021-02-23 – 2021-02-25 (×3): 40 mg via ORAL
  Filled 2021-02-23 (×4): qty 1

## 2021-02-23 NOTE — Progress Notes (Signed)
Progress Note  Patient Name: Fernando Maddox Date of Encounter: 02/23/2021  [facility] Cardiologist: Garwin Brothers, MD   Subjective   Feeling little better today.  No chest pain, no significant shortness of breath.  Inpatient Medications    Scheduled Meds: . vitamin C  1,000 mg Oral Daily  . Chlorhexidine Gluconate Cloth  6 each Topical Daily  . docusate sodium  100 mg Oral BID  . famotidine  20 mg Oral Daily  . feeding supplement (GLUCERNA SHAKE)  237 mL Oral BID BM  . furosemide  40 mg Oral Daily  . insulin aspart  0-15 Units Subcutaneous TID WC  . insulin aspart  0-5 Units Subcutaneous QHS  . mouth rinse  15 mL Mouth Rinse BID  . multivitamin with minerals  1 tablet Oral Daily  . pantoprazole  40 mg Oral Daily  . rivaroxaban  20 mg Oral Q supper  . spironolactone  12.5 mg Oral Daily  . zinc sulfate  220 mg Oral Daily   Continuous Infusions: . sodium chloride 10 mL/hr at 02/21/21 0810   PRN Meds: acetaminophen **OR** acetaminophen, albuterol, alum & mag hydroxide-simeth, bisacodyl, guaiFENesin-dextromethorphan, magnesium citrate, ondansetron **OR** ondansetron (ZOFRAN) IV, oxyCODONE-acetaminophen, phenol, polyethylene glycol, zolpidem   Vital Signs    Vitals:   02/22/21 1955 02/23/21 0325 02/23/21 0806 02/23/21 1253  BP: 117/73 103/79 99/75 106/81  Pulse: 73 92 82 88  Resp: 16 16 16 20   Temp: (!) 97.3 F (36.3 C) 97.9 F (36.6 C) 98.4 F (36.9 C) 98.5 F (36.9 C)  TempSrc: Oral Oral Oral Oral  SpO2: 96% 97% 94% 95%  Weight:  111.4 kg    Height:        Intake/Output Summary (Last 24 hours) at 02/23/2021 1308 Last data filed at 02/23/2021 0600 Gross per 24 hour  Intake 360 ml  Output 850 ml  Net -490 ml   Last 3 Weights 02/23/2021 02/22/2021 02/21/2021  Weight (lbs) 245 lb 9.5 oz 245 lb 9.5 oz 245 lb 9.5 oz  Weight (kg) 111.4 kg 111.4 kg 111.4 kg     Physical Exam   GEN: No acute distress.   Neck: No JVD Cardiac: RRR, no murmurs, rubs, or  gallops.  Respiratory: Clear to auscultation bilaterally. GI: Soft, nontender, non-distended  MS: No edema; left-sided partial foot amputation. Neuro:  Nonfocal  Psych: Normal affect   Labs    High Sensitivity Troponin:  No results for input(s): TROPONINIHS in the last 720 hours.    Chemistry Recent Labs  Lab 02/21/21 0124 02/22/21 0252 02/23/21 0037  NA 127* 127* 127*  K 5.3* 4.2 3.9  CL 95* 93* 93*  CO2 12* 22 23  GLUCOSE 103* 108* 82  BUN 20 24* 25*  CREATININE 1.07 1.38* 1.25*  CALCIUM 8.2* 8.5* 8.2*  PROT  --  6.0* 6.3*  ALBUMIN  --  2.4* 2.5*  AST  --  231* 144*  ALT  --  259* 222*  ALKPHOS  --  85 101  BILITOT  --  2.5* 2.2*  GFRNONAA >60 59* >60  ANIONGAP 20* 12 11     Hematology Recent Labs  Lab 02/21/21 0124 02/22/21 0252 02/23/21 0037  WBC 13.0* 9.5 11.2*  RBC 4.53 4.32 4.41  HGB 12.6* 11.9* 12.2*  HCT 37.7* 36.3* 36.2*  MCV 83.2 84.0 82.1  MCH 27.8 27.5 27.7  MCHC 33.4 32.8 33.7  RDW 17.1* 17.1* 17.7*  PLT 140* 130* 120*      Patient  Profile     60 y.o. male with echocardiogram demonstrating EF of 20 to 25%, permanent atrial fibrillation, elevated liver enzymes stroke, obstructive sleep apnea, tobacco use  Assessment & Plan    Permanent atrial fibrillation - Previously had trouble with rate control therefore amiodarone was utilized because of hypotension.  Amnio currently on hold secondary to elevated liver enzymes however elevated liver enzymes may be compatible with hepatic congestion in the setting of recent sepsis/heart failure/shock.  Hyponatremia - Systolic heart failure certainly contributing, agree with p.o. Lasix and spironolactone.  Blood pressure too soft to initiate beta-blocker or ACE inhibitor/ARB.  Additionally he has had recent uptick in creatinine.  Gangrenous left lower extremity limb ischemia status post transmetatarsal amputation. - Slowly improving.   For questions or updates, please contact [facility] Please  consult [email or web address] for contact info under        Signed, Donato Schultz, MD  02/23/2021, 1:08 PM

## 2021-02-23 NOTE — Progress Notes (Signed)
Patient ID: Fernando Maddox, male   DOB: 1961/03/04, 60 y.o.   MRN: [id number] Dressing is clean and dry left transmetatarsal amputation.  Continue touchdown weightbearing with the postoperative shoe.  We will remove the wound VAC after 7 days of usage.

## 2021-02-23 NOTE — Progress Notes (Addendum)
PROGRESS NOTE        PATIENT DETAILS Name: Fernando Maddox Age: 60 y.o. Sex: male Date of Birth: 1961/01/03 Admit Date: 02/14/2021 Admitting Physician Charlott Holler, MD PCP:Van Domenic Schwab, MD  Brief Narrative: Patient is a 60 y.o. male with history of HFrEF, permanent atrial fibrillation, CAD, DM-2, known PAD-s/p partial amputation of right great toe presented to [facility] as a transfer from [facility] ED for septic shock in the setting of critical right lower extremity ischemia.  Significant events: 4/21>> transferred to [facility] from [facility]- found down-gangrene is right lower extremity-CT angio performed which showed concern for acute right arterial occlusion. 4/24>> transfer to [facility].  Significant studies: 4/21>> CT angio abdominal aorta with iliofemoral runoff: No opacification beyond mid SFA-suspicious for arterial embolism.  L1, L2, L3 and L4 vertebral bodies compression fractures.  Mildly enlarged left retroperitoneal, pelvic and inguinal lymph nodes. 4/21>> CT chest: No acute pulmonary disease. 4/21>> CT head: No acute intracranial abnormality 4/21>> CT C-spine: No acute fractures. 4/22>> chest x-ray: No active disease 4/22>> right lower extremity arterial duplex: No evidence of stenosis or occlusive disease. 4/22>> Echo: EF 20-25%, RV systolic function severely reduced  Antimicrobial therapy: Vancomycin: 4/21>>4/24 Zosyn: 4/21>> 4/24 Unasyn: 4/25>>4/28  Microbiology data: None  Procedures : 4/27>> left transmetatarsal amputation  Consults: Vascular surgery, PCCM, orthopedics, cardiology  DVT Prophylaxis : rivaroxaban (XARELTO) tablet 20 mg IV heparin  Subjective: Lying comfortably-awake-alert-answers questions appropriately.  Assessment/Plan: Septic shock due to critical left limb ischemia with left forefoot ulceration/gangrene: Sepsis physiology resolved-briefly required pressors while at [facility] health-evaluated by orthopedics-patient is s/p left  transmetatarsal amputation on 4/27.  IV antibiotics will be discontinued on 4/28.  Continue supportive care-orthopedics following-okay to be touchdown weightbearing on his heel.  Acute metabolic encephalopathy: Due to above-found unresponsive at home-slowly improving.  Hyponatremia: Suspect due to CHF-continue fluid restriction-no response to IV Lasix-due to bump in creatinine-Lasix held-we will resume oral Lasix today-if no response over the next few days-May need initiation of Samsca.    Hyperkalemia: Resolved  Transaminitis: Concern for either hepatic congestion or amiodarone induced hepatitis.  Discussed with cardiology on 4/29-stop amiodarone.  LFTs now downtrending-continue to follow-await hepatitis serology.  Chronic atrial fibrillation with RVR: Rate controlled-initially required IV amiodarone infusion-subsequently maintained on oral amiodarone that was discontinued on 4/29 due to bump in LFTs.  Remains on Xarelto.   Per CTA report-suspicion that critical ischemia could have been from an embolic event-unclear how compliant patient has been with anticoagulation in the past (mother on 4/29 acknowledges noncompliance to medication).  Acute on chronic systolic/diastolic heart failure/new RV failure: Volume status is improved-cardiology following-defer initiation of CHF medications to cardiology service.  Due to bump in LFTs-concern for hepatic congestion in the setting of biventricular heart failure.  Starting oral Lasix and Aldactone today-follow.  See above regarding hyponatremia.  History of CAD: No anginal symptoms-not on aspirin as patient on anticoagulation  DM-2 (A1c 7.7 on 4/25): Monitor closely with SSI.  Follow CBGs closely.  Resume metformin on discharge.  Recent Labs    02/22/21 1708 02/22/21 1953 02/23/21 0804  GLUCAP 242* 330* 101*   Prior history of polysubstance use: UDS 3 years back positive for amphetamine/cannabinoids.  ?  OSA: Sleep study outpatient.   Nutrition  Problem: Nutrition Problem: Moderate Malnutrition Etiology: chronic illness (CHF) Signs/Symptoms: mild muscle depletion,moderate muscle depletion,mild fat depletion,moderate fat depletion  Interventions: MVI,Glucerna shake,Magic cup   Diet: Diet Order            Diet Carb Modified Fluid consistency: Thin; Room service appropriate? No; Fluid restriction: 1200 mL Fluid  Diet effective now                  Code Status: Full code   Family Communication: Called mother-[name]-[phone]-updated over the phone on 4/29.  Disposition Plan: Status is: Inpatient  Remains inpatient appropriate because:Inpatient level of care appropriate due to severity of illness   Dispo: The patient is from: Home              Anticipated d/c is to: tbd              Patient currently is not medically stable to d/c.   Difficult to place patient No   Barriers to Discharge: Left leg gangrene-s/p transmetatarsal amputation-hyponatremia-likely will need SNF.   Antimicrobial agents: Anti-infectives (From admission, onward)   Start     Dose/Rate Route Frequency Ordered Stop   02/20/21 1100  ceFAZolin (ANCEF) IVPB 2g/100 mL premix        2 g 200 mL/hr over 30 Minutes Intravenous To Short Stay 02/20/21 0754 02/20/21 1254   02/18/21 1745  Ampicillin-Sulbactam (UNASYN) 3 g in sodium chloride 0.9 % 100 mL IVPB        3 g 200 mL/hr over 30 Minutes Intravenous Every 6 hours 02/18/21 1645 02/21/21 2359   02/18/21 1200  metroNIDAZOLE (FLAGYL) IVPB 500 mg  Status:  Discontinued        500 mg 100 mL/hr over 60 Minutes Intravenous Every 8 hours 02/18/21 1033 02/18/21 1043   02/18/21 1130  cefTRIAXone (ROCEPHIN) 2 g in sodium chloride 0.9 % 100 mL IVPB  Status:  Discontinued        2 g 200 mL/hr over 30 Minutes Intravenous Every 24 hours 02/18/21 1033 02/18/21 1043   02/15/21 0230  vancomycin (VANCOREADY) IVPB 1250 mg/250 mL  Status:  Discontinued        1,250 mg 166.7 mL/hr over 90 Minutes  Intravenous Every 12 hours 02/15/21 0140 02/18/21 0735   02/15/21 0030  piperacillin-tazobactam (ZOSYN) IVPB 3.375 g  Status:  Discontinued        3.375 g 12.5 mL/hr over 240 Minutes Intravenous Every 8 hours 02/14/21 2338 02/18/21 1032       Time spent: 25 minutes-Greater than 50% of this time was spent in counseling, explanation of diagnosis, planning of further management, and coordination of care.  MEDICATIONS: Scheduled Meds: . vitamin C  1,000 mg Oral Daily  . Chlorhexidine Gluconate Cloth  6 each Topical Daily  . docusate sodium  100 mg Oral BID  . famotidine  20 mg Oral Daily  . feeding supplement (GLUCERNA SHAKE)  237 mL Oral BID BM  . insulin aspart  0-15 Units Subcutaneous TID WC  . insulin aspart  0-5 Units Subcutaneous QHS  . mouth rinse  15 mL Mouth Rinse BID  . multivitamin with minerals  1 tablet Oral Daily  . pantoprazole  40 mg Oral Daily  . rivaroxaban  20 mg Oral Q supper  . zinc sulfate  220 mg Oral Daily   Continuous Infusions: . sodium chloride 10 mL/hr at 02/21/21 0810   PRN Meds:.acetaminophen **OR** acetaminophen, albuterol, alum & mag hydroxide-simeth, bisacodyl, guaiFENesin-dextromethorphan, magnesium citrate, ondansetron **OR** ondansetron (ZOFRAN) IV, oxyCODONE-acetaminophen, phenol, polyethylene glycol, zolpidem   PHYSICAL EXAM: Vital signs: Vitals:   02/22/21 1709 02/22/21 1955  02/23/21 0325 02/23/21 0806  BP: 95/74 117/73 103/79 99/75  Pulse: 86 73 92 82  Resp: 17 16 16 16   Temp: 97.6 F (36.4 C) (!) 97.3 F (36.3 C) 97.9 F (36.6 C) 98.4 F (36.9 C)  TempSrc: Axillary Oral Oral Oral  SpO2: 91% 96% 97% 94%  Weight:   111.4 kg   Height:       Filed Weights   02/21/21 0436 02/22/21 0249 02/23/21 0325  Weight: 111.4 kg 111.4 kg 111.4 kg   Body mass index is 30.7 kg/m.   Gen Exam:Alert awake-not in any distress HEENT:atraumatic, normocephalic Chest: B/L clear to auscultation anteriorly CVS:S1S2 regular Abdomen:soft non tender,  non distended Extremities: Left TMA-no edema. Neurology: Non focal Skin: no rash I have personally reviewed following labs and imaging studies  LABORATORY DATA: CBC: Recent Labs  Lab 02/19/21 0511 02/20/21 0457 02/21/21 0124 02/22/21 0252 02/23/21 0037  WBC 13.4* 14.2* 13.0* 9.5 11.2*  HGB 12.8* 12.4* 12.6* 11.9* 12.2*  HCT 37.8* 37.7* 37.7* 36.3* 36.2*  MCV 82.2 84.3 83.2 84.0 82.1  PLT 183 163 140* 130* 120*    Basic Metabolic Panel: Recent Labs  Lab 02/17/21 0508 02/17/21 1604 02/18/21 0121 02/18/21 0940 02/19/21 0511 02/19/21 1629 02/20/21 0457 02/21/21 0124 02/22/21 0252 02/23/21 0037  NA 122* 123* 123*   < > 123* 124* 126* 127* 127* 127*  K 3.7 3.2* 3.4*   < > 4.3 4.4 4.1 5.3* 4.2 3.9  CL 90* 89* 90*   < > 90* 92* 90* 95* 93* 93*  CO2 22 23 20*   < > 20* 20* 21* 12* 22 23  GLUCOSE 110* 157* 102*   < > 127* 155* 86 103* 108* 82  BUN 18 19 17    < > 17 16 17 20  24* 25*  CREATININE 1.14 1.05 1.06   < > 1.19 1.03 1.05 1.07 1.38* 1.25*  CALCIUM 8.0* 7.8* 7.9*   < > 8.0* 8.3* 8.3* 8.2* 8.5* 8.2*  MG 1.8 1.5* 1.8  --  1.7  --  2.0 2.1  --   --   PHOS 2.1*  --  2.5  --   --   --   --   --   --   --    < > = values in this interval not displayed.    GFR: Estimated Creatinine Clearance: 85.8 mL/min (A) (by C-G formula based on SCr of 1.25 mg/dL (H)).  Liver Function Tests: Recent Labs  Lab 02/22/21 0252 02/23/21 0037  AST 231* 144*  ALT 259* 222*  ALKPHOS 85 101  BILITOT 2.5* 2.2*  PROT 6.0* 6.3*  ALBUMIN 2.4* 2.5*   No results for input(s): LIPASE, AMYLASE in the last 168 hours. No results for input(s): AMMONIA in the last 168 hours.  Coagulation Profile: No results for input(s): INR, PROTIME in the last 168 hours.  Cardiac Enzymes: Recent Labs  Lab 02/18/21 0940  CKTOTAL 45*    BNP (last 3 results) No results for input(s): PROBNP in the last 8760 hours.  Lipid Profile: No results for input(s): CHOL, HDL, LDLCALC, TRIG, CHOLHDL, LDLDIRECT in  the last 72 hours.  Thyroid Function Tests: No results for input(s): TSH, T4TOTAL, FREET4, T3FREE, THYROIDAB in the last 72 hours.  Anemia Panel: No results for input(s): VITAMINB12, FOLATE, FERRITIN, TIBC, IRON, RETICCTPCT in the last 72 hours.  Urine analysis: No results found for: COLORURINE, APPEARANCEUR, LABSPEC, PHURINE, GLUCOSEU, HGBUR, BILIRUBINUR, KETONESUR, PROTEINUR, UROBILINOGEN, NITRITE, LEUKOCYTESUR  Sepsis Labs: Lactic Acid,  Venous    Component Value Date/Time   LATICACIDVEN 1.6 02/15/2021 0250    MICROBIOLOGY: Recent Results (from the past 240 hour(s))  Surgical pcr screen     Status: Abnormal   Collection Time: 02/14/21  8:00 PM   Specimen: Nasal Mucosa; Nasal Swab  Result Value Ref Range Status   MRSA, PCR NEGATIVE NEGATIVE Final   Staphylococcus aureus POSITIVE (A) NEGATIVE Final    Comment: (NOTE) The Xpert SA Assay (FDA approved for NASAL specimens in patients 25 years of age and older), is one component of a comprehensive surveillance program. It is not intended to diagnose infection nor to guide or monitor treatment. Performed at [facility], [address], [place], [place] [address]   SARS Coronavirus 2 by RT PCR (hospital order, performed in [facility] hospital lab) Nasopharyngeal Nasopharyngeal Swab     Status: None   Collection Time: 02/20/21  8:03 AM   Specimen: Nasopharyngeal Swab  Result Value Ref Range Status   SARS Coronavirus 2 NEGATIVE NEGATIVE Final    Comment: (NOTE) SARS-CoV-2 target nucleic acids are NOT DETECTED.  The SARS-CoV-2 RNA is generally detectable in upper and lower respiratory specimens during the acute phase of infection. The lowest concentration of SARS-CoV-2 viral copies this assay can detect is 250 copies / mL. A negative result does not preclude SARS-CoV-2 infection and should not be used as the sole basis for treatment or other patient management decisions.  A negative result may occur with improper  specimen collection / handling, submission of specimen other than nasopharyngeal swab, presence of viral mutation(s) within the areas targeted by this assay, and inadequate number of viral copies (<250 copies / mL). A negative result must be combined with clinical observations, patient history, and epidemiological information.  Fact Sheet for Patients:   BoilerBrush.com.cy  Fact Sheet for Healthcare Providers: https://pope.com/  This test is not yet approved or  cleared by the [place] FDA and has been authorized for detection and/or diagnosis of SARS-CoV-2 by FDA under an Emergency Use Authorization (EUA).  This EUA will remain in effect (meaning this test can be used) for the duration of the COVID-19 declaration under Section 564(b)(1) of the Act, 21 U.S.C. section 360bbb-3(b)(1), unless the authorization is terminated or revoked sooner.  Performed at [facility], [address] [place], [place] [address]     RADIOLOGY STUDIES/RESULTS: No results found.   LOS: 9 days   Jeoffrey Massed, MD  [facility]    To contact the attending provider between 7A-7P or the covering provider during after hours 7P-7A, please log into the web site [email or web address] and access using universal [place] password for that web site. If you do not have the password, please call the hospital operator.  02/23/2021, 11:37 AM

## 2021-02-23 NOTE — Plan of Care (Signed)

## 2021-02-24 DIAGNOSIS — R6521 Severe sepsis with septic shock: Secondary | ICD-10-CM | POA: Diagnosis not present

## 2021-02-24 DIAGNOSIS — A419 Sepsis, unspecified organism: Secondary | ICD-10-CM | POA: Diagnosis not present

## 2021-02-24 DIAGNOSIS — L97524 Non-pressure chronic ulcer of other part of left foot with necrosis of bone: Secondary | ICD-10-CM | POA: Diagnosis not present

## 2021-02-24 LAB — COMPREHENSIVE METABOLIC PANEL
ALT: 169 U/L — ABNORMAL HIGH (ref 0–44)
AST: 94 U/L — ABNORMAL HIGH (ref 15–41)
Albumin: 2.3 g/dL — ABNORMAL LOW (ref 3.5–5.0)
Alkaline Phosphatase: 96 U/L (ref 38–126)
Anion gap: 10 (ref 5–15)
BUN: 24 mg/dL — ABNORMAL HIGH (ref 6–20)
CO2: 22 mmol/L (ref 22–32)
Calcium: 8.1 mg/dL — ABNORMAL LOW (ref 8.9–10.3)
Chloride: 94 mmol/L — ABNORMAL LOW (ref 98–111)
Creatinine, Ser: 1.28 mg/dL — ABNORMAL HIGH (ref 0.61–1.24)
GFR, Estimated: >60 mL/min (ref >60)
Glucose, Bld: 108 mg/dL — ABNORMAL HIGH (ref 70–99)
Potassium: 4.8 mmol/L (ref 3.5–5.1)
Sodium: 126 mmol/L — ABNORMAL LOW (ref 135–145)
Total Bilirubin: 2 mg/dL — ABNORMAL HIGH (ref 0.3–1.2)
Total Protein: 6 g/dL — ABNORMAL LOW (ref 6.5–8.1)

## 2021-02-24 LAB — GLUCOSE, CAPILLARY
Glucose-Capillary: 123 mg/dL — ABNORMAL HIGH (ref 70–99)
Glucose-Capillary: 173 mg/dL — ABNORMAL HIGH (ref 70–99)
Glucose-Capillary: 178 mg/dL — ABNORMAL HIGH (ref 70–99)
Glucose-Capillary: 150 mg/dL — ABNORMAL HIGH (ref 70–99)

## 2021-02-24 NOTE — Progress Notes (Signed)
PROGRESS NOTE        PATIENT DETAILS Name: Fernando Maddox Age: 60 y.o. Sex: male Date of Birth: 1961/10/03 Admit Date: 02/14/2021 Admitting Physician Charlott Holler, MD PCP:Van Domenic Schwab, MD  Brief Narrative: Patient is a 60 y.o. male with history of HFrEF, permanent atrial fibrillation, CAD, DM-2, known PAD-s/p partial amputation of right great toe presented to [facility] as a transfer from [facility] for septic shock in the setting of critical right lower extremity ischemia.  Significant events: 4/21>> transferred to [facility] from [facility]- found down-gangrene is right lower extremity-CT angio performed which showed concern for acute right arterial occlusion. 4/24>> transfer to [facility].  Significant studies: 4/21>> CT angio abdominal aorta with iliofemoral runoff: No opacification beyond mid SFA-suspicious for arterial embolism.  L1, L2, L3 and L4 vertebral bodies compression fractures.  Mildly enlarged left retroperitoneal, pelvic and inguinal lymph nodes. 4/21>> CT chest: No acute pulmonary disease. 4/21>> CT head: No acute intracranial abnormality 4/21>> CT C-spine: No acute fractures. 4/22>> chest x-ray: No active disease 4/22>> right lower extremity arterial duplex: No evidence of stenosis or occlusive disease. 4/22>> Echo: EF 20-25%, RV systolic function severely reduced  Antimicrobial therapy: Vancomycin: 4/21>>4/24 Zosyn: 4/21>> 4/24 Unasyn: 4/25>>4/28  Microbiology data: None  Procedures : 4/27>> left transmetatarsal amputation  Consults: Vascular surgery, PCCM, orthopedics, cardiology  DVT Prophylaxis : rivaroxaban (XARELTO) tablet 20 mg IV heparin  Subjective: Lying comfortably in bed-denies any chest pain or shortness of breath.  Inquiring when he will be discharged to SNF.  Assessment/Plan: Septic shock due to critical left limb ischemia with left forefoot ulceration/gangrene: Sepsis physiology resolved-briefly required pressors while at [facility]  health-evaluated by orthopedics-patient is s/p left transmetatarsal amputation on 4/27.  IV antibiotics will be discontinued on 4/28.  Continue supportive care-orthopedics following-okay to be touchdown weightbearing on his heel.  Acute metabolic encephalopathy: Due to above-found unresponsive at home-slowly improving.  Hyponatremia: Suspect due to CHF-continue fluid restriction-continue Lasix/Aldactone for a few more days-if no improvement-we will try Samsca.      Hyperkalemia: Resolved  Transaminitis: Concern for either hepatic congestion or amiodarone induced hepatitis.  Discussed with cardiology on 4/29-stop amiodarone.  LFTs now downtrending-continue to follow-await hepatitis serology.  Chronic atrial fibrillation with RVR: Rate controlled-initially required IV amiodarone infusion-subsequently maintained on oral amiodarone that was discontinued on 4/29 due to bump in LFTs.  Remains on Xarelto.   Per CTA report-suspicion that critical lower ext ischemia could have been from an embolic event-unclear how compliant patient has been with anticoagulation in the past (mother on 4/29 acknowledges noncompliance to medication).  Acute on chronic systolic/diastolic heart failure/new RV failure: Volume status is improved-cardiology following-defer initiation of CHF medications to cardiology service.  Due to bump in LFTs-concern for hepatic congestion in the setting of biventricular heart failure.  Renal function relatively stable-continue Lasix/Aldactone-BP too soft to add other therapies at this point.   History of CAD: No anginal symptoms-not on aspirin as patient on anticoagulation  DM-2 (A1c 7.7 on 4/25): Monitor closely with SSI.  Follow CBGs closely.  Resume metformin on discharge.  Recent Labs    02/23/21 2108 02/24/21 0904 02/24/21 1324  GLUCAP 170* 123* 173*   Prior history of polysubstance use: UDS 3 years back positive for amphetamine/cannabinoids.  ?  OSA: Sleep study outpatient.    Nutrition Problem: Nutrition Problem: Moderate Malnutrition Etiology: chronic illness (CHF)  Signs/Symptoms: mild muscle depletion,moderate muscle depletion,mild fat depletion,moderate fat depletion Interventions: MVI,Glucerna shake,Magic cup   Diet: Diet Order            Diet Carb Modified Fluid consistency: Thin; Room service appropriate? No; Fluid restriction: 1200 mL Fluid  Diet effective now                  Code Status: Full code   Family Communication: Called mother-[name]-[phone]-updated over the phone on 4/29.  Disposition Plan: Status is: Inpatient  Remains inpatient appropriate because:Inpatient level of care appropriate due to severity of illness   Dispo: The patient is from: Home              Anticipated d/c is to: tbd              Patient currently is not medically stable to d/c.   Difficult to place patient No   Barriers to Discharge: Left leg gangrene-s/p transmetatarsal amputation-hyponatremia-likely will need SNF.   Antimicrobial agents: Anti-infectives (From admission, onward)   Start     Dose/Rate Route Frequency Ordered Stop   02/20/21 1100  ceFAZolin (ANCEF) IVPB 2g/100 mL premix        2 g 200 mL/hr over 30 Minutes Intravenous To Short Stay 02/20/21 0754 02/20/21 1254   02/18/21 1745  Ampicillin-Sulbactam (UNASYN) 3 g in sodium chloride 0.9 % 100 mL IVPB        3 g 200 mL/hr over 30 Minutes Intravenous Every 6 hours 02/18/21 1645 02/21/21 2359   02/18/21 1200  metroNIDAZOLE (FLAGYL) IVPB 500 mg  Status:  Discontinued        500 mg 100 mL/hr over 60 Minutes Intravenous Every 8 hours 02/18/21 1033 02/18/21 1043   02/18/21 1130  cefTRIAXone (ROCEPHIN) 2 g in sodium chloride 0.9 % 100 mL IVPB  Status:  Discontinued        2 g 200 mL/hr over 30 Minutes Intravenous Every 24 hours 02/18/21 1033 02/18/21 1043   02/15/21 0230  vancomycin (VANCOREADY) IVPB 1250 mg/250 mL  Status:  Discontinued        1,250 mg 166.7 mL/hr  over 90 Minutes Intravenous Every 12 hours 02/15/21 0140 02/18/21 0735   02/15/21 0030  piperacillin-tazobactam (ZOSYN) IVPB 3.375 g  Status:  Discontinued        3.375 g 12.5 mL/hr over 240 Minutes Intravenous Every 8 hours 02/14/21 2338 02/18/21 1032       Time spent: 25 minutes-Greater than 50% of this time was spent in counseling, explanation of diagnosis, planning of further management, and coordination of care.  MEDICATIONS: Scheduled Meds: . vitamin C  1,000 mg Oral Daily  . Chlorhexidine Gluconate Cloth  6 each Topical Daily  . docusate sodium  100 mg Oral BID  . famotidine  20 mg Oral Daily  . feeding supplement (GLUCERNA SHAKE)  237 mL Oral BID BM  . furosemide  40 mg Oral Daily  . insulin aspart  0-15 Units Subcutaneous TID WC  . insulin aspart  0-5 Units Subcutaneous QHS  . mouth rinse  15 mL Mouth Rinse BID  . multivitamin with minerals  1 tablet Oral Daily  . pantoprazole  40 mg Oral Daily  . rivaroxaban  20 mg Oral Q supper  . spironolactone  12.5 mg Oral Daily  . zinc sulfate  220 mg Oral Daily   Continuous Infusions: . sodium chloride 10 mL/hr at 02/21/21 0810   PRN Meds:.acetaminophen **OR** acetaminophen, albuterol, alum & mag hydroxide-simeth, bisacodyl,  guaiFENesin-dextromethorphan, magnesium citrate, ondansetron **OR** ondansetron (ZOFRAN) IV, oxyCODONE-acetaminophen, phenol, polyethylene glycol, zolpidem   PHYSICAL EXAM: Vital signs: Vitals:   02/24/21 0401 02/24/21 0500 02/24/21 0834 02/24/21 1324  BP: 96/68  (!) 86/68 97/74  Pulse: 93  81 83  Resp: 18   20  Temp: 98 F (36.7 C)  98.3 F (36.8 C)   TempSrc: Axillary  Axillary   SpO2: 95%  95% 92%  Weight:  103.7 kg    Height:       Filed Weights   02/22/21 0249 02/23/21 0325 02/24/21 0500  Weight: 111.4 kg 111.4 kg 103.7 kg   Body mass index is 28.58 kg/m.   Gen Exam:Alert awake-not in any distress HEENT:atraumatic, normocephalic Chest: B/L clear to auscultation anteriorly CVS:S1S2  regular Abdomen:soft non tender, non distended Extremities:no edema-left TMA. Neurology: Non focal Skin: no rash  I have personally reviewed following labs and imaging studies  LABORATORY DATA: CBC: Recent Labs  Lab 02/19/21 0511 02/20/21 0457 02/21/21 0124 02/22/21 0252 02/23/21 0037  WBC 13.4* 14.2* 13.0* 9.5 11.2*  HGB 12.8* 12.4* 12.6* 11.9* 12.2*  HCT 37.8* 37.7* 37.7* 36.3* 36.2*  MCV 82.2 84.3 83.2 84.0 82.1  PLT 183 163 140* 130* 120*    Basic Metabolic Panel: Recent Labs  Lab 02/17/21 1604 02/18/21 0121 02/18/21 0940 02/19/21 0511 02/19/21 1629 02/20/21 0457 02/21/21 0124 02/22/21 0252 02/23/21 0037 02/24/21 0742  NA 123* 123*   < > 123*   < > 126* 127* 127* 127* 126*  K 3.2* 3.4*   < > 4.3   < > 4.1 5.3* 4.2 3.9 4.8  CL 89* 90*   < > 90*   < > 90* 95* 93* 93* 94*  CO2 23 20*   < > 20*   < > 21* 12* 22 23 22   GLUCOSE 157* 102*   < > 127*   < > 86 103* 108* 82 108*  BUN 19 17   < > 17   < > 17 20 24* 25* 24*  CREATININE 1.05 1.06   < > 1.19   < > 1.05 1.07 1.38* 1.25* 1.28*  CALCIUM 7.8* 7.9*   < > 8.0*   < > 8.3* 8.2* 8.5* 8.2* 8.1*  MG 1.5* 1.8  --  1.7  --  2.0 2.1  --   --   --   PHOS  --  2.5  --   --   --   --   --   --   --   --    < > = values in this interval not displayed.    GFR: Estimated Creatinine Clearance: 81 mL/min (A) (by C-G formula based on SCr of 1.28 mg/dL (H)).  Liver Function Tests: Recent Labs  Lab 02/22/21 0252 02/23/21 0037 02/24/21 0742  AST 231* 144* 94*  ALT 259* 222* 169*  ALKPHOS 85 101 96  BILITOT 2.5* 2.2* 2.0*  PROT 6.0* 6.3* 6.0*  ALBUMIN 2.4* 2.5* 2.3*   No results for input(s): LIPASE, AMYLASE in the last 168 hours. No results for input(s): AMMONIA in the last 168 hours.  Coagulation Profile: No results for input(s): INR, PROTIME in the last 168 hours.  Cardiac Enzymes: Recent Labs  Lab 02/18/21 0940  CKTOTAL 45*    BNP (last 3 results) No results for input(s): PROBNP in the last 8760  hours.  Lipid Profile: No results for input(s): CHOL, HDL, LDLCALC, TRIG, CHOLHDL, LDLDIRECT in the last 72 hours.  Thyroid Function Tests:  No results for input(s): TSH, T4TOTAL, FREET4, T3FREE, THYROIDAB in the last 72 hours.  Anemia Panel: No results for input(s): VITAMINB12, FOLATE, FERRITIN, TIBC, IRON, RETICCTPCT in the last 72 hours.  Urine analysis: No results found for: COLORURINE, APPEARANCEUR, LABSPEC, PHURINE, GLUCOSEU, HGBUR, BILIRUBINUR, KETONESUR, PROTEINUR, UROBILINOGEN, NITRITE, LEUKOCYTESUR  Sepsis Labs: Lactic Acid, Venous    Component Value Date/Time   LATICACIDVEN 1.6 02/15/2021 0250    MICROBIOLOGY: Recent Results (from the past 240 hour(s))  Surgical pcr screen     Status: Abnormal   Collection Time: 02/14/21  8:00 PM   Specimen: Nasal Mucosa; Nasal Swab  Result Value Ref Range Status   MRSA, PCR NEGATIVE NEGATIVE Final   Staphylococcus aureus POSITIVE (A) NEGATIVE Final    Comment: (NOTE) The Xpert SA Assay (FDA approved for NASAL specimens in patients 67 years of age and older), is one component of a comprehensive surveillance program. It is not intended to diagnose infection nor to guide or monitor treatment. Performed at [facility], [address], [place], [place] [address]   SARS Coronavirus 2 by RT PCR (hospital order, performed in [facility] hospital lab) Nasopharyngeal Nasopharyngeal Swab     Status: None   Collection Time: 02/20/21  8:03 AM   Specimen: Nasopharyngeal Swab  Result Value Ref Range Status   SARS Coronavirus 2 NEGATIVE NEGATIVE Final    Comment: (NOTE) SARS-CoV-2 target nucleic acids are NOT DETECTED.  The SARS-CoV-2 RNA is generally detectable in upper and lower respiratory specimens during the acute phase of infection. The lowest concentration of SARS-CoV-2 viral copies this assay can detect is 250 copies / mL. A negative result does not preclude SARS-CoV-2 infection and should not be used as the sole basis for  treatment or other patient management decisions.  A negative result may occur with improper specimen collection / handling, submission of specimen other than nasopharyngeal swab, presence of viral mutation(s) within the areas targeted by this assay, and inadequate number of viral copies (<250 copies / mL). A negative result must be combined with clinical observations, patient history, and epidemiological information.  Fact Sheet for Patients:   BoilerBrush.com.cy  Fact Sheet for Healthcare Providers: https://pope.com/  This test is not yet approved or  cleared by the [place] FDA and has been authorized for detection and/or diagnosis of SARS-CoV-2 by FDA under an Emergency Use Authorization (EUA).  This EUA will remain in effect (meaning this test can be used) for the duration of the COVID-19 declaration under Section 564(b)(1) of the Act, 21 U.S.C. section 360bbb-3(b)(1), unless the authorization is terminated or revoked sooner.  Performed at [facility], [address] [place], [place] [address]     RADIOLOGY STUDIES/RESULTS: No results found.   LOS: 10 days   Jeoffrey Massed, MD  [facility]    To contact the attending provider between 7A-7P or the covering provider during after hours 7P-7A, please log into the web site [email or web address] and access using universal [employer] password for that web site. If you do not have the password, please call the hospital operator.  02/24/2021, 2:00 PM

## 2021-02-24 NOTE — Progress Notes (Signed)
Progress Note  Patient Name: Fernando Maddox Date of Encounter: 02/24/2021  [facility] Cardiologist: Garwin Brothers, MD   Subjective   Still somewhat tired, mildly hypotensive this morning.  No chest pain.  Inpatient Medications    Scheduled Meds: . vitamin C  1,000 mg Oral Daily  . Chlorhexidine Gluconate Cloth  6 each Topical Daily  . docusate sodium  100 mg Oral BID  . famotidine  20 mg Oral Daily  . feeding supplement (GLUCERNA SHAKE)  237 mL Oral BID BM  . furosemide  40 mg Oral Daily  . insulin aspart  0-15 Units Subcutaneous TID WC  . insulin aspart  0-5 Units Subcutaneous QHS  . mouth rinse  15 mL Mouth Rinse BID  . multivitamin with minerals  1 tablet Oral Daily  . pantoprazole  40 mg Oral Daily  . rivaroxaban  20 mg Oral Q supper  . spironolactone  12.5 mg Oral Daily  . zinc sulfate  220 mg Oral Daily   Continuous Infusions: . sodium chloride 10 mL/hr at 02/21/21 0810   PRN Meds: acetaminophen **OR** acetaminophen, albuterol, alum & mag hydroxide-simeth, bisacodyl, guaiFENesin-dextromethorphan, magnesium citrate, ondansetron **OR** ondansetron (ZOFRAN) IV, oxyCODONE-acetaminophen, phenol, polyethylene glycol, zolpidem   Vital Signs    Vitals:   02/23/21 2358 02/24/21 0401 02/24/21 0500 02/24/21 0834  BP: 96/81 96/68  (!) 86/68  Pulse: 96 93  81  Resp: 20 18    Temp: 97.8 F (36.6 C) 98 F (36.7 C)  98.3 F (36.8 C)  TempSrc: Axillary Axillary  Axillary  SpO2: 99% 95%  95%  Weight:   103.7 kg   Height:        Intake/Output Summary (Last 24 hours) at 02/24/2021 1052 Last data filed at 02/24/2021 0600 Gross per 24 hour  Intake 480 ml  Output 350 ml  Net 130 ml   Last 3 Weights 02/24/2021 02/23/2021 02/22/2021  Weight (lbs) 228 lb 9.9 oz 245 lb 9.5 oz 245 lb 9.5 oz  Weight (kg) 103.7 kg 111.4 kg 111.4 kg     Physical Exam   GEN: No acute distress.   Neck: No JVD Cardiac: RRR, no murmurs, rubs, or gallops.  Respiratory: Clear to auscultation  bilaterally. GI: Soft, nontender, non-distended  MS: No edema; left-sided partial foot amputation. Neuro:  Nonfocal  Psych: Normal affect   Labs    High Sensitivity Troponin:  No results for input(s): TROPONINIHS in the last 720 hours.    Chemistry Recent Labs  Lab 02/22/21 0252 02/23/21 0037 02/24/21 0742  NA 127* 127* 126*  K 4.2 3.9 4.8  CL 93* 93* 94*  CO2 22 23 22   GLUCOSE 108* 82 108*  BUN 24* 25* 24*  CREATININE 1.38* 1.25* 1.28*  CALCIUM 8.5* 8.2* 8.1*  PROT 6.0* 6.3* 6.0*  ALBUMIN 2.4* 2.5* 2.3*  AST 231* 144* 94*  ALT 259* 222* 169*  ALKPHOS 85 101 96  BILITOT 2.5* 2.2* 2.0*  GFRNONAA 59* >60 >60  ANIONGAP 12 11 10      Hematology Recent Labs  Lab 02/21/21 0124 02/22/21 0252 02/23/21 0037  WBC 13.0* 9.5 11.2*  RBC 4.53 4.32 4.41  HGB 12.6* 11.9* 12.2*  HCT 37.7* 36.3* 36.2*  MCV 83.2 84.0 82.1  MCH 27.8 27.5 27.7  MCHC 33.4 32.8 33.7  RDW 17.1* 17.1* 17.7*  PLT 140* 130* 120*      Patient Profile     60 y.o. male with echocardiogram demonstrating EF of 20 to 25%, permanent  atrial fibrillation, elevated liver enzymes stroke, obstructive sleep apnea, tobacco use  Assessment & Plan    Permanent atrial fibrillation - Previously had trouble with rate control therefore amiodarone was utilized because of hypotension.  Amiodarone currently on hold secondary to elevated liver enzymes however elevated liver enzymes may be compatible with hepatic congestion in the setting of recent sepsis/heart failure/shock.  Liver functions are trending downward appropriately.  If atrial fibrillation rate becomes an issue, can always resume at lower dose especially LFTs continue to improve.  Hyponatremia - Systolic heart failure certainly contributing, agree with p.o. Lasix and spironolactone.  Blood pressure too soft to initiate beta-blocker or ACE inhibitor/ARB.   -Continue to restrict free water.  Gangrenous left lower extremity limb ischemia status post  transmetatarsal amputation. - Slowly improving.   For questions or updates, please contact [facility] Please consult [email or web address] for contact info under        Signed, Donato Schultz, MD  02/24/2021, 10:52 AM

## 2021-02-24 NOTE — Plan of Care (Signed)
VSS. C/o SOB, 95% room air, wants oxygen on all the time. Placed on 2L for comfort. Not OOB overnight. Drsg intact. Prevena in place. Call bell within reach. Bed alarm on.  Problem: Education: Goal: Knowledge of the prescribed therapeutic regimen will improve Outcome: Progressing Goal: Ability to verbalize activity precautions or restrictions will improve Outcome: Progressing Goal: Understanding of discharge needs will improve Outcome: Progressing   Problem: Activity: Goal: Ability to perform//tolerate increased activity and mobilize with assistive devices will improve Outcome: Progressing   Problem: Clinical Measurements: Goal: Postoperative complications will be avoided or minimized Outcome: Progressing   Problem: Self-Care: Goal: Ability to meet self-care needs will improve Outcome: Progressing   Problem: Self-Concept: Goal: Ability to maintain and perform role responsibilities to the fullest extent possible will improve Outcome: Progressing   Problem: Pain Management: Goal: Pain level will decrease with appropriate interventions Outcome: Progressing   Problem: Skin Integrity: Goal: Demonstration of wound healing without infection will improve Outcome: Progressing

## 2021-02-24 NOTE — Progress Notes (Addendum)
Progress Note  Patient Name: Fernando Maddox Date of Encounter: 02/24/2021  [facility] Cardiologist: Garwin Brothers, MD   Subjective   Pt states he is still having dyspnea, but groans to most questions.   Inpatient Medications    Scheduled Meds: . vitamin C  1,000 mg Oral Daily  . Chlorhexidine Gluconate Cloth  6 each Topical Daily  . docusate sodium  100 mg Oral BID  . famotidine  20 mg Oral Daily  . feeding supplement (GLUCERNA SHAKE)  237 mL Oral BID BM  . furosemide  40 mg Oral Daily  . insulin aspart  0-15 Units Subcutaneous TID WC  . insulin aspart  0-5 Units Subcutaneous QHS  . mouth rinse  15 mL Mouth Rinse BID  . multivitamin with minerals  1 tablet Oral Daily  . pantoprazole  40 mg Oral Daily  . rivaroxaban  20 mg Oral Q supper  . spironolactone  12.5 mg Oral Daily  . zinc sulfate  220 mg Oral Daily   Continuous Infusions: . sodium chloride 10 mL/hr at 02/21/21 0810   PRN Meds: acetaminophen **OR** acetaminophen, albuterol, alum & mag hydroxide-simeth, bisacodyl, guaiFENesin-dextromethorphan, magnesium citrate, ondansetron **OR** ondansetron (ZOFRAN) IV, oxyCODONE-acetaminophen, phenol, polyethylene glycol, zolpidem   Vital Signs    Vitals:   02/24/21 0500 02/24/21 0834 02/24/21 1324 02/24/21 1757  BP:  (!) 86/68 97/74 101/89  Pulse:  81 83   Resp:   20   Temp:  98.3 F (36.8 C)    TempSrc:  Axillary    SpO2:  95% 92% 95%  Weight: 103.7 kg     Height:        Intake/Output Summary (Last 24 hours) at 02/24/2021 1806 Last data filed at 02/24/2021 0600 Gross per 24 hour  Intake 480 ml  Output 350 ml  Net 130 ml   Last 3 Weights 02/24/2021 02/23/2021 02/22/2021  Weight (lbs) 228 lb 9.9 oz 245 lb 9.5 oz 245 lb 9.5 oz  Weight (kg) 103.7 kg 111.4 kg 111.4 kg      Telemetry    Afib RVR in the 120-130s - Personally Reviewed  ECG    No new tracings - Personally Reviewed  Physical Exam   GEN: No acute distress.   Neck: No JVD Cardiac: irregular  rhythm, tachycardic rate Respiratory: crackles lower lung fields on anterior lung exam GI: Soft, nontender, non-distended  MS: B LE edema Neuro:  Nonfocal  Psych: Normal affect   Labs    High Sensitivity Troponin:  No results for input(s): TROPONINIHS in the last 720 hours.    Chemistry Recent Labs  Lab 02/22/21 0252 02/23/21 0037 02/24/21 0742  NA 127* 127* 126*  K 4.2 3.9 4.8  CL 93* 93* 94*  CO2 22 23 22   GLUCOSE 108* 82 108*  BUN 24* 25* 24*  CREATININE 1.38* 1.25* 1.28*  CALCIUM 8.5* 8.2* 8.1*  PROT 6.0* 6.3* 6.0*  ALBUMIN 2.4* 2.5* 2.3*  AST 231* 144* 94*  ALT 259* 222* 169*  ALKPHOS 85 101 96  BILITOT 2.5* 2.2* 2.0*  GFRNONAA 59* >60 >60  ANIONGAP 12 11 10      Hematology Recent Labs  Lab 02/21/21 0124 02/22/21 0252 02/23/21 0037  WBC 13.0* 9.5 11.2*  RBC 4.53 4.32 4.41  HGB 12.6* 11.9* 12.2*  HCT 37.7* 36.3* 36.2*  MCV 83.2 84.0 82.1  MCH 27.8 27.5 27.7  MCHC 33.4 32.8 33.7  RDW 17.1* 17.1* 17.7*  PLT 140* 130* 120*    BNPNo  results for input(s): BNP, PROBNP in the last 168 hours.   DDimer No results for input(s): DDIMER in the last 168 hours.   Radiology    No results found.  Cardiac Studies   Echo 02/15/21: 1. Left ventricular ejection fraction, by estimation, is 20 to 25%. The  left ventricle has severely decreased function. The left ventricle  demonstrates global hypokinesis. The left ventricular internal cavity size  was moderately dilated. Left  ventricular diastolic function could not be evaluated. There is the  interventricular septum is flattened in systole and diastole, consistent  with right ventricular pressure and volume overload.  2. Right ventricular systolic function is severely reduced. The right  ventricular size is moderately enlarged.  3. Left atrial size was moderately dilated.  4. Right atrial size was moderately dilated.  5. The mitral valve is normal in structure. Mild to moderate mitral valve  regurgitation.   6. Tricuspid valve regurgitation is mild to moderate.  7. The aortic valve is tricuspid. There is mild calcification of the  aortic valve. Aortic valve regurgitation is not visualized.  8. Aortic dilatation noted. There is borderline dilatation of the  ascending aorta, measuring 40 mm.  9. The inferior vena cava is dilated in size with <50% respiratory  variability, suggesting right atrial pressure of 15 mmHg.   Patient Profile     60 y.o. male CAD, permanent Afib, HFrEF, HTN, HLD, DMII, CVA, PAD with nonhealing foot wounds, OSA on CPAP, and tobacco abuse who initially presented to [place] after being found down for 2 days. Was found to have LLE gangrene with concern for right arterial occlusion prompting transfer to [facility]. Formal vascular studies with normal ABI and negative LE dopplers for DVT. He was recommended for left transmetatarsal amputation. Cardiology was consulted on this admission for HFrEF and chronic Afib. Echocardiogram 4/22 showed EF of 20 to 25% with global hypokinesis, RV severely reduced, with moderate enlargement, LA/RA moderately dilated, mild to moderate tricuspid regurgitation.  No LV thrombus noted.  Assessment & Plan    #Permanent Atrial Fibrillation: CHADs-vasc 6. Rate control difficult this admission due to hypotension. Amiodarone currently on hold due to elevated LFTs which are now improving. -On xarelto -Rate control held due to hypotension - generally tolerating rate well  #Acute on chronic combined systolic and diastolic heart failure TTE with LVEF 20-25% with global hypokinesis. GDMT limited due to hypotension. -primary holding spironolactone 12.5 mg (was on 50 mg at home) due to AKI today -Continue lasix 40mg  daily -GDMT limited due to hypotension; will add as able -Low Na diet -Daily weights as above, I&Os incomplete  #RV failure: #Transaminitis: -Diuresis as above -Transaminitis improving  #Septic shock: #LLE gangrene s/p left transmetatarsal  amputation: #PAD: Slowly improving. S/p transmetatarsal amputation on 02/20/21 for LLE gangrene. -Management per ortho and primary  #HLD: -Holding statin due to transaminitis  #DMII: -Management per primary  #CVA: -Not on ASA due to need for [facility] -Resume statin once LFT improve  #Tobacco Abuse: -Encourage cessation   #AKI - sCr 1.67 (1.28) - primary held spironolactone - BMP tomorrow    For questions or updates, please contact [facility] Please consult [email or web address] for contact info under     Signed [facility], PA  Patient seen and examined and agree with Micah Flesher, PA as detailed above.  In brief, the patient is a 60 year old male with history of CAD, permanent Afib, HFrEF, HTN, HLD, DMII, CVA, PAD with nonhealing foot wounds, OSA on CPAP, and tobacco  abuse who initially presented to [place] after being found down for 2 days. Was found to have LLE gangrene with concern for possible arterial occlusion prompting transfer to [facility]. Formal vascular studies with normal ABI and negative LE dopplers for DVT. He was recommended for left transmetatarsal amputation. Cardiology was consulted on this admission for HFrEF and chronic Afib. Echocardiogram 4/22 showed EF of 20 to 25% with global hypokinesis, RV severely reduced, with moderate enlargement, LA/RA moderately dilated, mild to moderate tricuspid regurgitation.  No LV thrombus noted.  Today, the patient is sleeping on my evaluation. Continues to have SOB but improved from prior. Asking for more to drink. Intermittently groaning on exam.   Notably, Cr bumped to 1.67 but remains overloaded on exam. Spironolactone held currently.  GEN: No acute distress. Laying in bed; intermittently groaning   Neck: JVD to mid-neck Cardiac: Irregularly irregular Respiratory: Crackles mainly on left; expiratory wheezing GI: Soft, nontender, non-distended  MS: 2+ pitting edema Neuro:  Groaning on exam; intermittently answering  questions Psych: Disoriented  Plan: -Cr rising overnight but remains clinically overloaded on exam -Continue lasix 40mg  IV daily for now -Holding spironolactone -GDMT limited due to hypotension; will add as able although has long history of non-compliance and therefore not a great candidate for advanced therapies or entresto going forward -Continue xarelto -Management of left LE gangrene TMA per primary and ortho -Continue fluid restriction -Awaiting SNF  , MD

## 2021-02-24 NOTE — TOC Progression Note (Signed)
Transition of Care [facility]) - Progression Note    Patient Details  Name: Fernando Maddox MRN: [id number] Date of Birth: [birth year]  Transition of Care [facility]) CM/SW Contact  Carmina Miller, LCSWA Phone Number: 02/24/2021, 1:15 PM  Clinical Narrative:    CSW reached out to pt's mom via cell phone, no vm set up, also called home phone, had to leave vm. Wanted to discuss pt's SNF offer.    Expected Discharge Plan: Skilled Nursing Facility Barriers to Discharge: Continued Medical Work up  Expected Discharge Plan and Services Expected Discharge Plan: Skilled Nursing Facility   Discharge Planning Services: CM Consult   Living arrangements for the past 2 months: Single Family Home                                       Social Determinants of Health (SDOH) Interventions    Readmission Risk Interventions No flowsheet data found.

## 2021-02-25 DIAGNOSIS — L97509 Non-pressure chronic ulcer of other part of unspecified foot with unspecified severity: Secondary | ICD-10-CM

## 2021-02-25 DIAGNOSIS — L97529 Non-pressure chronic ulcer of other part of left foot with unspecified severity: Secondary | ICD-10-CM | POA: Diagnosis not present

## 2021-02-25 DIAGNOSIS — I5041 Acute combined systolic (congestive) and diastolic (congestive) heart failure: Secondary | ICD-10-CM

## 2021-02-25 DIAGNOSIS — E871 Hypo-osmolality and hyponatremia: Secondary | ICD-10-CM | POA: Diagnosis not present

## 2021-02-25 LAB — HEPATITIS PANEL, ACUTE
HCV Ab: NONREACTIVE
Hep A IgM: NONREACTIVE
Hep B C IgM: NONREACTIVE
Hepatitis B Surface Ag: NONREACTIVE

## 2021-02-25 LAB — COMPREHENSIVE METABOLIC PANEL
ALT: 147 U/L — ABNORMAL HIGH (ref 0–44)
AST: 71 U/L — ABNORMAL HIGH (ref 15–41)
Albumin: 2.3 g/dL — ABNORMAL LOW (ref 3.5–5.0)
Alkaline Phosphatase: 112 U/L (ref 38–126)
Anion gap: 10 (ref 5–15)
BUN: 29 mg/dL — ABNORMAL HIGH (ref 6–20)
CO2: 23 mmol/L (ref 22–32)
Calcium: 8.2 mg/dL — ABNORMAL LOW (ref 8.9–10.3)
Chloride: 95 mmol/L — ABNORMAL LOW (ref 98–111)
Creatinine, Ser: 1.67 mg/dL — ABNORMAL HIGH (ref 0.61–1.24)
GFR, Estimated: 47 mL/min — ABNORMAL LOW (ref >60)
Glucose, Bld: 115 mg/dL — ABNORMAL HIGH (ref 70–99)
Potassium: 4.9 mmol/L (ref 3.5–5.1)
Sodium: 128 mmol/L — ABNORMAL LOW (ref 135–145)
Total Bilirubin: 1.9 mg/dL — ABNORMAL HIGH (ref 0.3–1.2)
Total Protein: 6.3 g/dL — ABNORMAL LOW (ref 6.5–8.1)

## 2021-02-25 LAB — GLUCOSE, CAPILLARY
Glucose-Capillary: 129 mg/dL — ABNORMAL HIGH (ref 70–99)
Glucose-Capillary: 225 mg/dL — ABNORMAL HIGH (ref 70–99)
Glucose-Capillary: 169 mg/dL — ABNORMAL HIGH (ref 70–99)
Glucose-Capillary: 165 mg/dL — ABNORMAL HIGH (ref 70–99)

## 2021-02-25 LAB — SARS CORONAVIRUS 2 (TAT 6-24 HRS): SARS Coronavirus 2: NEGATIVE

## 2021-02-25 MED ORDER — SPIRONOLACTONE 12.5 MG HALF TABLET: 12.5000 mg | ORAL_TABLET | Freq: Every day | ORAL | Status: DC

## 2021-02-25 NOTE — TOC Progression Note (Addendum)
Transition of Care [facility]) - Progression Note    Patient Details  Name: BRODY BONNEAU MRN: [id number] Date of Birth: [birth year]  Transition of Care [facility]) CM/SW Contact  Mearl Latin, LCSW Phone Number: 02/25/2021, 11:36 AM  Clinical Narrative:    CSW spoke with patient's mother to provide two SNF bed offers. She and her husband have selected [facility]. CSW awaiting response from [facility], Rosey Bath, to see if they still have bed available for tomorrow. Patient will need updated COVID test.   UPDATE: [facility] able to accept patient tomorrow. Patient aware and in agreement with plan.   Expected Discharge Plan: Skilled Nursing Facility Barriers to Discharge: Continued Medical Work up  Expected Discharge Plan and Services Expected Discharge Plan: Skilled Nursing Facility   Discharge Planning Services: CM Consult   Living arrangements for the past 2 months: Single Family Home                                       Social Determinants of Health (SDOH) Interventions    Readmission Risk Interventions No flowsheet data found.

## 2021-02-25 NOTE — Progress Notes (Signed)
The Pt. mother-Fernando Maddox accepted the chaplain's F/U spiritual care phone call. The chaplain checked in with the Pt. RN-Kathy before the call.  Fernando Maddox updated the chaplain the Pt. will be discharged tomorrow to a SNF two blocks from the hospital. The chaplain understands the Pt. family hoped the Pt. would have been a little closer to home.  Fernando Maddox also expressed her curiousity about how much the Pt. knows about his health. Fernando Maddox added "the MD was very frank with me."  The chaplain offered further communication or updates from the Pt. RN or Palliative Care.  The chaplain paused between the reflective listening allowing Fernando Maddox to share the benefits from the connections and support of her faith community. Fernando Maddox adds a strong relationship with her sister to the peace she feels from knowing people are praying for her and the Pt.  This chaplain is available for F/U spiritual care as needed.

## 2021-02-25 NOTE — NC FL2 (Signed)
[place] MEDICAID FL2 LEVEL OF CARE SCREENING TOOL     IDENTIFICATION  Patient Name: Fernando Maddox Birthdate: 1961-05-12 Sex: male Admission Date (Current Location): 02/14/2021  [facility] and [place] Number:  Producer, television/film/video and Address:  [facility] [place], [address], [place], [place] [address]      Provider Number: [id number]  Attending Physician Name and Address:  Maretta Bees, MD  Relative Name and Phone Number:  Garon, Melander Rama Mother [phone]    Current Level of Care: Hospital Recommended Level of Care: Skilled Nursing Facility Prior Approval Number: [id number]  Date Approved/Denied:   PASRR Number: [id number]  Discharge Plan: SNF    Current Diagnoses: Patient Active Problem List   Diagnosis Date Noted  . Foot ulcer (HCC)   . Acute combined systolic and diastolic congestive heart failure (HCC)   . Hyponatremia   . Ulcer of left foot with necrosis of bone (HCC)   . Septic shock (HCC) 02/14/2021  . Atrial fibrillation (HCC)   . Cerebrovascular disease   . Chronic pain syndrome   . Hyperlipidemia   . Shoulder pain   . Anemia   . GERD (gastroesophageal reflux disease)   . Hypercholesteremia   . Hypogonadism male   . Rotator cuff syndrome   . Sleep apnea   . Chronic anticoagulation 03/25/2018  . Chronic systolic congestive heart failure (HCC) 03/25/2018  . Osteomyelitis, unspecified (HCC) 10/06/2017  . Diabetic ulcer of toe of right foot associated with type 2 diabetes mellitus (HCC) 10/01/2017  . Cellulitis 09/29/2017  . History of CVA (cerebrovascular accident) 09/29/2017  . Polysubstance abuse (HCC) 09/29/2017  . 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 Community acquired pneumonia 08/11/2016  . Tobacco use 08/11/2016  . Acute combined systolic and diastolic CHF, NYHA class 1 (HCC) 08/07/2016  . 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 History of drug abuse (HCC)  08/05/2016  . Osteoarthritis 08/05/2016  . PAF (paroxysmal atrial fibrillation) (HCC) 08/05/2016  . Stroke (HCC) 08/05/2016  . Diabetes mellitus, type 2 (HCC) 02/11/2010  . Hyperlipemia 02/11/2010  . HYPERTENSION, UNSPECIFIED 02/11/2010  . CAD (coronary artery disease) 02/11/2010  . Hypertension 02/11/2010  . ESOPHAGEAL STRICTURE 02/11/2010  . BACK PAIN 02/11/2010  . HEADACHE 02/11/2010  . CHEST PAIN 02/11/2010  . DEPRESSION/ANXIETY 02/11/2010    Orientation RESPIRATION BLADDER Height & Weight     Self,Time,Situation,Place  Normal Continent,External catheter Weight: 225 lb 15.5 oz (102.5 kg) Height:  6\' 3"  (190.5 cm)  BEHAVIORAL SYMPTOMS/MOOD NEUROLOGICAL BOWEL NUTRITION STATUS      Continent Diet (see d/c summary)  AMBULATORY STATUS COMMUNICATION OF NEEDS Skin   Extensive Assist Verbally PU Stage and Appropriate Care,Surgical wounds,Wound Vac (Incision left foot; blister top of left foot; Deep tissue pressure injury on medial sacrum; prevena travel wound vac for 7 days)                       Personal Care Assistance Level of Assistance  Bathing,Dressing,Feeding Bathing Assistance: Limited assistance Feeding assistance: Independent Dressing Assistance: Limited assistance     Functional Limitations Info  Sight,Hearing,Speech Sight Info: Adequate Hearing Info: Adequate Speech Info: Adequate    SPECIAL CARE FACTORS FREQUENCY  OT (By licensed OT),PT (By licensed PT)     PT Frequency: 5x/week OT Frequency: 5x/week            Contractures Contractures Info: Not present    Additional Factors Info  Code Status,Allergies  Code Status Info: Full code Allergies Info: Hydrocodone-acetaminophen, bee venom, codein,Cephalexin, Vicodin (hydrocodone-acetaminophen), Zocor (simvastatin), sun sensitive           Current Medications (02/25/2021):  This is the current hospital active medication list Current Facility-Administered Medications  Medication Dose Route Frequency  Provider Last Rate Last Admin  . 0.9 %  sodium chloride infusion   Intravenous Continuous Maretta Bees, MD 10 mL/hr at 02/21/21 0810 Rate Change at 02/21/21 0810  . acetaminophen (TYLENOL) tablet 650 mg  650 mg Oral Q6H PRN Persons, [place], [place]   650 mg at 02/19/21 2040   Or  . acetaminophen (TYLENOL) suppository 650 mg  650 mg Rectal Q6H PRN Persons, [place], [place]      . albuterol (PROVENTIL) (2.5 MG/3ML) 0.083% nebulizer solution 3 mL  3 mL Inhalation Q6H PRN Persons, [place], [place]      . alum & mag hydroxide-simeth (MAALOX/MYLANTA) 200-200-20 MG/5ML suspension 15-30 mL  15-30 mL Oral Q2H PRN Persons, [place], [place]      . ascorbic acid (VITAMIN C) tablet 1,000 mg  1,000 mg Oral Daily Persons, [place], [place]   1,000 mg at 02/25/21 3846  . bisacodyl (DULCOLAX) EC tablet 5 mg  5 mg Oral Daily PRN Persons, [place], [place]      . Chlorhexidine Gluconate Cloth 2 % PADS 6 each  6 each Topical Daily Persons, [place], [place]   6 each at 02/25/21 [phone]  . docusate sodium (COLACE) capsule 100 mg  100 mg Oral BID Persons, [place], [place]   100 mg at 02/24/21 0906  . famotidine (PEPCID) tablet 20 mg  20 mg Oral Daily Persons, [place], [place]   20 mg at 02/25/21 [phone]  . feeding supplement (GLUCERNA SHAKE) (GLUCERNA SHAKE) liquid 237 mL  237 mL Oral BID BM Persons, [place], [place]   237 mL at 02/25/21 0839  . furosemide (LASIX) tablet 40 mg  40 mg Oral Daily Maretta Bees, MD   40 mg at 02/25/21 1779  . guaiFENesin-dextromethorphan (ROBITUSSIN DM) 100-10 MG/5ML syrup 15 mL  15 mL Oral Q4H PRN Persons, [place], PA      . insulin aspart (novoLOG) injection 0-15 Units  0-15 Units Subcutaneous TID WC Persons, [place], [place]   5 Units at 02/25/21 1208  . insulin aspart (novoLOG) injection 0-5 Units  0-5 Units Subcutaneous QHS Persons, [place], [place]   4 Units at 02/22/21 2011  . magnesium citrate solution 1 Bottle  1 Bottle Oral Once PRN Persons, [place], [place]      . MEDLINE mouth rinse  15 mL Mouth Rinse BID  Persons, [place], PA   15 mL at 02/25/21 0839  . multivitamin with minerals tablet 1 tablet  1 tablet Oral Daily Persons, [place], [place]   1 tablet at 02/25/21 [phone]  . ondansetron (ZOFRAN) tablet 4 mg  4 mg Oral Q6H PRN Persons, [place], PA       Or  . ondansetron [facility]) injection 4 mg  4 mg Intravenous Q6H PRN Persons, [place], PA   4 mg at 02/23/21 0615  . oxyCODONE-acetaminophen (PERCOCET/ROXICET) 5-325 MG per tablet 1 tablet  1 tablet Oral Q6H PRN Maretta Bees, MD   1 tablet at 02/23/21 0428  . pantoprazole (PROTONIX) EC tablet 40 mg  40 mg Oral Daily Persons, [place], [place]   40 mg at 02/25/21 [phone]  . phenol (CHLORASEPTIC) mouth spray 1 spray  1 spray  Mouth/Throat PRN Persons, [place], [place]      . polyethylene glycol (MIRALAX / GLYCOLAX) packet 17 g  17 g Oral Daily PRN Persons, [place], [place]      . rivaroxaban (XARELTO) tablet 20 mg  20 mg Oral Q supper Maretta Bees, MD   20 mg at 02/24/21 1622  . [START ON 02/26/2021] spironolactone (ALDACTONE) tablet 12.5 mg  12.5 mg Oral Daily Ghimire, Shanker M, MD      . zinc sulfate capsule 220 mg  220 mg Oral Daily Persons, [place], [place]   220 mg at 02/25/21 5329  . zolpidem (AMBIEN) tablet 5 mg  5 mg Oral QHS PRN Persons, [place], PA   5 mg at 02/25/21 0215     Discharge Medications: Please see discharge summary for a list of discharge medications.  Relevant Imaging Results:  Relevant Lab Results:   Additional Information SSN [id number] Vaccinated x2  Mearl Latin, [place]

## 2021-02-25 NOTE — Progress Notes (Addendum)
PROGRESS NOTE        PATIENT DETAILS Name: Fernando Maddox Age: 60 y.o. Sex: male Date of Birth: 02/01/1961 Admit Date: 02/14/2021 Admitting Physician Charlott Holler, MD PCP:Van Domenic Schwab, MD  Brief Narrative: Patient is a 60 y.o. male with history of HFrEF, permanent atrial fibrillation, CAD, DM-2, known PAD-s/p partial amputation of right great toe presented to [facility] as a transfer from [facility] ED for septic shock in the setting of critical right lower extremity ischemia.  Significant events: 4/21>> transferred to [facility] from [facility]- found down-gangrene is right lower extremity-CT angio performed which showed concern for acute right arterial occlusion. 4/24>> transfer to [facility].  Significant studies: 4/21>> CT angio abdominal aorta with iliofemoral runoff: No opacification beyond mid SFA-suspicious for arterial embolism.  L1, L2, L3 and L4 vertebral bodies compression fractures.  Mildly enlarged left retroperitoneal, pelvic and inguinal lymph nodes. 4/21>> CT chest: No acute pulmonary disease. 4/21>> CT head: No acute intracranial abnormality 4/21>> CT C-spine: No acute fractures. 4/22>> chest x-ray: No active disease 4/22>> right lower extremity arterial duplex: No evidence of stenosis or occlusive disease. 4/22>> Echo: EF 20-25%, RV systolic function severely reduced  Antimicrobial therapy: Vancomycin: 4/21>>4/24 Zosyn: 4/21>> 4/24 Unasyn: 4/25>>4/28  Microbiology data: None  Procedures : 4/27>> left transmetatarsal amputation  Consults: Vascular surgery, PCCM, orthopedics, cardiology  DVT Prophylaxis : rivaroxaban (XARELTO) tablet 20 mg IV heparin  Subjective: Resting comfortably in bed. No complaints this morning.  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 -PT/OT  Acute on chronic combined heart failure (EF 20-25%), BiV heart failure Weights continuing to  trend down.  History of CAD Permanent atrial fibrillation, RVR resolved. Rate controlled at this time. Management per cardiology - pressures precluding GDMT - continue lasix, spiro - AC with xarelto - continue with fluid restriction  Acute metabolic encephalopathy: mentation slowly improving. Unclear baseline.  Hyponatremia: Suspect due to CHF-continue fluid restriction-continue Lasix/Aldactone for a few more days-if no improvement-we will try Samsca.      Transaminitis: amiodarone induced vs hepatic congestion. Now off amio. LFTs continuing to trend down. Will continue to monitor.  DM-2 (A1c 7.7 on 4/25): CBGs at goal. Monitor closely with SSI.  Prior history of polysubstance use: UDS 3 years back positive for amphetamine/cannabinoids.  ?  OSA: Sleep study outpatient.   Septic shock 2/2 to gangrenous LLE resolved  Nutrition Problem: Nutrition Problem: Moderate Malnutrition Etiology: chronic illness (CHF) Signs/Symptoms: mild muscle depletion,moderate muscle depletion,mild fat depletion,moderate fat depletion Interventions: MVI,Glucerna shake,Magic cup   Diet: Diet Order            Diet Carb Modified Fluid consistency: Thin; Room service appropriate? No; Fluid restriction: 1200 mL Fluid  Diet effective now                  Code Status: Full code   Family Communication: Called mother-[name]-[phone]-updated over the phone on 4/29.  Disposition Plan: Status is: Inpatient  Remains inpatient appropriate because:Inpatient level of care appropriate due to severity of illness   Dispo: The patient is from: Home              Anticipated d/c is to: tbd              Patient currently is not medically stable to d/c.   Difficult  to place patient No   Barriers to Discharge: Left leg gangrene-s/p transmetatarsal amputation-hyponatremia-likely will need SNF.   Antimicrobial agents: Anti-infectives (From admission, onward)   Start     Dose/Rate Route  Frequency Ordered Stop   02/20/21 1100  ceFAZolin (ANCEF) IVPB 2g/100 mL premix        2 g 200 mL/hr over 30 Minutes Intravenous To Short Stay 02/20/21 0754 02/20/21 1254   02/18/21 1745  Ampicillin-Sulbactam (UNASYN) 3 g in sodium chloride 0.9 % 100 mL IVPB        3 g 200 mL/hr over 30 Minutes Intravenous Every 6 hours 02/18/21 1645 02/21/21 2359   02/18/21 1200  metroNIDAZOLE (FLAGYL) IVPB 500 mg  Status:  Discontinued        500 mg 100 mL/hr over 60 Minutes Intravenous Every 8 hours 02/18/21 1033 02/18/21 1043   02/18/21 1130  cefTRIAXone (ROCEPHIN) 2 g in sodium chloride 0.9 % 100 mL IVPB  Status:  Discontinued        2 g 200 mL/hr over 30 Minutes Intravenous Every 24 hours 02/18/21 1033 02/18/21 1043   02/15/21 0230  vancomycin (VANCOREADY) IVPB 1250 mg/250 mL  Status:  Discontinued        1,250 mg 166.7 mL/hr over 90 Minutes Intravenous Every 12 hours 02/15/21 0140 02/18/21 0735   02/15/21 0030  piperacillin-tazobactam (ZOSYN) IVPB 3.375 g  Status:  Discontinued        3.375 g 12.5 mL/hr over 240 Minutes Intravenous Every 8 hours 02/14/21 2338 02/18/21 1032      MEDICATIONS: Scheduled Meds: . vitamin C  1,000 mg Oral Daily  . Chlorhexidine Gluconate Cloth  6 each Topical Daily  . docusate sodium  100 mg Oral BID  . famotidine  20 mg Oral Daily  . feeding supplement (GLUCERNA SHAKE)  237 mL Oral BID BM  . furosemide  40 mg Oral Daily  . insulin aspart  0-15 Units Subcutaneous TID WC  . insulin aspart  0-5 Units Subcutaneous QHS  . mouth rinse  15 mL Mouth Rinse BID  . multivitamin with minerals  1 tablet Oral Daily  . pantoprazole  40 mg Oral Daily  . rivaroxaban  20 mg Oral Q supper  . [START ON 02/26/2021] spironolactone  12.5 mg Oral Daily  . zinc sulfate  220 mg Oral Daily   Continuous Infusions: . sodium chloride 10 mL/hr at 02/21/21 0810   PRN Meds:.acetaminophen **OR** acetaminophen, albuterol, alum & mag hydroxide-simeth, bisacodyl, guaiFENesin-dextromethorphan,  magnesium citrate, ondansetron **OR** ondansetron (ZOFRAN) IV, oxyCODONE-acetaminophen, phenol, polyethylene glycol, zolpidem   PHYSICAL EXAM: Vital signs: Vitals:   02/24/21 2341 02/25/21 0041 02/25/21 0405 02/25/21 0639  BP: 98/81  124/89   Pulse: 97 81 91   Resp: 20 20 18    Temp: 97.6 F (36.4 C)  98 F (36.7 C)   TempSrc: Oral  Oral   SpO2: 92% 96% 98%   Weight:    102.5 kg  Height:       Filed Weights   02/23/21 0325 02/24/21 0500 02/25/21 0639  Weight: 111.4 kg 103.7 kg 102.5 kg   Body mass index is 28.24 kg/m.   Gen: chronically ill appearing, in NAD Cardiac: RRR, +1 pitting LE edema Pulm: breathing comfortably on 3L. Lungs clear  I have personally reviewed following labs and imaging studies  LABORATORY DATA: CBC: Recent Labs  Lab 02/19/21 0511 02/20/21 0457 02/21/21 0124 02/22/21 0252 02/23/21 0037  WBC 13.4* 14.2* 13.0* 9.5 11.2*  HGB 12.8* 12.4* 12.6*  11.9* 12.2*  HCT 37.8* 37.7* 37.7* 36.3* 36.2*  MCV 82.2 84.3 83.2 84.0 82.1  PLT 183 163 140* 130* 120*    Basic Metabolic Panel: Recent Labs  Lab 02/19/21 0511 02/19/21 1629 02/20/21 0457 02/21/21 0124 02/22/21 0252 02/23/21 0037 02/24/21 0742 02/25/21 0017  NA 123*   < > 126* 127* 127* 127* 126* 128*  K 4.3   < > 4.1 5.3* 4.2 3.9 4.8 4.9  CL 90*   < > 90* 95* 93* 93* 94* 95*  CO2 20*   < > 21* 12* 22 23 22 23   GLUCOSE 127*   < > 86 103* 108* 82 108* 115*  BUN 17   < > 17 20 24* 25* 24* 29*  CREATININE 1.19   < > 1.05 1.07 1.38* 1.25* 1.28* 1.67*  CALCIUM 8.0*   < > 8.3* 8.2* 8.5* 8.2* 8.1* 8.2*  MG 1.7  --  2.0 2.1  --   --   --   --    < > = values in this interval not displayed.    GFR: Estimated Creatinine Clearance: 61.8 mL/min (A) (by C-G formula based on SCr of 1.67 mg/dL (H)).  Liver Function Tests: Recent Labs  Lab 02/22/21 0252 02/23/21 0037 02/24/21 0742 02/25/21 0017  AST 231* 144* 94* 71*  ALT 259* 222* 169* 147*  ALKPHOS 85 101 96 112  BILITOT 2.5* 2.2* 2.0*  1.9*  PROT 6.0* 6.3* 6.0* 6.3*  ALBUMIN 2.4* 2.5* 2.3* 2.3*   No results for input(s): LIPASE, AMYLASE in the last 168 hours. No results for input(s): AMMONIA in the last 168 hours.  Coagulation Profile: No results for input(s): INR, PROTIME in the last 168 hours.  Cardiac Enzymes: Recent Labs  Lab 02/18/21 0940  CKTOTAL 45*    BNP (last 3 results) No results for input(s): PROBNP in the last 8760 hours.  Lipid Profile: No results for input(s): CHOL, HDL, LDLCALC, TRIG, CHOLHDL, LDLDIRECT in the last 72 hours.  Thyroid Function Tests: No results for input(s): TSH, T4TOTAL, FREET4, T3FREE, THYROIDAB in the last 72 hours.  Anemia Panel: No results for input(s): VITAMINB12, FOLATE, FERRITIN, TIBC, IRON, RETICCTPCT in the last 72 hours.  Urine analysis: No results found for: COLORURINE, APPEARANCEUR, LABSPEC, PHURINE, GLUCOSEU, HGBUR, BILIRUBINUR, KETONESUR, PROTEINUR, UROBILINOGEN, NITRITE, LEUKOCYTESUR  Sepsis Labs: Lactic Acid, Venous    Component Value Date/Time   LATICACIDVEN 1.6 02/15/2021 0250    MICROBIOLOGY: Recent Results (from the past 240 hour(s))  SARS Coronavirus 2 by RT PCR (hospital order, performed in [facility] hospital lab) Nasopharyngeal Nasopharyngeal Swab     Status: None   Collection Time: 02/20/21  8:03 AM   Specimen: Nasopharyngeal Swab  Result Value Ref Range Status   SARS Coronavirus 2 NEGATIVE NEGATIVE Final    Comment: (NOTE) SARS-CoV-2 target nucleic acids are NOT DETECTED.  The SARS-CoV-2 RNA is generally detectable in upper and lower respiratory specimens during the acute phase of infection. The lowest concentration of SARS-CoV-2 viral copies this assay can detect is 250 copies / mL. A negative result does not preclude SARS-CoV-2 infection and should not be used as the sole basis for treatment or other patient management decisions.  A negative result may occur with improper specimen collection / handling, submission of specimen  other than nasopharyngeal swab, presence of viral mutation(s) within the areas targeted by this assay, and inadequate number of viral copies (<250 copies / mL). A negative result must be combined with clinical observations, patient history, and  epidemiological information.  Fact Sheet for Patients:   BoilerBrush.com.cy  Fact Sheet for Healthcare Providers: https://pope.com/  This test is not yet approved or  cleared by the [place] FDA and has been authorized for detection and/or diagnosis of SARS-CoV-2 by FDA under an Emergency Use Authorization (EUA).  This EUA will remain in effect (meaning this test can be used) for the duration of the COVID-19 declaration under Section 564(b)(1) of the Act, 21 U.S.C. section 360bbb-3(b)(1), unless the authorization is terminated or revoked sooner.  Performed at [facility], [address], [place], [place] [address]     RADIOLOGY STUDIES/RESULTS: No results found.   LOS: 11 days   Elige Radon, MD  [facility] [phone]  To contact the attending provider between 7A-7P or the covering provider during after hours 7P-7A, please log into the web site [email or web address] and access using universal [place] password for that web site. If you do not have the password, please call the hospital operator.  02/25/2021, 7:34 AM

## 2021-02-25 NOTE — Care Management Important Message (Signed)
Important Message  Patient Details  Name: Fernando Maddox MRN: [id number] Date of Birth: 1961/10/27   Medicare Important Message Given:  Yes     [name] [name] 02/25/2021, 1:52 PM

## 2021-02-26 ENCOUNTER — Inpatient Hospital Stay (HOSPITAL_COMMUNITY): Payer: Medicare Other

## 2021-02-26 ENCOUNTER — Other Ambulatory Visit: Payer: Self-pay

## 2021-02-26 DIAGNOSIS — E11621 Type 2 diabetes mellitus with foot ulcer: Secondary | ICD-10-CM

## 2021-02-26 DIAGNOSIS — E871 Hypo-osmolality and hyponatremia: Secondary | ICD-10-CM | POA: Diagnosis not present

## 2021-02-26 DIAGNOSIS — I5041 Acute combined systolic (congestive) and diastolic (congestive) heart failure: Secondary | ICD-10-CM | POA: Diagnosis not present

## 2021-02-26 DIAGNOSIS — L97511 Non-pressure chronic ulcer of other part of right foot limited to breakdown of skin: Secondary | ICD-10-CM

## 2021-02-26 LAB — HEMOGLOBIN AND HEMATOCRIT, BLOOD
HCT: 33.7 % — ABNORMAL LOW (ref 39.0–52.0)
Hemoglobin: 11.2 g/dL — ABNORMAL LOW (ref 13.0–17.0)

## 2021-02-26 LAB — COMPREHENSIVE METABOLIC PANEL
ALT: 120 U/L — ABNORMAL HIGH (ref 0–44)
AST: 46 U/L — ABNORMAL HIGH (ref 15–41)
Albumin: 2.4 g/dL — ABNORMAL LOW (ref 3.5–5.0)
Alkaline Phosphatase: 120 U/L (ref 38–126)
Anion gap: 13 (ref 5–15)
BUN: 30 mg/dL — ABNORMAL HIGH (ref 6–20)
CO2: 22 mmol/L (ref 22–32)
Calcium: 8.3 mg/dL — ABNORMAL LOW (ref 8.9–10.3)
Chloride: 90 mmol/L — ABNORMAL LOW (ref 98–111)
Creatinine, Ser: 1.42 mg/dL — ABNORMAL HIGH (ref 0.61–1.24)
GFR, Estimated: 57 mL/min — ABNORMAL LOW (ref >60)
Glucose, Bld: 119 mg/dL — ABNORMAL HIGH (ref 70–99)
Potassium: 4.4 mmol/L (ref 3.5–5.1)
Sodium: 125 mmol/L — ABNORMAL LOW (ref 135–145)
Total Bilirubin: 2.2 mg/dL — ABNORMAL HIGH (ref 0.3–1.2)
Total Protein: 6.8 g/dL (ref 6.5–8.1)

## 2021-02-26 LAB — GLUCOSE, CAPILLARY
Glucose-Capillary: 110 mg/dL — ABNORMAL HIGH (ref 70–99)
Glucose-Capillary: 158 mg/dL — ABNORMAL HIGH (ref 70–99)
Glucose-Capillary: 137 mg/dL — ABNORMAL HIGH (ref 70–99)
Glucose-Capillary: 180 mg/dL — ABNORMAL HIGH (ref 70–99)

## 2021-02-26 LAB — BRAIN NATRIURETIC PEPTIDE: B Natriuretic Peptide: 1439.4 pg/mL — ABNORMAL HIGH (ref 0.0–100.0)

## 2021-02-26 MED ORDER — FUROSEMIDE 10 MG/ML IJ SOLN
40.0000 mg | Freq: Once | INTRAMUSCULAR | Status: AC
  Administered 2021-02-26: 40 mg via INTRAVENOUS
  Filled 2021-02-26: qty 4

## 2021-02-26 MED ORDER — DIPHENHYDRAMINE HCL 25 MG PO CAPS
25.0000 mg | ORAL_CAPSULE | Freq: Once | ORAL | Status: AC
  Administered 2021-02-26: 25 mg via ORAL
  Filled 2021-02-26: qty 1

## 2021-02-26 NOTE — Progress Notes (Addendum)
Overnight provider (Dr. Leafy Half) paged about patients behavior.   Patient c/o SOB when awake, gasping for air. Oxygen saturation remains 98% or higher. Lungs diminished and wheezing heard when awake. Oxygen on for comfort, however patient continuously pulls off oxygen and is removing gown, condom cath and pulse ox. Patient AOx3, confused on time. Refuses CPAP. Resp increased oxygen d/t patient c/o SOB when awake.  Per Dr. Leafy Half "at this time since patient is not in distress continue on course and will make a note for day team"  Provider paged again to give patient something for sleep. PRN ambien given at beginning of shift,uneffective.   Provider ordered Benadryl PO. Will continue to monitor.

## 2021-02-26 NOTE — Progress Notes (Signed)
Patient is status post left transmetatarsal amputation he is lying in bed Prevena VAC is functioning with 2 days left.   vac was removed today as patient is anticipating discharging to nursing facility has some skin maceration but wound edges are opposed no ascending cellulitis noted dry dressing was placed should follow-up in our office in 1 week

## 2021-02-26 NOTE — Progress Notes (Addendum)
HOSPITAL MEDICINE OVERNIGHT EVENT NOTE    Notified by nursing the patient unfortunately experienced a witnessed fall, resulting in injury to his left foot which is status post recent transmetatarsal amputation.  This suddenly resulted in copious amounts of bleeding from the surgical wound with what seems to be associated traumatic wound dehiscence.  I evaluated the patient at the bedside, along with nursing staff.  With significant amounts of direct pressure to the area of bleeding it seems to have substantially slowed.  We are attempting to tightly wrapped the dehisced wound with ABD and Kerlix, secured with tape.  Patient has experienced estimated 200 cc or more blood loss secondary to copious bleeding since the patient experienced a traumatic fall.  Patient is currently hemodynamically stable however performing every 15 minute blood pressures over the next hour which will then be slowly spaced out after that.  Obtaining stat H&H, last hemoglobin history 12.2 three days ago.  Is worth noting that patient is on daily Xarelto therapy for known history of atrial fibrillation.  We will discontinue Xarelto now which can be resumed by day provider once deemed safe.  If patient experiences substantial recurrent bleeding will consider administration of tranexamic acid.  No need for adexanet alpha administration unless bleeding is life-threatening.  I have instructed nursing to notify the on-call orthopedic provider for Dr. Lajoyce Corners as well.  Continue to monitor patient closely.  Marinda Elk  MD [facility]

## 2021-02-26 NOTE — Progress Notes (Signed)
Physical Therapy Treatment Patient Details Name: Fernando Maddox MRN: [id number] DOB: July 16, 1961 Today's Date: 02/26/2021    History of Present Illness Fernando Maddox is a 60 y.o. male who presents with necrotic ulceration and purulent drainage from the left forefoot involving the great toe second toe and third toe.  Patient is unsure when this started. On 4/27, pt underwent L transmetatarsal amputation. 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 PMH: a fib, DM, GERD, CVA, drug abuse, HTN.    PT Comments    Patient received in bed, moaning and groaning and perseverating on his breathing- repeatedly states "I can't breathe" even on supplemental O2 and with SPO2 in the 90s. Needed ModA of 1-2 for all mobility today as well as heavy cues for WB status on operative LE.  Able to get to EOB, and perform partial stand, however unable to get to chair as after attempting standing he impulsively sat down and laid back down in bed. ModA for rolling to get clean pads under him.  Left in bed in chair position with all needs met, alarm active. Continue to recommend SNF.    Follow Up Recommendations  SNF     Equipment Recommendations  Rolling walker with 5" wheels    Recommendations for Other Services       Precautions / Restrictions Precautions Precautions: Fall;Other (comment) Precaution Comments: very confused Required Braces or Orthoses: Other Brace Other Brace: post op shoe Restrictions Weight Bearing Restrictions: Yes LLE Weight Bearing: Partial weight bearing LLE Partial Weight Bearing Percentage or Pounds: weight bearing through heel with post op shoe    Mobility  Bed Mobility Overal bed mobility: Needs Assistance Bed Mobility: Supine to Sit;Sit to Supine;Rolling Rolling: Mod assist   Supine to sit: HOB elevated;Mod assist Sit to supine: Mod assist;HOB elevated   General bed mobility comments: ModA to manage BLEs, patient able to manage trunk with HOB max  elevated,  but often leaning to his left onto the bed    Transfers Overall transfer level: Needs assistance Equipment used: 2 person hand held assist Transfers: Sit to/from Stand Sit to Stand: +2 physical assistance;Mod assist         General transfer comment: cues for correct WB precautions, ModAx2 to partially raise hips off of bed but unable to complete full stand today  Ambulation/Gait             General Gait Details: unable- fatigue and safety   Stairs             Wheelchair Mobility    Modified Rankin (Stroke Patients Only)       Balance Overall balance assessment: Needs assistance Sitting-balance support: Feet supported;Bilateral upper extremity supported Sitting balance-Leahy Scale: Fair     Standing balance support: No upper extremity supported Standing balance-Leahy Scale: Poor Standing balance comment: high fall risk dynamically                            Cognition Arousal/Alertness: Lethargic Behavior During Therapy: Flat affect Overall Cognitive Status: Impaired/Different from baseline Area of Impairment: Attention;Following commands;Safety/judgement;Awareness;Problem solving;Orientation                 Orientation Level: Disoriented to;Time;Situation;Place Current Attention Level: Sustained   Following Commands: Follows one step commands inconsistently;Follows one step commands with increased time Safety/Judgement: Decreased awareness of safety;Decreased awareness of deficits Awareness: Intellectual Problem Solving: Slow processing;Decreased initiation;Difficulty sequencing;Requires verbal cues;Requires tactile cues General  Comments: inconsistent cue following- needs consistent simple multimodal cues and increased time, frequent redirection      Exercises      General Comments General comments (skin integrity, edema, etc.): on 5LPM but still perseverating on not being able to breath despite SPO2 being 96-100%.  Increased WOB.      Pertinent Vitals/Pain Pain Assessment: Faces Faces Pain Scale: Hurts little more Pain Location: generalized with movement Pain Descriptors / Indicators: Discomfort;Grimacing;Guarding Pain Intervention(s): Limited activity within patient's tolerance;Monitored during session;Repositioned    Home Living                      Prior Function            PT Goals (current goals can now be found in the care plan section) Acute Rehab PT Goals Patient Stated Goal: none stated PT Goal Formulation: Patient unable to participate in goal setting Time For Goal Achievement: 03/07/21 Potential to Achieve Goals: Good Progress towards PT goals: Not progressing toward goals - comment (lethargic, confused)    Frequency    Min 3X/week      PT Plan Current plan remains appropriate    Co-evaluation              AM-PAC PT "6 Clicks" Mobility   Outcome Measure  Help needed turning from your back to your side while in a flat bed without using bedrails?: A Lot Help needed moving from lying on your back to sitting on the side of a flat bed without using bedrails?: A Lot Help needed moving to and from a bed to a chair (including a wheelchair)?: Total Help needed standing up from a chair using your arms (e.g., wheelchair or bedside chair)?: Total Help needed to walk in hospital room?: Total Help needed climbing 3-5 steps with a railing? : Total 6 Click Score: 8    End of Session Equipment Utilized During Treatment: Gait belt Activity Tolerance: Patient limited by lethargy;Other (comment) (cognition) Patient left: in bed;with call bell/phone within reach;with bed alarm set;Other (comment) (bed in chair position) Nurse Communication: Mobility status PT Visit Diagnosis: Other abnormalities of gait and mobility (R26.89);Unsteadiness on feet (R26.81);Difficulty in walking, not elsewhere classified (R26.2)     Time: 2831-5176 PT Time Calculation (min) (ACUTE  ONLY): 27 min  Charges:  $Therapeutic Activity: 8-22 mins (co-tx with OT)                     Windell Norfolk, DPT, PN1   Supplemental Physical Therapist [place]    Pager [phone] Acute Rehab Office [phone]

## 2021-02-26 NOTE — Progress Notes (Signed)
Nutrition Follow-up  DOCUMENTATION CODES:   Not applicable  INTERVENTION:    Continue Glucerna Shake po BID, each supplement provides 220 kcal and 10 grams of protein.  Continue Magic cup TID with meals, each supplement provides 290 kcal and 9 grams of protein.  Continue MVI with minerals daily.  NUTRITION DIAGNOSIS:   Moderate Malnutrition related to chronic illness (CHF) as evidenced by mild muscle depletion,moderate muscle depletion,mild fat depletion,moderate fat depletion.  Ongoing  GOAL:   Patient will meet greater than or equal to 90% of their needs   Progressing   MONITOR:   PO intake,Supplement acceptance,Labs,Skin  REASON FOR ASSESSMENT:   Consult Assessment of nutrition requirement/status  ASSESSMENT:   59 yo male admitted with unresponsiveness, gangrene RLE, septic shock. PMH includes DM-2, HTN, CVA, A fib, CHF, CAD, substance abuse.  S/P RLE transmetatarsal amputation 4/27. VAC removed this morning in anticipation of discharge to SNF soon.   Remains on a carbohydrate modified diet with 1200 ml fluid restriction. Meal intakes: 75-100%  PO intake has improved. Patient is receiving magic cups on meal trays as well as being offered Glucerna Shake supplements BID between meals. He has refused Glucerna shakes twice today. Encouraged intake of supplements to ensure adequate calorie and protein intake to support wound healing.  Labs reviewed. Na 125 CBG: 120-153  Medications reviewed and include vitamin C, Colace, Lasix, Novolog SSI, MVI with minerals, zinc sulfate.  Diet Order:   Diet Order            Diet Carb Modified Fluid consistency: Thin; Room service appropriate? No; Fluid restriction: 1200 mL Fluid  Diet effective now                 EDUCATION NEEDS:   Education needs have been addressed  Skin:  Skin Assessment: Skin Integrity Issues: Skin Integrity Issues:: Other (Comment),DTI DTI: sacrum Other: L foot TMA site   Last BM:  5/2 type  2  Height:   Ht Readings from Last 1 Encounters:  02/18/21 6\' 3"  (1.905 m)    Weight:   Wt Readings from Last 1 Encounters:  02/26/21 102.7 kg    Ideal Body Weight:  89.1 kg  BMI:  Body mass index is 28.3 kg/m.  Estimated Nutritional Needs:   Kcal:  2500-2700  Protein:  140-160 gm  Fluid:  >/= 2.5 L    04/28/21, RD, LDN, CNSC Please refer to Amion for contact information.

## 2021-02-26 NOTE — TOC Progression Note (Signed)
Transition of Care [facility]) - Progression Note    Patient Details  Name: Fernando Maddox MRN: [id number] Date of Birth: [birth year]  Transition of Care [facility] CM/SW Contact  Mearl Latin, LCSW Phone Number: 02/26/2021, 12:22 PM  Clinical Narrative:    CSW updated [facility] that patient is not ready to discharge today. She reported that COVID test done yesterday will still be acceptable for tomorrow if patient is ready.    Expected Discharge Plan: Skilled Nursing Facility Barriers to Discharge: Continued Medical Work up  Expected Discharge Plan and Services Expected Discharge Plan: Skilled Nursing Facility   Discharge Planning Services: CM Consult   Living arrangements for the past 2 months: Single Family Home                                       Social Determinants of Health (SDOH) Interventions    Readmission Risk Interventions No flowsheet data found.

## 2021-02-26 NOTE — Plan of Care (Signed)
VSS. Patient barley slept throughout the night. Mitts placed, continuously pulling off equipment. Condom cath in place. Call bell within reach. Bed alarm on.  Problem: Education: Goal: Knowledge of the prescribed therapeutic regimen will improve Outcome: Progressing Goal: Ability to verbalize activity precautions or restrictions will improve Outcome: Progressing Goal: Understanding of discharge needs will improve Outcome: Progressing   Problem: Activity: Goal: Ability to perform//tolerate increased activity and mobilize with assistive devices will improve Outcome: Progressing   Problem: Clinical Measurements: Goal: Postoperative complications will be avoided or minimized Outcome: Progressing   Problem: Self-Care: Goal: Ability to meet self-care needs will improve Outcome: Progressing   Problem: Self-Concept: Goal: Ability to maintain and perform role responsibilities to the fullest extent possible will improve Outcome: Progressing   Problem: Pain Management: Goal: Pain level will decrease with appropriate interventions Outcome: Progressing   Problem: Skin Integrity: Goal: Demonstration of wound healing without infection will improve Outcome: Progressing

## 2021-02-26 NOTE — Progress Notes (Signed)
Occupational Therapy Treatment Patient Details Name: Fernando Maddox MRN: [id number] DOB: 05/01/1961 Today's Date: 02/26/2021    History of present illness Fernando Maddox is a 60 y.o. male who presents with necrotic ulceration and purulent drainage from the left forefoot involving the great toe second toe and third toe.  Patient is unsure when this started. On 4/27, pt underwent L transmetatarsal amputation. 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 PMH: a fib, DM, GERD, CVA, drug abuse, HTN.   OT comments  Pt received in bed, moaning and perseverating on inability to breathe throughout session despite SpO2 WFL on supplemental O2. Pt reported to nursing on desire to sit in chair near window, so session focused on achieving this goal. However, pt overall Mod A x 1-2 for all bed mobility and partial sit to stand attempt before pt impulsively returned to bed and declined further attempts due to need for rest. Pt with decreased initiation of assisting with ADLs, requiring Max A to Total A for dressing tasks. Continue to recommend SNF at DC.    Follow Up Recommendations  SNF;Supervision/Assistance - 24 hour    Equipment Recommendations  3 in 1 bedside commode;Wheelchair (measurements OT);Wheelchair cushion (measurements OT);Other (comment) (Rolling walker; pending progress)    Recommendations for Other Services      Precautions / Restrictions Precautions Precautions: Fall;Other (comment) Precaution Comments: very confused Required Braces or Orthoses: Other Brace Other Brace: darco shoe Restrictions Weight Bearing Restrictions: Yes LLE Weight Bearing: Partial weight bearing LLE Partial Weight Bearing Percentage or Pounds: weight bearing through heel with post op shoe Other Position/Activity Restrictions: WB through heel       Mobility Bed Mobility Overal bed mobility: Needs Assistance Bed Mobility: Supine to Sit;Sit to Supine;Rolling Rolling: Mod assist   Supine  to sit: HOB elevated;Mod assist Sit to supine: Mod assist;HOB elevated Sit to sidelying: Min assist General bed mobility comments: ModA to manage BLEs, patient able to manage trunk with HOB max elevated,  but often leaning to his left onto the bed    Transfers Overall transfer level: Needs assistance Equipment used: 2 person hand held assist Transfers: Sit to/from Stand Sit to Stand: +2 physical assistance;Mod assist         General transfer comment: cues for correct WB precautions, ModAx2 to partially raise hips off of bed but unable to complete full stand today    Balance Overall balance assessment: Needs assistance Sitting-balance support: Feet supported;Bilateral upper extremity supported Sitting balance-Leahy Scale: Fair     Standing balance support: No upper extremity supported Standing balance-Leahy Scale: Poor Standing balance comment: high fall risk dynamically                           ADL either performed or assessed with clinical judgement   ADL Overall ADL's : Needs assistance/impaired                 Upper Body Dressing : Maximal assistance;Bed level Upper Body Dressing Details (indicate cue type and Fernando): Max A to doff/don new hospital gown in bed, self limiting and poor initiation Lower Body Dressing: Total assistance;Sitting/lateral leans Lower Body Dressing Details (indicate cue type and Fernando): Total A to don darco shoe sitting EOB               General ADL Comments: Pt with self limiting behaviors throughout, cognitive deficits and decreased awareness of situation/WB precautions, etc. pt moaning throughout session.  Hoped to progress to chair transfer today due to pt reports to nursing of wanting to sit near window     Vision   Vision Assessment?: No apparent visual deficits   Perception     Praxis      Cognition Arousal/Alertness: Lethargic Behavior During Therapy: Flat affect Overall Cognitive Status:  Impaired/Different from baseline Area of Impairment: Attention;Following commands;Safety/judgement;Awareness;Problem solving;Orientation                 Orientation Level: Disoriented to;Time;Situation;Place Current Attention Level: Sustained   Following Commands: Follows one step commands inconsistently;Follows one step commands with increased time Safety/Judgement: Decreased awareness of safety;Decreased awareness of deficits Awareness: Intellectual Problem Solving: Slow processing;Decreased initiation;Difficulty sequencing;Requires verbal cues;Requires tactile cues General Comments: inconsistent cue following- needs consistent simple multimodal cues and increased time, frequent redirection        Exercises     Shoulder Instructions       General Comments on 5LPM but still perseverating on not being able to breath despite SPO2 being 96-100%. Increased WOB. BP WFL    Pertinent Vitals/ Pain       Pain Assessment: Faces Faces Pain Scale: Hurts a little bit Pain Location: generalized with movement Pain Descriptors / Indicators: Discomfort;Grimacing;Guarding;Moaning Pain Intervention(s): Monitored during session;Limited activity within patient's tolerance  Home Living                                          Prior Functioning/Environment              Frequency  Min 2X/week        Progress Toward Goals  OT Goals(current goals can now be found in the care plan section)  Progress towards OT goals: Not progressing toward goals - comment  Acute Rehab OT Goals Patient Stated Goal: none stated OT Goal Formulation: With patient Time For Goal Achievement: 03/06/21 Potential to Achieve Goals: Good ADL Goals Pt Will Perform Grooming: with modified independence;sitting Pt Will Perform Lower Body Bathing: with min assist;sitting/lateral leans;sit to/from stand Pt Will Perform Lower Body Dressing: with min assist;sitting/lateral leans;sit to/from  stand Pt Will Transfer to Toilet: with min assist;stand pivot transfer;bedside commode Additional ADL Goal #1: Pt to verbalize at least 2 fall prevention strategies  Plan Discharge plan remains appropriate    Co-evaluation    PT/OT/SLP Co-Evaluation/Treatment: Yes Fernando for Co-Treatment: Necessary to address cognition/behavior during functional activity;For patient/therapist safety;To address functional/ADL transfers   OT goals addressed during session: ADL's and self-care      AM-PAC OT "6 Clicks" Daily Activity     Outcome Measure   Help from another person eating meals?: A Little Help from another person taking care of personal grooming?: A Little Help from another person toileting, which includes using toliet, bedpan, or urinal?: Total Help from another person bathing (including washing, rinsing, drying)?: A Lot Help from another person to put on and taking off regular upper body clothing?: A Lot Help from another person to put on and taking off regular lower body clothing?: Total 6 Click Score: 12    End of Session Equipment Utilized During Treatment: Gait belt;Oxygen  OT Visit Diagnosis: Unsteadiness on feet (R26.81);Other abnormalities of gait and mobility (R26.89);Muscle weakness (generalized) (M62.81);Other symptoms and signs involving cognitive function   Activity Tolerance Other (comment) (limited by cognition, reports of decreased ability to breathe)   Patient Left in bed;with call bell/phone within reach;with  bed alarm set   Nurse Communication Mobility status        Time: 1962-2297 OT Time Calculation (min): 27 min  Charges: OT General Charges $OT Visit: 1 Visit OT Treatments $Self Care/Home Management : 8-22 mins  Bradd Canary, OTR/L Acute Rehab Services Office: [phone]   Lorre Munroe 02/26/2021, 10:43 AM

## 2021-02-26 NOTE — Progress Notes (Addendum)
PROGRESS NOTE        PATIENT DETAILS Name: Fernando Maddox Age: 60 y.o. Sex: male Date of Birth: Mar 23, 1961 Admit Date: 02/14/2021 Admitting Physician Charlott Holler, MD PCP:Van Domenic Schwab, MD  Brief Narrative: Patient is a 60 y.o. male with history of HFrEF, permanent atrial fibrillation, CAD, DM-2, known PAD-s/p partial amputation of right great toe presented to [facility] as a transfer from [facility] ED for septic shock in the setting of critical right lower extremity ischemia.  Significant events: 4/21>> transferred to [facility] from [facility]- found down-gangrene is right lower extremity-CT angio performed which showed concern for acute right arterial occlusion. 4/24>> transfer to [facility].  Significant studies: 4/21>> CT angio abdominal aorta with iliofemoral runoff: No opacification beyond mid SFA-suspicious for arterial embolism.  L1, L2, L3 and L4 vertebral bodies compression fractures.  Mildly enlarged left retroperitoneal, pelvic and inguinal lymph nodes. 4/21>> CT chest: No acute pulmonary disease. 4/21>> CT head: No acute intracranial abnormality 4/21>> CT C-spine: No acute fractures. 4/22>> chest x-ray: No active disease 4/22>> right lower extremity arterial duplex: No evidence of stenosis or occlusive disease. 4/22>> Echo: EF 20-25%, RV systolic function severely reduced  Antimicrobial therapy: Vancomycin: 4/21>>4/24 Zosyn: 4/21>> 4/24 Unasyn: 4/25>>4/28  Microbiology data: None  Procedures : 4/27>> left transmetatarsal amputation  Consults: Vascular surgery, PCCM, orthopedics, cardiology  DVT Prophylaxis : rivaroxaban (XARELTO) tablet 20 mg   Subjective: Lying in bed, eating with his fingers. No complaints.  Assessment/Plan:  Acute metabolic encephalopathy: unclear baseline but remains encephalopathic. Suspect its multifactorial including hyponatremia, hospital delirium, microvascular disease.  -delirium precautions -could consider trying lactulose  later this week if mental status does not improve and volume status continues to preclude holding lasix  Moderate Hyponatremia: SIADH vs heart failure. Minimal improvement with fluid restriction. He is appearing fairly euvolemic so may be driven primarily by SIADH at this point. Low suspicion for adrenal insufficiency in his clinical context. Could consider cirrhosis, cardiac or hepatic. Would ideally try to hold lasix however he continues feel short of breath so will hold off on this for now. -monitor sodium closely -consider samsa later this week  Gangrene of left forefoot s/p transmetatarsal amputation 4/27 Wound vac removed 5/3 -okay to be touchdown weightbearing on his heel. -PT/OT -plan to discharge to skilled facility for ongoing therapy -will follow up orthopedics one week after discharge  Acute hypoxic respiratory failure. Stable on 5L. Suspect it is attributable to pulm edema. No evidence of infection at this time. Likely also a component of sleep apnea. Acute on chronic combined heart failure (EF 20-25%), BiV heart failure Weights continuing to trend down.  History of CAD Permanent atrial fibrillation, RVR resolved. Rate controlled at this time. Management per cardiology - pressures precluding GDMT - continue lasix, holding spiro - AC with xarelto - continue with fluid restriction - recommend outpatient sleep study after discharge  Transaminitis: amiodarone induced vs hepatic congestion. Now off amio. LFTs continuing to trend down. Will continue to monitor.  DM-2 (A1c 7.7 on 4/25): CBGs at goal. Monitor closely with SSI.  Prior history of polysubstance use: UDS 3 years back positive for amphetamine/cannabinoids.  Septic shock 2/2 to gangrenous LLE resolved  Nutrition Problem: Nutrition Problem: Moderate Malnutrition Etiology: chronic illness (CHF) Signs/Symptoms: mild muscle depletion,moderate muscle depletion,mild fat depletion,moderate fat depletion Interventions:  MVI,Glucerna shake,Magic cup   Diet: Diet Order  Diet Carb Modified Fluid consistency: Thin; Room service appropriate? No; Fluid restriction: 1200 mL Fluid  Diet effective now                  Code Status: Full code   Family Communication: Called mother-[name]-[phone]-updated over the phone on 4/29.  Disposition Plan: Status is: Inpatient  Remains inpatient appropriate because:Inpatient level of care appropriate due to severity of illness   Dispo: The patient is from: Home              Anticipated d/c is to: tbd              Patient currently is not medically stable to d/c.   Difficult to place patient No   Barriers to Discharge: encephalopathy  PHYSICAL EXAM: Vital signs: Vitals:   02/26/21 0003 02/26/21 0417 02/26/21 0500 02/26/21 0807  BP: 100/88 111/85  108/87  Pulse: 89 95  94  Resp: 16 14  14   Temp: 97.7 F (36.5 C) (!) 97.5 F (36.4 C)  97.6 F (36.4 C)  TempSrc: Axillary Axillary  Axillary  SpO2: 97% 96%  96%  Weight:   102.7 kg   Height:       Filed Weights   02/24/21 0500 02/25/21 0639 02/26/21 0500  Weight: 103.7 kg 102.5 kg 102.7 kg   Body mass index is 28.3 kg/m.   Gen: chronically ill appearing, in NAD Cardiac: RRR, +1 left LE, trace edema on the right Pulm: breathing comfortably on 5L supplemental oxygen, diffuse rhonchi GI: soft, non-tender Neuro: awakens to voice. Answers questions appropriately intermittently. Oriented to person and place only. Not oriented to situation.   I have personally reviewed following labs and imaging studies  LABORATORY DATA: CBC: Recent Labs  Lab 02/20/21 0457 02/21/21 0124 02/22/21 0252 02/23/21 0037  WBC 14.2* 13.0* 9.5 11.2*  HGB 12.4* 12.6* 11.9* 12.2*  HCT 37.7* 37.7* 36.3* 36.2*  MCV 84.3 83.2 84.0 82.1  PLT 163 140* 130* 120*    Basic Metabolic Panel: Recent Labs  Lab 02/20/21 0457 02/21/21 0124 02/22/21 0252 02/23/21 0037 02/24/21 0742  02/25/21 0017 02/26/21 0238  NA 126* 127* 127* 127* 126* 128* 125*  K 4.1 5.3* 4.2 3.9 4.8 4.9 4.4  CL 90* 95* 93* 93* 94* 95* 90*  CO2 21* 12* 22 23 22 23 22   GLUCOSE 86 103* 108* 82 108* 115* 119*  BUN 17 20 24* 25* 24* 29* 30*  CREATININE 1.05 1.07 1.38* 1.25* 1.28* 1.67* 1.42*  CALCIUM 8.3* 8.2* 8.5* 8.2* 8.1* 8.2* 8.3*  MG 2.0 2.1  --   --   --   --   --     GFR: Estimated Creatinine Clearance: 72.7 mL/min (A) (by C-G formula based on SCr of 1.42 mg/dL (H)).  Liver Function Tests: Recent Labs  Lab 02/22/21 0252 02/23/21 0037 02/24/21 0742 02/25/21 0017 02/26/21 0238  AST 231* 144* 94* 71* 46*  ALT 259* 222* 169* 147* 120*  ALKPHOS 85 101 96 112 120  BILITOT 2.5* 2.2* 2.0* 1.9* 2.2*  PROT 6.0* 6.3* 6.0* 6.3* 6.8  ALBUMIN 2.4* 2.5* 2.3* 2.3* 2.4*   No results for input(s): LIPASE, AMYLASE in the last 168 hours. No results for input(s): AMMONIA in the last 168 hours.  Coagulation Profile: No results for input(s): INR, PROTIME in the last 168 hours.  Cardiac Enzymes: No results for input(s): CKTOTAL, CKMB, CKMBINDEX, TROPONINI in the last 168 hours.  BNP (last 3 results) No results for input(s): PROBNP in  the last 8760 hours.  Lipid Profile: No results for input(s): CHOL, HDL, LDLCALC, TRIG, CHOLHDL, LDLDIRECT in the last 72 hours.  Thyroid Function Tests: No results for input(s): TSH, T4TOTAL, FREET4, T3FREE, THYROIDAB in the last 72 hours.  Anemia Panel: No results for input(s): VITAMINB12, FOLATE, FERRITIN, TIBC, IRON, RETICCTPCT in the last 72 hours.  Urine analysis: No results found for: COLORURINE, APPEARANCEUR, LABSPEC, PHURINE, GLUCOSEU, HGBUR, BILIRUBINUR, KETONESUR, PROTEINUR, UROBILINOGEN, NITRITE, LEUKOCYTESUR  Sepsis Labs: Lactic Acid, Venous    Component Value Date/Time   LATICACIDVEN 1.6 02/15/2021 0250    MICROBIOLOGY: Recent Results (from the past 240 hour(s))  SARS Coronavirus 2 by RT PCR (hospital order, performed in [facility]  hospital lab) Nasopharyngeal Nasopharyngeal Swab     Status: None   Collection Time: 02/20/21  8:03 AM   Specimen: Nasopharyngeal Swab  Result Value Ref Range Status   SARS Coronavirus 2 NEGATIVE NEGATIVE Final    Comment: (NOTE) SARS-CoV-2 target nucleic acids are NOT DETECTED.  The SARS-CoV-2 RNA is generally detectable in upper and lower respiratory specimens during the acute phase of infection. The lowest concentration of SARS-CoV-2 viral copies this assay can detect is 250 copies / mL. A negative result does not preclude SARS-CoV-2 infection and should not be used as the sole basis for treatment or other patient management decisions.  A negative result may occur with improper specimen collection / handling, submission of specimen other than nasopharyngeal swab, presence of viral mutation(s) within the areas targeted by this assay, and inadequate number of viral copies (<250 copies / mL). A negative result must be combined with clinical observations, patient history, and epidemiological information.  Fact Sheet for Patients:   BoilerBrush.com.cy  Fact Sheet for Healthcare Providers: https://pope.com/  This test is not yet approved or  cleared by the [place] FDA and has been authorized for detection and/or diagnosis of SARS-CoV-2 by FDA under an Emergency Use Authorization (EUA).  This EUA will remain in effect (meaning this test can be used) for the duration of the COVID-19 declaration under Section 564(b)(1) of the Act, 21 U.S.C. section 360bbb-3(b)(1), unless the authorization is terminated or revoked sooner.  Performed at [facility], [address], [place], [place] [address]   SARS CORONAVIRUS 2 (TAT 6-24 HRS) Nasopharyngeal Nasopharyngeal Swab     Status: None   Collection Time: 02/25/21 12:53 PM   Specimen: Nasopharyngeal Swab  Result Value Ref Range Status   SARS Coronavirus 2 NEGATIVE NEGATIVE Final     Comment: (NOTE) SARS-CoV-2 target nucleic acids are NOT DETECTED.  The SARS-CoV-2 RNA is generally detectable in upper and lower respiratory specimens during the acute phase of infection. Negative results do not preclude SARS-CoV-2 infection, do not rule out co-infections with other pathogens, and should not be used as the sole basis for treatment or other patient management decisions. Negative results must be combined with clinical observations, patient history, and epidemiological information. The expected result is Negative.  Fact Sheet for Patients: HairSlick.no  Fact Sheet for Healthcare Providers: quierodirigir.com  This test is not yet approved or cleared by the [place] FDA and  has been authorized for detection and/or diagnosis of SARS-CoV-2 by FDA under an Emergency Use Authorization (EUA). This EUA will remain  in effect (meaning this test can be used) for the duration of the COVID-19 declaration under Se ction 564(b)(1) of the Act, 21 U.S.C. section 360bbb-3(b)(1), unless the authorization is terminated or revoked sooner.  Performed at [facility], [address] [place], [place] [address]     RADIOLOGY STUDIES/RESULTS: No results found.   LOS: 12 days   Elige Radon, MD  [facility] [phone]  To contact the attending provider between 7A-7P or the covering provider during after hours 7P-7A, please log into the web site [email or web address] and access using universal [place] password for that web site. If you do not have the password, please call the hospital operator.  02/26/2021, 9:01 AM

## 2021-02-26 NOTE — Progress Notes (Signed)
Progress Note  Patient Name: Fernando Maddox Date of Encounter: 02/26/2021  [facility] Cardiologist: Garwin Brothers, MD   Subjective   Complaining of shortness of breath this morning. Na worsening to 125 but appears clinically still volume up with elevated JVD CXR with stable Cardiomegaly BNP ordered  Inpatient Medications    Scheduled Meds: . vitamin C  1,000 mg Oral Daily  . Chlorhexidine Gluconate Cloth  6 each Topical Daily  . docusate sodium  100 mg Oral BID  . famotidine  20 mg Oral Daily  . feeding supplement (GLUCERNA SHAKE)  237 mL Oral BID BM  . insulin aspart  0-15 Units Subcutaneous TID WC  . insulin aspart  0-5 Units Subcutaneous QHS  . mouth rinse  15 mL Mouth Rinse BID  . multivitamin with minerals  1 tablet Oral Daily  . pantoprazole  40 mg Oral Daily  . rivaroxaban  20 mg Oral Q supper  . zinc sulfate  220 mg Oral Daily   Continuous Infusions: . sodium chloride 10 mL/hr at 02/21/21 0810   PRN Meds: acetaminophen **OR** acetaminophen, albuterol, alum & mag hydroxide-simeth, bisacodyl, guaiFENesin-dextromethorphan, magnesium citrate, ondansetron **OR** ondansetron (ZOFRAN) IV, oxyCODONE-acetaminophen, phenol, polyethylene glycol, zolpidem   Vital Signs    Vitals:   02/26/21 0003 02/26/21 0417 02/26/21 0500 02/26/21 0807  BP: 100/88 111/85  108/87  Pulse: 89 95  94  Resp: 16 14  14   Temp: 97.7 F (36.5 C) (!) 97.5 F (36.4 C)  97.6 F (36.4 C)  TempSrc: Axillary Axillary  Axillary  SpO2: 97% 96%  96%  Weight:   102.7 kg   Height:        Intake/Output Summary (Last 24 hours) at 02/26/2021 1204 Last data filed at 02/26/2021 04/28/2021 Gross per 24 hour  Intake 1080 ml  Output 1150 ml  Net -70 ml   Last 3 Weights 02/26/2021 02/25/2021 02/24/2021  Weight (lbs) 226 lb 6.6 oz 225 lb 15.5 oz 228 lb 9.9 oz  Weight (kg) 102.7 kg 102.5 kg 103.7 kg      Telemetry    Afib; fairly well rate controlled - Personally Reviewed  ECG    No new tracings -  Personally Reviewed  Physical Exam   GEN: Sleeping but intermittently wakes up with heavy breathing   Neck: JVD to his ear Cardiac: Irregular, no murmurs Respiratory: Tachypneic, faint expiratory wheezing GI: Soft, nontender, non-distended  MS: No edema; No deformity. Neuro:  Groaning, only intermittently answering questions Psych: Delirious  Labs    High Sensitivity Troponin:  No results for input(s): TROPONINIHS in the last 720 hours.    Chemistry Recent Labs  Lab 02/24/21 0742 02/25/21 0017 02/26/21 0238  NA 126* 128* 125*  K 4.8 4.9 4.4  CL 94* 95* 90*  CO2 22 23 22   GLUCOSE 108* 115* 119*  BUN 24* 29* 30*  CREATININE 1.28* 1.67* 1.42*  CALCIUM 8.1* 8.2* 8.3*  PROT 6.0* 6.3* 6.8  ALBUMIN 2.3* 2.3* 2.4*  AST 94* 71* 46*  ALT 169* 147* 120*  ALKPHOS 96 112 120  BILITOT 2.0* 1.9* 2.2*  GFRNONAA >60 47* 57*  ANIONGAP 10 10 13      Hematology Recent Labs  Lab 02/21/21 0124 02/22/21 0252 02/23/21 0037  WBC 13.0* 9.5 11.2*  RBC 4.53 4.32 4.41  HGB 12.6* 11.9* 12.2*  HCT 37.7* 36.3* 36.2*  MCV 83.2 84.0 82.1  MCH 27.8 27.5 27.7  MCHC 33.4 32.8 33.7  RDW 17.1* 17.1* 17.7*  PLT  140* 130* 120*    BNPNo results for input(s): BNP, PROBNP in the last 168 hours.   DDimer No results for input(s): DDIMER in the last 168 hours.   Radiology    DG Chest Port 1 View  Result Date: 02/26/2021 CLINICAL DATA:  Shortness of breath EXAM: PORTABLE CHEST 1 VIEW COMPARISON:  02/18/2021 chest radiograph FINDINGS: Unchanged, enlarged cardiac silhouette. There is no new focal airspace disease. No pleural effusion or pneumothorax. No acute osseous abnormality. Old, healed left clavicle injury. Chronic bilateral rib fractures noted. IMPRESSION: Unchanged cardiomegaly.  No new focal airspace disease Electronically Signed   By: Caprice Renshaw   On: 02/26/2021 10:12    Cardiac Studies   Echo 02/15/21: 1. Left ventricular ejection fraction, by estimation, is 20 to 25%. The  left  ventricle has severely decreased function. The left ventricle  demonstrates global hypokinesis. The left ventricular internal cavity size  was moderately dilated. Left  ventricular diastolic function could not be evaluated. There is the  interventricular septum is flattened in systole and diastole, consistent  with right ventricular pressure and volume overload.  2. Right ventricular systolic function is severely reduced. The right  ventricular size is moderately enlarged.  3. Left atrial size was moderately dilated.  4. Right atrial size was moderately dilated.  5. The mitral valve is normal in structure. Mild to moderate mitral valve  regurgitation.  6. Tricuspid valve regurgitation is mild to moderate.  7. The aortic valve is tricuspid. There is mild calcification of the  aortic valve. Aortic valve regurgitation is not visualized.  8. Aortic dilatation noted. There is borderline dilatation of the  ascending aorta, measuring 40 mm.  9. The inferior vena cava is dilated in size with <50% respiratory  variability, suggesting right atrial pressure of 15 mmHg.    Patient Profile     60 y.o. male with history of CAD, permanent Afib, HFrEF, HTN, HLD, DMII, CVA, PAD with nonhealing foot wounds, OSA on CPAP, and tobacco abuse who initially presented to [place] after being found down for 2 days. Was found to have LLE gangrene with concern for right arterial occlusion prompting transfer to [facility]. Formal vascular studies with normal ABI and negative LE dopplers for DVT. He was recommended for left transmetatarsal amputation. Cardiology was consulted on this admission for HFrEF and chronic Afib. Echocardiogram 4/22 showed EF of 20 to 25% with global hypokinesis,RV severely reduced, with moderate enlargement, LA/RAmoderately dilated,mild to moderate tricuspid regurgitation.No LV thrombus noted.  Assessment & Plan    #Permanent Atrial Fibrillation: CHADs-vasc 6. Rate control difficult  this admission due to hypotension, but overall improved. Amiodarone currently on hold due to elevated LFTs which are now improving. -On xarelto -Rate control held due to hypotension; can add back amiodarone if needed  #Acute on chronic combined systolic and diastolic heart failure TTE with LVEF 20-25% with global hypokinesis. GDMT limited due to hypotension. Clinically appears volume overloaded on examination today. -Got lasix 40mg  IV this AM -Check BNP; clinically appears volume overloaded with JVP -CXR with stable cardiomegaly; no effusions -GDMT limited due to hypotension; will add as able -Low Na diet -Daily weights as above, I&Os incomplete  #Hyponatremia: Worsened overnight despite fluid restriction. On lasix. -Repeat BNP -S/p lasix this AM; will adjust dosing pending BNP -Continue fluid restriction  #RV failure: #Transaminitis: -Diuresis as above; follow-up BNP -Transaminitis improving  #Septic shock: #LLE gangrene s/p left transmetatarsal amputation: #PAD: Slowly improving. S/p transmetatarsal amputation on 02/20/21 for LLE gangrene. -Management per ortho  and primary  #Metabolic Encephalopathy: -Management per primary  #HLD: -Holding statin due to transaminitis  #DMII: -Management per primary  #CVA: -Not on ASA due to need for [facility] -Resume statin once LFT improve  #Tobacco Abuse: -Encourage cessation  #AKI Improving overnight. -Continue to trend -Adjust diuresis pending BNP as above     For questions or updates, please contact [facility] Please consult [email or web address] for contact info under        Signed, Meriam Sprague, MD  02/26/2021, 12:04 PM

## 2021-02-27 DIAGNOSIS — Z515 Encounter for palliative care: Secondary | ICD-10-CM | POA: Diagnosis not present

## 2021-02-27 DIAGNOSIS — Z7189 Other specified counseling: Secondary | ICD-10-CM | POA: Diagnosis not present

## 2021-02-27 DIAGNOSIS — I5041 Acute combined systolic (congestive) and diastolic (congestive) heart failure: Secondary | ICD-10-CM | POA: Diagnosis not present

## 2021-02-27 LAB — COMPREHENSIVE METABOLIC PANEL
ALT: 87 U/L — ABNORMAL HIGH (ref 0–44)
AST: 33 U/L (ref 15–41)
Albumin: 2.2 g/dL — ABNORMAL LOW (ref 3.5–5.0)
Alkaline Phosphatase: 102 U/L (ref 38–126)
Anion gap: 5 (ref 5–15)
BUN: 24 mg/dL — ABNORMAL HIGH (ref 6–20)
CO2: 30 mmol/L (ref 22–32)
Calcium: 8.1 mg/dL — ABNORMAL LOW (ref 8.9–10.3)
Chloride: 92 mmol/L — ABNORMAL LOW (ref 98–111)
Creatinine, Ser: 1.26 mg/dL — ABNORMAL HIGH (ref 0.61–1.24)
GFR, Estimated: >60 mL/min (ref >60)
Glucose, Bld: 139 mg/dL — ABNORMAL HIGH (ref 70–99)
Potassium: 3.6 mmol/L (ref 3.5–5.1)
Sodium: 127 mmol/L — ABNORMAL LOW (ref 135–145)
Total Bilirubin: 2.5 mg/dL — ABNORMAL HIGH (ref 0.3–1.2)
Total Protein: 6 g/dL — ABNORMAL LOW (ref 6.5–8.1)

## 2021-02-27 LAB — CBC
HCT: 33.7 % — ABNORMAL LOW (ref 39.0–52.0)
Hemoglobin: 11.3 g/dL — ABNORMAL LOW (ref 13.0–17.0)
MCH: 27.8 pg (ref 26.0–34.0)
MCHC: 33.5 g/dL (ref 30.0–36.0)
MCV: 83 fL (ref 80.0–100.0)
Platelets: 168 10*3/uL (ref 150–400)
RBC: 4.06 MIL/uL — ABNORMAL LOW (ref 4.22–5.81)
RDW: 17.9 % — ABNORMAL HIGH (ref 11.5–15.5)
WBC: 6.4 10*3/uL (ref 4.0–10.5)
nRBC: 0 % (ref 0.0–0.2)

## 2021-02-27 LAB — BASIC METABOLIC PANEL
Anion gap: 10 (ref 5–15)
BUN: 25 mg/dL — ABNORMAL HIGH (ref 6–20)
CO2: 29 mmol/L (ref 22–32)
Calcium: 8 mg/dL — ABNORMAL LOW (ref 8.9–10.3)
Chloride: 91 mmol/L — ABNORMAL LOW (ref 98–111)
Creatinine, Ser: 1.33 mg/dL — ABNORMAL HIGH (ref 0.61–1.24)
GFR, Estimated: >60 mL/min (ref >60)
Glucose, Bld: 132 mg/dL — ABNORMAL HIGH (ref 70–99)
Potassium: 3.7 mmol/L (ref 3.5–5.1)
Sodium: 130 mmol/L — ABNORMAL LOW (ref 135–145)

## 2021-02-27 LAB — GLUCOSE, CAPILLARY
Glucose-Capillary: 175 mg/dL — ABNORMAL HIGH (ref 70–99)
Glucose-Capillary: 148 mg/dL — ABNORMAL HIGH (ref 70–99)
Glucose-Capillary: 153 mg/dL — ABNORMAL HIGH (ref 70–99)
Glucose-Capillary: 104 mg/dL — ABNORMAL HIGH (ref 70–99)

## 2021-02-27 MED ORDER — RIVAROXABAN 20 MG PO TABS
20.0000 mg | ORAL_TABLET | Freq: Every day | ORAL | Status: DC
  Administered 2021-02-27: 20 mg via ORAL
  Filled 2021-02-27: qty 1

## 2021-02-27 MED ORDER — FUROSEMIDE 10 MG/ML IJ SOLN
40.0000 mg | Freq: Two times a day (BID) | INTRAMUSCULAR | Status: DC
  Administered 2021-02-27 (×2): 40 mg via INTRAVENOUS
  Filled 2021-02-27 (×2): qty 4

## 2021-02-27 NOTE — Progress Notes (Signed)
Palliative:  HPI: 60 y.o. male  with past medical history of diabetes, CAD, heart failure EF 20-25%, severe RV failure, hypertension, stroke, atrial fibrillation, polysubstance abuse admitted on 02/14/2021 with septic shock with shortness of breath found down by family at his home. Found to have left lower leg gangrene with plans for left metatarsal amputation.   I met today at [facility] bedside but no family present. Fernando Maddox is alert and oriented although poor insight and difficult to stay focused on conversation. He had fall last night and surgical wound opened back up (dressing clean and intact). I discussed with Fernando Maddox my concern that he is very ill and concern for his wound, weak heart, and liver functions. I told him that I am worried about him and he seemed to understand in this moment the gravity of his situation. I asked Fernando Maddox if he were to get worse if he would want [place] to do CPR and hook him up to breathing machines and life support and Fernando Maddox pauses to think and then says "I don't think I want to go through that." He reiterated again that he would not want those measures but also that he wants to get better and continue other measures. He also comments that he has been "stupid" and we discussed how we unfortunately cannot change the past and decisions made but can only do our best moving forward. He gives me permission to speak with his mother.   I called and speak with mother, Fernando Maddox. She has good understanding of her son's situation and his poor overall prognosis. I explained to her my conversation with Fernando Maddox and his expressed wishes. Fernando Maddox is supportive of DNR and feels that resuscitation attempts would not be beneficial for Fernando Maddox. She is pleased that he was able to make this decision and that we were able to have this conversation with him. Fernando Maddox understands that he is high risk for further decline and complications and knows his life will be limited. At this time they are both hopeful that he can have some  improvement and measures to prolong his life short of resuscitation.   All questions/concerns addressed. Emotional support provided. Discussed with Dr. Sloan Leiter.   Exam: Alert, oriented but poor focus and insight/judgement. No distress. Breathing regular, unlabored. Abd flat. Moves all extremities. L foot amputation dressed and clean and intact.   Plan: - DNR established.  - Continue other interventions to optimize at this time.  - Will need further conversation regarding potential comfort options with further decline.   Rocky Mount, NP Palliative Medicine Team Pager [phone] (Please see [email or web address] for schedule) Team Phone [phone]    Greater than 50%  of this time was spent counseling and coordinating care related to the above assessment and plan

## 2021-02-27 NOTE — Progress Notes (Signed)
   02/26/21 2205  What Happened  Was fall witnessed? No  Was patient injured? Yes  Patient found on floor  Found by Staff-comment Thayer Ohm RN)  Stated prior activity  (pt was trying to get a drink of water)  Follow Up  MD notified Dr. Leafy Half  Time MD notified 2210  Family notified No - patient refusal (pt stated "he feels fine.")  Additional tests Yes-comment (H&H)  Simple treatment Dressing  Progress note created (see row info) Yes  Adult Fall Risk Assessment  Risk Factor Category (scoring not indicated) Fall has occurred during this admission (document High fall risk)  Patient Fall Risk Level High fall risk  Adult Fall Risk Interventions  Required Bundle Interventions *See Row Information* High fall risk - low, moderate, and high requirements implemented  Additional Interventions Use of appropriate toileting equipment (bedpan, BSC, etc.);Pharmacy review of medications  Screening for Fall Injury Risk (To be completed on HIGH fall risk patients) - Assessing Need for Floor Mats  Risk For Fall Injury- Criteria for Floor Mats Previous fall this admission  Will Implement Floor Mats Yes  Vitals  Temp 98 F (36.7 C)  Temp Source Oral  BP 117/87  BP Location Right Arm  BP Method Automatic  Patient Position (if appropriate) Lying  Pulse Rate (!) 103  Pulse Rate Source Monitor  ECG Heart Rate (!) 103  Resp 19  Oxygen Therapy  SpO2 92 %  O2 Device Room Air  Patient Activity (if Appropriate) Other (Comment)  Pain Assessment  Pain Scale 0-10  Pain Score 0  Neurological  Neuro (WDL) X  Level of Consciousness Alert  Orientation Level Oriented X4  Cognition Impulsive;Poor attention/concentration;Poor judgement;Poor safety awareness;Unable to follow commands  Speech Delayed responses  R Hand Grip Moderate;Present  L Hand Grip Moderate ;Present  R Foot Dorsiflexion Present;Moderate  L Foot Dorsiflexion Moderate;Weak  RUE Motor Response Purposeful movement;Responds to commands   LUE Motor Response Purposeful movement;Responds to commands  RLE Motor Response Purposeful movement;Responds to commands  LLE Motor Response Purposeful movement;Responds to commands  Neuro Symptoms Agitation;Forgetful;Anxiety  Glasgow Coma Scale  Eye Opening 4  Best Verbal Response (NON-intubated) 5  Best Motor Response 6  Glasgow Coma Scale Score 15  Musculoskeletal  Musculoskeletal (WDL) X  Generalized Weakness Yes  LLE Weight Bearing NWB (non-tender)  Musculoskeletal Details  LLE Amputated toes;Ortho/Supportive Device  LLE Ortho/Supportive Device Post-op Shoe  Integumentary  Integumentary (WDL) X  Skin Condition Dry  Skin Integrity Amputation;Excoriated (scratch marks);Abrasion;Cracking;Ecchymosis  Amputation Location Foot  Amputation Location Orientation Left  Amputation Intervention Other (Comment) (gauze and bleeding)  Skin Turgor Non-tenting

## 2021-02-27 NOTE — Progress Notes (Signed)
Ok to resume Xarelto with dinner per Dr. Jerral Ralph.  Ulyses Southward, PharmD, BCIDP, AAHIVP, CPP Infectious Disease Pharmacist 02/27/2021 1:01 PM

## 2021-02-27 NOTE — Progress Notes (Signed)
PROGRESS NOTE        PATIENT DETAILS Name: Fernando Maddox Age: 60 y.o. Sex: male Date of Birth: Feb 26, 1961 Admit Date: 02/14/2021 Admitting Physician Charlott Holler, MD PCP:Van Domenic Schwab, MD  Brief Narrative: Patient is a 60 y.o. male with history of HFrEF, permanent atrial fibrillation, CAD, DM-2, known PAD-s/p partial amputation of right great toe presented to [facility] as a transfer from [facility] ED for septic shock in the setting of critical right lower extremity ischemia.  Significant events: 4/21>> transferred to [facility] from [facility]- found down-gangrene is right lower extremity-CT angio performed which showed concern for acute right arterial occlusion. 4/24>> transfer to [facility]. 5/3>>fall overnight  Significant studies: 4/21>> CT angio abdominal aorta with iliofemoral runoff: No opacification beyond mid SFA-suspicious for arterial embolism.  L1, L2, L3 and L4 vertebral bodies compression fractures.  Mildly enlarged left retroperitoneal, pelvic and inguinal lymph nodes. 4/21>> CT chest: No acute pulmonary disease. 4/21>> CT head: No acute intracranial abnormality 4/21>> CT C-spine: No acute fractures. 4/22>> chest x-ray: No active disease 4/22>> right lower extremity arterial duplex: No evidence of stenosis or occlusive disease. 4/22>> Echo: EF 20-25%, RV systolic function severely reduced  Antimicrobial therapy: Vancomycin: 4/21>>4/24 Zosyn: 4/21>> 4/24 Unasyn: 4/25>>4/28  Microbiology data: None  Procedures : 4/27>> left transmetatarsal amputation  Consults: Vascular surgery, PCCM, orthopedics, cardiology  DVT Prophylaxis : xarelto (on hold)   Subjective: Resting in bed in NAD. Sleeping but awakens to answer questions intermittently.  Assessment/Plan:  Goals of care.  Palliative care initially consulted 4/27.  I had a fairly lengthy conversation with Ramses's mother and aunt yesterday afternoon. They acknowledge that he has a lot of chronic medical  issues affecting his overall quality of life.  -appreciate palliative care revisiting goals of care with pt and family  Acute on chronic encephalopathy: family notes that this is not far off from his baseline. Suspect its multifactorial including hyponatremia, hospital delirium, microvascular disease.  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 Dr. Lajoyce Corners made aware. Hgb stable -okay to be touchdown weightbearing on his heel. -PT/OT -plan to discharge to skilled facility for ongoing therapy -will follow up orthopedics one week after discharge  Ischemic CM, Acute on chronic combined heart failure (EF 20-25%), BiV heart failure BNP 1439 yesterday. Blood pressures remain soft #History of CAD s/p CABG #Permanent atrial fibrillation. Rate controlled Moderate Hyponatremia Management per cardiology - pressures precluding GDMT - cardiology increasing diuresis with 40mg  IV lasix BID, will need to monitor pressures and Na closely with this - AC with xarelto - continue with fluid restriction  #Untreated OSA. Encourage CPAP use at night or when sleeping  #Transaminitis: amiodarone induced vs hepatic congestion. Now off amio. LFTs continuing to trend down. Will continue to monitor.  #DM-2 CBGs at goal. Monitor closely with SSI.   #Prior history of polysubstance use: UDS 3 years back positive for amphetamine/cannabinoids.  #Acute hypoxic respiratory failure secondary to CHF exacerbation, OSA. Now on room air. Resolved. #Septic shock 2/2 to gangrenous LLE resolved  Nutrition Problem: Nutrition Problem: Moderate Malnutrition Etiology: chronic illness (CHF) Signs/Symptoms: mild muscle depletion,moderate muscle depletion,mild fat depletion,moderate fat depletion Interventions: MVI,Glucerna shake,Magic cup  Code Status: Full code   Family Communication: updated at bedside  5/3  Disposition Plan: SNF for subacute rehab when  medically stable Remains inpatient appropriate because:Inpatient level of care appropriate due to severity of illness  Barriers to Discharge: will require ongoing inpatient care for close monitoring of blood pressures and sodium with diuresis  PHYSICAL EXAM: Vital signs: Vitals:   02/26/21 2240 02/26/21 2300 02/26/21 2346 02/27/21 0402  BP: 96/75 101/74 102/81 97/71  Pulse:   96 81  Resp:   20 20  Temp:   98.6 F (37 C) 98 F (36.7 C)  TempSrc:   Axillary Axillary  SpO2:   95% 95%  Weight:      Height:       Filed Weights   02/24/21 0500 02/25/21 0639 02/26/21 0500  Weight: 103.7 kg 102.5 kg 102.7 kg   Body mass index is 28.3 kg/m.   Gen: chronically ill appearing Cardiac: RRR, trace lower extremity edema Pulm: breathing comfortably on room air, good air movement throughout GI: soft, not tender Skin: kerlix wrap to LLE. Dressing clean, intact.  I have personally reviewed following labs and imaging studies  LABORATORY DATA: CBC: Recent Labs  Lab 02/21/21 0124 02/22/21 0252 02/23/21 0037 02/26/21 2328  WBC 13.0* 9.5 11.2*  --   HGB 12.6* 11.9* 12.2* 11.2*  HCT 37.7* 36.3* 36.2* 33.7*  MCV 83.2 84.0 82.1  --   PLT 140* 130* 120*  --     Basic Metabolic Panel: Recent Labs  Lab 02/21/21 0124 02/22/21 0252 02/23/21 0037 02/24/21 0742 02/25/21 0017 02/26/21 0238  NA 127* 127* 127* 126* 128* 125*  K 5.3* 4.2 3.9 4.8 4.9 4.4  CL 95* 93* 93* 94* 95* 90*  CO2 12* 22 23 22 23 22   GLUCOSE 103* 108* 82 108* 115* 119*  BUN 20 24* 25* 24* 29* 30*  CREATININE 1.07 1.38* 1.25* 1.28* 1.67* 1.42*  CALCIUM 8.2* 8.5* 8.2* 8.1* 8.2* 8.3*  MG 2.1  --   --   --   --   --     GFR: Estimated Creatinine Clearance: 72.7 mL/min (A) (by C-G formula based on SCr of 1.42 mg/dL (H)).  Liver Function Tests: Recent Labs  Lab 02/22/21 0252 02/23/21 0037 02/24/21 0742 02/25/21 0017 02/26/21 0238  AST 231* 144* 94* 71* 46*   ALT 259* 222* 169* 147* 120*  ALKPHOS 85 101 96 112 120  BILITOT 2.5* 2.2* 2.0* 1.9* 2.2*  PROT 6.0* 6.3* 6.0* 6.3* 6.8  ALBUMIN 2.4* 2.5* 2.3* 2.3* 2.4*   No results for input(s): LIPASE, AMYLASE in the last 168 hours. No results for input(s): AMMONIA in the last 168 hours.  Coagulation Profile: No results for input(s): INR, PROTIME in the last 168 hours.  Cardiac Enzymes: No results for input(s): CKTOTAL, CKMB, CKMBINDEX, TROPONINI in the last 168 hours.  BNP (last 3 results) No results for input(s): PROBNP in the last 8760 hours.  Lipid Profile: No results for input(s): CHOL, HDL, LDLCALC, TRIG, CHOLHDL, LDLDIRECT in the last 72 hours.  Thyroid Function Tests: No results for input(s): TSH, T4TOTAL, FREET4, T3FREE, THYROIDAB in the last 72 hours.  Anemia Panel: No results for input(s): VITAMINB12, FOLATE, FERRITIN, TIBC, IRON, RETICCTPCT in the last 72 hours.  Urine analysis: No results found for: COLORURINE, APPEARANCEUR, LABSPEC, PHURINE, GLUCOSEU, HGBUR, BILIRUBINUR, KETONESUR, PROTEINUR, UROBILINOGEN, NITRITE, LEUKOCYTESUR  Sepsis Labs: Lactic Acid, Venous    Component Value Date/Time   LATICACIDVEN 1.6 02/15/2021 0250    MICROBIOLOGY: Recent Results (from the past 240 hour(s))  SARS Coronavirus 2 by RT PCR (hospital order, performed in [facility] hospital lab) Nasopharyngeal Nasopharyngeal  Swab     Status: None   Collection Time: 02/20/21  8:03 AM   Specimen: Nasopharyngeal Swab  Result Value Ref Range Status   SARS Coronavirus 2 NEGATIVE NEGATIVE Final    Comment: (NOTE) SARS-CoV-2 target nucleic acids are NOT DETECTED.  The SARS-CoV-2 RNA is generally detectable in upper and lower respiratory specimens during the acute phase of infection. The lowest concentration of SARS-CoV-2 viral copies this assay can detect is 250 copies / mL. A negative result does not preclude SARS-CoV-2 infection and should not be used as the sole basis for treatment or  other patient management decisions.  A negative result may occur with improper specimen collection / handling, submission of specimen other than nasopharyngeal swab, presence of viral mutation(s) within the areas targeted by this assay, and inadequate number of viral copies (<250 copies / mL). A negative result must be combined with clinical observations, patient history, and epidemiological information.  Fact Sheet for Patients:   BoilerBrush.com.cy  Fact Sheet for Healthcare Providers: https://pope.com/  This test is not yet approved or  cleared by the [place] FDA and has been authorized for detection and/or diagnosis of SARS-CoV-2 by FDA under an Emergency Use Authorization (EUA).  This EUA will remain in effect (meaning this test can be used) for the duration of the COVID-19 declaration under Section 564(b)(1) of the Act, 21 U.S.C. section 360bbb-3(b)(1), unless the authorization is terminated or revoked sooner.  Performed at [facility], [address] [place], [place] [address]   SARS CORONAVIRUS 2 (TAT 6-24 HRS) Nasopharyngeal Nasopharyngeal Swab     Status: None   Collection Time: 02/25/21 12:53 PM   Specimen: Nasopharyngeal Swab  Result Value Ref Range Status   SARS Coronavirus 2 NEGATIVE NEGATIVE Final    Comment: (NOTE) SARS-CoV-2 target nucleic acids are NOT DETECTED.  The SARS-CoV-2 RNA is generally detectable in upper and lower respiratory specimens during the acute phase of infection. Negative results do not preclude SARS-CoV-2 infection, do not rule out co-infections with other pathogens, and should not be used as the sole basis for treatment or other patient management decisions. Negative results must be combined with clinical observations, patient history, and epidemiological information. The expected result is Negative.  Fact Sheet for Patients: HairSlick.no  Fact  Sheet for Healthcare Providers: quierodirigir.com  This test is not yet approved or cleared by the [place] FDA and  has been authorized for detection and/or diagnosis of SARS-CoV-2 by FDA under an Emergency Use Authorization (EUA). This EUA will remain  in effect (meaning this test can be used) for the duration of the COVID-19 declaration under Se ction 564(b)(1) of the Act, 21 U.S.C. section 360bbb-3(b)(1), unless the authorization is terminated or revoked sooner.  Performed at [facility], [address], [place], [place] [address]     RADIOLOGY STUDIES/RESULTS: DG Chest Port 1 View  Result Date: 02/26/2021 CLINICAL DATA:  Shortness of breath EXAM: PORTABLE CHEST 1 VIEW COMPARISON:  02/18/2021 chest radiograph FINDINGS: Unchanged, enlarged cardiac silhouette. There is no new focal airspace disease. No pleural effusion or pneumothorax. No acute osseous abnormality. Old, healed left clavicle injury. Chronic bilateral rib fractures noted. IMPRESSION: Unchanged cardiomegaly.  No new focal airspace disease Electronically Signed   By: Caprice Renshaw   On: 02/26/2021 10:12     LOS: 13 days   Vernice Bowker Ephriam Knuckles, MD  [facility] [phone]  To contact the attending provider between 7A-7P or the covering provider during after hours 7P-7A, please  log into the web site [email or web address] and access using universal [place] password for that web site. If you do not have the password, please call the hospital operator.  02/27/2021, 7:19 AM

## 2021-02-27 NOTE — Progress Notes (Addendum)
Progress Note  Patient Name: Fernando Maddox Date of Encounter: 02/27/2021  [facility] Cardiologist: Garwin Brothers, MD   Subjective   Feels better this morning. Breathing improved. Left foot pain improved.   Had fall overnight where he injured his recent TMA surgical site. Xarelto held due to bleeding. Ortho notified.   BNP elevated at 1439. Remains very volume overloaded. Na improved to 127; Cr improved to 1.26. Net negative 2100 yesterday.  Inpatient Medications    Scheduled Meds: . vitamin C  1,000 mg Oral Daily  . Chlorhexidine Gluconate Cloth  6 each Topical Daily  . docusate sodium  100 mg Oral BID  . famotidine  20 mg Oral Daily  . feeding supplement (GLUCERNA SHAKE)  237 mL Oral BID BM  . insulin aspart  0-15 Units Subcutaneous TID WC  . insulin aspart  0-5 Units Subcutaneous QHS  . mouth rinse  15 mL Mouth Rinse BID  . multivitamin with minerals  1 tablet Oral Daily  . pantoprazole  40 mg Oral Daily  . zinc sulfate  220 mg Oral Daily   Continuous Infusions: . sodium chloride 10 mL/hr at 02/21/21 0810   PRN Meds: acetaminophen **OR** acetaminophen, albuterol, alum & mag hydroxide-simeth, bisacodyl, guaiFENesin-dextromethorphan, magnesium citrate, ondansetron **OR** ondansetron (ZOFRAN) IV, oxyCODONE-acetaminophen, phenol, polyethylene glycol, zolpidem   Vital Signs    Vitals:   02/26/21 2300 02/26/21 2346 02/27/21 0402 02/27/21 0740  BP: 101/74 102/81 97/71   Pulse:  96 81   Resp:  20 20 19   Temp:  98.6 F (37 C) 98 F (36.7 C) 97.8 F (36.6 C)  TempSrc:  Axillary Axillary Axillary  SpO2:  95% 95%   Weight:    105.3 kg  Height:        Intake/Output Summary (Last 24 hours) at 02/27/2021 04/29/2021 Last data filed at 02/27/2021 0102 Gross per 24 hour  Intake 240 ml  Output 2700 ml  Net -2460 ml   Last 3 Weights 02/27/2021 02/26/2021 02/25/2021  Weight (lbs) 232 lb 2.3 oz 226 lb 6.6 oz 225 lb 15.5 oz  Weight (kg) 105.3 kg 102.7 kg 102.5 kg      Telemetry     Afib with PVCs - Personally Reviewed  ECG    No new tracing - Personally Reviewed  Physical Exam   GEN: Sitting up in bed, more awake this morning  Neck: JVD to mid-neck Cardiac: Irregular, no murmurs Respiratory: Faint crackles at the bases GI: Soft, nontender, non-distended  MS: Left TMA site with dressing in place which is c/d/i. Neuro:  Nonfocal, more interactive and alert today Psych: Confused but more alert and interactive today  Labs    High Sensitivity Troponin:  No results for input(s): TROPONINIHS in the last 720 hours.    Chemistry Recent Labs  Lab 02/25/21 0017 02/26/21 0238 02/27/21 0701  NA 128* 125* 127*  K 4.9 4.4 3.6  CL 95* 90* 92*  CO2 23 22 30   GLUCOSE 115* 119* 139*  BUN 29* 30* 24*  CREATININE 1.67* 1.42* 1.26*  CALCIUM 8.2* 8.3* 8.1*  PROT 6.3* 6.8 6.0*  ALBUMIN 2.3* 2.4* 2.2*  AST 71* 46* 33  ALT 147* 120* 87*  ALKPHOS 112 120 102  BILITOT 1.9* 2.2* 2.5*  GFRNONAA 47* 57* >60  ANIONGAP 10 13 5      Hematology Recent Labs  Lab 02/22/21 0252 02/23/21 0037 02/26/21 2328 02/27/21 0701  WBC 9.5 11.2*  --  6.4  RBC 4.32 4.41  --  4.06*  HGB 11.9* 12.2* 11.2* 11.3*  HCT 36.3* 36.2* 33.7* 33.7*  MCV 84.0 82.1  --  83.0  MCH 27.5 27.7  --  27.8  MCHC 32.8 33.7  --  33.5  RDW 17.1* 17.7*  --  17.9*  PLT 130* 120*  --  168    BNP Recent Labs  Lab 02/26/21 1641  BNP 1,439.4*     DDimer No results for input(s): DDIMER in the last 168 hours.   Radiology    DG Chest Port 1 View  Result Date: 02/26/2021 CLINICAL DATA:  Shortness of breath EXAM: PORTABLE CHEST 1 VIEW COMPARISON:  02/18/2021 chest radiograph FINDINGS: Unchanged, enlarged cardiac silhouette. There is no new focal airspace disease. No pleural effusion or pneumothorax. No acute osseous abnormality. Old, healed left clavicle injury. Chronic bilateral rib fractures noted. IMPRESSION: Unchanged cardiomegaly.  No new focal airspace disease Electronically Signed   By: Caprice Renshaw   On: 02/26/2021 10:12    Cardiac Studies    Echo 02/15/21: 1. Left ventricular ejection fraction, by estimation, is 20 to 25%. The  left ventricle has severely decreased function. The left ventricle  demonstrates global hypokinesis. The left ventricular internal cavity size  was moderately dilated. Left  ventricular diastolic function could not be evaluated. There is the  interventricular septum is flattened in systole and diastole, consistent  with right ventricular pressure and volume overload.  2. Right ventricular systolic function is severely reduced. The right  ventricular size is moderately enlarged.  3. Left atrial size was moderately dilated.  4. Right atrial size was moderately dilated.  5. The mitral valve is normal in structure. Mild to moderate mitral valve  regurgitation.  6. Tricuspid valve regurgitation is mild to moderate.  7. The aortic valve is tricuspid. There is mild calcification of the  aortic valve. Aortic valve regurgitation is not visualized.  8. Aortic dilatation noted. There is borderline dilatation of the  ascending aorta, measuring 40 mm.  9. The inferior vena cava is dilated in size with <50% respiratory  variability, suggesting right atrial pressure of 15 mmHg.   Patient Profile     60 y.o. male with history of CAD, permanent Afib, HFrEF, HTN, HLD, DMII, CVA, PAD with nonhealing foot wounds, OSA on CPAP, and tobacco abuse who initially presented to [place] after being found down for 2 days. Was found to have LLE gangrene with concern for right arterial occlusion prompting transfer to [facility]. Formal vascular studies with normal ABI and negative LE dopplers for DVT. He was recommended for left transmetatarsal amputation. Cardiology was consulted on this admission for HFrEF and chronic Afib.Echocardiogram 4/22 showed EF of 20 to 25% with global hypokinesis,RV severely reduced, with moderate enlargement, LA/RAmoderately dilated,mild to moderate  tricuspid regurgitation.No LV thrombus noted.  Assessment & Plan    #Acute on chronic combined systolic and diastolic heart failure TTE with LVEF 20-25% with global hypokinesis. GDMT limited due to hypotension. Clinically appears volume overloaded with BNP 1439. Currently with NYHA class III symptoms.  -Needs more aggressive diuresis; start lasix 40mg  IV BID -Will trend Na BID with diuresis -If Na worsens with diuresis despite fluid restriction, can consider dose of tolvaptan -GDMT limited due to hypotension; will add as able -Low Na diet -Daily weights as above, I&Os incomplete  #Permanent Atrial Fibrillation: CHADs-vasc 6. Not on rate controlling agents currently due to previous hypotension and elevated LFTs. Overall rates controlled. Xarelto stopped overnight on 02/26/21 due to fall with bleeding from recent TMA  site -Xarelto held due to bleeding from TMA surgical site after fall; resume once able -Rate control held due to hypotension; can add back amiodarone if needed  #Hyponatremia: Improved to 127 this AM. Remains clinically very volume overloaded.  -Diuresis as above -Monitor BMET BID -If Na worsens with diuresis despite fluid restriction, can do a dose of tolvaptan  #RV failure: #Transaminitis: -Diuresis as above -Transaminitis improving  #Unwittnessed Fall: Occurred on evening of 5/3. Had bleeding from TMA site. Xarelto held. -Management per primary and ortho -Xarelto held -H/H stable  #Septic shock: #LLE gangrene s/p left transmetatarsal amputation: #PAD: Slowly improving. S/p transmetatarsal amputation on 02/20/21 for LLE gangrene. -Management per ortho and primary  #Metabolic Encephalopathy: -Management per primary  #HLD: -Holding statin due to transaminitis  #DMII: -Management per primary  #CVA: -Not on ASA due to need for [facility] -Resume statin once LFT improve  #Tobacco Abuse: -Encourage cessation  #AKI Improving overnight. -Continue to  trend -Adjust diuresis pending BNP as above      For questions or updates, please contact [facility] Please consult [email or web address] for contact info under        Signed, Meriam Sprague, MD  02/27/2021, 8:22 AM

## 2021-02-27 NOTE — Progress Notes (Incomplete)
Progress Note  Patient Name: Fernando Maddox Date of Encounter: 02/27/2021  Primary Cardiologist: Garwin Brothers, MD   Subjective   ***  Inpatient Medications    Scheduled Meds: . vitamin C  1,000 mg Oral Daily  . Chlorhexidine Gluconate Cloth  6 each Topical Daily  . docusate sodium  100 mg Oral BID  . famotidine  20 mg Oral Daily  . feeding supplement (GLUCERNA SHAKE)  237 mL Oral BID BM  . insulin aspart  0-15 Units Subcutaneous TID WC  . insulin aspart  0-5 Units Subcutaneous QHS  . mouth rinse  15 mL Mouth Rinse BID  . multivitamin with minerals  1 tablet Oral Daily  . pantoprazole  40 mg Oral Daily  . zinc sulfate  220 mg Oral Daily   Continuous Infusions: . sodium chloride 10 mL/hr at 02/21/21 0810   PRN Meds: acetaminophen **OR** acetaminophen, albuterol, alum & mag hydroxide-simeth, bisacodyl, guaiFENesin-dextromethorphan, magnesium citrate, ondansetron **OR** ondansetron (ZOFRAN) IV, oxyCODONE-acetaminophen, phenol, polyethylene glycol, zolpidem   Vital Signs    Vitals:   02/26/21 2240 02/26/21 2300 02/26/21 2346 02/27/21 0402  BP: 96/75 101/74 102/81 97/71  Pulse:   96 81  Resp:   20 20  Temp:   98.6 F (37 C) 98 F (36.7 C)  TempSrc:   Axillary Axillary  SpO2:   95% 95%  Weight:      Height:        Intake/Output Summary (Last 24 hours) at 02/27/2021 0505 Last data filed at 02/27/2021 0102 Gross per 24 hour  Intake 600 ml  Output 3000 ml  Net -2400 ml   Filed Weights   02/24/21 0500 02/25/21 0639 02/26/21 0500  Weight: 103.7 kg 102.5 kg 102.7 kg    Physical Exam   GEN: Well nourished, well developed, in no acute distress.  HEENT: Grossly normal.  Neck: Supple, no JVD, carotid bruits, or masses. Cardiac: RRR, no murmurs, rubs, or gallops. No clubbing, cyanosis, edema.  Radials/DP/PT 2+ and equal bilaterally.  Respiratory:  Respirations regular and unlabored, clear to auscultation bilaterally. GI: Soft, nontender, nondistended, BS + x  4. MS: no deformity or atrophy. Skin: warm and dry, no rash. Neuro:  Strength and sensation are intact. Psych: AAOx3.  Normal affect.  Labs    Chemistry Recent Labs  Lab 02/24/21 0742 02/25/21 0017 02/26/21 0238  NA 126* 128* 125*  K 4.8 4.9 4.4  CL 94* 95* 90*  CO2 22 23 22   GLUCOSE 108* 115* 119*  BUN 24* 29* 30*  CREATININE 1.28* 1.67* 1.42*  CALCIUM 8.1* 8.2* 8.3*  PROT 6.0* 6.3* 6.8  ALBUMIN 2.3* 2.3* 2.4*  AST 94* 71* 46*  ALT 169* 147* 120*  ALKPHOS 96 112 120  BILITOT 2.0* 1.9* 2.2*  GFRNONAA >60 47* 57*  ANIONGAP 10 10 13      Hematology Recent Labs  Lab 02/21/21 0124 02/22/21 0252 02/23/21 0037 02/26/21 2328  WBC 13.0* 9.5 11.2*  --   RBC 4.53 4.32 4.41  --   HGB 12.6* 11.9* 12.2* 11.2*  HCT 37.7* 36.3* 36.2* 33.7*  MCV 83.2 84.0 82.1  --   MCH 27.8 27.5 27.7  --   MCHC 33.4 32.8 33.7  --   RDW 17.1* 17.1* 17.7*  --   PLT 140* 130* 120*  --     Cardiac EnzymesNo results for input(s): TROPONINI in the last 168 hours. No results for input(s): TROPIPOC in the last 168 hours.   BNP Recent  Labs  Lab 02/26/21 1641  BNP 1,439.4*     DDimer No results for input(s): DDIMER in the last 168 hours.   Radiology    DG Chest Port 1 View  Result Date: 02/26/2021 CLINICAL DATA:  Shortness of breath EXAM: PORTABLE CHEST 1 VIEW COMPARISON:  02/18/2021 chest radiograph FINDINGS: Unchanged, enlarged cardiac silhouette. There is no new focal airspace disease. No pleural effusion or pneumothorax. No acute osseous abnormality. Old, healed left clavicle injury. Chronic bilateral rib fractures noted. IMPRESSION: Unchanged cardiomegaly.  No new focal airspace disease Electronically Signed   By: Caprice Renshaw   On: 02/26/2021 10:12    Telemetry    *** - Personally Reviewed  ECG    *** - Personally Reviewed  Cardiac Studies    Echo 02/15/21: 1. Left ventricular ejection fraction, by estimation, is 20 to 25%. The  left ventricle has severely decreased  function. The left ventricle  demonstrates global hypokinesis. The left ventricular internal cavity size  was moderately dilated. Left  ventricular diastolic function could not be evaluated. There is the  interventricular septum is flattened in systole and diastole, consistent  with right ventricular pressure and volume overload.  2. Right ventricular systolic function is severely reduced. The right  ventricular size is moderately enlarged.  3. Left atrial size was moderately dilated.  4. Right atrial size was moderately dilated.  5. The mitral valve is normal in structure. Mild to moderate mitral valve  regurgitation.  6. Tricuspid valve regurgitation is mild to moderate.  7. The aortic valve is tricuspid. There is mild calcification of the  aortic valve. Aortic valve regurgitation is not visualized.  8. Aortic dilatation noted. There is borderline dilatation of the  ascending aorta, measuring 40 mm.  9. The inferior vena cava is dilated in size with <50% respiratory  variability, suggesting right atrial pressure of 15 mmHg.   Patient Profile     60 y.o. male with history of CAD, permanent Afib, HFrEF, HTN, HLD, DMII, CVA, PAD with nonhealing foot wounds, OSA on CPAP, and tobacco abuse who initially presented to [facility] after being found down for 2 days. Was found to have LLE gangrene with concern for right arterial occlusion prompting transfer to [facility]. Formal vascular studies with normal ABI and negative LE dopplers for DVT. He was recommended for left transmetatarsal amputation. Cardiology was consulted on this admission for HFrEF and chronic Afib.Echocardiogram 4/22 showed EF of 20 to 25% with global hypokinesis,RV severely reduced, with moderate enlargement, LA/RAmoderately dilated,mild to moderate tricuspid regurgitation.No LV thrombus noted.  Assessment & Plan    1. Permanent atrial fibrillation: -Rate control has been difficult secondary to hypotension -Amiodarone  currently on hold due to elevated LFTs -Xarelto held overnight in the setting of patient fall with surgical wound dehiscence with copious bleeding -Hemoglobin found to be stable at 11.2>> follow trend -Heart rates appear to be stable in the 80s to low 100s -Plan to add Xarelto back to her regimen once stable from a bleeding standpoint per Ortho and primary teams -CHA2DS2VASc score of 6  2. Acute on chronic combined systolic and diastolic CHF: -Echocardiogram with LVEF of 20 to 25% with global hypokinesis -GDMT limited secondary to hypotension although clinically appears to be fluid volume overloaded -BMP from yesterday markedly elevated at 1439>> plan to trial Lasix today and follow clinical status -CXR with stable cardiomegaly and no effusions -Weight, 226lb today with an admission weight of 200lb  -I&O, net -4.1 L  3.  Hyponatremia: -Na+, -Currently  on fluid restriction, Lasix  4.  Septic shock secondary to L LE gangrene s/p left transmetatarsal amputation: -Unfortunately patient suffered a fall overnight resulting in what appeared to be physical wound dehiscence.  Per MD note, adequate manual pressure with redressing performed with bleeding controlled. -Management per Ortho, primary team  5.  Metabolic encephalopathy: -Management per primary team  6.  HLD: -Holding statin secondary to transaminitis  7.  DM2: -Hemoglobin A1c, 7.7 on 02/18/2021 -Management per primary team  8.  History of CVA: -Not on ASA secondary to anticoagulation -Plan to resume statin once LFTs improve  9.  AKI: -Creatinine, 1.42 yesterday  Signed, Georgie Chard NP-C [facility] Pager: [phone] 02/27/2021, 5:05 AM     For questions or updates, please contact   Please consult [email or web address] for contact info under Cardiology/STEMI.

## 2021-02-28 DIAGNOSIS — R6521 Severe sepsis with septic shock: Secondary | ICD-10-CM | POA: Diagnosis not present

## 2021-02-28 DIAGNOSIS — A419 Sepsis, unspecified organism: Secondary | ICD-10-CM | POA: Diagnosis not present

## 2021-02-28 DIAGNOSIS — L97524 Non-pressure chronic ulcer of other part of left foot with necrosis of bone: Secondary | ICD-10-CM | POA: Diagnosis not present

## 2021-02-28 LAB — CBC
HCT: 31.5 % — ABNORMAL LOW (ref 39.0–52.0)
Hemoglobin: 10.3 g/dL — ABNORMAL LOW (ref 13.0–17.0)
MCH: 27.7 pg (ref 26.0–34.0)
MCHC: 32.7 g/dL (ref 30.0–36.0)
MCV: 84.7 fL (ref 80.0–100.0)
Platelets: 173 10*3/uL (ref 150–400)
RBC: 3.72 MIL/uL — ABNORMAL LOW (ref 4.22–5.81)
RDW: 18 % — ABNORMAL HIGH (ref 11.5–15.5)
WBC: 5.3 10*3/uL (ref 4.0–10.5)
nRBC: 0 % (ref 0.0–0.2)

## 2021-02-28 LAB — BASIC METABOLIC PANEL
Anion gap: 8 (ref 5–15)
Anion gap: 6 (ref 5–15)
BUN: 22 mg/dL — ABNORMAL HIGH (ref 6–20)
BUN: 17 mg/dL (ref 6–20)
CO2: 31 mmol/L (ref 22–32)
CO2: 31 mmol/L (ref 22–32)
Calcium: 8 mg/dL — ABNORMAL LOW (ref 8.9–10.3)
Calcium: 7.6 mg/dL — ABNORMAL LOW (ref 8.9–10.3)
Chloride: 90 mmol/L — ABNORMAL LOW (ref 98–111)
Chloride: 92 mmol/L — ABNORMAL LOW (ref 98–111)
Creatinine, Ser: 1.19 mg/dL (ref 0.61–1.24)
Creatinine, Ser: 1.21 mg/dL (ref 0.61–1.24)
GFR, Estimated: >60 mL/min (ref >60)
GFR, Estimated: >60 mL/min (ref >60)
Glucose, Bld: 135 mg/dL — ABNORMAL HIGH (ref 70–99)
Glucose, Bld: 139 mg/dL — ABNORMAL HIGH (ref 70–99)
Potassium: 3.4 mmol/L — ABNORMAL LOW (ref 3.5–5.1)
Potassium: 4.1 mmol/L (ref 3.5–5.1)
Sodium: 129 mmol/L — ABNORMAL LOW (ref 135–145)
Sodium: 129 mmol/L — ABNORMAL LOW (ref 135–145)

## 2021-02-28 LAB — GLUCOSE, CAPILLARY
Glucose-Capillary: 188 mg/dL — ABNORMAL HIGH (ref 70–99)
Glucose-Capillary: 130 mg/dL — ABNORMAL HIGH (ref 70–99)
Glucose-Capillary: 145 mg/dL — ABNORMAL HIGH (ref 70–99)
Glucose-Capillary: 160 mg/dL — ABNORMAL HIGH (ref 70–99)

## 2021-02-28 MED ORDER — POTASSIUM CHLORIDE CRYS ER 20 MEQ PO TBCR
40.0000 meq | EXTENDED_RELEASE_TABLET | Freq: Every day | ORAL | Status: DC
  Administered 2021-03-01 – 2021-03-04 (×4): 40 meq via ORAL
  Filled 2021-02-28 (×4): qty 2

## 2021-02-28 MED ORDER — METOPROLOL SUCCINATE ER 25 MG PO TB24
12.5000 mg | ORAL_TABLET | Freq: Every day | ORAL | Status: DC
  Administered 2021-03-04 – 2021-03-08 (×4): 12.5 mg via ORAL
  Filled 2021-02-28 (×6): qty 1

## 2021-02-28 MED ORDER — FUROSEMIDE 10 MG/ML IJ SOLN
40.0000 mg | Freq: Two times a day (BID) | INTRAMUSCULAR | Status: DC
  Administered 2021-02-28 – 2021-03-02 (×6): 40 mg via INTRAVENOUS
  Filled 2021-02-28 (×6): qty 4

## 2021-02-28 MED ORDER — POTASSIUM CHLORIDE CRYS ER 20 MEQ PO TBCR
40.0000 meq | EXTENDED_RELEASE_TABLET | ORAL | Status: AC
  Administered 2021-02-28 (×2): 40 meq via ORAL
  Filled 2021-02-28 (×2): qty 2

## 2021-02-28 NOTE — Progress Notes (Signed)
PT Cancellation Note  Patient Details Name: Fernando Maddox MRN: [id number] DOB: [birth year]   Cancelled Treatment:    Reason Eval/Treat Not Completed: Medical issues which prohibited therapy per chart, patient has been having quite a bit of bleeding at TMA site the past couple days. Discussed with orthopedics team who recommended holding for today, also NWB to surgical LE in future PT sessions if possible. Will check back in tomorrow.    Madelaine Etienne, DPT, PN1   Supplemental Physical Therapist [facility]    Pager [phone] Acute Rehab Office [phone]

## 2021-02-28 NOTE — Progress Notes (Signed)
PROGRESS NOTE        PATIENT DETAILS Name: Fernando Maddox Age: 60 y.o. Sex: male Date of Birth: [birth year] Admit Date: 02/14/2021 Admitting Physician Charlott Holler, MD PCP:Van Domenic Schwab, MD  Brief Narrative: Patient is a 60 y.o. male with history of HFrEF, permanent atrial fibrillation, CAD, DM-2, known PAD-s/p partial amputation of right great toe presented to [facility] as a transfer from [facility] ED for septic shock in the setting of critical right lower extremity ischemia.  Significant events: 4/21>> transferred to [facility] from [facility]- found down-gangrene is right lower extremity-CT angio performed which showed concern for acute right arterial occlusion. 4/24>> transfer to [facility]. 5/3>> fall after getting out of bed-bleeding from TMA site-with wound dehiscence. 5/5>> bleeding from amputation stump  Significant studies: 4/21>> CT angio abdominal aorta with iliofemoral runoff: No opacification beyond mid SFA-suspicious for arterial embolism.  L1, L2, L3 and L4 vertebral bodies compression fractures.  Mildly enlarged left retroperitoneal, pelvic and inguinal lymph nodes. 4/21>> CT chest: No acute pulmonary disease. 4/21>> CT head: No acute intracranial abnormality 4/21>> CT C-spine: No acute fractures. 4/22>> chest x-ray: No active disease 4/22>> right lower extremity arterial duplex: No evidence of stenosis or occlusive disease. 4/22>> Echo: EF 20-25%, RV systolic function severely reduced  Antimicrobial therapy: Vancomycin: 4/21>>4/24 Zosyn: 4/21>> 4/24 Unasyn: 4/25>>4/28  Microbiology data: None  Procedures : 4/27>> left transmetatarsal amputation  Consults: Vascular surgery, PCCM, orthopedics, cardiology  DVT Prophylaxis : IV heparin  Subjective: No chest pain or shortness of breath-lying comfortably in bed.  Assessment/Plan: Septic shock due to critical left limb ischemia with left forefoot ulceration/gangrene s/p transmetatarsal amputation on 4/27:  Sepsis physiology has resolved-all antimicrobial therapy discontinued on 4/28.  Unfortunately got out of bed-noncompliant with weightbearing status and fell on 5/3 with resultant wound dehiscence and stump bleeding.  Left TMA stump bleeding with wound dehiscence: Occurred on 5/3-noncompliant with weightbearing status-bleeding reoccurred 5/5 AM-Xarelto on hold.  Orthopedics recommending supportive care and observation.  Acute metabolic encephalopathy: Due to above-found unresponsive at home-improved.  Acute on chronic systolic/diastolic heart failure/new RV failure: JVD remains elevated-no peripheral signs of edema-on IV Lasix.  Cardiology following.  Given poor social condition-lack of medical insight-not a ideal candidate for advanced CHF therapies.    Chronic atrial fibrillation with RVR: Rate controlled-previously on amiodarone-but stopped due to elevated LFTs.  Xarelto on hold due to bleeding from TMA stump site-we will hold for 24-48 hours before resuming.  Hyponatremia: Suspect due to CHF-continue fluid restriction-remains on IV Lasix with mild but improving hyponatremia  Hyperkalemia: Resolved  Transaminitis: Concern for either hepatic congestion or amiodarone induced hepatitis.  Discussed with cardiology on 4/29-stop amiodarone.  LFTs now downtrending-continue to follow-await hepatitis serology.  History of CAD: No anginal symptoms-not on aspirin as patient on anticoagulation  DM-2 (A1c 7.7 on 4/25): Monitor closely with SSI.  Follow CBGs closely.  Resume metformin on discharge.  Recent Labs    02/27/21 1648 02/27/21 2130 02/28/21 0737  GLUCAP 153* 104* 188*   Prior history of polysubstance use: UDS 3 years back positive for amphetamine/cannabinoids.  ?  OSA/central sleep apnea: Claims intolerant to CPAP-has not used it for the past several days.  Needs outpatient sleep study for formal diagnoses.  Goals of care: Noncompliant to medications-noncompliance to weightbearing  status-very poor insight into his medical issues-poor social economic conditions-probably has end-stage CHF-unfortunately his  overall long-term prognosis is very poor.  I do not think he is a candidate for aggressive therapies.  Evaluated by palliative care-DNR in place.  Will need ongoing goals of care discussion.  Nutrition Problem: Nutrition Problem: Moderate Malnutrition Etiology: chronic illness (CHF) Signs/Symptoms: mild muscle depletion,moderate muscle depletion,mild fat depletion,moderate fat depletion Interventions: MVI,Glucerna shake,Magic cup   Diet: Diet Order            Diet Carb Modified Fluid consistency: Thin; Room service appropriate? No; Fluid restriction: 1200 mL Fluid  Diet effective now                  Code Status: Full code   Family Communication: Called Father -[name]-updated over the phone on 5/5 Called mother- [name]-[phone]-updated over the phone on 4/29.  Disposition Plan: Status is: Inpatient  Remains inpatient appropriate because:Inpatient level of care appropriate due to severity of illness   Dispo: The patient is from: Home              Anticipated d/c is to: tbd              Patient currently is not medically stable to d/c.   Difficult to place patient No   Barriers to Discharge: Left leg gangrene-s/p transmetatarsal amputation-hyponatremia-likely will need SNF.   Antimicrobial agents: Anti-infectives (From admission, onward)   Start     Dose/Rate Route Frequency Ordered Stop   02/20/21 1100  ceFAZolin (ANCEF) IVPB 2g/100 mL premix        2 g 200 mL/hr over 30 Minutes Intravenous To Short Stay 02/20/21 0754 02/20/21 1254   02/18/21 1745  Ampicillin-Sulbactam (UNASYN) 3 g in sodium chloride 0.9 % 100 mL IVPB        3 g 200 mL/hr over 30 Minutes Intravenous Every 6 hours 02/18/21 1645 02/21/21 2359   02/18/21 1200  metroNIDAZOLE (FLAGYL) IVPB 500 mg  Status:  Discontinued        500 mg 100 mL/hr over 60  Minutes Intravenous Every 8 hours 02/18/21 1033 02/18/21 1043   02/18/21 1130  cefTRIAXone (ROCEPHIN) 2 g in sodium chloride 0.9 % 100 mL IVPB  Status:  Discontinued        2 g 200 mL/hr over 30 Minutes Intravenous Every 24 hours 02/18/21 1033 02/18/21 1043   02/15/21 0230  vancomycin (VANCOREADY) IVPB 1250 mg/250 mL  Status:  Discontinued        1,250 mg 166.7 mL/hr over 90 Minutes Intravenous Every 12 hours 02/15/21 0140 02/18/21 0735   02/15/21 0030  piperacillin-tazobactam (ZOSYN) IVPB 3.375 g  Status:  Discontinued        3.375 g 12.5 mL/hr over 240 Minutes Intravenous Every 8 hours 02/14/21 2338 02/18/21 1032       Time spent: 25 minutes-Greater than 50% of this time was spent in counseling, explanation of diagnosis, planning of further management, and coordination of care.  MEDICATIONS: Scheduled Meds: . vitamin C  1,000 mg Oral Daily  . Chlorhexidine Gluconate Cloth  6 each Topical Daily  . docusate sodium  100 mg Oral BID  . famotidine  20 mg Oral Daily  . feeding supplement (GLUCERNA SHAKE)  237 mL Oral BID BM  . furosemide  40 mg Intravenous BID  . insulin aspart  0-15 Units Subcutaneous TID WC  . insulin aspart  0-5 Units Subcutaneous QHS  . mouth rinse  15 mL Mouth Rinse BID  . metoprolol succinate  12.5 mg Oral Daily  . multivitamin with minerals  1  tablet Oral Daily  . pantoprazole  40 mg Oral Daily  . potassium chloride  40 mEq Oral Q4H  . [START ON 03/01/2021] potassium chloride  40 mEq Oral Daily  . zinc sulfate  220 mg Oral Daily   Continuous Infusions: . sodium chloride 10 mL/hr at 02/21/21 0810   PRN Meds:.acetaminophen **OR** acetaminophen, albuterol, alum & mag hydroxide-simeth, bisacodyl, guaiFENesin-dextromethorphan, magnesium citrate, ondansetron **OR** ondansetron (ZOFRAN) IV, oxyCODONE-acetaminophen, phenol, polyethylene glycol, zolpidem   PHYSICAL EXAM: Vital signs: Vitals:   02/28/21 0500 02/28/21 0531 02/28/21 0556 02/28/21 0741  BP: 91/70  102/72  97/68  Pulse: 77 75  (!) 108  Resp: 12 (!) 22  16  Temp:  98.3 F (36.8 C)  (!) 97.5 F (36.4 C)  TempSrc:  Oral  Oral  SpO2: 96% 94% 99% 92%  Weight: 97.3 kg     Height:       Filed Weights   02/26/21 0500 02/27/21 0740 02/28/21 0500  Weight: 102.7 kg 105.3 kg 97.3 kg   Body mass index is 26.81 kg/m.   Gen Exam:Alert awake-not in any distress HEENT:atraumatic, normocephalic Chest: B/L clear to auscultation anteriorly CVS:S1S2 regular Abdomen:soft non tender, non distended Extremities: Left TMA-dressing in place-nonbloody. Neurology: Non focal Skin: no rash  I have personally reviewed following labs and imaging studies  LABORATORY DATA: CBC: Recent Labs  Lab 02/22/21 0252 02/23/21 0037 02/26/21 2328 02/27/21 0701 02/28/21 0615  WBC 9.5 11.2*  --  6.4 5.3  HGB 11.9* 12.2* 11.2* 11.3* 10.3*  HCT 36.3* 36.2* 33.7* 33.7* 31.5*  MCV 84.0 82.1  --  83.0 84.7  PLT 130* 120*  --  168 173    Basic Metabolic Panel: Recent Labs  Lab 02/25/21 0017 02/26/21 0238 02/27/21 0701 02/27/21 1500 02/28/21 0615  NA 128* 125* 127* 130* 129*  K 4.9 4.4 3.6 3.7 3.4*  CL 95* 90* 92* 91* 90*  CO2 23 22 30 29 31   GLUCOSE 115* 119* 139* 132* 135*  BUN 29* 30* 24* 25* 22*  CREATININE 1.67* 1.42* 1.26* 1.33* 1.19  CALCIUM 8.2* 8.3* 8.1* 8.0* 8.0*    GFR: Estimated Creatinine Clearance: 79.9 mL/min (by C-G formula based on SCr of 1.19 mg/dL).  Liver Function Tests: Recent Labs  Lab 02/23/21 0037 02/24/21 0742 02/25/21 0017 02/26/21 0238 02/27/21 0701  AST 144* 94* 71* 46* 33  ALT 222* 169* 147* 120* 87*  ALKPHOS 101 96 112 120 102  BILITOT 2.2* 2.0* 1.9* 2.2* 2.5*  PROT 6.3* 6.0* 6.3* 6.8 6.0*  ALBUMIN 2.5* 2.3* 2.3* 2.4* 2.2*   No results for input(s): LIPASE, AMYLASE in the last 168 hours. No results for input(s): AMMONIA in the last 168 hours.  Coagulation Profile: No results for input(s): INR, PROTIME in the last 168 hours.  Cardiac Enzymes: No  results for input(s): CKTOTAL, CKMB, CKMBINDEX, TROPONINI in the last 168 hours.  BNP (last 3 results) No results for input(s): PROBNP in the last 8760 hours.  Lipid Profile: No results for input(s): CHOL, HDL, LDLCALC, TRIG, CHOLHDL, LDLDIRECT in the last 72 hours.  Thyroid Function Tests: No results for input(s): TSH, T4TOTAL, FREET4, T3FREE, THYROIDAB in the last 72 hours.  Anemia Panel: No results for input(s): VITAMINB12, FOLATE, FERRITIN, TIBC, IRON, RETICCTPCT in the last 72 hours.  Urine analysis: No results found for: COLORURINE, APPEARANCEUR, LABSPEC, PHURINE, GLUCOSEU, HGBUR, BILIRUBINUR, KETONESUR, PROTEINUR, UROBILINOGEN, NITRITE, LEUKOCYTESUR  Sepsis Labs: Lactic Acid, Venous    Component Value Date/Time   LATICACIDVEN 1.6 02/15/2021 0250  MICROBIOLOGY: Recent Results (from the past 240 hour(s))  SARS Coronavirus 2 by RT PCR (hospital order, performed in [facility] hospital lab) Nasopharyngeal Nasopharyngeal Swab     Status: None   Collection Time: 02/20/21  8:03 AM   Specimen: Nasopharyngeal Swab  Result Value Ref Range Status   SARS Coronavirus 2 NEGATIVE NEGATIVE Final    Comment: (NOTE) SARS-CoV-2 target nucleic acids are NOT DETECTED.  The SARS-CoV-2 RNA is generally detectable in upper and lower respiratory specimens during the acute phase of infection. The lowest concentration of SARS-CoV-2 viral copies this assay can detect is 250 copies / mL. A negative result does not preclude SARS-CoV-2 infection and should not be used as the sole basis for treatment or other patient management decisions.  A negative result may occur with improper specimen collection / handling, submission of specimen other than nasopharyngeal swab, presence of viral mutation(s) within the areas targeted by this assay, and inadequate number of viral copies (<250 copies / mL). A negative result must be combined with clinical observations, patient history, and epidemiological  information.  Fact Sheet for Patients:   BoilerBrush.com.cy  Fact Sheet for Healthcare Providers: https://pope.com/  This test is not yet approved or  cleared by the [place] FDA and has been authorized for detection and/or diagnosis of SARS-CoV-2 by FDA under an Emergency Use Authorization (EUA).  This EUA will remain in effect (meaning this test can be used) for the duration of the COVID-19 declaration under Section 564(b)(1) of the Act, 21 U.S.C. section 360bbb-3(b)(1), unless the authorization is terminated or revoked sooner.  Performed at [facility], [address] [place], [place] [address]   SARS CORONAVIRUS 2 (TAT 6-24 HRS) Nasopharyngeal Nasopharyngeal Swab     Status: None   Collection Time: 02/25/21 12:53 PM   Specimen: Nasopharyngeal Swab  Result Value Ref Range Status   SARS Coronavirus 2 NEGATIVE NEGATIVE Final    Comment: (NOTE) SARS-CoV-2 target nucleic acids are NOT DETECTED.  The SARS-CoV-2 RNA is generally detectable in upper and lower respiratory specimens during the acute phase of infection. Negative results do not preclude SARS-CoV-2 infection, do not rule out co-infections with other pathogens, and should not be used as the sole basis for treatment or other patient management decisions. Negative results must be combined with clinical observations, patient history, and epidemiological information. The expected result is Negative.  Fact Sheet for Patients: HairSlick.no  Fact Sheet for Healthcare Providers: quierodirigir.com  This test is not yet approved or cleared by the [place] FDA and  has been authorized for detection and/or diagnosis of SARS-CoV-2 by FDA under an Emergency Use Authorization (EUA). This EUA will remain  in effect (meaning this test can be used) for the duration of the COVID-19 declaration under Se ction  564(b)(1) of the Act, 21 U.S.C. section 360bbb-3(b)(1), unless the authorization is terminated or revoked sooner.  Performed at [facility], [address], [place], [place] [address]     RADIOLOGY STUDIES/RESULTS: No results found.   LOS: 14 days   Jeoffrey Massed, MD  [facility]    To contact the attending provider between 7A-7P or the covering provider during after hours 7P-7A, please log into the web site [email or web address] and access using universal [facility] password for that web site. If you do not have the password, please call the hospital operator.  02/28/2021, 11:33 AM

## 2021-02-28 NOTE — Progress Notes (Signed)
Dr Julian Reil order to do stat cbc, to hold lasxi and xarelto and need to inform the dr once v/s is worsening

## 2021-02-28 NOTE — Progress Notes (Addendum)
NP Precylia reported that there was bleeding on the left amputated toes, seen, last assessment done to the dressing was 0430H when given  biscuit to pt, asked pt what he did to the left foot and claimed he do not know, no  complaint of pain, soaked dressing was removed, and cleaned,  noticed oozing of blood underneath,  middle part of  the suture line/wound, manual pressure applied then with a  pressure dressing, called up Dr Julian Reil c/o RN Wynona Canes, informed the latest v/s and current medication of pt and approximated blood lost 100-150 ml of blood, RN Christine also referred pt  To Ortho DR. And as relayed will see pt in AM since bleeding was already controlled

## 2021-02-28 NOTE — Progress Notes (Addendum)
Asked to see patient for wound dehiscence.  He is status post transmetatarsal amputation.  Apparently he has been getting up at night not being compliant last evening he had a significant amount of bleeding per his nurse.  Vital signs stable he did not get tachycardic overnight he is comfortable lying in bed wound was examined he has not had any dehiscence some gaping there is some skin maceration on the plantar surface some dark areas on the dorsal surface which appear to be superficial.  Discussed with Dr. Lajoyce Corners  Will observe for now. Continue dressing changes. Will follow up 1 week in office

## 2021-02-28 NOTE — Progress Notes (Signed)
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 Pressure held. Ortho following.  Seen by palliative care. Made DNR. Overall poor prognosis.  Na 130-->129 Net negative 750cc; wt inaccurate  Inpatient Medications    Scheduled Meds: . vitamin C  1,000 mg Oral Daily  . Chlorhexidine Gluconate Cloth  6 each Topical Daily  . docusate sodium  100 mg Oral BID  . famotidine  20 mg Oral Daily  . feeding supplement (GLUCERNA SHAKE)  237 mL Oral BID BM  . furosemide  40 mg Intravenous BID  . insulin aspart  0-15 Units Subcutaneous TID WC  . insulin aspart  0-5 Units Subcutaneous QHS  . mouth rinse  15 mL Mouth Rinse BID  . multivitamin with minerals  1 tablet Oral Daily  . pantoprazole  40 mg Oral Daily  . zinc sulfate  220 mg Oral Daily   Continuous Infusions: . sodium chloride 10 mL/hr at 02/21/21 0810   PRN Meds: acetaminophen **OR** acetaminophen, albuterol, alum & mag hydroxide-simeth, bisacodyl, guaiFENesin-dextromethorphan, magnesium citrate, ondansetron **OR** ondansetron (ZOFRAN) IV, oxyCODONE-acetaminophen, phenol, polyethylene glycol, zolpidem   Vital Signs    Vitals:   02/28/21 0500 02/28/21 0531 02/28/21 0556 02/28/21 0741  BP: 91/70 102/72  97/68  Pulse: 77 75  (!) 108  Resp: 12 (!) 22  16  Temp:  98.3 F (36.8 C)  (!) 97.5 F (36.4 C)  TempSrc:  Oral  Oral  SpO2: 96% 94% 99% 92%  Weight: 97.3 kg     Height:        Intake/Output Summary (Last 24 hours) at 02/28/2021 0748 Last data filed at 02/28/2021 0600 Gross per 24 hour  Intake 550 ml  Output 1300 ml  Net -750 ml   Last 3 Weights 02/28/2021 02/27/2021 02/26/2021  Weight (lbs) 214 lb 8.1 oz 232 lb 2.3 oz 226 lb 6.6 oz  Weight (kg) 97.3 kg 105.3 kg 102.7 kg      Telemetry     Rate controlled Afib - Personally Reviewed  ECG    No new tracing - Personally Reviewed  Physical Exam   GEN: Laying in bed, awake Neck: No JVD Cardiac: Irregular, no murmurs  Respiratory: Faint bibasilar crackles GI: Soft, nontender, non-distended  MS: Left TMA site with dressing in place Neuro:  Nonfocal Psych: Intermittently confused  Labs    High Sensitivity Troponin:  No results for input(s): TROPONINIHS in the last 720 hours.    Chemistry Recent Labs  Lab 02/25/21 0017 02/26/21 0238 02/27/21 0701 02/27/21 1500  NA 128* 125* 127* 130*  K 4.9 4.4 3.6 3.7  CL 95* 90* 92* 91*  CO2 23 22 30 29   GLUCOSE 115* 119* 139* 132*  BUN 29* 30* 24* 25*  CREATININE 1.67* 1.42* 1.26* 1.33*  CALCIUM 8.2* 8.3* 8.1* 8.0*  PROT 6.3* 6.8 6.0*  --   ALBUMIN 2.3* 2.4* 2.2*  --   AST 71* 46* 33  --   ALT 147* 120* 87*  --   ALKPHOS 112 120 102  --   BILITOT 1.9* 2.2* 2.5*  --   GFRNONAA 47* 57* >60 >60  ANIONGAP 10 13 5 10      Hematology Recent Labs  Lab 02/22/21 0252 02/23/21 0037 02/26/21 2328 02/27/21 0701  WBC  9.5 11.2*  --  6.4  RBC 4.32 4.41  --  4.06*  HGB 11.9* 12.2* 11.2* 11.3*  HCT 36.3* 36.2* 33.7* 33.7*  MCV 84.0 82.1  --  83.0  MCH 27.5 27.7  --  27.8  MCHC 32.8 33.7  --  33.5  RDW 17.1* 17.7*  --  17.9*  PLT 130* 120*  --  168    BNP Recent Labs  Lab 02/26/21 1641  BNP 1,439.4*     DDimer No results for input(s): DDIMER in the last 168 hours.   Radiology    DG Chest Port 1 View  Result Date: 02/26/2021 CLINICAL DATA:  Shortness of breath EXAM: PORTABLE CHEST 1 VIEW COMPARISON:  02/18/2021 chest radiograph FINDINGS: Unchanged, enlarged cardiac silhouette. There is no new focal airspace disease. No pleural effusion or pneumothorax. No acute osseous abnormality. Old, healed left clavicle injury. Chronic bilateral rib fractures noted. IMPRESSION: Unchanged cardiomegaly.  No new focal airspace disease Electronically Signed   By: Caprice Renshaw   On:  02/26/2021 10:12    Cardiac Studies   Echo 02/15/21: 1. Left ventricular ejection fraction, by estimation, is 20 to 25%. The  left ventricle has severely decreased function. The left ventricle  demonstrates global hypokinesis. The left ventricular internal cavity size  was moderately dilated. Left  ventricular diastolic function could not be evaluated. There is the  interventricular septum is flattened in systole and diastole, consistent  with right ventricular pressure and volume overload.  2. Right ventricular systolic function is severely reduced. The right  ventricular size is moderately enlarged.  3. Left atrial size was moderately dilated.  4. Right atrial size was moderately dilated.  5. The mitral valve is normal in structure. Mild to moderate mitral valve  regurgitation.  6. Tricuspid valve regurgitation is mild to moderate.  7. The aortic valve is tricuspid. There is mild calcification of the  aortic valve. Aortic valve regurgitation is not visualized.  8. Aortic dilatation noted. There is borderline dilatation of the  ascending aorta, measuring 40 mm.  9. The inferior vena cava is dilated in size with <50% respiratory  variability, suggesting right atrial pressure of 15 mmHg.   Patient Profile     60 y.o. male with history ofCAD, permanent Afib, HFrEF, HTN, HLD, DMII, CVA, PAD with nonhealing foot wounds, OSA on CPAP, and tobacco abuse who initially presented to [facility] after being found down for 2 days. Was found to have LLE gangrene with concern for right arterial occlusion prompting transfer to [facility]. Formal vascular studies with normal ABI and negative LE dopplers for DVT. He was recommended for left transmetatarsal amputation. Cardiology was consulted on this admission for HFrEF and chronic Afib.Echocardiogram 4/22 showed EF of 20 to 25% with global hypokinesis,RV severely reduced, with moderate enlargement, LA/RAmoderately dilated,mild to moderate tricuspid  regurgitation.No LV thrombus noted.  Assessment & Plan    #Acute on chronic combined systolic and diastolic heart failure TTE with LVEF 20-25% with global hypokinesis. GDMT limited due to hypotension.Remains overloaded but volume status improving with increased diuresis. Currently with NYHA class III symptoms. -Continue lasix 40mg  IV BID -Will trend Na BID with diuresis -If Na worsens with diuresis despite fluid restriction, can consider dose of tolvaptan--tolerating so far -Start low dose metop 12.5mg  XL daily -Will add GDMT as tolerated -Low Na diet -Monitor I/Os and daily weights -Palliative consulted, overall poor prognosis and now DNR  #Permanent Atrial Fibrillation: CHADs-vasc 6. Not on rate controlling agents currently due to previous hypotension  and elevated LFTs. Overall rates controlled. Xarelto stopped overnight on 02/26/21 due to fall with bleeding from recent TMA site -Xarelto held due to bleeding from TMA surgical site after fall; resume once able -Start low dose metop  #Hyponatremia: Stable at 30-->129 now. -Diuresis as above -Monitor BMET BID -If Na worsens with diuresis despite fluid restriction, can do a dose of tolvaptan  #RV failure: #Transaminitis: -Diuresis as above -Transaminitis improving  #Unwittnessed Fall: Occurred on evening of 5/3. Had bleeding from TMA site. Xarelto held. -Management per primary and ortho -Xarelto held -H/H stable  #Septic shock: #LLE gangrene s/p left transmetatarsal amputation: #PAD: Slowly improving. S/p transmetatarsal amputation on 02/20/21 for LLE gangrene. -Management per ortho and primary  #Metabolic Encephalopathy: -Management per primary  #HLD: -Holding statin due to transaminitis  #DMII: -Management per primary  #CVA: -Not on ASA due to need for [facility] -Resume statin once LFT improve  #Tobacco Abuse: -Encourage cessation  #AKI Improving. -Diuresis as above      For questions or updates,  please contact [facility] Please consult [email or web address] for contact info under        Signed, Meriam Sprague, MD  02/28/2021, 7:48 AM

## 2021-02-28 NOTE — Progress Notes (Signed)
This chaplain responded to PMT referral for spiritual care.  The chaplain's parents-Rama and Reita Cliche are bedside.  The Pt. was enjoying his lunch when the chaplain arrived and introduced herself to the Pt. and family.  The chaplain understands the Pt. family arrived about five minutes before the chaplain and prefers time to visit with the Pt..    The chaplain extended an invitation for F/U spiritual care and exited the room.

## 2021-02-28 NOTE — Significant Event (Addendum)
Called by RN: Pt with bleeding to amputation stump again:  1) bleeding seems slowed / stopped for the moment after applying pressure dressing and gauze. 2) repeat CBC ordered for this morning 3) holding lasix for today given blood loss 4) stopping xarelto  No tachycardia.  SBP remains 90s-100s currently.  If any worsening of vital signs then RN to call back.  Also let RN know to notify on-call provider for Dr. Lajoyce Corners.

## 2021-02-28 NOTE — Progress Notes (Signed)
Progress Note  Patient Name: Fernando Maddox Date of Encounter: 03/01/2021  [facility] Cardiologist: Garwin Brothers, MD   Subjective   Complains of throbbing in his foot. Asking for pain meds. Breathing is overall improved.   Na stable at 130. Diuresing well. Net negative 3.1L Blood pressures remain soft   Inpatient Medications    Scheduled Meds: . vitamin C  1,000 mg Oral Daily  . Chlorhexidine Gluconate Cloth  6 each Topical Daily  . docusate sodium  100 mg Oral BID  . famotidine  20 mg Oral Daily  . feeding supplement (GLUCERNA SHAKE)  237 mL Oral BID BM  . furosemide  40 mg Intravenous BID  . insulin aspart  0-15 Units Subcutaneous TID WC  . insulin aspart  0-5 Units Subcutaneous QHS  . mouth rinse  15 mL Mouth Rinse BID  . metoprolol succinate  12.5 mg Oral Daily  . multivitamin with minerals  1 tablet Oral Daily  . pantoprazole  40 mg Oral Daily  . potassium chloride  40 mEq Oral Daily  . zinc sulfate  220 mg Oral Daily   Continuous Infusions: . sodium chloride 10 mL/hr at 02/21/21 0810   PRN Meds: acetaminophen **OR** acetaminophen, albuterol, alum & mag hydroxide-simeth, bisacodyl, guaiFENesin-dextromethorphan, magnesium citrate, ondansetron **OR** ondansetron (ZOFRAN) IV, oxyCODONE-acetaminophen, phenol, polyethylene glycol, zolpidem   Vital Signs    Vitals:   03/01/21 0015 03/01/21 0430 03/01/21 0451 03/01/21 0740  BP: 98/87 95/75    Pulse: 95 80  80  Resp: 19 18    Temp: 98.2 F (36.8 C) 97.8 F (36.6 C)    TempSrc: Oral Oral    SpO2: 98% 98%    Weight:   96.7 kg   Height:        Intake/Output Summary (Last 24 hours) at 03/01/2021 0820 Last data filed at 03/01/2021 0500 Gross per 24 hour  Intake 720 ml  Output 3800 ml  Net -3080 ml   Last 3 Weights 03/01/2021 02/28/2021 02/27/2021  Weight (lbs) 213 lb 3 oz 214 lb 8.1 oz 232 lb 2.3 oz  Weight (kg) 96.7 kg 97.3 kg 105.3 kg      Telemetry    Afib rate controlled - Personally Reviewed  ECG     No new tracing- Personally Reviewed  Physical Exam   GEN: No acute distress.   Neck: JVD mildly elevated Cardiac: Irregular, no murmurs Respiratory: Bibasilar crackles GI: Soft, nontender, non-distended  MS: LLE with dressing in place. Trace edema. Warm Neuro:  Nonfocal    Labs    High Sensitivity Troponin:  No results for input(s): TROPONINIHS in the last 720 hours.    Chemistry Recent Labs  Lab 02/26/21 0238 02/27/21 0701 02/27/21 1500 02/28/21 0615 02/28/21 1541 03/01/21 0458  NA 125* 127*   < > 129* 129* 130*  K 4.4 3.6   < > 3.4* 4.1 4.0  CL 90* 92*   < > 90* 92* 92*  CO2 22 30   < > 31 31 30   GLUCOSE 119* 139*   < > 135* 139* 117*  BUN 30* 24*   < > 22* 17 19  CREATININE 1.42* 1.26*   < > 1.19 1.21 1.12  CALCIUM 8.3* 8.1*   < > 8.0* 7.6* 8.2*  PROT 6.8 6.0*  --   --   --  6.3*  ALBUMIN 2.4* 2.2*  --   --   --  2.3*  AST 46* 33  --   --   --  31  ALT 120* 87*  --   --   --  64*  ALKPHOS 120 102  --   --   --  100  BILITOT 2.2* 2.5*  --   --   --  1.4*  GFRNONAA 57* >60   < > >60 >60 >60  ANIONGAP 13 5   < > 8 6 8    < > = values in this interval not displayed.     Hematology Recent Labs  Lab 02/27/21 0701 02/28/21 0615 03/01/21 0458  WBC 6.4 5.3 4.9  RBC 4.06* 3.72* 3.60*  HGB 11.3* 10.3* 10.0*  HCT 33.7* 31.5* 30.2*  MCV 83.0 84.7 83.9  MCH 27.8 27.7 27.8  MCHC 33.5 32.7 33.1  RDW 17.9* 18.0* 18.1*  PLT 168 173 194    BNP Recent Labs  Lab 02/26/21 1641  BNP 1,439.4*     DDimer No results for input(s): DDIMER in the last 168 hours.   Radiology    No results found.  Cardiac Studies   Echo 02/15/21: 1. Left ventricular ejection fraction, by estimation, is 20 to 25%. The  left ventricle has severely decreased function. The left ventricle  demonstrates global hypokinesis. The left ventricular internal cavity size  was moderately dilated. Left  ventricular diastolic function could not be evaluated. There is the  interventricular septum  is flattened in systole and diastole, consistent  with right ventricular pressure and volume overload.  2. Right ventricular systolic function is severely reduced. The right  ventricular size is moderately enlarged.  3. Left atrial size was moderately dilated.  4. Right atrial size was moderately dilated.  5. The mitral valve is normal in structure. Mild to moderate mitral valve  regurgitation.  6. Tricuspid valve regurgitation is mild to moderate.  7. The aortic valve is tricuspid. There is mild calcification of the  aortic valve. Aortic valve regurgitation is not visualized.  8. Aortic dilatation noted. There is borderline dilatation of the  ascending aorta, measuring 40 mm.  9. The inferior vena cava is dilated in size with <50% respiratory  variability, suggesting right atrial pressure of 15 mmHg.   Patient Profile     60 y.o. male with history ofCAD, permanent Afib, HFrEF, HTN, HLD, DMII, CVA, PAD with nonhealing foot wounds, OSA on CPAP, and tobacco abuse who initially presented to [place] after being found down for 2 days. Was found to have LLE gangrene with concern for right arterial occlusion prompting transfer to [facility]. Formal vascular studies with normal ABI and negative LE dopplers for DVT. He was recommended for left transmetatarsal amputation. Cardiology was consulted on this admission for HFrEF and chronic Afib.Echocardiogram 4/22 showed EF of 20 to 25% with global hypokinesis,RV severely reduced, with moderate enlargement, LA/RAmoderately dilated,mild to moderate tricuspid regurgitation.No LV thrombus noted.  Assessment & Plan    #Acute on chronic combined systolic and diastolic heart failure TTE with LVEF 20-25% with global hypokinesis. GDMT limited due to hypotension.Remains overloaded but volume status improving with increased diuresis. Currently with NYHA class III symptoms. -Continue lasix 40mg  IV BID; responding well but still overloaded (maybe transition  to PO over the weekend vs Monday) -Trend Na BID with diuresis -Low dose metop with holding parameters (only give if SBP>100) -Will add GDMT as tolerated -Low Na diet -Monitor I/Os and daily weights -Palliative consulted, overall poor prognosis and now DNR  #Permanent Atrial Fibrillation: CHADs-vasc 6.Not on rate controlling agents currently due to previous hypotension and elevated LFTs. Overall rates controlled. Xarelto  stopped overnight on 02/26/21 due to fall with bleeding from recent TMA site -Xarelto held due to bleeding from TMA surgical site after fall; resume once able -Low dose metop as above  #Hyponatremia: Stable at 30-->129 now. -Diuresis as above -Monitor BMET BID  #RV failure: #Transaminitis: -Diuresis as above  #Unwittnessed Fall: Occurred on evening of 5/3. Had bleeding from TMA site. Xarelto held. -Management per primary and ortho -Xarelto held; resume when able  #Septic shock: #LLE gangrene s/p left transmetatarsal amputation: #PAD: Slowly improving. S/p transmetatarsal amputation on 02/20/21 for LLE gangrene. -Management per ortho and primary  #Metabolic Encephalopathy: -Management per primary  #HLD: -Holding statin due to transaminitis  #DMII: -Management per primary  #CVA: -Not on ASA due to need for [facility] -Resume statin once LFT improve  #Tobacco Abuse: -Encourage cessation  #AKI Improving. -Diuresis as above      For questions or updates, please contact [facility] Please consult [email or web address] for contact info under        Signed, Meriam Sprague, MD  03/01/2021, 8:20 AM

## 2021-03-01 DIAGNOSIS — R6521 Severe sepsis with septic shock: Secondary | ICD-10-CM | POA: Diagnosis not present

## 2021-03-01 DIAGNOSIS — A419 Sepsis, unspecified organism: Secondary | ICD-10-CM | POA: Diagnosis not present

## 2021-03-01 DIAGNOSIS — L97524 Non-pressure chronic ulcer of other part of left foot with necrosis of bone: Secondary | ICD-10-CM | POA: Diagnosis not present

## 2021-03-01 LAB — FERRITIN: Ferritin: 69 ng/mL (ref 24–336)

## 2021-03-01 LAB — COMPREHENSIVE METABOLIC PANEL
ALT: 64 U/L — ABNORMAL HIGH (ref 0–44)
AST: 31 U/L (ref 15–41)
Albumin: 2.3 g/dL — ABNORMAL LOW (ref 3.5–5.0)
Alkaline Phosphatase: 100 U/L (ref 38–126)
Anion gap: 8 (ref 5–15)
BUN: 19 mg/dL (ref 6–20)
CO2: 30 mmol/L (ref 22–32)
Calcium: 8.2 mg/dL — ABNORMAL LOW (ref 8.9–10.3)
Chloride: 92 mmol/L — ABNORMAL LOW (ref 98–111)
Creatinine, Ser: 1.12 mg/dL (ref 0.61–1.24)
GFR, Estimated: >60 mL/min (ref >60)
Glucose, Bld: 117 mg/dL — ABNORMAL HIGH (ref 70–99)
Potassium: 4 mmol/L (ref 3.5–5.1)
Sodium: 130 mmol/L — ABNORMAL LOW (ref 135–145)
Total Bilirubin: 1.4 mg/dL — ABNORMAL HIGH (ref 0.3–1.2)
Total Protein: 6.3 g/dL — ABNORMAL LOW (ref 6.5–8.1)

## 2021-03-01 LAB — GLUCOSE, CAPILLARY
Glucose-Capillary: 105 mg/dL — ABNORMAL HIGH (ref 70–99)
Glucose-Capillary: 148 mg/dL — ABNORMAL HIGH (ref 70–99)
Glucose-Capillary: 150 mg/dL — ABNORMAL HIGH (ref 70–99)
Glucose-Capillary: 176 mg/dL — ABNORMAL HIGH (ref 70–99)

## 2021-03-01 LAB — CBC
HCT: 30.2 % — ABNORMAL LOW (ref 39.0–52.0)
Hemoglobin: 10 g/dL — ABNORMAL LOW (ref 13.0–17.0)
MCH: 27.8 pg (ref 26.0–34.0)
MCHC: 33.1 g/dL (ref 30.0–36.0)
MCV: 83.9 fL (ref 80.0–100.0)
Platelets: 194 10*3/uL (ref 150–400)
RBC: 3.6 MIL/uL — ABNORMAL LOW (ref 4.22–5.81)
RDW: 18.1 % — ABNORMAL HIGH (ref 11.5–15.5)
WBC: 4.9 10*3/uL (ref 4.0–10.5)
nRBC: 0 % (ref 0.0–0.2)

## 2021-03-01 LAB — IRON AND TIBC
Iron: 30 ug/dL — ABNORMAL LOW (ref 45–182)
Saturation Ratios: 9 % — ABNORMAL LOW (ref 17.9–39.5)
TIBC: 325 ug/dL (ref 250–450)
UIBC: 295 ug/dL

## 2021-03-01 MED ORDER — SODIUM CHLORIDE 0.9 % IV SOLN
250.0000 mg | Freq: Every day | INTRAVENOUS | Status: AC
  Administered 2021-03-01 – 2021-03-02 (×2): 250 mg via INTRAVENOUS
  Filled 2021-03-01 (×3): qty 20

## 2021-03-01 MED ORDER — MIDODRINE HCL 5 MG PO TABS
10.0000 mg | ORAL_TABLET | Freq: Three times a day (TID) | ORAL | Status: DC
  Administered 2021-03-01 – 2021-03-08 (×21): 10 mg via ORAL
  Filled 2021-03-01 (×23): qty 2

## 2021-03-01 NOTE — Progress Notes (Signed)
Nutrition Follow-up  DOCUMENTATION CODES:   Not applicable  INTERVENTION:   -Continue Glucerna Shake po BID, each supplement provides 220 kcal and 10 grams of protein -Continue Magic cup TID with meals, each supplement provides 290 kcal and 9 grams of protein -Continue MVI with minerals daily  NUTRITION DIAGNOSIS:   Moderate Malnutrition related to chronic illness (CHF) as evidenced by mild muscle depletion,moderate muscle depletion,mild fat depletion,moderate fat depletion.  Ongoing  GOAL:   Patient will meet greater than or equal to 90% of their needs  Progressing   MONITOR:   PO intake,Supplement acceptance,Labs,Skin  REASON FOR ASSESSMENT:   Consult Assessment of nutrition requirement/status  ASSESSMENT:   60 yo male admitted with unresponsiveness, gangrene RLE, septic shock. PMH includes DM-2, HTN, CVA, A fib, CHF, CAD, substance abuse.  4/27- s/p RLE transmetatarsal amputation 5/3- wound vac removed  Reviewed I/O's: -3.1 L x 24 hours and -8.8 L since 02/15/21  UOP" 3.8 L x 24 hours  Pt unavailable at time of visit. Attempted to speak with pt via call to hospital room phone, however, unable to reach.   Per chart review, pt with continued bl;eeding to stump site s/p fall.   Pt remains with good appetite. Noted meal completion 75-100%. Pt continues to receive Glucerna shakes and Magic Cups, which he is consuming.   Per therapy notes, plan to d/c to SNF once medically stable.  Medications reviewed and include colace, lasix, and zinc sulfate.  Labs reviewed: Na: 130, CBGS: 105-160 (inpatient orders for glycemic control are 0-15 units insulin aspart TID with meals and 0-5 units insulin aspart daily at bedtime).   Diet Order:   Diet Order            Diet Carb Modified Fluid consistency: Thin; Room service appropriate? No; Fluid restriction: 1200 mL Fluid  Diet effective now                 EDUCATION NEEDS:   Education needs have been  addressed  Skin:  Skin Assessment: Skin Integrity Issues: Skin Integrity Issues:: Other (Comment),DTI DTI: sacrum Other: L foot TMA site  Last BM:  02/28/21  Height:   Ht Readings from Last 1 Encounters:  02/18/21 6\' 3"  (1.905 m)    Weight:   Wt Readings from Last 1 Encounters:  03/01/21 96.7 kg    Ideal Body Weight:  89.1 kg  BMI:  Body mass index is 26.65 kg/m.  Estimated Nutritional Needs:   Kcal:  2500-2700  Protein:  140-160 gm  Fluid:  >/= 2.5 L    05/01/21, RD, LDN, CDCES Registered Dietitian II Certified Diabetes Care and Education Specialist Please refer to [facility] for RD and/or RD on-call/weekend/after hours pager

## 2021-03-01 NOTE — Progress Notes (Addendum)
Occupational Therapy Treatment Patient Details Name: Fernando Maddox MRN: [id number] DOB: 08-26-1961 Today's Date: 03/01/2021    History of present illness Fernando Maddox is a 60 y.o. male who presents with necrotic ulceration and purulent drainage from the left forefoot involving the great toe second toe and third toe.  Patient is unsure when this started. On 4/27, pt underwent L transmetatarsal amputation. 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 PMH: a fib, DM, GERD, CVA, drug abuse, HTN.   OT comments  Pt. Seen with PT for skilled treatment session.  Focus of session was bed mobility, sitting eob/sitting balance, seated grooming tasks and initiation of scoot toward hob in preparation for transfer to recliner.  Emphasis on nwb of lle during any mobility. (therapist asst. Holding LLE during all seated eob and scoot to ensure no weight bearing).   Grooming/bathing tasks min/mod a eob.  Session ended secondary to notable bleeding through bandage on LLE. rn notified immediately and assisted pt. Back to bed.  PT paged ortho per rn request to alert of the bleeding.     Follow Up Recommendations  SNF;Supervision/Assistance - 24 hour    Equipment Recommendations  3 in 1 bedside commode;Wheelchair (measurements OT);Wheelchair cushion (measurements OT);Other (comment)    Recommendations for Other Services      Precautions / Restrictions Precautions Precautions: Fall;Other (comment) Precaution Comments: very confused Restrictions LLE Weight Bearing: Non weight bearing       Mobility Bed Mobility Overal bed mobility: Needs Assistance Bed Mobility: Rolling;Sidelying to Sit;Sit to Supine Rolling: Min guard Sidelying to sit: Min guard   Sit to supine: Min assist   General bed mobility comments: pt. able to manage b les off of bed. max cues for hand placement and task initiation in/out of bed.  greater difficulty returning to bed then getting out of bed initially.     Transfers                      Balance                                           ADL either performed or assessed with clinical judgement   ADL Overall ADL's : Needs assistance/impaired     Grooming: Wash/dry hands;Wash/dry face;Sitting;Minimal assistance   Upper Body Bathing: Moderate assistance;Sitting Upper Body Bathing Details (indicate cue type and reason): chest, pt. had vomited and it was on chest as it had leaked through the gown     Upper Body Dressing : Set up;Sitting                     General ADL Comments: pt. non weight bearing through LLE, focused on scooting towards hob with use of b ues and rle in preparation to see if suitable for transfer to recliner.  pt. able to follow instructions to assist wtih grooming, bathing, and dressing tasks with max cues and redirection.  pt. would try some sort of hand gestures like sign language but when encouraged to speak and expalain he would.  unsure if it was due to confusion/word finding issues or when he needed to spit or vomit because that was an issue during session also.    session ended secondary to start up of bleeding through L dressing on foot. rn notified immediately.  PT paged and spoke with ortho  per rn request.     Vision       Perception     Praxis      Cognition Arousal/Alertness: Awake/alert Behavior During Therapy: Flat affect Overall Cognitive Status: Impaired/Different from baseline Area of Impairment: Attention;Following commands;Safety/judgement;Awareness;Problem solving;Orientation                 Orientation Level: Disoriented to;Time;Situation;Place Current Attention Level: Sustained   Following Commands: Follows one step commands inconsistently;Follows one step commands with increased time Safety/Judgement: Decreased awareness of safety;Decreased awareness of deficits Awareness: Intellectual Problem Solving: Slow processing;Decreased  initiation;Difficulty sequencing;Requires verbal cues;Requires tactile cues General Comments: inconsistent cue following- needs consistent simple multimodal cues and increased time, frequent redirection-was ablet to state where he was from, previous occupation and discuss his hobbies and things he liked doing.  would go from verbal communication to gesturing like sign language with max cues to speak.  then he would speak.  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 loved it and misses it very much.  favorite nascar driver was Forensic psychologist.)        Exercises     Shoulder Instructions       General Comments      Pertinent Vitals/ Pain       Pain Assessment: No/denies pain  Home Living                                          Prior Functioning/Environment              Frequency  Min 2X/week        Progress Toward Goals  OT Goals(current goals can now be found in the care plan section)  Progress towards OT goals: Progressing toward goals     Plan Discharge plan remains appropriate    Co-evaluation      Reason for Co-Treatment: For patient/therapist safety;To address functional/ADL transfers PT goals addressed during session: Mobility/safety with mobility        AM-PAC OT "6 Clicks" Daily Activity     Outcome Measure   Help from another person eating meals?: A Little Help from another person taking care of personal grooming?: A Little Help from another person toileting, which includes using toliet, bedpan, or urinal?: Total Help from another person bathing (including washing, rinsing, drying)?: A Lot Help from another person to put on and taking off regular upper body clothing?: A Lot Help from another person to put on and taking off regular lower body clothing?: Total 6 Click Score: 12    End of Session    OT Visit Diagnosis: Unsteadiness on feet  (R26.81);Other abnormalities of gait and mobility (R26.89);Muscle weakness (generalized) (M62.81);Other symptoms and signs involving cognitive function   Activity Tolerance Other (comment) (session limited secondary to sudden onset of bleeding LLE)   Patient Left     Nurse Communication Other (comment) (notified of sudden bleeding LLE, and that pt. Was vomiting upon arrival and was now vomiting at end of session.  PT pages and spoke with ortho per rn request)        Time: 3545-6256 OT Time Calculation (min): 23 min  Charges: OT General Charges $OT Visit: 1 Visit OT Treatments $Self Care/Home Management : 8-22 mins  Boneta Lucks, COTA/L Acute Rehabilitation [phone]  03/01/2021, 1:15 PM

## 2021-03-01 NOTE — Progress Notes (Addendum)
PROGRESS NOTE        PATIENT DETAILS Name: Fernando Maddox Age: 60 y.o. Sex: male Date of Birth: 10/24/1961 Admit Date: 02/14/2021 Admitting Physician Charlott Holler, MD PCP:Van Domenic Schwab, MD  Brief Narrative: Patient is a 60 y.o. male with history of HFrEF, permanent atrial fibrillation, CAD, DM-2, known PAD-s/p partial amputation of right great toe presented to [facility] as a transfer from [facility] ED for septic shock in the setting of critical right lower extremity ischemia.  Significant events: 4/21>> transferred to [facility] from [facility]- found down-gangrene is right lower extremity-CT angio performed which showed concern for acute right arterial occlusion. 4/24>> transfer to [facility]. 5/3>> fall after getting out of bed-bleeding from TMA site-with wound dehiscence. 5/5>> bleeding from amputation stump 5/6>> recurrent bleeding from amputation stump-stopped with compressive dressings.  Significant studies: 4/21>> CT angio abdominal aorta with iliofemoral runoff: No opacification beyond mid SFA-suspicious for arterial embolism.  L1, L2, L3 and L4 vertebral bodies compression fractures.  Mildly enlarged left retroperitoneal, pelvic and inguinal lymph nodes. 4/21>> CT chest: No acute pulmonary disease. 4/21>> CT head: No acute intracranial abnormality 4/21>> CT C-spine: No acute fractures. 4/22>> chest x-ray: No active disease 4/22>> right lower extremity arterial duplex: No evidence of stenosis or occlusive disease. 4/22>> Echo: EF 20-25%, RV systolic function severely reduced  Antimicrobial therapy: Vancomycin: 4/21>>4/24 Zosyn: 4/21>> 4/24 Unasyn: 4/25>>4/28  Microbiology data: None  Procedures : 4/27>> left transmetatarsal amputation  Consults: Vascular surgery, PCCM, orthopedics, cardiology  DVT Prophylaxis : IV heparin  Subjective: No significant overnight events. Sitting up in bed, starting to eat breakfast this morning. Responding more appropriately to  questions compared to a couple days ago. Denies any complaints. He feels like his breathing is better.  Assessment/Plan: Septic shock due to critical left limb ischemia with left forefoot ulceration/gangrene s/p transmetatarsal amputation on 4/27: Sepsis physiology has resolved-all antimicrobial therapy discontinued on 4/28.   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  Continues to have intermittent bleeding from his left foot stump-Xarelto remains on hold.  Orthopedics following with recommendations to continue supportive care.    Acute metabolic encephalopathy: Due to above-found unresponsive at home-improved.  Iron deficiency anemia. Likely from stump bleeding as well as a component of chronic disease. hgb stable from yesterday.IV iron ordered today  Acute on chronic systolic/diastolic heart failure/new RV failure:  Remains volume overloaded but having good UOP with IV lasix.  -diuresis per cardiology -metoprolol ordered with hold parameters (SBP <100) -pressures precluding GDMT  Chronic atrial fibrillation with RVR. CHADSVASC 6 = 9.7% stroke risk per year. HAS-BLED score indicates high risk for bleeding. Rate controlled-previously on amiodarone-but stopped due to elevated LFTs.  Xarelto on hold due to bleeding from TMA stump site -consider restarting xarelto in the AM if he has no more major bleeding  Hyponatremia: Suspect due to CHF-continue fluid restriction-remains on IV Lasix with mild but improving hyponatremia  Hyperkalemia: Resolved  Transaminitis: Concern for either hepatic congestion or amiodarone induced hepatitis.  Discussed with cardiology on 4/29-stop amiodarone.  LFTs now downtrending-continue to follow-await hepatitis serology.  History of CAD: No anginal symptoms-not on aspirin as patient on anticoagulation  DM-2 (A1c 7.7 on 4/25): CBGs at goal.  Continue SSI. Resume metformin on discharge.  Prior history of polysubstance  use: UDS 3 years back positive for amphetamine/cannabinoids.  ?  OSA/central sleep apnea: Claims intolerant to CPAP-has not used it for the past several days.  Needs outpatient sleep study for formal diagnoses.  Goals of care: Noncompliant to medications-noncompliance to weightbearing status-very poor insight into his medical issues-poor social economic conditions-probably has end-stage CHF-unfortunately his overall long-term prognosis is very poor.  I do not think he is a candidate for aggressive therapies.  Evaluated by palliative care-DNR in place.  Will need ongoing goals of care discussion.  Nutrition Problem: Nutrition Problem: Moderate Malnutrition Etiology: chronic illness (CHF) Signs/Symptoms: mild muscle depletion,moderate muscle depletion,mild fat depletion,moderate fat depletion Interventions: MVI,Glucerna shake,Magic cup   Diet: Diet Order            Diet Carb Modified Fluid consistency: Thin; Room service appropriate? No; Fluid restriction: 1200 mL Fluid  Diet effective now                  Code Status: Full code   Family Communication: Called Father -[name]-updated over the phone on 5/5 Called mother- [name]-[phone]-updated over the phone on 4/29.  Disposition Plan: Status is: Inpatient  Remains inpatient appropriate because:Inpatient level of care appropriate due to severity of illness   Dispo: The patient is from: Home              Anticipated d/c is to: tbd              Patient currently is not medically stable to d/c.   Difficult to place patient No   Barriers to Discharge: ongoing diuresis   Antimicrobial agents: Anti-infectives (From admission, onward)   Start     Dose/Rate Route Frequency Ordered Stop   02/20/21 1100  ceFAZolin (ANCEF) IVPB 2g/100 mL premix        2 g 200 mL/hr over 30 Minutes Intravenous To Short Stay 02/20/21 0754  02/20/21 1254   02/18/21 1745  Ampicillin-Sulbactam (UNASYN) 3 g in sodium chloride 0.9 % 100 mL IVPB        3 g 200 mL/hr over 30 Minutes Intravenous Every 6 hours 02/18/21 1645 02/21/21 2359   02/18/21 1200  metroNIDAZOLE (FLAGYL) IVPB 500 mg  Status:  Discontinued        500 mg 100 mL/hr over 60 Minutes Intravenous Every 8 hours 02/18/21 1033 02/18/21 1043   02/18/21 1130  cefTRIAXone (ROCEPHIN) 2 g in sodium chloride 0.9 % 100 mL IVPB  Status:  Discontinued        2 g 200 mL/hr over 30 Minutes Intravenous Every 24 hours 02/18/21 1033 02/18/21 1043   02/15/21 0230  vancomycin (VANCOREADY) IVPB 1250 mg/250 mL  Status:  Discontinued        1,250 mg 166.7 mL/hr over 90 Minutes Intravenous Every 12 hours 02/15/21 0140 02/18/21 0735   02/15/21 0030  piperacillin-tazobactam (ZOSYN) IVPB 3.375 g  Status:  Discontinued        3.375 g 12.5 mL/hr over 240 Minutes Intravenous Every 8 hours 02/14/21 2338 02/18/21 1032      MEDICATIONS: Scheduled Meds: . vitamin C  1,000 mg Oral Daily  . Chlorhexidine Gluconate Cloth  6 each Topical Daily  . docusate sodium  100 mg Oral BID  . famotidine  20 mg Oral Daily  . feeding supplement (GLUCERNA SHAKE)  237 mL Oral BID BM  . furosemide  40 mg Intravenous BID  . insulin aspart  0-15 Units Subcutaneous TID WC  . insulin aspart  0-5 Units Subcutaneous QHS  .  mouth rinse  15 mL Mouth Rinse BID  . metoprolol succinate  12.5 mg Oral Daily  . multivitamin with minerals  1 tablet Oral Daily  . pantoprazole  40 mg Oral Daily  . potassium chloride  40 mEq Oral Daily  . zinc sulfate  220 mg Oral Daily   Continuous Infusions: . sodium chloride 10 mL/hr at 02/21/21 0810   PRN Meds:.acetaminophen **OR** acetaminophen, albuterol, alum & mag hydroxide-simeth, bisacodyl, guaiFENesin-dextromethorphan, magnesium citrate, ondansetron **OR** ondansetron (ZOFRAN) IV, oxyCODONE-acetaminophen, phenol, polyethylene glycol, zolpidem   PHYSICAL EXAM: Vital  signs: Vitals:   02/28/21 2000 03/01/21 0015 03/01/21 0430 03/01/21 0451  BP: 92/72 98/87 95/75    Pulse: 97 95 80   Resp: 18 19 18    Temp: 98.2 F (36.8 C) 98.2 F (36.8 C) 97.8 F (36.6 C)   TempSrc: Oral Oral Oral   SpO2: 94% 98% 98%   Weight:    96.7 kg  Height:       Filed Weights   02/27/21 0740 02/28/21 0500 03/01/21 0451  Weight: 105.3 kg 97.3 kg 96.7 kg   Body mass index is 26.65 kg/m.   General: chronically ill appearing Cardiac: RRR, elevated JVD, trace LE edema Pulm: breathing comfortably on room air. Air movement significantly improved  Skin: compression wrap to the LLE, clean and intact Neuro: alert, oriented to person and place. Mentation overall seems improved from 5/4  I have personally reviewed following labs and imaging studies  LABORATORY DATA: CBC: Recent Labs  Lab 02/23/21 0037 02/26/21 2328 02/27/21 0701 02/28/21 0615 03/01/21 0458  WBC 11.2*  --  6.4 5.3 4.9  HGB 12.2* 11.2* 11.3* 10.3* 10.0*  HCT 36.2* 33.7* 33.7* 31.5* 30.2*  MCV 82.1  --  83.0 84.7 83.9  PLT 120*  --  168 173 194    Basic Metabolic Panel: Recent Labs  Lab 02/27/21 0701 02/27/21 1500 02/28/21 0615 02/28/21 1541 03/01/21 0458  NA 127* 130* 129* 129* 130*  K 3.6 3.7 3.4* 4.1 4.0  CL 92* 91* 90* 92* 92*  CO2 30 29 31 31 30   GLUCOSE 139* 132* 135* 139* 117*  BUN 24* 25* 22* 17 19  CREATININE 1.26* 1.33* 1.19 1.21 1.12  CALCIUM 8.1* 8.0* 8.0* 7.6* 8.2*    GFR: Estimated Creatinine Clearance: 84.9 mL/min (by C-G formula based on SCr of 1.12 mg/dL).  Liver Function Tests: Recent Labs  Lab 02/24/21 0742 02/25/21 0017 02/26/21 0238 02/27/21 0701 03/01/21 0458  AST 94* 71* 46* 33 31  ALT 169* 147* 120* 87* 64*  ALKPHOS 96 112 120 102 100  BILITOT 2.0* 1.9* 2.2* 2.5* 1.4*  PROT 6.0* 6.3* 6.8 6.0* 6.3*  ALBUMIN 2.3* 2.3* 2.4* 2.2* 2.3*   No results for input(s): LIPASE, AMYLASE in the last 168 hours. No results for input(s): AMMONIA in the last 168  hours.  Coagulation Profile: No results for input(s): INR, PROTIME in the last 168 hours.  Cardiac Enzymes: No results for input(s): CKTOTAL, CKMB, CKMBINDEX, TROPONINI in the last 168 hours.  BNP (last 3 results) No results for input(s): PROBNP in the last 8760 hours.  Lipid Profile: No results for input(s): CHOL, HDL, LDLCALC, TRIG, CHOLHDL, LDLDIRECT in the last 72 hours.  Thyroid Function Tests: No results for input(s): TSH, T4TOTAL, FREET4, T3FREE, THYROIDAB in the last 72 hours.  Anemia Panel: No results for input(s): VITAMINB12, FOLATE, FERRITIN, TIBC, IRON, RETICCTPCT in the last 72 hours.  Urine analysis: No results found for: COLORURINE, APPEARANCEUR, LABSPEC, PHURINE, GLUCOSEU, HGBUR, BILIRUBINUR,  KETONESUR, PROTEINUR, UROBILINOGEN, NITRITE, LEUKOCYTESUR  Sepsis Labs: Lactic Acid, Venous    Component Value Date/Time   LATICACIDVEN 1.6 02/15/2021 0250    MICROBIOLOGY: Recent Results (from the past 240 hour(s))  SARS Coronavirus 2 by RT PCR (hospital order, performed in [facility] hospital lab) Nasopharyngeal Nasopharyngeal Swab     Status: None   Collection Time: 02/20/21  8:03 AM   Specimen: Nasopharyngeal Swab  Result Value Ref Range Status   SARS Coronavirus 2 NEGATIVE NEGATIVE Final    Comment: (NOTE) SARS-CoV-2 target nucleic acids are NOT DETECTED.  The SARS-CoV-2 RNA is generally detectable in upper and lower respiratory specimens during the acute phase of infection. The lowest concentration of SARS-CoV-2 viral copies this assay can detect is 250 copies / mL. A negative result does not preclude SARS-CoV-2 infection and should not be used as the sole basis for treatment or other patient management decisions.  A negative result may occur with improper specimen collection / handling, submission of specimen other than nasopharyngeal swab, presence of viral mutation(s) within the areas targeted by this assay, and inadequate number of viral copies (<250  copies / mL). A negative result must be combined with clinical observations, patient history, and epidemiological information.  Fact Sheet for Patients:   BoilerBrush.com.cy  Fact Sheet for Healthcare Providers: https://pope.com/  This test is not yet approved or  cleared by the [place] FDA and has been authorized for detection and/or diagnosis of SARS-CoV-2 by FDA under an Emergency Use Authorization (EUA).  This EUA will remain in effect (meaning this test can be used) for the duration of the COVID-19 declaration under Section 564(b)(1) of the Act, 21 U.S.C. section 360bbb-3(b)(1), unless the authorization is terminated or revoked sooner.  Performed at [facility], [address] [place], [place] [address]   SARS CORONAVIRUS 2 (TAT 6-24 HRS) Nasopharyngeal Nasopharyngeal Swab     Status: None   Collection Time: 02/25/21 12:53 PM   Specimen: Nasopharyngeal Swab  Result Value Ref Range Status   SARS Coronavirus 2 NEGATIVE NEGATIVE Final    Comment: (NOTE) SARS-CoV-2 target nucleic acids are NOT DETECTED.  The SARS-CoV-2 RNA is generally detectable in upper and lower respiratory specimens during the acute phase of infection. Negative results do not preclude SARS-CoV-2 infection, do not rule out co-infections with other pathogens, and should not be used as the sole basis for treatment or other patient management decisions. Negative results must be combined with clinical observations, patient history, and epidemiological information. The expected result is Negative.  Fact Sheet for Patients: HairSlick.no  Fact Sheet for Healthcare Providers: quierodirigir.com  This test is not yet approved or cleared by the [place] FDA and  has been authorized for detection and/or diagnosis of SARS-CoV-2 by FDA under an Emergency Use Authorization (EUA). This EUA will  remain  in effect (meaning this test can be used) for the duration of the COVID-19 declaration under Se ction 564(b)(1) of the Act, 21 U.S.C. section 360bbb-3(b)(1), unless the authorization is terminated or revoked sooner.  Performed at [facility], [address], [place], [place] [address]     RADIOLOGY STUDIES/RESULTS: No results found.   LOS: 15 days   Rylee Ephriam Knuckles, MD  [facility]  To contact the attending provider between 7A-7P or the covering provider during after hours 7P-7A, please log into the web site [email or web address] and access using universal [place] password for that web site. If you do not have the password, please call the  hospital operator.  03/01/2021, 7:26 AM   Attending MD: Patient was seen and examined-with resident MD-above-noted plan was formulated in conjunction with resident MD.  Subjective: Has another episode of bleeding from his left foot stump-stopped with compressive dressings.  Nursing staff informed orthopedic PA-C.  Objective: Blood pressure (!) 83/55, pulse 80, temperature 97.8 F (36.6 C), temperature source Oral, resp. rate 16, height 6\' 3"  (1.905 m), weight 96.7 kg, SpO2 98 %.  Chest: Bilaterally clear to auscultation CVS: S1-S2 regular Abdomen: Soft nontender nondistended Extremities: Left TMA-dressing in place. Neurology: None focal.  Assessment and plan Decompensated systolic heart failure: Volume status improving-continue IV Lasix  Hyponatremia: Due to hypervolemia-improving with diuretics.  Left lower extremity gangrene-s/p left TMA-now with wound dehiscence and bleeding: Continue with compressive dressing-await orthopedics input.  Xarelto on hold.  Monitor CBC.  Rest as above.

## 2021-03-01 NOTE — Progress Notes (Signed)
Ok to give Ferrlecit 250mg  IV qday x2 per Dr. .  Ephriam Knuckles, PharmD, BCIDP, AAHIVP, CPP Infectious Disease Pharmacist 03/01/2021 9:58 AM

## 2021-03-01 NOTE — Progress Notes (Signed)
I was called into physical therapy. Pt's LLE was gushing blood through the drsg. I removed the drsg and blood was squirting out of the wound on the Left outer portion. I applied pressure, Dr. Reece Agar notified and PT notified Ortho. After applying pressure for approx 15 min, I reapplied guaze, abd pads, kerlix and ace wrap with some pressure. Foot was elevated on several pillows. VS monitored. Will cont to monitor pt for any s/s of bleeding or adverse reactions to bleeding.

## 2021-03-01 NOTE — Progress Notes (Signed)
Physical Therapy Treatment Patient Details Name: JAVIUS SYLLA MRN: [id number] DOB: 08/22/1961 Today's Date: 03/01/2021    History of Present Illness JANIE STROTHMAN is a 60 y.o. male who presents with necrotic ulceration and purulent drainage from the left forefoot involving the great toe second toe and third toe.  Patient is unsure when this started. On 4/27, pt underwent L transmetatarsal amputation. 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 PMH: a fib, DM, GERD, CVA, drug abuse, HTN.    PT Comments    Pt cleared to participate by Silvestre Gunner, ortho PA-C. Patient received in bed, cooperative with therapy but needed frequent redirection as well as multimodal cuing for all tasks today. Oriented to self only, but with cuing and encouragement to physically talk was able to tell us about where he is from/what he used to do. Now NWB on surgical limb, so practiced scooting along edge of bed with L LE NWB (enforced by therapist physically holding limb off ground)- able to scoot well however while scooting his TMA site began to bleed copiously. We immediately returned him to bed and got assistance from nursing staff. Updated ortho PA about pt status/bleeding. Left in bed with all needs met, nursing staff assisting patient. Will continue to follow.   Follow Up Recommendations  SNF     Equipment Recommendations  Rolling walker with 5" wheels;Wheelchair (measurements PT);Wheelchair cushion (measurements PT);3in1 (PT) (drop arm commode)    Recommendations for Other Services       Precautions / Restrictions Precautions Precautions: Fall;Other (comment) Precaution Comments: very confused, TMA site bleeds VERY easily Restrictions Weight Bearing Restrictions: Yes LLE Weight Bearing: Non weight bearing LLE Partial Weight Bearing Percentage or Pounds: now NWB L LE (as of 02/28/21)    Mobility  Bed Mobility Overal bed mobility: Needs Assistance Bed Mobility:  Rolling;Sidelying to Sit;Sit to Supine Rolling: Min guard Sidelying to sit: Min guard   Sit to supine: Min assist   General bed mobility comments: pt. able to manage b les off of bed. max cues for hand placement and task initiation in/out of bed.  greater difficulty returning to bed then getting out of bed initially.    Transfers                 General transfer comment: deferred- bleeding from TMA site  Ambulation/Gait             General Gait Details: unable- bleeding from TMA site   Stairs             Wheelchair Mobility    Modified Rankin (Stroke Patients Only)       Balance Overall balance assessment: Needs assistance Sitting-balance support: Feet supported;Bilateral upper extremity supported Sitting balance-Leahy Scale: Fair                                      Cognition Arousal/Alertness: Awake/alert Behavior During Therapy: Flat affect Overall Cognitive Status: Impaired/Different from baseline Area of Impairment: Attention;Following commands;Safety/judgement;Awareness;Problem solving;Orientation                 Orientation Level: Disoriented to;Time;Situation;Place Current Attention Level: Sustained   Following Commands: Follows one step commands inconsistently;Follows one step commands with increased time Safety/Judgement: Decreased awareness of safety;Decreased awareness of deficits Awareness: Intellectual Problem Solving: Slow processing;Decreased initiation;Difficulty sequencing;Requires verbal cues;Requires tactile cues General Comments: inconsistent cue following- needs consistent simple multimodal cues  and increased time, frequent redirection-was ablet to state where he was from, previous occupation and discuss his hobbies and things he liked doing.  would go from verbal communication to gesturing like sign language with max cues to speak.  then he would speak.  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. loved it and misses it very much.  favorite nascar driver was Geographical information systems officer.)      Exercises      General Comments        Pertinent Vitals/Pain Pain Assessment: No/denies pain Pain Intervention(s): Limited activity within patient's tolerance;Monitored during session    Home Living                      Prior Function            PT Goals (current goals can now be found in the care plan section) Acute Rehab PT Goals Patient Stated Goal: none stated PT Goal Formulation: Patient unable to participate in goal setting Time For Goal Achievement: 03/07/21 Potential to Achieve Goals: Good Progress towards PT goals: Progressing toward goals    Frequency    Min 2X/week      PT Plan Frequency needs to be updated;Equipment recommendations need to be updated    Co-evaluation PT/OT/SLP Co-Evaluation/Treatment: Yes Reason for Co-Treatment: For patient/therapist safety;To address functional/ADL transfers PT goals addressed during session: Mobility/safety with mobility        AM-PAC PT "6 Clicks" Mobility   Outcome Measure  Help needed turning from your back to your side while in a flat bed without using bedrails?: A Little Help needed moving from lying on your back to sitting on the side of a flat bed without using bedrails?: A Little Help needed moving to and from a bed to a chair (including a wheelchair)?: Total Help needed standing up from a chair using your arms (e.g., wheelchair or bedside chair)?: Total Help needed to walk in hospital room?: Total Help needed climbing 3-5 steps with a railing? : Total 6 Click Score: 10    End of Session   Activity Tolerance: Other (comment) (limited by acute copious  bleeding TMA site) Patient left: in bed;with call bell/phone within reach;with bed alarm set;Other (comment) (nursing staff present and addressing TMA site/bleeding) Nurse  Communication: Mobility status PT Visit Diagnosis: Other abnormalities of gait and mobility (R26.89);Unsteadiness on feet (R26.81);Difficulty in walking, not elsewhere classified (R26.2)     Time: 3967-2897 PT Time Calculation (min) (ACUTE ONLY): 23 min  Charges:  $Therapeutic Activity: 8-22 mins (co-tx with OT)                     Windell Norfolk, DPT, PN1   Supplemental Physical Therapist [place]    Pager [phone] Acute Rehab Office [phone]

## 2021-03-01 NOTE — Progress Notes (Signed)
Pharmacist Heart Failure Core Measure Documentation  Assessment: Fernando Maddox has an EF documented as 20-25% on 02/15/21 by ECHO.  Rationale: Heart failure patients with left ventricular systolic dysfunction (LVSD) and an EF < 40% should be prescribed an angiotensin converting enzyme inhibitor (ACEI) or angiotensin receptor blocker (ARB) at discharge unless a contraindication is documented in the medical record.  This patient is not currently on an ACEI or ARB for HF.  This note is being placed in the record in order to provide documentation that a contraindication to the use of these agents is present for this encounter.  ACE Inhibitor or Angiotensin Receptor Blocker is contraindicated (specify all that apply)  []   ACEI allergy AND ARB allergy []   Angioedema []   Moderate or severe aortic stenosis []   Hyperkalemia [x]   Hypotension []   Renal artery stenosis []   Worsening renal function, preexisting renal disease or dysfunction

## 2021-03-02 DIAGNOSIS — R6521 Severe sepsis with septic shock: Secondary | ICD-10-CM | POA: Diagnosis not present

## 2021-03-02 DIAGNOSIS — L97524 Non-pressure chronic ulcer of other part of left foot with necrosis of bone: Secondary | ICD-10-CM | POA: Diagnosis not present

## 2021-03-02 DIAGNOSIS — A419 Sepsis, unspecified organism: Secondary | ICD-10-CM | POA: Diagnosis not present

## 2021-03-02 LAB — BASIC METABOLIC PANEL
Anion gap: 8 (ref 5–15)
BUN: 19 mg/dL (ref 6–20)
CO2: 31 mmol/L (ref 22–32)
Calcium: 8 mg/dL — ABNORMAL LOW (ref 8.9–10.3)
Chloride: 90 mmol/L — ABNORMAL LOW (ref 98–111)
Creatinine, Ser: 1.16 mg/dL (ref 0.61–1.24)
GFR, Estimated: >60 mL/min (ref >60)
Glucose, Bld: 134 mg/dL — ABNORMAL HIGH (ref 70–99)
Potassium: 4.1 mmol/L (ref 3.5–5.1)
Sodium: 129 mmol/L — ABNORMAL LOW (ref 135–145)

## 2021-03-02 LAB — CBC
HCT: 29.6 % — ABNORMAL LOW (ref 39.0–52.0)
Hemoglobin: 9.5 g/dL — ABNORMAL LOW (ref 13.0–17.0)
MCH: 27.5 pg (ref 26.0–34.0)
MCHC: 32.1 g/dL (ref 30.0–36.0)
MCV: 85.5 fL (ref 80.0–100.0)
Platelets: 204 10*3/uL (ref 150–400)
RBC: 3.46 MIL/uL — ABNORMAL LOW (ref 4.22–5.81)
RDW: 18.4 % — ABNORMAL HIGH (ref 11.5–15.5)
WBC: 5.7 10*3/uL (ref 4.0–10.5)
nRBC: 0 % (ref 0.0–0.2)

## 2021-03-02 LAB — GLUCOSE, CAPILLARY
Glucose-Capillary: 151 mg/dL — ABNORMAL HIGH (ref 70–99)
Glucose-Capillary: 134 mg/dL — ABNORMAL HIGH (ref 70–99)
Glucose-Capillary: 160 mg/dL — ABNORMAL HIGH (ref 70–99)
Glucose-Capillary: 195 mg/dL — ABNORMAL HIGH (ref 70–99)

## 2021-03-02 NOTE — Progress Notes (Signed)
Progress Note  Patient Name: Fernando Maddox Date of Encounter: 03/02/2021  Primary Cardiologist:   Garwin Brothers, MD   Subjective   No chest pain.  No acute SOB.  He has foot pain.    Inpatient Medications    Scheduled Meds: . vitamin C  1,000 mg Oral Daily  . Chlorhexidine Gluconate Cloth  6 each Topical Daily  . docusate sodium  100 mg Oral BID  . famotidine  20 mg Oral Daily  . feeding supplement (GLUCERNA SHAKE)  237 mL Oral BID BM  . furosemide  40 mg Intravenous BID  . insulin aspart  0-15 Units Subcutaneous TID WC  . insulin aspart  0-5 Units Subcutaneous QHS  . mouth rinse  15 mL Mouth Rinse BID  . metoprolol succinate  12.5 mg Oral Daily  . midodrine  10 mg Oral TID WC  . multivitamin with minerals  1 tablet Oral Daily  . pantoprazole  40 mg Oral Daily  . potassium chloride  40 mEq Oral Daily  . zinc sulfate  220 mg Oral Daily   Continuous Infusions: . sodium chloride 10 mL/hr at 02/21/21 0810   PRN Meds: acetaminophen **OR** acetaminophen, albuterol, alum & mag hydroxide-simeth, bisacodyl, guaiFENesin-dextromethorphan, magnesium citrate, ondansetron **OR** ondansetron (ZOFRAN) IV, oxyCODONE-acetaminophen, phenol, polyethylene glycol, zolpidem   Vital Signs    Vitals:   03/02/21 0300 03/02/21 0430 03/02/21 0740 03/02/21 0815  BP: (!) 94/51 100/61 116/87 110/87  Pulse: 71 70 64 80  Resp: (!) 9 16 16    Temp:  98.3 F (36.8 C) 98.7 F (37.1 C)   TempSrc:  Oral Oral   SpO2: (!) 89% 93% 98%   Weight:      Height:        Intake/Output Summary (Last 24 hours) at 03/02/2021 1138 Last data filed at 03/02/2021 0900 Gross per 24 hour  Intake 586.34 ml  Output 2300 ml  Net -1713.66 ml   Filed Weights   02/27/21 0740 02/28/21 0500 03/01/21 0451  Weight: 105.3 kg 97.3 kg 96.7 kg    Telemetry    NSR - Personally Reviewed  ECG    NA - Personally Reviewed  Physical Exam   GEN: No acute distress.   Neck: No  JVD Cardiac: RRR, no murmurs, rubs, or  gallops.  Respiratory: Clear  to auscultation bilaterally. GI: Soft, nontender, non-distended  MS: No  edema; No deformity. Neuro:  Nonfocal  Psych: Normal affect   Labs    Chemistry Recent Labs  Lab 02/26/21 0238 02/27/21 0701 02/27/21 1500 02/28/21 1541 03/01/21 0458 03/02/21 0032  NA 125* 127*   < > 129* 130* 129*  K 4.4 3.6   < > 4.1 4.0 4.1  CL 90* 92*   < > 92* 92* 90*  CO2 22 30   < > 31 30 31   GLUCOSE 119* 139*   < > 139* 117* 134*  BUN 30* 24*   < > 17 19 19   CREATININE 1.42* 1.26*   < > 1.21 1.12 1.16  CALCIUM 8.3* 8.1*   < > 7.6* 8.2* 8.0*  PROT 6.8 6.0*  --   --  6.3*  --   ALBUMIN 2.4* 2.2*  --   --  2.3*  --   AST 46* 33  --   --  31  --   ALT 120* 87*  --   --  64*  --   ALKPHOS 120 102  --   --  100  --  BILITOT 2.2* 2.5*  --   --  1.4*  --   GFRNONAA 57* >60   < > >60 >60 >60  ANIONGAP 13 5   < > 6 8 8    < > = values in this interval not displayed.     Hematology Recent Labs  Lab 02/28/21 0615 03/01/21 0458 03/02/21 0032  WBC 5.3 4.9 5.7  RBC 3.72* 3.60* 3.46*  HGB 10.3* 10.0* 9.5*  HCT 31.5* 30.2* 29.6*  MCV 84.7 83.9 85.5  MCH 27.7 27.8 27.5  MCHC 32.7 33.1 32.1  RDW 18.0* 18.1* 18.4*  PLT 173 194 204    Cardiac EnzymesNo results for input(s): TROPONINI in the last 168 hours. No results for input(s): TROPIPOC in the last 168 hours.   BNP Recent Labs  Lab 02/26/21 1641  BNP 1,439.4*     DDimer No results for input(s): DDIMER in the last 168 hours.   Radiology    No results found.  Cardiac Studies   Echocardiogram  1. Left ventricular ejection fraction, by estimation, is 20 to 25%. The  left ventricle has severely decreased function. The left ventricle  demonstrates global hypokinesis. The left ventricular internal cavity size  was moderately dilated. Left  ventricular diastolic function could not be evaluated. There is the  interventricular septum is flattened in systole and diastole, consistent  with right ventricular  pressure and volume overload.  2. Right ventricular systolic function is severely reduced. The right  ventricular size is moderately enlarged.  3. Left atrial size was moderately dilated.  4. Right atrial size was moderately dilated.  5. The mitral valve is normal in structure. Mild to moderate mitral valve  regurgitation.  6. Tricuspid valve regurgitation is mild to moderate.  7. The aortic valve is tricuspid. There is mild calcification of the  aortic valve. Aortic valve regurgitation is not visualized.  8. Aortic dilatation noted. There is borderline dilatation of the  ascending aorta, measuring 40 mm.  9. The inferior vena cava is dilated in size with <50% respiratory  variability, suggesting right atrial pressure of 15 mmHg.    Patient Profile     60 y.o. male 44 with history ofCAD, permanent Afib, HFrEF, HTN, HLD, DMII, CVA, PAD with nonhealing foot wounds, OSA on CPAP, and tobacco abuse who initially presented to [facility] after being found down for 2 days. Was found to have LLE gangrene with concern for right arterial occlusion prompting transfer to [facility]. Formal vascular studies with normal ABI and negative LE dopplers for DVT. He was recommended for left transmetatarsal amputation. Cardiology was consulted on this admission for HFrEF and chronic Afib.Echocardiogram 4/22 showed EF of 20 to 25% with global hypokinesis,RV severely reduced, with moderate enlargement, LA/RAmoderately dilated,mild to moderate tricuspid regurgitation.No LV thrombus noted.  Assessment & Plan    Acute on chronic combined systolic and diastolic heart failure Negative 2 liters yesterday.  Tolerating current diuresis.  However, unable to titrate meds.  He has not had beta blocker and is on midodrine for his low BP.   I will transition to PO Lasix.   Permanent Atrial Fibrillation: CHADs-vasc 6.   Was bleeding from surgical site.  Xarelto held.    Hyponatremia: Low but stable.  OK to continue  diuresis.   HLD: Holding statin secondary to recent elevated liver enzymes.    AKI Creat has improved.     For questions or updates, please contact [facility] Please consult [email or web address] for contact info under Cardiology/STEMI.   Signed, 5/22  Genni Buske, MD  03/02/2021, 11:38 AM

## 2021-03-02 NOTE — Progress Notes (Signed)
Pt has been very verbally aggressive toward me all day. Making sexual comments and then calling me profanities such as fucking bitch and ass when I ask him to take his pills or I explain to him over and over again how he can't have more fluids.  The pt is non compliant and is making it extremely hard to communicate effectively without being verbally abused every time I am in the room. He calls the front desk repeatedly demanding more fluids and to "see the nurse" at which point he asks again for more fluids and pain medications. I have also explained to him the times for specific medications and when they can be given. I have been prompt about giving those and it has been documented. Will cont to monitor pt.

## 2021-03-02 NOTE — H&P (View-Only) (Signed)
Patient ID: Fernando Maddox, male   DOB: 08/05/1961, 59 y.o.   MRN: [id number] Patient has progressive wound breakdown and bleeding from the left transmetatarsal amputation.  Patient is having progressive wound dehiscence.  I discussed with patient this morning with the progressive wound dehiscence his best option is to proceed with a left  transtibial amputation.  I will schedule this for Wednesday.  Patient states he understands and wishes to proceed with surgery. 

## 2021-03-02 NOTE — Progress Notes (Signed)
Patient ID: Fernando Maddox, male   DOB: 1961/09/18, 60 y.o.   MRN: [id number] Patient has progressive wound breakdown and bleeding from the left transmetatarsal amputation.  Patient is having progressive wound dehiscence.  I discussed with patient this morning with the progressive wound dehiscence his best option is to proceed with a left  transtibial amputation.  I will schedule this for Wednesday.  Patient states he understands and wishes to proceed with surgery.

## 2021-03-02 NOTE — Progress Notes (Signed)
PROGRESS NOTE        PATIENT DETAILS Name: Fernando Maddox Age: 60 y.o. Sex: male Date of Birth: Aug 08, 1961 Admit Date: 02/14/2021 Admitting Physician Charlott Holler, MD PCP:Van Domenic Schwab, MD  Brief Narrative: Patient is a 60 y.o. male with history of HFrEF, permanent atrial fibrillation, CAD, DM-2, known PAD-s/p partial amputation of right great toe presented to [facility] as a transfer from [facility] ED for septic shock in the setting of critical right lower extremity ischemia.  Significant events: 4/21>> transferred to [facility] from [facility]- found down-gangrene is right lower extremity-CT angio performed which showed concern for acute right arterial occlusion. 4/24>> transfer to [facility]. 4/27>> left TMA 5/3>> fall after getting out of bed-bleeding from TMA site-with wound dehiscence. 5/5>> bleeding from amputation stump 5/6>> recurrent bleeding from amputation stump-stopped with compressive dressings.  Significant studies: 4/21>> CT angio abdominal aorta with iliofemoral runoff: No opacification beyond mid SFA-suspicious for arterial embolism.  L1, L2, L3 and L4 vertebral bodies compression fractures.  Mildly enlarged left retroperitoneal, pelvic and inguinal lymph nodes. 4/21>> CT chest: No acute pulmonary disease. 4/21>> CT head: No acute intracranial abnormality 4/21>> CT C-spine: No acute fractures. 4/22>> chest x-ray: No active disease 4/22>> right lower extremity arterial duplex: No evidence of stenosis or occlusive disease. 4/22>> Echo: EF 20-25%, RV systolic function severely reduced  Antimicrobial therapy: Vancomycin: 4/21>>4/24 Zosyn: 4/21>> 4/24 Unasyn: 4/25>>4/28  Microbiology data: None  Procedures : 4/27>> left transmetatarsal amputation  Consults: Vascular surgery, PCCM, orthopedics, cardiology  DVT Prophylaxis : IV heparin  Subjective: No bleeding from left TMA stump overnight.  Assessment/Plan: Septic shock due to critical left limb ischemia  with left forefoot ulceration/gangrene s/p transmetatarsal amputation on 4/27: Sepsis physiology has resolved-all antimicrobial therapy discontinued on 4/28.   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  He continued to have subsequent episodes of bleeding.  Xarelto held.  Orthopedics planning BKA on 5/11.  Acute metabolic encephalopathy: Due to above-found unresponsive at home-improved.  Anemia: Due to combination of anemia of chronic disease, iron deficiency anemia and acute blood loss anemia from TMA stump bleeding.  Follow CBC-needs outpatient endoscopic evaluation.  Acute on chronic systolic/diastolic heart failure/new RV failure: Volume status better-cardiology following.  Remains on IV diuretics-metoprolol-due to intermittent soft BP-on midodrine as well.  Chronic atrial fibrillation with RVR: Rate controlled-on metoprolol-Xarelto on hold due to TMA stump of bleeding.  Hyponatremia: Suspect due to CHF-continue fluid restriction-remains on IV Lasix with mild but improving hyponatremia  Hyperkalemia: Resolved  Transaminitis: LFTs have essentially normalized-no longer on amiodarone.  Acute hepatitis serology negative.  History of CAD: No anginal symptoms-not on aspirin as patient on anticoagulation  DM-2 (A1c 7.7 on 4/25): CBGs at goal. Continue SSI.Resume metformin on discharge.  Recent Labs    03/01/21 2010 03/02/21 0744 03/02/21 1146  GLUCAP 176* 151* 134*    Prior history of polysubstance use: UDS 3 years back positive for amphetamine/cannabinoids.  ?  OSA/central sleep apnea: Claims intolerant to CPAP-has not used it for the past several days.  Needs outpatient sleep study for formal diagnoses.  Goals of care: Noncompliant to medications-noncompliance to weightbearing status-very poor insight into his medical issues-poor social economic conditions-probably  has end-stage CHF-unfortunately his overall long-term prognosis is very poor.  I do not  think he is a candidate for aggressive therapies.  Evaluated by palliative care-DNR in place.  Will need ongoing goals of care discussion.  Nutrition Problem: Nutrition Problem: Moderate Malnutrition Etiology: chronic illness (CHF) Signs/Symptoms: mild muscle depletion,moderate muscle depletion,mild fat depletion,moderate fat depletion Interventions: MVI,Glucerna shake,Magic cup   Diet: Diet Order            Diet Carb Modified Fluid consistency: Thin; Room service appropriate? No; Fluid restriction: 1200 mL Fluid  Diet effective now                  Code Status: Full code   Family Communication: Called Father -[name]-[phone]-updated over the phone on 5/5-called on 5/7-voicemail not set up-unable to leave a voicemail.  Called mother- [name]-[phone]-updated over the phone on 4/29.  Disposition Plan: Status is: Inpatient  Remains inpatient appropriate because:Inpatient level of care appropriate due to severity of illness   Dispo: The patient is from: Home              Anticipated d/c is to: tbd              Patient currently is not medically stable to d/c.   Difficult to place patient No   Barriers to Discharge: ongoing diuresis-left BKA planned for Wednesday.   Antimicrobial agents: Anti-infectives (From admission, onward)   Start     Dose/Rate Route Frequency Ordered Stop   02/20/21 1100  ceFAZolin (ANCEF) IVPB 2g/100 mL premix        2 g 200 mL/hr over 30 Minutes Intravenous To Short Stay 02/20/21 0754 02/20/21 1254   02/18/21 1745  Ampicillin-Sulbactam (UNASYN) 3 g in sodium chloride 0.9 % 100 mL IVPB        3 g 200 mL/hr over 30 Minutes Intravenous Every 6 hours 02/18/21 1645 02/21/21 2359   02/18/21 1200  metroNIDAZOLE (FLAGYL) IVPB 500 mg  Status:  Discontinued        500 mg 100 mL/hr over 60 Minutes Intravenous Every 8 hours 02/18/21 1033 02/18/21  1043   02/18/21 1130  cefTRIAXone (ROCEPHIN) 2 g in sodium chloride 0.9 % 100 mL IVPB  Status:  Discontinued        2 g 200 mL/hr over 30 Minutes Intravenous Every 24 hours 02/18/21 1033 02/18/21 1043   02/15/21 0230  vancomycin (VANCOREADY) IVPB 1250 mg/250 mL  Status:  Discontinued        1,250 mg 166.7 mL/hr over 90 Minutes Intravenous Every 12 hours 02/15/21 0140 02/18/21 0735   02/15/21 0030  piperacillin-tazobactam (ZOSYN) IVPB 3.375 g  Status:  Discontinued        3.375 g 12.5 mL/hr over 240 Minutes Intravenous Every 8 hours 02/14/21 2338 02/18/21 1032      MEDICATIONS: Scheduled Meds: . vitamin C  1,000 mg Oral Daily  . Chlorhexidine Gluconate Cloth  6 each Topical Daily  . docusate sodium  100 mg Oral BID  . famotidine  20 mg Oral Daily  . feeding supplement (GLUCERNA SHAKE)  237 mL Oral BID BM  . furosemide  40 mg Intravenous BID  . insulin aspart  0-15 Units Subcutaneous TID WC  . insulin aspart  0-5 Units Subcutaneous QHS  . mouth rinse  15 mL Mouth Rinse BID  . metoprolol succinate  12.5 mg Oral Daily  . midodrine  10 mg Oral TID WC  . multivitamin with minerals  1 tablet Oral Daily  . pantoprazole  40 mg Oral Daily  . potassium chloride  40 mEq Oral  Daily  . zinc sulfate  220 mg Oral Daily   Continuous Infusions: . sodium chloride 10 mL/hr at 02/21/21 0810   PRN Meds:.acetaminophen **OR** acetaminophen, albuterol, alum & mag hydroxide-simeth, bisacodyl, guaiFENesin-dextromethorphan, magnesium citrate, ondansetron **OR** ondansetron (ZOFRAN) IV, oxyCODONE-acetaminophen, phenol, polyethylene glycol, zolpidem   PHYSICAL EXAM: Vital signs: Vitals:   03/02/21 0300 03/02/21 0430 03/02/21 0740 03/02/21 0815  BP: (!) 94/51 100/61 116/87 110/87  Pulse: 71 70 64 80  Resp: (!) 9 16 16    Temp:  98.3 F (36.8 C) 98.7 F (37.1 C)   TempSrc:  Oral Oral   SpO2: (!) 89% 93% 98%   Weight:      Height:       Filed Weights   02/27/21 0740 02/28/21 0500 03/01/21 0451   Weight: 105.3 kg 97.3 kg 96.7 kg   Body mass index is 26.65 kg/m.   Gen Exam:Alert awake-not in any distress HEENT:atraumatic, normocephalic Chest: B/L clear to auscultation anteriorly CVS:S1S2 regular Abdomen:soft non tender, non distended Extremities:no edema-left TMA stump dressed-not bleeding. Neurology: Non focal Skin: no rash  I have personally reviewed following labs and imaging studies  LABORATORY DATA: CBC: Recent Labs  Lab 02/26/21 2328 02/27/21 0701 02/28/21 0615 03/01/21 0458 03/02/21 0032  WBC  --  6.4 5.3 4.9 5.7  HGB 11.2* 11.3* 10.3* 10.0* 9.5*  HCT 33.7* 33.7* 31.5* 30.2* 29.6*  MCV  --  83.0 84.7 83.9 85.5  PLT  --  168 173 194 204    Basic Metabolic Panel: Recent Labs  Lab 02/27/21 1500 02/28/21 0615 02/28/21 1541 03/01/21 0458 03/02/21 0032  NA 130* 129* 129* 130* 129*  K 3.7 3.4* 4.1 4.0 4.1  CL 91* 90* 92* 92* 90*  CO2 29 31 31 30 31   GLUCOSE 132* 135* 139* 117* 134*  BUN 25* 22* 17 19 19   CREATININE 1.33* 1.19 1.21 1.12 1.16  CALCIUM 8.0* 8.0* 7.6* 8.2* 8.0*    GFR: Estimated Creatinine Clearance: 82 mL/min (by C-G formula based on SCr of 1.16 mg/dL).  Liver Function Tests: Recent Labs  Lab 02/24/21 0742 02/25/21 0017 02/26/21 0238 02/27/21 0701 03/01/21 0458  AST 94* 71* 46* 33 31  ALT 169* 147* 120* 87* 64*  ALKPHOS 96 112 120 102 100  BILITOT 2.0* 1.9* 2.2* 2.5* 1.4*  PROT 6.0* 6.3* 6.8 6.0* 6.3*  ALBUMIN 2.3* 2.3* 2.4* 2.2* 2.3*   No results for input(s): LIPASE, AMYLASE in the last 168 hours. No results for input(s): AMMONIA in the last 168 hours.  Coagulation Profile: No results for input(s): INR, PROTIME in the last 168 hours.  Cardiac Enzymes: No results for input(s): CKTOTAL, CKMB, CKMBINDEX, TROPONINI in the last 168 hours.  BNP (last 3 results) No results for input(s): PROBNP in the last 8760 hours.  Lipid Profile: No results for input(s): CHOL, HDL, LDLCALC, TRIG, CHOLHDL, LDLDIRECT in the last 72  hours.  Thyroid Function Tests: No results for input(s): TSH, T4TOTAL, FREET4, T3FREE, THYROIDAB in the last 72 hours.  Anemia Panel: Recent Labs    03/01/21 0744  FERRITIN 69  TIBC 325  IRON 30*    Urine analysis: No results found for: COLORURINE, APPEARANCEUR, LABSPEC, PHURINE, GLUCOSEU, HGBUR, BILIRUBINUR, KETONESUR, PROTEINUR, UROBILINOGEN, NITRITE, LEUKOCYTESUR  Sepsis Labs: Lactic Acid, Venous    Component Value Date/Time   LATICACIDVEN 1.6 02/15/2021 0250    MICROBIOLOGY: Recent Results (from the past 240 hour(s))  SARS CORONAVIRUS 2 (TAT 6-24 HRS) Nasopharyngeal Nasopharyngeal Swab     Status: None  Collection Time: 02/25/21 12:53 PM   Specimen: Nasopharyngeal Swab  Result Value Ref Range Status   SARS Coronavirus 2 NEGATIVE NEGATIVE Final    Comment: (NOTE) SARS-CoV-2 target nucleic acids are NOT DETECTED.  The SARS-CoV-2 RNA is generally detectable in upper and lower respiratory specimens during the acute phase of infection. Negative results do not preclude SARS-CoV-2 infection, do not rule out co-infections with other pathogens, and should not be used as the sole basis for treatment or other patient management decisions. Negative results must be combined with clinical observations, patient history, and epidemiological information. The expected result is Negative.  Fact Sheet for Patients: HairSlick.no  Fact Sheet for Healthcare Providers: quierodirigir.com  This test is not yet approved or cleared by the [place] FDA and  has been authorized for detection and/or diagnosis of SARS-CoV-2 by FDA under an Emergency Use Authorization (EUA). This EUA will remain  in effect (meaning this test can be used) for the duration of the COVID-19 declaration under Se ction 564(b)(1) of the Act, 21 U.S.C. section 360bbb-3(b)(1), unless the authorization is terminated or revoked sooner.  Performed at [facility], [address] [place], [place] [address]     RADIOLOGY STUDIES/RESULTS: No results found.   LOS: 16 days   Jeoffrey Massed, MD  [facility]  To contact the attending provider between 7A-7P or the covering provider during after hours 7P-7A, please log into the web site [email or web address] and access using universal [place] password for that web site. If you do not have the password, please call the hospital operator.  03/02/2021, 1:34 PM   Attending MD: Patient was seen and examined-with resident MD-above-noted plan was formulated in conjunction with resident MD.  Subjective: Has another episode of bleeding from his left foot stump-stopped with compressive dressings.  Nursing staff informed orthopedic PA-C.  Objective: Blood pressure (!) 83/55, pulse 80, temperature 97.8 F (36.6 C), temperature source Oral, resp. rate 16, height 6\' 3"  (1.905 m), weight 96.7 kg, SpO2 98 %.  Chest: Bilaterally clear to auscultation CVS: S1-S2 regular Abdomen: Soft nontender nondistended Extremities: Left TMA-dressing in place. Neurology: None focal.  Assessment and plan Decompensated systolic heart failure: Volume status improving-continue IV Lasix  Hyponatremia: Due to hypervolemia-improving with diuretics.  Left lower extremity gangrene-s/p left TMA-now with wound dehiscence and bleeding: Continue with compressive dressing-await orthopedics input.  Xarelto on hold.  Monitor CBC.  Rest as above.

## 2021-03-03 ENCOUNTER — Inpatient Hospital Stay (HOSPITAL_COMMUNITY): Payer: Medicare Other

## 2021-03-03 DIAGNOSIS — L97524 Non-pressure chronic ulcer of other part of left foot with necrosis of bone: Secondary | ICD-10-CM | POA: Diagnosis not present

## 2021-03-03 LAB — URINALYSIS, ROUTINE W REFLEX MICROSCOPIC
Bilirubin Urine: NEGATIVE
Glucose, UA: NEGATIVE mg/dL
Hgb urine dipstick: NEGATIVE
Ketones, ur: NEGATIVE mg/dL
Leukocytes,Ua: NEGATIVE
Nitrite: NEGATIVE
Protein, ur: NEGATIVE mg/dL
Specific Gravity, Urine: 1.017 (ref 1.005–1.030)
pH: 7 (ref 5.0–8.0)

## 2021-03-03 LAB — BASIC METABOLIC PANEL
Anion gap: 7 (ref 5–15)
BUN: 22 mg/dL — ABNORMAL HIGH (ref 6–20)
CO2: 29 mmol/L (ref 22–32)
Calcium: 8 mg/dL — ABNORMAL LOW (ref 8.9–10.3)
Chloride: 93 mmol/L — ABNORMAL LOW (ref 98–111)
Creatinine, Ser: 1.19 mg/dL (ref 0.61–1.24)
GFR, Estimated: >60 mL/min (ref >60)
Glucose, Bld: 107 mg/dL — ABNORMAL HIGH (ref 70–99)
Potassium: 4.4 mmol/L (ref 3.5–5.1)
Sodium: 129 mmol/L — ABNORMAL LOW (ref 135–145)

## 2021-03-03 LAB — CBC
HCT: 29.9 % — ABNORMAL LOW (ref 39.0–52.0)
Hemoglobin: 9.6 g/dL — ABNORMAL LOW (ref 13.0–17.0)
MCH: 27.7 pg (ref 26.0–34.0)
MCHC: 32.1 g/dL (ref 30.0–36.0)
MCV: 86.2 fL (ref 80.0–100.0)
Platelets: 235 10*3/uL (ref 150–400)
RBC: 3.47 MIL/uL — ABNORMAL LOW (ref 4.22–5.81)
RDW: 18.5 % — ABNORMAL HIGH (ref 11.5–15.5)
WBC: 5.3 10*3/uL (ref 4.0–10.5)
nRBC: 0 % (ref 0.0–0.2)

## 2021-03-03 LAB — GLUCOSE, CAPILLARY
Glucose-Capillary: 103 mg/dL — ABNORMAL HIGH (ref 70–99)
Glucose-Capillary: 142 mg/dL — ABNORMAL HIGH (ref 70–99)
Glucose-Capillary: 158 mg/dL — ABNORMAL HIGH (ref 70–99)
Glucose-Capillary: 149 mg/dL — ABNORMAL HIGH (ref 70–99)

## 2021-03-03 LAB — BRAIN NATRIURETIC PEPTIDE: B Natriuretic Peptide: 1086.7 pg/mL — ABNORMAL HIGH (ref 0.0–100.0)

## 2021-03-03 LAB — OSMOLALITY: Osmolality: 283 mOsm/kg (ref 275–295)

## 2021-03-03 LAB — URIC ACID: Uric Acid, Serum: 5.2 mg/dL (ref 3.7–8.6)

## 2021-03-03 LAB — OSMOLALITY, URINE: Osmolality, Ur: 544 mOsm/kg (ref 300–900)

## 2021-03-03 LAB — SODIUM, URINE, RANDOM: Sodium, Ur: 17 mmol/L

## 2021-03-03 MED ORDER — LACTATED RINGERS IV SOLN: INTRAVENOUS | Status: DC

## 2021-03-03 MED ORDER — LACTATED RINGERS IV SOLN: INTRAVENOUS | Status: AC

## 2021-03-03 NOTE — Progress Notes (Signed)
Progress Note  Patient Name: Fernando Maddox Date of Encounter: 03/03/2021  Primary Cardiologist:   Garwin Brothers, MD   Subjective   Lasix discontinued secondary to low BP.    Inpatient Medications    Scheduled Meds: . vitamin C  1,000 mg Oral Daily  . Chlorhexidine Gluconate Cloth  6 each Topical Daily  . docusate sodium  100 mg Oral BID  . famotidine  20 mg Oral Daily  . feeding supplement (GLUCERNA SHAKE)  237 mL Oral BID BM  . insulin aspart  0-15 Units Subcutaneous TID WC  . insulin aspart  0-5 Units Subcutaneous QHS  . mouth rinse  15 mL Mouth Rinse BID  . metoprolol succinate  12.5 mg Oral Daily  . midodrine  10 mg Oral TID WC  . multivitamin with minerals  1 tablet Oral Daily  . pantoprazole  40 mg Oral Daily  . potassium chloride  40 mEq Oral Daily  . zinc sulfate  220 mg Oral Daily   Continuous Infusions: . sodium chloride 10 mL/hr at 02/21/21 0810   PRN Meds: acetaminophen **OR** [DISCONTINUED] acetaminophen, albuterol, alum & mag hydroxide-simeth, bisacodyl, guaiFENesin-dextromethorphan, magnesium citrate, [DISCONTINUED] ondansetron **OR** ondansetron (ZOFRAN) IV, oxyCODONE-acetaminophen, phenol, polyethylene glycol, zolpidem   Vital Signs    Vitals:   03/02/21 2015 03/03/21 0000 03/03/21 0400 03/03/21 0737  BP: 106/60 100/66 114/71 93/65  Pulse:    75  Resp: 18 15 16 17   Temp: 98.1 F (36.7 C) 98.2 F (36.8 C) 98.2 F (36.8 C) 97.7 F (36.5 C)  TempSrc: Oral Oral Oral Oral  SpO2: 94% 98% 98% 95%  Weight:   95.3 kg   Height:        Intake/Output Summary (Last 24 hours) at 03/03/2021 0934 Last data filed at 03/03/2021 0600 Gross per 24 hour  Intake 957 ml  Output 3060 ml  Net -2103 ml   Filed Weights   02/28/21 0500 03/01/21 0451 03/03/21 0400  Weight: 97.3 kg 96.7 kg 95.3 kg    Telemetry    Atrial fib with controlled ventricular rate and occasional ventricular ectopy - Personally Reviewed  ECG    NA - Personally Reviewed  Physical  Exam   GEN: No  acute distress.   Neck: No  JVD Cardiac: Irregular RR, no murmurs, rubs, or gallops.  Respiratory: Clear to auscultation bilaterally. GI: Soft, nontender, non-distended, normal bowel sounds  MS:  No edema; No deformity. Neuro:   Nonfocal  Psych: Oriented and appropriate although somnolent   Labs    Chemistry Recent Labs  Lab 02/26/21 0238 02/27/21 0701 02/27/21 1500 03/01/21 0458 03/02/21 0032 03/03/21 0043  NA 125* 127*   < > 130* 129* 129*  K 4.4 3.6   < > 4.0 4.1 4.4  CL 90* 92*   < > 92* 90* 93*  CO2 22 30   < > 30 31 29   GLUCOSE 119* 139*   < > 117* 134* 107*  BUN 30* 24*   < > 19 19 22*  CREATININE 1.42* 1.26*   < > 1.12 1.16 1.19  CALCIUM 8.3* 8.1*   < > 8.2* 8.0* 8.0*  PROT 6.8 6.0*  --  6.3*  --   --   ALBUMIN 2.4* 2.2*  --  2.3*  --   --   AST 46* 33  --  31  --   --   ALT 120* 87*  --  64*  --   --   ALKPHOS 120  102  --  100  --   --   BILITOT 2.2* 2.5*  --  1.4*  --   --   GFRNONAA 57* >60   < > >60 >60 >60  ANIONGAP 13 5   < > 8 8 7    < > = values in this interval not displayed.     Hematology Recent Labs  Lab 03/01/21 0458 03/02/21 0032 03/03/21 0043  WBC 4.9 5.7 5.3  RBC 3.60* 3.46* 3.47*  HGB 10.0* 9.5* 9.6*  HCT 30.2* 29.6* 29.9*  MCV 83.9 85.5 86.2  MCH 27.8 27.5 27.7  MCHC 33.1 32.1 32.1  RDW 18.1* 18.4* 18.5*  PLT 194 204 235    Cardiac EnzymesNo results for input(s): TROPONINI in the last 168 hours. No results for input(s): TROPIPOC in the last 168 hours.   BNP Recent Labs  Lab 02/26/21 1641  BNP 1,439.4*     DDimer No results for input(s): DDIMER in the last 168 hours.   Radiology    No results found.  Cardiac Studies   Echocardiogram  1. Left ventricular ejection fraction, by estimation, is 20 to 25%. The  left ventricle has severely decreased function. The left ventricle  demonstrates global hypokinesis. The left ventricular internal cavity size  was moderately dilated. Left  ventricular  diastolic function could not be evaluated. There is the  interventricular septum is flattened in systole and diastole, consistent  with right ventricular pressure and volume overload.  2. Right ventricular systolic function is severely reduced. The right  ventricular size is moderately enlarged.  3. Left atrial size was moderately dilated.  4. Right atrial size was moderately dilated.  5. The mitral valve is normal in structure. Mild to moderate mitral valve  regurgitation.  6. Tricuspid valve regurgitation is mild to moderate.  7. The aortic valve is tricuspid. There is mild calcification of the  aortic valve. Aortic valve regurgitation is not visualized.  8. Aortic dilatation noted. There is borderline dilatation of the  ascending aorta, measuring 40 mm.  9. The inferior vena cava is dilated in size with <50% respiratory  variability, suggesting right atrial pressure of 15 mmHg.    Patient Profile     60 y.o. male 41 with history ofCAD, permanent Afib, HFrEF, HTN, HLD, DMII, CVA, PAD with nonhealing foot wounds, OSA on CPAP, and tobacco abuse who initially presented to [place] after being found down for 2 days. Was found to have LLE gangrene with concern for right arterial occlusion prompting transfer to [facility]. Formal vascular studies with normal ABI and negative LE dopplers for DVT. He was recommended for left transmetatarsal amputation. Cardiology was consulted on this admission for HFrEF and chronic Afib.Echocardiogram 4/22 showed EF of 20 to 25% with global hypokinesis,RV severely reduced, with moderate enlargement, LA/RAmoderately dilated,mild to moderate tricuspid regurgitation.No LV thrombus noted.  Assessment & Plan    Acute on chronic combined systolic and diastolic heart failure Net negative 1.6 liters.  Unable to titrate meds with hypotension.  No acute findings.  For further surgery Monday.  Watch volume closely.  At risk secondary to severe cardiomyopathy.   However, surgery is necessary and risk is acceptable in this situation.    Permanent Atrial Fibrillation: CHADs-vasc 6.   Was bleeding from surgical site.  Xarelto held.   Rate controlled.  Resume Xarelto when able post op.    Hyponatremia: Na is low but unchanged over the past two days.  Nursing is trying to restrict PO free water intake  and reports that the patient is verbally abusive over this issue.   HLD: Holding statin secondary to recent elevated liver enzymes.      For questions or updates, please contact [facility] Please consult [email or web address] for contact info under Cardiology/STEMI.   Signed, Rollene Rotunda, MD  03/03/2021, 9:34 AM

## 2021-03-03 NOTE — Progress Notes (Signed)
PROGRESS NOTE        PATIENT DETAILS Name: Fernando Maddox Age: 60 y.o. Sex: male Date of Birth: 02/18/1961 Admit Date: 02/14/2021 Admitting Physician Fernando Holler, MD PCP:Fernando Domenic Schwab, MD  Brief Narrative: Patient is a 60 y.o. male with history of HFrEF, permanent atrial fibrillation, CAD, DM-2, known PAD-s/p partial amputation of right great toe presented to [facility] as a transfer from [facility] ED for septic shock in the setting of critical right lower extremity ischemia.  Significant events: 4/21>> transferred to [facility] from [facility]- found down-gangrene is right lower extremity-CT angio performed which showed concern for acute right arterial occlusion. 4/24>> transfer to [facility]. 4/27>> left TMA 5/3>> fall after getting out of bed-bleeding from TMA site-with wound dehiscence. 5/5>> bleeding from amputation stump 5/6>> recurrent bleeding from amputation stump-stopped with compressive dressings.  Significant studies: 4/21>> CT angio abdominal aorta with iliofemoral runoff: No opacification beyond mid SFA-suspicious for arterial embolism.  L1, L2, L3 and L4 vertebral bodies compression fractures.  Mildly enlarged left retroperitoneal, pelvic and inguinal lymph nodes. 4/21>> CT chest: No acute pulmonary disease. 4/21>> CT head: No acute intracranial abnormality 4/21>> CT C-spine: No acute fractures. 4/22>> chest x-ray: No active disease 4/22>> right lower extremity arterial duplex: No evidence of stenosis or occlusive disease. 4/22>> Echo: EF 20-25%, RV systolic function severely reduced  Antimicrobial therapy: Vancomycin: 4/21>>4/24 Zosyn: 4/21>> 4/24 Unasyn: 4/25>>4/28  Microbiology data: None  Procedures : 4/27>> left transmetatarsal amputation  Consults: Vascular surgery, PCCM, orthopedics, cardiology  DVT Prophylaxis : IV heparin  Subjective:  Patient in bed, appears comfortable, denies any headache, no fever, no chest pain or pressure, no shortness of  breath , no abdominal pain. No new focal weakness.   Assessment/Plan:  Septic shock due to critical left limb ischemia with left forefoot ulceration/gangrene s/p transmetatarsal amputation on 4/27: Sepsis physiology has resolved-all antimicrobial therapy discontinued on 4/28.   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  He continued to have subsequent episodes of bleeding.  Xarelto held.  Orthopedics planning BKA on 5/11.  Acute metabolic encephalopathy: Due to above-found unresponsive at home-improved.  Anemia: Due to combination of anemia of chronic disease, iron deficiency anemia and acute blood loss anemia from TMA stump bleeding.  Follow CBC-needs outpatient endoscopic evaluation.  Acute on chronic systolic/diastolic heart failure/new RV failure: Volume status better-cardiology following.  Remains on IV diuretics-metoprolol-due to intermittent soft BP-on midodrine as well.  Chronic atrial fibrillation with RVR: Rate controlled-on metoprolol-Xarelto on hold due to TMA stump of bleeding.  If H&H remains stable for the next few days may start heparin drip with caution.  Hyponatremia: Now appears dehydrated on 03/03/2021, hold Lasix, gentle IV fluids, serum and urine electrolytes.  Lungs are clear on exam he has no orthopnea, reduced skin turgor.  Transaminitis: LFTs have essentially normalized-no longer on amiodarone.  Acute hepatitis serology negative.  History of CAD: No anginal symptoms-not on aspirin as patient on anticoagulation  DM-2 (A1c 7.7 on 4/25): CBGs at goal. Continue SSI.Resume metformin on discharge.  Recent Labs    03/02/21 1712 03/02/21 2032 03/03/21 0744  GLUCAP 160* 195* 103*    Prior history of polysubstance use: UDS 3 years back positive for amphetamine/cannabinoids.  ?  OSA/central sleep apnea: Noncompliant with it despite counseling,  reconsult  on 03/03/2021 outpatient sleep study and pulmonary follow-up to be arranged by PCP.  Goals of care: Noncompliant to medications-noncompliance to weightbearing status-very poor insight into his medical issues-poor social economic conditions-probably has end-stage CHF-unfortunately his overall long-term prognosis is very poor.  I do not think he is a candidate for aggressive therapies.  Evaluated by palliative care-DNR in place.  Will need ongoing goals of care discussion.  Nutrition Problem: Nutrition Problem: Moderate Malnutrition Etiology: chronic illness (CHF) Signs/Symptoms: mild muscle depletion,moderate muscle depletion,mild fat depletion,moderate fat depletion Interventions: MVI,Glucerna shake,Magic cup   Diet: Diet Order            Diet Carb Modified Fluid consistency: Thin; Room service appropriate? No; Fluid restriction: 1200 mL Fluid  Diet effective now                  Code Status: Full code   Family Communication:  Called Father -Fernando [phone]-updated over the phone on 5/5-called on 5/7-voicemail not set up-unable to leave a voicemail.  Called mother- Fernando [phone]-updated over the phone on 4/29.  Disposition Plan: Status is: Inpatient  Remains inpatient appropriate because:Inpatient level of care appropriate due to severity of illness   Dispo: The patient is from: Home              Anticipated d/c is to: tbd              Patient currently is not medically stable to d/c.   Difficult to place patient No   Barriers to Discharge: ongoing diuresis-left BKA planned for Wednesday.   Antimicrobial agents: Anti-infectives (From admission, onward)   Start     Dose/Rate Route Frequency Ordered Stop   02/20/21 1100  ceFAZolin (ANCEF) IVPB 2g/100 mL premix        2 g 200 mL/hr over 30 Minutes Intravenous To Short Stay 02/20/21 0754 02/20/21 1254   02/18/21 1745  Ampicillin-Sulbactam (UNASYN) 3 g in sodium chloride 0.9 % 100 mL IVPB        3  g 200 mL/hr over 30 Minutes Intravenous Every 6 hours 02/18/21 1645 02/21/21 2359   02/18/21 1200  metroNIDAZOLE (FLAGYL) IVPB 500 mg  Status:  Discontinued        500 mg 100 mL/hr over 60 Minutes Intravenous Every 8 hours 02/18/21 1033 02/18/21 1043   02/18/21 1130  cefTRIAXone (ROCEPHIN) 2 g in sodium chloride 0.9 % 100 mL IVPB  Status:  Discontinued        2 g 200 mL/hr over 30 Minutes Intravenous Every 24 hours 02/18/21 1033 02/18/21 1043   02/15/21 0230  vancomycin (VANCOREADY) IVPB 1250 mg/250 mL  Status:  Discontinued        1,250 mg 166.7 mL/hr over 90 Minutes Intravenous Every 12 hours 02/15/21 0140 02/18/21 0735   02/15/21 0030  piperacillin-tazobactam (ZOSYN) IVPB 3.375 g  Status:  Discontinued        3.375 g 12.5 mL/hr over 240 Minutes Intravenous Every 8 hours 02/14/21 2338 02/18/21 1032      MEDICATIONS: Scheduled Meds: . vitamin C  1,000 mg Oral Daily  . Chlorhexidine Gluconate Cloth  6 each Topical Daily  . docusate sodium  100 mg Oral BID  . famotidine  20 mg Oral Daily  . feeding supplement (GLUCERNA SHAKE)  237 mL Oral BID BM  . insulin aspart  0-15 Units Subcutaneous TID WC  . insulin aspart  0-5 Units Subcutaneous QHS  . mouth rinse  15 mL Mouth Rinse BID  . metoprolol  succinate  12.5 mg Oral Daily  . midodrine  10 mg Oral TID WC  . multivitamin with minerals  1 tablet Oral Daily  . pantoprazole  40 mg Oral Daily  . potassium chloride  40 mEq Oral Daily  . zinc sulfate  220 mg Oral Daily   Continuous Infusions: . sodium chloride 10 mL/hr at 02/21/21 0810  . lactated ringers     PRN Meds:.acetaminophen **OR** [DISCONTINUED] acetaminophen, albuterol, alum & mag hydroxide-simeth, bisacodyl, guaiFENesin-dextromethorphan, magnesium citrate, [DISCONTINUED] ondansetron **OR** ondansetron (ZOFRAN) IV, oxyCODONE-acetaminophen, phenol, polyethylene glycol, zolpidem   PHYSICAL EXAM:  Vitals:   03/02/21 2015 03/03/21 0000 03/03/21 0400 03/03/21 0737  BP: 106/60  100/66 114/71 93/65  Pulse:    75  Resp: 18 15 16 17   Temp: 98.1 F (36.7 C) 98.2 F (36.8 C) 98.2 F (36.8 C) 97.7 F (36.5 C)  TempSrc: Oral Oral Oral Oral  SpO2: 94% 98% 98% 95%  Weight:   95.3 kg   Height:       Filed Weights   02/28/21 0500 03/01/21 0451 03/03/21 0400  Weight: 97.3 kg 96.7 kg 95.3 kg   Body mass index is 26.26 kg/m.   Gen Exam:  Awake Alert, No new F.N deficits, reduced skin turgor on exam, [place].AT,PERRAL Supple Neck,No JVD, No cervical lymphadenopathy appriciated.  Symmetrical Chest wall movement, Good air movement bilaterally, CTAB RRR,No Gallops, Rubs or new Murmurs, No Parasternal Heave +ve B.Sounds, Abd Soft, No tenderness, No organomegaly appriciated, No rebound - guarding or rigidity. No Cyanosis, L TMA stump under bandage   I have personally reviewed following labs and imaging studies  LABORATORY DATA:  Recent Labs  Lab 02/27/21 0701 02/28/21 0615 03/01/21 0458 03/02/21 0032 03/03/21 0043  WBC 6.4 5.3 4.9 5.7 5.3  HGB 11.3* 10.3* 10.0* 9.5* 9.6*  HCT 33.7* 31.5* 30.2* 29.6* 29.9*  PLT 168 173 194 204 235  MCV 83.0 84.7 83.9 85.5 86.2  MCH 27.8 27.7 27.8 27.5 27.7  MCHC 33.5 32.7 33.1 32.1 32.1  RDW 17.9* 18.0* 18.1* 18.4* 18.5*    Recent Labs  Lab 02/25/21 0017 02/26/21 0238 02/26/21 1641 02/27/21 0701 02/27/21 1500 02/28/21 0615 02/28/21 1541 03/01/21 0458 03/02/21 0032 03/03/21 0043 03/03/21 0905  NA 128* 125*  --  127*   < > 129* 129* 130* 129* 129*  --   K 4.9 4.4  --  3.6   < > 3.4* 4.1 4.0 4.1 4.4  --   CL 95* 90*  --  92*   < > 90* 92* 92* 90* 93*  --   CO2 23 22  --  30   < > 31 31 30 31 29   --   GLUCOSE 115* 119*  --  139*   < > 135* 139* 117* 134* 107*  --   BUN 29* 30*  --  24*   < > 22* 17 19 19  22*  --   CREATININE 1.67* 1.42*  --  1.26*   < > 1.19 1.21 1.12 1.16 1.19  --   CALCIUM 8.2* 8.3*  --  8.1*   < > 8.0* 7.6* 8.2* 8.0* 8.0*  --   AST 71* 46*  --  33  --   --   --  31  --   --   --   ALT 147*  120*  --  87*  --   --   --  64*  --   --   --   ALKPHOS 112 120  --  102  --   --   --  100  --   --   --   BILITOT 1.9* 2.2*  --  2.5*  --   --   --  1.4*  --   --   --   ALBUMIN 2.3* 2.4*  --  2.2*  --   --   --  2.3*  --   --   --   BNP  --   --  1,439.4*  --   --   --   --   --   --   --  1,086.7*   < > = values in this interval not displayed.     MICROBIOLOGY: Recent Results (from the past 240 hour(s))  SARS CORONAVIRUS 2 (TAT 6-24 HRS) Nasopharyngeal Nasopharyngeal Swab     Status: None   Collection Time: 02/25/21 12:53 PM   Specimen: Nasopharyngeal Swab  Result Value Ref Range Status   SARS Coronavirus 2 NEGATIVE NEGATIVE Final    Comment: (NOTE) SARS-CoV-2 target nucleic acids are NOT DETECTED.  The SARS-CoV-2 RNA is generally detectable in upper and lower respiratory specimens during the acute phase of infection. Negative results do not preclude SARS-CoV-2 infection, do not rule out co-infections with other pathogens, and should not be used as the sole basis for treatment or other patient management decisions. Negative results must be combined with clinical observations, patient history, and epidemiological information. The expected result is Negative.  Fact Sheet for Patients: HairSlick.no  Fact Sheet for Healthcare Providers: quierodirigir.com  This test is not yet approved or cleared by the [place] FDA and  has been authorized for detection and/or diagnosis of SARS-CoV-2 by FDA under an Emergency Use Authorization (EUA). This EUA will remain  in effect (meaning this test can be used) for the duration of the COVID-19 declaration under Se ction 564(b)(1) of the Act, 21 U.S.C. section 360bbb-3(b)(1), unless the authorization is terminated or revoked sooner.  Performed at [facility], [address] [place], [place] [address]     RADIOLOGY STUDIES/RESULTS: No results found.   LOS: 17 days    Signature  Susa Raring M.D on 03/03/2021 at 10:25 AM   -  To page go to [email or web address]

## 2021-03-03 NOTE — Plan of Care (Signed)
Plan for possible surgery Wednesday 5/11.  Problem: Education: Goal: Knowledge of the prescribed therapeutic regimen will improve Outcome: Progressing Goal: Ability to verbalize activity precautions or restrictions will improve Outcome: Progressing Goal: Understanding of discharge needs will improve Outcome: Progressing   Problem: Activity: Goal: Ability to perform//tolerate increased activity and mobilize with assistive devices will improve Outcome: Progressing   Problem: Clinical Measurements: Goal: Postoperative complications will be avoided or minimized Outcome: Progressing   Problem: Self-Care: Goal: Ability to meet self-care needs will improve Outcome: Progressing   Problem: Self-Concept: Goal: Ability to maintain and perform role responsibilities to the fullest extent possible will improve Outcome: Progressing   Problem: Pain Management: Goal: Pain level will decrease with appropriate interventions Outcome: Progressing   Problem: Skin Integrity: Goal: Demonstration of wound healing without infection will improve Outcome: Progressing

## 2021-03-04 ENCOUNTER — Inpatient Hospital Stay (HOSPITAL_COMMUNITY): Payer: Medicare Other

## 2021-03-04 ENCOUNTER — Other Ambulatory Visit: Payer: Self-pay | Admitting: Physician Assistant

## 2021-03-04 LAB — COMPREHENSIVE METABOLIC PANEL
ALT: 40 U/L (ref 0–44)
AST: 27 U/L (ref 15–41)
Albumin: 2.3 g/dL — ABNORMAL LOW (ref 3.5–5.0)
Alkaline Phosphatase: 92 U/L (ref 38–126)
Anion gap: 7 (ref 5–15)
BUN: 18 mg/dL (ref 6–20)
CO2: 30 mmol/L (ref 22–32)
Calcium: 8.3 mg/dL — ABNORMAL LOW (ref 8.9–10.3)
Chloride: 94 mmol/L — ABNORMAL LOW (ref 98–111)
Creatinine, Ser: 1.22 mg/dL (ref 0.61–1.24)
GFR, Estimated: >60 mL/min (ref >60)
Glucose, Bld: 120 mg/dL — ABNORMAL HIGH (ref 70–99)
Potassium: 4 mmol/L (ref 3.5–5.1)
Sodium: 131 mmol/L — ABNORMAL LOW (ref 135–145)
Total Bilirubin: 1 mg/dL (ref 0.3–1.2)
Total Protein: 6.2 g/dL — ABNORMAL LOW (ref 6.5–8.1)

## 2021-03-04 LAB — CBC WITH DIFFERENTIAL/PLATELET
Abs Immature Granulocytes: 0.02 10*3/uL (ref 0.00–0.07)
Basophils Absolute: 0.1 10*3/uL (ref 0.0–0.1)
Basophils Relative: 2 %
Eosinophils Absolute: 0.4 10*3/uL (ref 0.0–0.5)
Eosinophils Relative: 6 %
HCT: 29.2 % — ABNORMAL LOW (ref 39.0–52.0)
Hemoglobin: 9.4 g/dL — ABNORMAL LOW (ref 13.0–17.0)
Immature Granulocytes: 0 %
Lymphocytes Relative: 18 %
Lymphs Abs: 1.1 10*3/uL (ref 0.7–4.0)
MCH: 27.6 pg (ref 26.0–34.0)
MCHC: 32.2 g/dL (ref 30.0–36.0)
MCV: 85.6 fL (ref 80.0–100.0)
Monocytes Absolute: 0.7 10*3/uL (ref 0.1–1.0)
Monocytes Relative: 12 %
Neutro Abs: 3.7 10*3/uL (ref 1.7–7.7)
Neutrophils Relative %: 62 %
Platelets: 267 10*3/uL (ref 150–400)
RBC: 3.41 MIL/uL — ABNORMAL LOW (ref 4.22–5.81)
RDW: 18.6 % — ABNORMAL HIGH (ref 11.5–15.5)
WBC: 5.9 10*3/uL (ref 4.0–10.5)
nRBC: 0 % (ref 0.0–0.2)

## 2021-03-04 LAB — GLUCOSE, CAPILLARY
Glucose-Capillary: 153 mg/dL — ABNORMAL HIGH (ref 70–99)
Glucose-Capillary: 147 mg/dL — ABNORMAL HIGH (ref 70–99)
Glucose-Capillary: 134 mg/dL — ABNORMAL HIGH (ref 70–99)
Glucose-Capillary: 359 mg/dL — ABNORMAL HIGH (ref 70–99)

## 2021-03-04 LAB — TYPE AND SCREEN
ABO/RH(D): O POS
Antibody Screen: NEGATIVE

## 2021-03-04 LAB — MAGNESIUM: Magnesium: 1.7 mg/dL (ref 1.7–2.4)

## 2021-03-04 LAB — ABO/RH: ABO/RH(D): O POS

## 2021-03-04 LAB — PROTIME-INR
INR: 1.3 — ABNORMAL HIGH (ref 0.8–1.2)
Prothrombin Time: 15.9 seconds — ABNORMAL HIGH (ref 11.4–15.2)

## 2021-03-04 LAB — BRAIN NATRIURETIC PEPTIDE: B Natriuretic Peptide: 1147.2 pg/mL — ABNORMAL HIGH (ref 0.0–100.0)

## 2021-03-04 NOTE — Progress Notes (Signed)
PROGRESS NOTE        PATIENT DETAILS Name: Fernando Maddox Age: 60 y.o. Sex: male Date of Birth: Jun 10, 1961 Admit Date: 02/14/2021 Admitting Physician Charlott Holler, MD PCP:Van Domenic Schwab, MD  Brief Narrative: Patient is a 60 y.o. male with history of HFrEF, permanent atrial fibrillation, CAD, DM-2, known PAD-s/p partial amputation of right great toe presented to [facility] as a transfer from [facility] ED for septic shock in the setting of critical right lower extremity ischemia.  Significant events: 4/21>> transferred to [facility] from [facility]- found down-gangrene is right lower extremity-CT angio performed which showed concern for acute right arterial occlusion. 4/24>> transfer to [facility]. 4/27>> left TMA 5/3>> fall after getting out of bed-bleeding from TMA site-with wound dehiscence. 5/5>> bleeding from amputation stump 5/6>> recurrent bleeding from amputation stump-stopped with compressive dressings.  Significant studies: 4/21>> CT angio abdominal aorta with iliofemoral runoff: No opacification beyond mid SFA-suspicious for arterial embolism.  L1, L2, L3 and L4 vertebral bodies compression fractures.  Mildly enlarged left retroperitoneal, pelvic and inguinal lymph nodes. 4/21>> CT chest: No acute pulmonary disease. 4/21>> CT head: No acute intracranial abnormality 4/21>> CT C-spine: No acute fractures. 4/22>> chest x-ray: No active disease 4/22>> right lower extremity arterial duplex: No evidence of stenosis or occlusive disease. 4/22>> Echo: EF 20-25%, RV systolic function severely reduced  Antimicrobial therapy: Vancomycin: 4/21>>4/24 Zosyn: 4/21>> 4/24 Unasyn: 4/25>>4/28  Microbiology data: None  Procedures : 4/27>> left transmetatarsal amputation  Consults: Vascular surgery, PCCM, orthopedics, cardiology  DVT Prophylaxis : IV heparin  Subjective:  Patient in bed, appears comfortable, denies any headache, no fever, no chest pain or pressure, no shortness of  breath , no abdominal pain. No new focal weakness, some L. Stump pain.    Assessment/Plan:  Septic shock due to critical left limb ischemia with left forefoot ulceration/gangrene s/p transmetatarsal amputation on 4/27: Sepsis physiology has resolved-all antimicrobial therapy discontinued on 4/28.   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  He continued to have subsequent episodes of bleeding.  Xarelto held.  Orthopedics planning BKA on 5/11.  Acute metabolic encephalopathy: Due to above-found unresponsive at home-improved.  Anemia: Due to combination of anemia of chronic disease, iron deficiency anemia and acute blood loss anemia from TMA stump bleeding.  Follow CBC-needs outpatient endoscopic evaluation.  Acute on chronic systolic/diastolic heart failure/new RV failure: Volume status better-cardiology following.  Remains on IV diuretics-metoprolol-due to intermittent soft BP-on midodrine as well.  Chronic atrial fibrillation with RVR: Rate controlled-on metoprolol-Xarelto on hold due to TMA stump of bleeding.  If H&H remains stable for the next few days post BKA may start heparin drip with caution.  Hyponatremia: Now appears dehydrated on 03/03/2021, hold Lasix, gentle IV fluids, serum and urine electrolytes.  Lungs are clear on exam he has no orthopnea, reduced skin turgor.  Transaminitis: LFTs have essentially normalized-no longer on amiodarone.  Acute hepatitis serology negative.  History of CAD: No anginal symptoms-not on aspirin as patient on anticoagulation  DM-2 (A1c 7.7 on 4/25): CBGs at goal. Continue SSI.Resume metformin on discharge.  Recent Labs    03/03/21 1617 03/03/21 2042 03/04/21 0801  GLUCAP 158* 149* 153*    Prior history of polysubstance use: UDS 3 years back positive for amphetamine/cannabinoids.  ?  OSA/central  sleep apnea: Noncompliant with  it despite counseling, reconsult on 03/03/2021 outpatient sleep study and pulmonary follow-up to be arranged by PCP.  Goals of care: Noncompliant to medications-noncompliance to weightbearing status-very poor insight into his medical issues-poor social economic conditions-probably has end-stage CHF-unfortunately his overall long-term prognosis is very poor.  I do not think he is a candidate for aggressive therapies.  Evaluated by palliative care-DNR in place.  Will need ongoing goals of care discussion.  Nutrition Problem: Nutrition Problem: Moderate Malnutrition Etiology: chronic illness (CHF) Signs/Symptoms: mild muscle depletion,moderate muscle depletion,mild fat depletion,moderate fat depletion Interventions: MVI,Glucerna shake,Magic cup   Diet: Diet Order            Diet Carb Modified Fluid consistency: Thin; Room service appropriate? No; Fluid restriction: 1200 mL Fluid  Diet effective now                  Code Status: Full code   Family Communication:  Called Father -[name]-[phone]-updated over the phone on 5/5-called on 5/7-voicemail not set up-unable to leave a voicemail.  Called mother- [name]-[phone]-updated over the phone on 4/29.  Disposition Plan: Status is: Inpatient  Remains inpatient appropriate because:Inpatient level of care appropriate due to severity of illness   Dispo: The patient is from: Home              Anticipated d/c is to: tbd              Patient currently is not medically stable to d/c.   Difficult to place patient No   Barriers to Discharge: ongoing diuresis-left BKA planned for Wednesday.   Antimicrobial agents: Anti-infectives (From admission, onward)   Start     Dose/Rate Route Frequency Ordered Stop   02/20/21 1100  ceFAZolin (ANCEF) IVPB 2g/100 mL premix        2 g 200 mL/hr over 30 Minutes Intravenous To Short Stay 02/20/21 0754 02/20/21 1254   02/18/21 1745  Ampicillin-Sulbactam (UNASYN) 3 g in sodium  chloride 0.9 % 100 mL IVPB        3 g 200 mL/hr over 30 Minutes Intravenous Every 6 hours 02/18/21 1645 02/21/21 2359   02/18/21 1200  metroNIDAZOLE (FLAGYL) IVPB 500 mg  Status:  Discontinued        500 mg 100 mL/hr over 60 Minutes Intravenous Every 8 hours 02/18/21 1033 02/18/21 1043   02/18/21 1130  cefTRIAXone (ROCEPHIN) 2 g in sodium chloride 0.9 % 100 mL IVPB  Status:  Discontinued        2 g 200 mL/hr over 30 Minutes Intravenous Every 24 hours 02/18/21 1033 02/18/21 1043   02/15/21 0230  vancomycin (VANCOREADY) IVPB 1250 mg/250 mL  Status:  Discontinued        1,250 mg 166.7 mL/hr over 90 Minutes Intravenous Every 12 hours 02/15/21 0140 02/18/21 0735   02/15/21 0030  piperacillin-tazobactam (ZOSYN) IVPB 3.375 g  Status:  Discontinued        3.375 g 12.5 mL/hr over 240 Minutes Intravenous Every 8 hours 02/14/21 2338 02/18/21 1032      MEDICATIONS: Scheduled Meds: . vitamin C  1,000 mg Oral Daily  . Chlorhexidine Gluconate Cloth  6 each Topical Daily  . docusate sodium  100 mg Oral BID  . famotidine  20 mg Oral Daily  . feeding supplement (GLUCERNA SHAKE)  237 mL Oral BID BM  . insulin aspart  0-15 Units Subcutaneous TID WC  . insulin aspart  0-5 Units Subcutaneous QHS  . mouth rinse  15  mL Mouth Rinse BID  . metoprolol succinate  12.5 mg Oral Daily  . midodrine  10 mg Oral TID WC  . multivitamin with minerals  1 tablet Oral Daily  . pantoprazole  40 mg Oral Daily  . zinc sulfate  220 mg Oral Daily   Continuous Infusions: . sodium chloride 10 mL/hr at 02/21/21 0810   PRN Meds:.acetaminophen **OR** [DISCONTINUED] acetaminophen, albuterol, alum & mag hydroxide-simeth, bisacodyl, guaiFENesin-dextromethorphan, magnesium citrate, [DISCONTINUED] ondansetron **OR** ondansetron (ZOFRAN) IV, oxyCODONE-acetaminophen, phenol, polyethylene glycol, zolpidem   PHYSICAL EXAM:  Vitals:   03/03/21 2344 03/04/21 0335 03/04/21 0400 03/04/21 0758  BP: 104/68 (!) 82/72 101/68 107/75   Pulse: 86 90  93  Resp: 20 19  20   Temp: 98.6 F (37 C) 98.7 F (37.1 C)    TempSrc: Oral Oral  Oral  SpO2: 93% 98%  98%  Weight:  98.3 kg    Height:       Filed Weights   03/01/21 0451 03/03/21 0400 03/04/21 0335  Weight: 96.7 kg 95.3 kg 98.3 kg   Body mass index is 27.09 kg/m.   Gen Exam:  Awake Alert, No new F.N deficits, Normal affect [place].AT,PERRAL Supple Neck,No JVD, No cervical lymphadenopathy appriciated.  Symmetrical Chest wall movement, Good air movement bilaterally, CTAB RRR,No Gallops, Rubs or new Murmurs, No Parasternal Heave +ve B.Sounds, Abd Soft, No tenderness, No organomegaly appriciated, No rebound - guarding or rigidity. No Cyanosis,  L TMA stump under bandage   I have personally reviewed following labs and imaging studies  LABORATORY DATA:  Recent Labs  Lab 02/28/21 0615 03/01/21 0458 03/02/21 0032 03/03/21 0043 03/04/21 0038  WBC 5.3 4.9 5.7 5.3 5.9  HGB 10.3* 10.0* 9.5* 9.6* 9.4*  HCT 31.5* 30.2* 29.6* 29.9* 29.2*  PLT 173 194 204 235 267  MCV 84.7 83.9 85.5 86.2 85.6  MCH 27.7 27.8 27.5 27.7 27.6  MCHC 32.7 33.1 32.1 32.1 32.2  RDW 18.0* 18.1* 18.4* 18.5* 18.6*  LYMPHSABS  --   --   --   --  1.1  MONOABS  --   --   --   --  0.7  EOSABS  --   --   --   --  0.4  BASOSABS  --   --   --   --  0.1    Recent Labs  Lab 02/26/21 0238 02/26/21 1641 02/27/21 0701 02/27/21 1500 02/28/21 1541 03/01/21 0458 03/02/21 0032 03/03/21 0043 03/03/21 0905 03/04/21 0038  NA 125*  --  127*   < > 129* 130* 129* 129*  --  131*  K 4.4  --  3.6   < > 4.1 4.0 4.1 4.4  --  4.0  CL 90*  --  92*   < > 92* 92* 90* 93*  --  94*  CO2 22  --  30   < > 31 30 31 29   --  30  GLUCOSE 119*  --  139*   < > 139* 117* 134* 107*  --  120*  BUN 30*  --  24*   < > 17 19 19  22*  --  18  CREATININE 1.42*  --  1.26*   < > 1.21 1.12 1.16 1.19  --  1.22  CALCIUM 8.3*  --  8.1*   < > 7.6* 8.2* 8.0* 8.0*  --  8.3*  AST 46*  --  33  --   --  31  --   --   --  27  ALT 120*   --  87*  --   --  64*  --   --   --  40  ALKPHOS 120  --  102  --   --  100  --   --   --  92  BILITOT 2.2*  --  2.5*  --   --  1.4*  --   --   --  1.0  ALBUMIN 2.4*  --  2.2*  --   --  2.3*  --   --   --  2.3*  MG  --   --   --   --   --   --   --   --   --  1.7  BNP  --  1,439.4*  --   --   --   --   --   --  1,086.7* 1,147.2*   < > = values in this interval not displayed.     MICROBIOLOGY: Recent Results (from the past 240 hour(s))  SARS CORONAVIRUS 2 (TAT 6-24 HRS) Nasopharyngeal Nasopharyngeal Swab     Status: None   Collection Time: 02/25/21 12:53 PM   Specimen: Nasopharyngeal Swab  Result Value Ref Range Status   SARS Coronavirus 2 NEGATIVE NEGATIVE Final    Comment: (NOTE) SARS-CoV-2 target nucleic acids are NOT DETECTED.  The SARS-CoV-2 RNA is generally detectable in upper and lower respiratory specimens during the acute phase of infection. Negative results do not preclude SARS-CoV-2 infection, do not rule out co-infections with other pathogens, and should not be used as the sole basis for treatment or other patient management decisions. Negative results must be combined with clinical observations, patient history, and epidemiological information. The expected result is Negative.  Fact Sheet for Patients: HairSlick.no  Fact Sheet for Healthcare Providers: quierodirigir.com  This test is not yet approved or cleared by the [place] FDA and  has been authorized for detection and/or diagnosis of SARS-CoV-2 by FDA under an Emergency Use Authorization (EUA). This EUA will remain  in effect (meaning this test can be used) for the duration of the COVID-19 declaration under Se ction 564(b)(1) of the Act, 21 U.S.C. section 360bbb-3(b)(1), unless the authorization is terminated or revoked sooner.  Performed at [facility], [address], [place], [place] [address]     RADIOLOGY STUDIES/RESULTS: DG Chest  Port 1 View  Result Date: 03/04/2021 CLINICAL DATA:  Shortness of breath EXAM: PORTABLE CHEST 1 VIEW COMPARISON:  03/03/2021 FINDINGS: No new consolidation or edema. No pleural effusion. No pneumothorax. Stable cardiomegaly. IMPRESSION: No acute process in the chest.  Cardiomegaly. Electronically Signed   By: Guadlupe Spanish M.D.   On: 03/04/2021 08:23   DG Chest Port 1 View  Result Date: 03/03/2021 CLINICAL DATA:  Shortness of breath EXAM: PORTABLE CHEST 1 VIEW COMPARISON:  Chest radiograph dated 02/26/2021. FINDINGS: The heart is enlarged. The lungs are clear. Degenerative changes are seen in the spine. Chronic right-sided rib fractures and a chronic left clavicle deformity are redemonstrated. IMPRESSION: No active disease.  Cardiomegaly. Electronically Signed   By: Romona Curls M.D.   On: 03/03/2021 12:32     LOS: 18 days   Signature  Susa Raring M.D on 03/04/2021 at 10:21 AM   -  To page go to [email or web address]

## 2021-03-04 NOTE — Progress Notes (Addendum)
Progress Note  Patient Name: Fernando Maddox Date of Encounter: 03/04/2021  [facility] Cardiologist: Garwin Brothers, MD   Subjective   No chest pain or SOB, some foot pain.  Inpatient Medications    Scheduled Meds: . vitamin C  1,000 mg Oral Daily  . Chlorhexidine Gluconate Cloth  6 each Topical Daily  . docusate sodium  100 mg Oral BID  . famotidine  20 mg Oral Daily  . feeding supplement (GLUCERNA SHAKE)  237 mL Oral BID BM  . insulin aspart  0-15 Units Subcutaneous TID WC  . insulin aspart  0-5 Units Subcutaneous QHS  . mouth rinse  15 mL Mouth Rinse BID  . metoprolol succinate  12.5 mg Oral Daily  . midodrine  10 mg Oral TID WC  . multivitamin with minerals  1 tablet Oral Daily  . pantoprazole  40 mg Oral Daily  . potassium chloride  40 mEq Oral Daily  . zinc sulfate  220 mg Oral Daily   Continuous Infusions: . sodium chloride 10 mL/hr at 02/21/21 0810   PRN Meds: acetaminophen **OR** [DISCONTINUED] acetaminophen, albuterol, alum & mag hydroxide-simeth, bisacodyl, guaiFENesin-dextromethorphan, magnesium citrate, [DISCONTINUED] ondansetron **OR** ondansetron (ZOFRAN) IV, oxyCODONE-acetaminophen, phenol, polyethylene glycol, zolpidem   Vital Signs    Vitals:   03/03/21 2344 03/04/21 0335 03/04/21 0400 03/04/21 0758  BP: 104/68 (!) 82/72 101/68 107/75  Pulse: 86 90  93  Resp: 20 19  20   Temp: 98.6 F (37 C) 98.7 F (37.1 C)    TempSrc: Oral Oral  Oral  SpO2: 93% 98%  98%  Weight:  98.3 kg    Height:        Intake/Output Summary (Last 24 hours) at 03/04/2021 0907 Last data filed at 03/04/2021 0803 Gross per 24 hour  Intake 1020.38 ml  Output 1925 ml  Net -904.62 ml   Last 3 Weights 03/04/2021 03/03/2021 03/01/2021  Weight (lbs) 216 lb 11.4 oz 210 lb 1.6 oz 213 lb 3 oz  Weight (kg) 98.3 kg 95.3 kg 96.7 kg      Telemetry    A fib with occ PVC - Personally Reviewed  ECG    No new - Personally Reviewed  Physical Exam   GEN: No acute distress.  Falls to  sleep very easily, while I am talking.  Neck: + JVD Cardiac: irreg irreg, no murmurs, rubs, or gallops.  Respiratory: diminished to auscultation bilaterally. GI: Soft, nontender, non-distended  MS: mild Lt leg edema, no edema on rt; No deformity. Neuro:  Nonfocal  Psych: Normal affect   Labs    High Sensitivity Troponin:  No results for input(s): TROPONINIHS in the last 720 hours.    Chemistry Recent Labs  Lab 02/27/21 0701 02/27/21 1500 03/01/21 0458 03/02/21 0032 03/03/21 0043 03/04/21 0038  NA 127*   < > 130* 129* 129* 131*  K 3.6   < > 4.0 4.1 4.4 4.0  CL 92*   < > 92* 90* 93* 94*  CO2 30   < > 30 31 29 30   GLUCOSE 139*   < > 117* 134* 107* 120*  BUN 24*   < > 19 19 22* 18  CREATININE 1.26*   < > 1.12 1.16 1.19 1.22  CALCIUM 8.1*   < > 8.2* 8.0* 8.0* 8.3*  PROT 6.0*  --  6.3*  --   --  6.2*  ALBUMIN 2.2*  --  2.3*  --   --  2.3*  AST 33  --  31  --   --  27  ALT 87*  --  64*  --   --  40  ALKPHOS 102  --  100  --   --  92  BILITOT 2.5*  --  1.4*  --   --  1.0  GFRNONAA >60   < > >60 >60 >60 >60  ANIONGAP 5   < > 8 8 7 7    < > = values in this interval not displayed.     Hematology Recent Labs  Lab 03/02/21 0032 03/03/21 0043 03/04/21 0038  WBC 5.7 5.3 5.9  RBC 3.46* 3.47* 3.41*  HGB 9.5* 9.6* 9.4*  HCT 29.6* 29.9* 29.2*  MCV 85.5 86.2 85.6  MCH 27.5 27.7 27.6  MCHC 32.1 32.1 32.2  RDW 18.4* 18.5* 18.6*  PLT 204 235 267    BNP Recent Labs  Lab 02/26/21 1641 03/03/21 0905 03/04/21 0038  BNP 1,439.4* 1,086.7* 1,147.2*     DDimer No results for input(s): DDIMER in the last 168 hours.   Radiology    DG Chest Port 1 View  Result Date: 03/04/2021 CLINICAL DATA:  Shortness of breath EXAM: PORTABLE CHEST 1 VIEW COMPARISON:  03/03/2021 FINDINGS: No new consolidation or edema. No pleural effusion. No pneumothorax. Stable cardiomegaly. IMPRESSION: No acute process in the chest.  Cardiomegaly. Electronically Signed   By: 05/03/2021 M.D.   On:  03/04/2021 08:23   DG Chest Port 1 View  Result Date: 03/03/2021 CLINICAL DATA:  Shortness of breath EXAM: PORTABLE CHEST 1 VIEW COMPARISON:  Chest radiograph dated 02/26/2021. FINDINGS: The heart is enlarged. The lungs are clear. Degenerative changes are seen in the spine. Chronic right-sided rib fractures and a chronic left clavicle deformity are redemonstrated. IMPRESSION: No active disease.  Cardiomegaly. Electronically Signed   By: 04/28/2021 M.D.   On: 03/03/2021 12:32    Cardiac Studies   Echocardiogram  1. Left ventricular ejection fraction, by estimation, is 20 to 25%. The  left ventricle has severely decreased function. The left ventricle  demonstrates global hypokinesis. The left ventricular internal cavity size  was moderately dilated. Left  ventricular diastolic function could not be evaluated. There is the  interventricular septum is flattened in systole and diastole, consistent  with right ventricular pressure and volume overload.  2. Right ventricular systolic function is severely reduced. The right  ventricular size is moderately enlarged.  3. Left atrial size was moderately dilated.  4. Right atrial size was moderately dilated.  5. The mitral valve is normal in structure. Mild to moderate mitral valve  regurgitation.  6. Tricuspid valve regurgitation is mild to moderate.  7. The aortic valve is tricuspid. There is mild calcification of the  aortic valve. Aortic valve regurgitation is not visualized.  8. Aortic dilatation noted. There is borderline dilatation of the  ascending aorta, measuring 40 mm.  9. The inferior vena cava is dilated in size with <50% respiratory  variability, suggesting right atrial pressure of 15 mmHg.     Patient Profile     60 y.o. male with history ofCAD, permanent Afib, HFrEF, HTN, HLD, DMII, CVA, PAD with nonhealing foot wounds, OSA on CPAP, and tobacco abuse who initially presented to [facility] after being found down for 2  days. Was found to have LLE gangrene with concern for right arterial occlusion prompting transfer to [facility]. Formal vascular studies with normal ABI and negative LE dopplers for DVT. He was recommended for left transmetatarsal amputation.  Cardiology was consulted on  this admission for HFrEF and chronic Afib.Echocardiogram 4/22 showed EF of 20 to 25% with global hypokinesis,RV severely reduced, with moderate enlargement, LA/RAmoderately dilated,mild to moderate tricuspid regurgitation.No LV thrombus noted.  Assessment & Plan    Acute on chronic combined systolic and diastolic heart failure Net negative last 24 hours 704.  Neg 15400 since admit --wt down from 105.3 Kg at pk today 98.3 Kg Unable to titrate meds with hypotension. Placed on midodrine 10 mg TID.  For further surgery Monday.    At risk secondary to severe cardiomyopathy.  However, surgery is necessary and risk is acceptable in this situation.   --BNP 1147  Permanent Atrial Fibrillation: CHADs-vasc 6.   Was bleeding from surgical site.  Xarelto held.   Rate controlled.  Resume Xarelto when able post op.    Hyponatremia: Na is low slightly improved from 129 last 2 days now 131 over the past two days.  Nursing is trying to restrict PO free water intake and reports that the patient is verbally abusive over this issue.   HLD: Holding statin secondary to recent elevated liver enzymes.    Septic shock in setting of gangrenous LLE per vascular and ortho.   --left transmetatarsal amputation.  Patient is having progressive wound dehiscence.   Plan for left  transtibial amputation 03/06/21    Polysubstance abuse  UDS and+for THC and amphetamines   Tobacco use- cessation advised.  For questions or updates, please contact [facility] Please consult [email or web address] for contact info under        Signed, Nada Boozer, NP  03/04/2021, 9:07 AM    Personally seen and examined. Agree with APP above with the following  comments: Briefly 60 yo M with a history of HFrEF and RV Failure Permanent AF, Hypotension, HLD.Polysbustance abuse.   Limited exam- patient is very about his leg and was not amenable to full exam Irregularly irregular rhythm - when recovered from bleed, DOAC restart would be reocmmended - Continue low dose Toprol XL; will not tolerate further GDMT presently and is on midodrine - continue fluid restriction and will likely need return of diuresis - needs polysubstance cessation at DC -  Post surgically; will re-evaluate for more aggressive diuressis  Riley Lam, MD Cardiologist [facility]  [address] [place], [address] [place], [place] [address] [phone]  12:06 PM

## 2021-03-05 DIAGNOSIS — Z515 Encounter for palliative care: Secondary | ICD-10-CM | POA: Diagnosis not present

## 2021-03-05 DIAGNOSIS — Z7189 Other specified counseling: Secondary | ICD-10-CM | POA: Diagnosis not present

## 2021-03-05 DIAGNOSIS — L97524 Non-pressure chronic ulcer of other part of left foot with necrosis of bone: Secondary | ICD-10-CM | POA: Diagnosis not present

## 2021-03-05 LAB — GLUCOSE, CAPILLARY
Glucose-Capillary: 132 mg/dL — ABNORMAL HIGH (ref 70–99)
Glucose-Capillary: 137 mg/dL — ABNORMAL HIGH (ref 70–99)
Glucose-Capillary: 138 mg/dL — ABNORMAL HIGH (ref 70–99)
Glucose-Capillary: 149 mg/dL — ABNORMAL HIGH (ref 70–99)

## 2021-03-05 LAB — CBC WITH DIFFERENTIAL/PLATELET
Abs Immature Granulocytes: 0.05 10*3/uL (ref 0.00–0.07)
Basophils Absolute: 0.1 10*3/uL (ref 0.0–0.1)
Basophils Relative: 2 %
Eosinophils Absolute: 0.5 10*3/uL (ref 0.0–0.5)
Eosinophils Relative: 6 %
HCT: 31.1 % — ABNORMAL LOW (ref 39.0–52.0)
Hemoglobin: 9.9 g/dL — ABNORMAL LOW (ref 13.0–17.0)
Immature Granulocytes: 1 %
Lymphocytes Relative: 20 %
Lymphs Abs: 1.5 10*3/uL (ref 0.7–4.0)
MCH: 27.6 pg (ref 26.0–34.0)
MCHC: 31.8 g/dL (ref 30.0–36.0)
MCV: 86.6 fL (ref 80.0–100.0)
Monocytes Absolute: 1 10*3/uL (ref 0.1–1.0)
Monocytes Relative: 13 %
Neutro Abs: 4.4 10*3/uL (ref 1.7–7.7)
Neutrophils Relative %: 58 %
Platelets: 231 10*3/uL (ref 150–400)
RBC: 3.59 MIL/uL — ABNORMAL LOW (ref 4.22–5.81)
RDW: 19.4 % — ABNORMAL HIGH (ref 11.5–15.5)
WBC: 7.5 10*3/uL (ref 4.0–10.5)
nRBC: 0.3 % — ABNORMAL HIGH (ref 0.0–0.2)

## 2021-03-05 LAB — COMPREHENSIVE METABOLIC PANEL
ALT: 36 U/L (ref 0–44)
AST: 26 U/L (ref 15–41)
Albumin: 2.3 g/dL — ABNORMAL LOW (ref 3.5–5.0)
Alkaline Phosphatase: 99 U/L (ref 38–126)
Anion gap: 8 (ref 5–15)
BUN: 15 mg/dL (ref 6–20)
CO2: 27 mmol/L (ref 22–32)
Calcium: 8.3 mg/dL — ABNORMAL LOW (ref 8.9–10.3)
Chloride: 95 mmol/L — ABNORMAL LOW (ref 98–111)
Creatinine, Ser: 0.93 mg/dL (ref 0.61–1.24)
GFR, Estimated: >60 mL/min (ref >60)
Glucose, Bld: 81 mg/dL (ref 70–99)
Potassium: 4 mmol/L (ref 3.5–5.1)
Sodium: 130 mmol/L — ABNORMAL LOW (ref 135–145)
Total Bilirubin: 1.1 mg/dL (ref 0.3–1.2)
Total Protein: 6.3 g/dL — ABNORMAL LOW (ref 6.5–8.1)

## 2021-03-05 LAB — BRAIN NATRIURETIC PEPTIDE: B Natriuretic Peptide: 946.5 pg/mL — ABNORMAL HIGH (ref 0.0–100.0)

## 2021-03-05 LAB — MAGNESIUM: Magnesium: 1.8 mg/dL (ref 1.7–2.4)

## 2021-03-05 MED ORDER — ALPRAZOLAM 0.5 MG PO TABS
0.5000 mg | ORAL_TABLET | Freq: Two times a day (BID) | ORAL | Status: DC | PRN
  Administered 2021-03-05 – 2021-03-08 (×4): 0.5 mg via ORAL
  Filled 2021-03-05 (×4): qty 1

## 2021-03-05 NOTE — Progress Notes (Addendum)
Progress Note  Patient Name: Fernando Maddox Date of Encounter: 03/05/2021  [facility] Cardiologist: Garwin Brothers, MD   Subjective   Nurse reported patient has not been compliant with fluid restriction. Patient says he feels a little SOB, but not acutely worse than before. No chest pain. Has some foot pain. Bps improved but still soft.  Inpatient Medications    Scheduled Meds: . vitamin C  1,000 mg Oral Daily  . Chlorhexidine Gluconate Cloth  6 each Topical Daily  . docusate sodium  100 mg Oral BID  . famotidine  20 mg Oral Daily  . feeding supplement (GLUCERNA SHAKE)  237 mL Oral BID BM  . insulin aspart  0-15 Units Subcutaneous TID WC  . insulin aspart  0-5 Units Subcutaneous QHS  . mouth rinse  15 mL Mouth Rinse BID  . metoprolol succinate  12.5 mg Oral Daily  . midodrine  10 mg Oral TID WC  . multivitamin with minerals  1 tablet Oral Daily  . pantoprazole  40 mg Oral Daily   Continuous Infusions: . sodium chloride 10 mL/hr at 02/21/21 0810   PRN Meds: acetaminophen **OR** [DISCONTINUED] acetaminophen, albuterol, alum & mag hydroxide-simeth, bisacodyl, guaiFENesin-dextromethorphan, magnesium citrate, [DISCONTINUED] ondansetron **OR** ondansetron (ZOFRAN) IV, oxyCODONE-acetaminophen, phenol, polyethylene glycol, zolpidem   Vital Signs    Vitals:   03/04/21 1935 03/04/21 2345 03/05/21 0344 03/05/21 0752  BP: 113/78 (!) 107/92 114/77 97/74  Pulse: 85 83 93 92  Resp: 16 20 19 20   Temp: 98 F (36.7 C) 97.6 F (36.4 C) 98.6 F (37 C) 98.8 F (37.1 C)  TempSrc: Oral Oral Oral Oral  SpO2: 98% 99% 94% 97%  Weight:      Height:        Intake/Output Summary (Last 24 hours) at 03/05/2021 0837 Last data filed at 03/05/2021 0756 Gross per 24 hour  Intake 720 ml  Output 1350 ml  Net -630 ml   Last 3 Weights 03/04/2021 03/03/2021 03/01/2021  Weight (lbs) 216 lb 11.4 oz 210 lb 1.6 oz 213 lb 3 oz  Weight (kg) 98.3 kg 95.3 kg 96.7 kg      Telemetry     Afib, HR 80s,  PVCs, 5 beats NSVT- Personally Reviewed  ECG    No new - Personally Reviewed  Physical Exam   GEN: No acute distress.   Neck: minimal JVD Cardiac: Irreg IRreg, no murmurs, rubs, or gallops.  Respiratory: minimal crackles at bases GI: Soft, nontender, non-distended  MS: +LLE edema;  Neuro:  Nonfocal  Psych: Normal affect   Labs    High Sensitivity Troponin:  No results for input(s): TROPONINIHS in the last 720 hours.    Chemistry Recent Labs  Lab 03/01/21 0458 03/02/21 0032 03/03/21 0043 03/04/21 0038 03/05/21 0030  NA 130*   < > 129* 131* 130*  K 4.0   < > 4.4 4.0 4.0  CL 92*   < > 93* 94* 95*  CO2 30   < > 29 30 27   GLUCOSE 117*   < > 107* 120* 81  BUN 19   < > 22* 18 15  CREATININE 1.12   < > 1.19 1.22 0.93  CALCIUM 8.2*   < > 8.0* 8.3* 8.3*  PROT 6.3*  --   --  6.2* 6.3*  ALBUMIN 2.3*  --   --  2.3* 2.3*  AST 31  --   --  27 26  ALT 64*  --   --  40  36  ALKPHOS 100  --   --  92 99  BILITOT 1.4*  --   --  1.0 1.1  GFRNONAA >60   < > >60 >60 >60  ANIONGAP 8   < > 7 7 8    < > = values in this interval not displayed.     Hematology Recent Labs  Lab 03/03/21 0043 03/04/21 0038 03/05/21 0030  WBC 5.3 5.9 7.5  RBC 3.47* 3.41* 3.59*  HGB 9.6* 9.4* 9.9*  HCT 29.9* 29.2* 31.1*  MCV 86.2 85.6 86.6  MCH 27.7 27.6 27.6  MCHC 32.1 32.2 31.8  RDW 18.5* 18.6* 19.4*  PLT 235 267 231    BNP Recent Labs  Lab 03/03/21 0905 03/04/21 0038 03/05/21 0030  BNP 1,086.7* 1,147.2* 946.5*     DDimer No results for input(s): DDIMER in the last 168 hours.   Radiology    DG Chest Port 1 View  Result Date: 03/04/2021 CLINICAL DATA:  Shortness of breath EXAM: PORTABLE CHEST 1 VIEW COMPARISON:  03/03/2021 FINDINGS: No new consolidation or edema. No pleural effusion. No pneumothorax. Stable cardiomegaly. IMPRESSION: No acute process in the chest.  Cardiomegaly. Electronically Signed   By: 05/03/2021 M.D.   On: 03/04/2021 08:23   DG Chest Port 1 View  Result Date:  03/03/2021 CLINICAL DATA:  Shortness of breath EXAM: PORTABLE CHEST 1 VIEW COMPARISON:  Chest radiograph dated 02/26/2021. FINDINGS: The heart is enlarged. The lungs are clear. Degenerative changes are seen in the spine. Chronic right-sided rib fractures and a chronic left clavicle deformity are redemonstrated. IMPRESSION: No active disease.  Cardiomegaly. Electronically Signed   By: 04/28/2021 M.D.   On: 03/03/2021 12:32    Cardiac Studies   Echocardiogram   1. Left ventricular ejection fraction, by estimation, is 20 to 25%. The  left ventricle has severely decreased function. The left ventricle  demonstrates global hypokinesis. The left ventricular internal cavity size  was moderately dilated. Left  ventricular diastolic function could not be evaluated. There is the  interventricular septum is flattened in systole and diastole, consistent  with right ventricular pressure and volume overload.   2. Right ventricular systolic function is severely reduced. The right  ventricular size is moderately enlarged.   3. Left atrial size was moderately dilated.   4. Right atrial size was moderately dilated.   5. The mitral valve is normal in structure. Mild to moderate mitral valve  regurgitation.   6. Tricuspid valve regurgitation is mild to moderate.   7. The aortic valve is tricuspid. There is mild calcification of the  aortic valve. Aortic valve regurgitation is not visualized.   8. Aortic dilatation noted. There is borderline dilatation of the  ascending aorta, measuring 40 mm.   9. The inferior vena cava is dilated in size with <50% respiratory  variability, suggesting right atrial pressure of 15 mmHg.     Patient Profile     60 y.o. male with pmh of CAD, permanent Afib, HFrEF, HTN, HLD, DM2, CVA, PAD with nonhealing foot wounds, OSA on CPAP, and tobacco abuse who presented to [facility] after being found down for 2 hours. He was found to have LLE gangrene with concern for right  arterial occlusion/septic shock prompting transfer to [facility]. Vascular studies showed normal ABI, negative LE dopplers for DVT. He was recommended transmetatarsal amputation.  Cardiology was consulted for HFrEF and chronic Afib. Echo 4/22 showed EF 20-25%, global hypokinesis, severely reduced RV function with mod enlargement, LA/RA moderately dilated,  mild to mod tricuspid regurgitation. No LV thrombus.  Assessment & Plan    Acute on chronic combined systolic and diastolic heart failure - s/p IV lasix, last does 5/7. Not on oral lasix at this time - hypotensive and started on Midodrine 10mg  TID - Toprol XL 12.5 mg - through care will likely need lasix 40 mg PO Daily restarted  Permanent Afib Anemia concerning for bleed - CHADSVASC at least 6 - post operatively can consider heparin and; if H&H Stable, can restart DOAC  Septic shock Gangrenous LLE - sepsis resolved - s/p left transmetatarsal amputation - plan for left transtibial amputation 5/11  Hyponatremia - NA improving, today 130  HLD - statin held for elevated LFTs  Polysubstance abuse - UDS with THC and amphetamines  Tobacco use - cessation advised   For questions or updates, please contact [facility] Please consult [email or web address] for contact info under        Signed, Cadence 7/11, PA-C  03/05/2021, 8:37 AM    Personally seen and examined. 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  Poor Prognosis  05/06/21, MD Cardiologist [facility]  [address] [place], [address] [place], [place] [phone]  10:52 AM

## 2021-03-05 NOTE — Progress Notes (Signed)
The pt has been calling out of room or on call light every 5-10 min today demanding fluids. I have educated the pt on why he is on a strict fluid restriction. The PA from cardiology came into the pt's room as I was explaining this and I asked her to reinforce. The pt has cussed me out, flipped me off and other staff members because we are following Dr orders. He is taking 02 off and being very non compliant. Any time I go into the room he starts yelling at me. His behavior is very abusive to staff.

## 2021-03-05 NOTE — Progress Notes (Signed)
PT Cancellation Note  Patient Details Name: Fernando Maddox MRN: [id number] DOB: 01-23-1961   Cancelled Treatment:    Reason Eval/Treat Not Completed: Other (comment) discussed case with RN- patient being verbally aggressive towards staff and non-compliant with staff/recommendations. Unlikely to participate in productive PT session due to behaviors. Will hold for now, as session would be extremely limited (bed level only) due to ongoing concerns of TMA bleeding anyway. Having BKA tomorrow- will f/u after this procedure.    Madelaine Etienne, DPT, PN1   Supplemental Physical Therapist [facility]    Pager [phone] Acute Rehab Office [phone]

## 2021-03-05 NOTE — Progress Notes (Signed)
Palliative:  HPI: 60 y.o.malewith past medical history of diabetes, CAD, heart failure EF 20-25%, severe RV failure, hypertension, stroke, atrial fibrillation, polysubstance abuseadmitted on 4/21/2022with septic shock with shortness of breath found down by family at his home.Found to have left lower leg gangrene with plans for left metatarsal amputation.  I met again today with Dominica Severin. No family at bedside. I have noted RN notes where Aking has been uncooperative and inappropriate. Alyx was pleasant to me. We discussed plans now for BKA and Asher is tearful that he will loose his leg. However, Onyx shares that he plans to proceed with amputation "because I have too, I don't have a choice." Tedford continues to want to pursue options to prolong his life. I did have a frank conversation with Jaquille about him making choices to help himself and being kinder to the nursing staff and then they will be kinder and more helpful to him.   All questions/concerns addressed. Emotional support provided.   Exam: Alert, mostly oriented although with some confusion. Tearful about upcoming amputation. Breathing regular, unlabored. Abd flat. I did not assess L TMA.   Plan: - L BKA for tomorrow 03/06/21.  - Goals clear to continue aggressive care outside DNR.   15 min  Vinie Sill, NP Palliative Medicine Team Pager [phone] (Please see [email or web address] for schedule) Team Phone [phone]    Greater than 50%  of this time was spent counseling and coordinating care related to the above assessment and plan

## 2021-03-05 NOTE — Progress Notes (Signed)
PROGRESS NOTE        PATIENT DETAILS Name: Fernando Maddox Age: 60 y.o. Sex: male Date of Birth: 26-Nov-1960 Admit Date: 02/14/2021 Admitting Physician Charlott Holler, MD PCP:Van Domenic Schwab, MD  Brief Narrative: Patient is a 60 y.o. male with history of HFrEF, permanent atrial fibrillation, CAD, DM-2, known PAD-s/p partial amputation of right great toe presented to [facility] as a transfer from [facility] ED for septic shock in the setting of critical right lower extremity ischemia.  Significant events: 4/21>> transferred to [facility] from [facility]- found down-gangrene is right lower extremity-CT angio performed which showed concern for acute right arterial occlusion. 4/24>> transfer to [facility]. 4/27>> left TMA 5/3>> fall after getting out of bed-bleeding from TMA site-with wound dehiscence. 5/5>> bleeding from amputation stump 5/6>> recurrent bleeding from amputation stump-stopped with compressive dressings.  Significant studies: 4/21>> CT angio abdominal aorta with iliofemoral runoff: No opacification beyond mid SFA-suspicious for arterial embolism.  L1, L2, L3 and L4 vertebral bodies compression fractures.  Mildly enlarged left retroperitoneal, pelvic and inguinal lymph nodes. 4/21>> CT chest: No acute pulmonary disease. 4/21>> CT head: No acute intracranial abnormality 4/21>> CT C-spine: No acute fractures. 4/22>> chest x-ray: No active disease 4/22>> right lower extremity arterial duplex: No evidence of stenosis or occlusive disease. 4/22>> Echo: EF 20-25%, RV systolic function severely reduced  Antimicrobial therapy: Vancomycin: 4/21>>4/24 Zosyn: 4/21>> 4/24 Unasyn: 4/25>>4/28  Microbiology data: None  Procedures : 4/27>> left transmetatarsal amputation  Consults: Vascular surgery, PCCM, orthopedics, cardiology  DVT Prophylaxis : IV heparin  Subjective:  Patient in bed, appears comfortable, denies any headache, no fever, no chest pain or pressure, no shortness of  breath , no abdominal pain. No new focal weakness.   Assessment/Plan:  Septic shock due to critical left limb ischemia with left forefoot ulceration/gangrene s/p transmetatarsal amputation on 4/27: Sepsis physiology has resolved-all antimicrobial therapy discontinued on 4/28.   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  He continued to have subsequent episodes of bleeding.  Xarelto held.  Orthopedics planning BKA on 5/11.  Acute metabolic encephalopathy: Due to above-found unresponsive at home-improved.  Anemia: Due to combination of anemia of chronic disease, iron deficiency anemia and acute blood loss anemia from TMA stump bleeding.  Follow CBC-needs outpatient endoscopic evaluation.  Acute on chronic systolic/diastolic heart failure/new RV failure: Volume status better-cardiology following.  Currently compensated on low-dose beta-blocker.  As needed diuretics as needed.  Chronic atrial fibrillation with RVR: Rate controlled-on metoprolol-Xarelto on hold due to TMA stump of bleeding.  If H&H remains stable for the next few days post BKA may start heparin drip with caution.  Hyponatremia: Now appears dehydrated on 03/03/2021, hold Lasix, gentle IV fluids, serum and urine electrolytes.  Lungs are clear on exam he has no orthopnea, reduced skin turgor.  Transaminitis: LFTs have essentially normalized-no longer on amiodarone.  Acute hepatitis serology negative.  History of CAD: No anginal symptoms-not on aspirin as patient on anticoagulation  DM-2 (A1c 7.7 on 4/25): CBGs at goal. Continue SSI.Resume metformin on discharge.  Recent Labs    03/04/21 1157 03/04/21 1734 03/04/21 2043  GLUCAP 147* 134* 359*    Prior history of polysubstance use: UDS 3 years back positive for amphetamine/cannabinoids.  ?  OSA/central sleep apnea: Noncompliant with it  despite counseling,  reconsult on 03/03/2021 outpatient sleep study and pulmonary follow-up to be arranged by PCP.  Goals of care: Noncompliant to medications-noncompliance to weightbearing status-very poor insight into his medical issues-poor social economic conditions-probably has end-stage CHF-unfortunately his overall long-term prognosis is very poor.  I do not think he is a candidate for aggressive therapies.  Evaluated by palliative care-DNR in place.  Will need ongoing goals of care discussion.  Nutrition Problem: Nutrition Problem: Moderate Malnutrition Etiology: chronic illness (CHF) Signs/Symptoms: mild muscle depletion,moderate muscle depletion,mild fat depletion,moderate fat depletion Interventions: MVI,Glucerna shake,Magic cup   Diet: Diet Order            Diet Carb Modified Fluid consistency: Thin; Room service appropriate? No; Fluid restriction: 1200 mL Fluid  Diet effective now                  Code Status: Full code   Family Communication:  Called Father - [name]-updated over the phone on 5/5-called on 5/7-voicemail not set up-unable to leave a voicemail, message left on 03/05/21 @ 9.16 am.  Called mother- [name]-[phone] -updated over the phone on 4/29.  Disposition Plan: Status is: Inpatient  Remains inpatient appropriate because:Inpatient level of care appropriate due to severity of illness   Dispo: The patient is from: Home              Anticipated d/c is to: tbd              Patient currently is not medically stable to d/c.   Difficult to place patient No   Barriers to Discharge: ongoing diuresis-left BKA planned for Wednesday.   Antimicrobial agents: Anti-infectives (From admission, onward)   Start     Dose/Rate Route Frequency Ordered Stop   02/20/21 1100  ceFAZolin (ANCEF) IVPB 2g/100 mL premix        2 g 200 mL/hr over 30 Minutes Intravenous To Short Stay 02/20/21 0754 02/20/21 1254   02/18/21 1745  Ampicillin-Sulbactam (UNASYN) 3 g in  sodium chloride 0.9 % 100 mL IVPB        3 g 200 mL/hr over 30 Minutes Intravenous Every 6 hours 02/18/21 1645 02/21/21 2359   02/18/21 1200  metroNIDAZOLE (FLAGYL) IVPB 500 mg  Status:  Discontinued        500 mg 100 mL/hr over 60 Minutes Intravenous Every 8 hours 02/18/21 1033 02/18/21 1043   02/18/21 1130  cefTRIAXone (ROCEPHIN) 2 g in sodium chloride 0.9 % 100 mL IVPB  Status:  Discontinued        2 g 200 mL/hr over 30 Minutes Intravenous Every 24 hours 02/18/21 1033 02/18/21 1043   02/15/21 0230  vancomycin (VANCOREADY) IVPB 1250 mg/250 mL  Status:  Discontinued        1,250 mg 166.7 mL/hr over 90 Minutes Intravenous Every 12 hours 02/15/21 0140 02/18/21 0735   02/15/21 0030  piperacillin-tazobactam (ZOSYN) IVPB 3.375 g  Status:  Discontinued        3.375 g 12.5 mL/hr over 240 Minutes Intravenous Every 8 hours 02/14/21 2338 02/18/21 1032      MEDICATIONS: Scheduled Meds: . vitamin C  1,000 mg Oral Daily  . Chlorhexidine Gluconate Cloth  6 each Topical Daily  . docusate sodium  100 mg Oral BID  . famotidine  20 mg Oral Daily  . feeding supplement (GLUCERNA SHAKE)  237 mL Oral BID BM  . insulin aspart  0-15 Units Subcutaneous TID WC  . insulin aspart  0-5 Units Subcutaneous QHS  .  mouth rinse  15 mL Mouth Rinse BID  . metoprolol succinate  12.5 mg Oral Daily  . midodrine  10 mg Oral TID WC  . multivitamin with minerals  1 tablet Oral Daily  . pantoprazole  40 mg Oral Daily   Continuous Infusions: . sodium chloride 10 mL/hr at 02/21/21 0810   PRN Meds:.acetaminophen **OR** [DISCONTINUED] acetaminophen, albuterol, alum & mag hydroxide-simeth, bisacodyl, guaiFENesin-dextromethorphan, magnesium citrate, [DISCONTINUED] ondansetron **OR** ondansetron (ZOFRAN) IV, oxyCODONE-acetaminophen, phenol, polyethylene glycol, zolpidem   PHYSICAL EXAM:  Vitals:   03/04/21 1935 03/04/21 2345 03/05/21 0344 03/05/21 0752  BP: 113/78 (!) 107/92 114/77 97/74  Pulse: 85 83 93 92  Resp: 16 20  19 20   Temp: 98 F (36.7 C) 97.6 F (36.4 C) 98.6 F (37 C) 98.8 F (37.1 C)  TempSrc: Oral Oral Oral Oral  SpO2: 98% 99% 94% 97%  Weight:      Height:       Filed Weights   03/01/21 0451 03/03/21 0400 03/04/21 0335  Weight: 96.7 kg 95.3 kg 98.3 kg   Body mass index is 27.09 kg/m.   Gen Exam:  Awake Alert, No new F.N deficits, Normal affect [place].AT,PERRAL Supple Neck,No JVD, No cervical lymphadenopathy appriciated.  Symmetrical Chest wall movement, Good air movement bilaterally, CTAB RRR,No Gallops, Rubs or new Murmurs, No Parasternal Heave +ve B.Sounds, Abd Soft, No tenderness, No organomegaly appriciated, No rebound - guarding or rigidity. No Cyanosis,  L TMA stump under bandage   I have personally reviewed following labs and imaging studies  LABORATORY DATA:  Recent Labs  Lab 03/01/21 0458 03/02/21 0032 03/03/21 0043 03/04/21 0038 03/05/21 0030  WBC 4.9 5.7 5.3 5.9 7.5  HGB 10.0* 9.5* 9.6* 9.4* 9.9*  HCT 30.2* 29.6* 29.9* 29.2* 31.1*  PLT 194 204 235 267 231  MCV 83.9 85.5 86.2 85.6 86.6  MCH 27.8 27.5 27.7 27.6 27.6  MCHC 33.1 32.1 32.1 32.2 31.8  RDW 18.1* 18.4* 18.5* 18.6* 19.4*  LYMPHSABS  --   --   --  1.1 1.5  MONOABS  --   --   --  0.7 1.0  EOSABS  --   --   --  0.4 0.5  BASOSABS  --   --   --  0.1 0.1    Recent Labs  Lab 02/26/21 1641 02/27/21 0701 02/27/21 1500 03/01/21 0458 03/02/21 0032 03/03/21 0043 03/03/21 0905 03/04/21 0038 03/04/21 1028 03/05/21 0030  NA  --  127*   < > 130* 129* 129*  --  131*  --  130*  K  --  3.6   < > 4.0 4.1 4.4  --  4.0  --  4.0  CL  --  92*   < > 92* 90* 93*  --  94*  --  95*  CO2  --  30   < > 30 31 29   --  30  --  27  GLUCOSE  --  139*   < > 117* 134* 107*  --  120*  --  81  BUN  --  24*   < > 19 19 22*  --  18  --  15  CREATININE  --  1.26*   < > 1.12 1.16 1.19  --  1.22  --  0.93  CALCIUM  --  8.1*   < > 8.2* 8.0* 8.0*  --  8.3*  --  8.3*  AST  --  33  --  31  --   --   --  27  --  26  ALT  --  87*   --  64*  --   --   --  40  --  36  ALKPHOS  --  102  --  100  --   --   --  92  --  99  BILITOT  --  2.5*  --  1.4*  --   --   --  1.0  --  1.1  ALBUMIN  --  2.2*  --  2.3*  --   --   --  2.3*  --  2.3*  MG  --   --   --   --   --   --   --  1.7  --  1.8  INR  --   --   --   --   --   --   --   --  1.3*  --   BNP 1,439.4*  --   --   --   --   --  1,086.7* 1,147.2*  --  946.5*   < > = values in this interval not displayed.     MICROBIOLOGY: Recent Results (from the past 240 hour(s))  SARS CORONAVIRUS 2 (TAT 6-24 HRS) Nasopharyngeal Nasopharyngeal Swab     Status: None   Collection Time: 02/25/21 12:53 PM   Specimen: Nasopharyngeal Swab  Result Value Ref Range Status   SARS Coronavirus 2 NEGATIVE NEGATIVE Final    Comment: (NOTE) SARS-CoV-2 target nucleic acids are NOT DETECTED.  The SARS-CoV-2 RNA is generally detectable in upper and lower respiratory specimens during the acute phase of infection. Negative results do not preclude SARS-CoV-2 infection, do not rule out co-infections with other pathogens, and should not be used as the sole basis for treatment or other patient management decisions. Negative results must be combined with clinical observations, patient history, and epidemiological information. The expected result is Negative.  Fact Sheet for Patients: HairSlick.no  Fact Sheet for Healthcare Providers: quierodirigir.com  This test is not yet approved or cleared by the [place] FDA and  has been authorized for detection and/or diagnosis of SARS-CoV-2 by FDA under an Emergency Use Authorization (EUA). This EUA will remain  in effect (meaning this test can be used) for the duration of the COVID-19 declaration under Se ction 564(b)(1) of the Act, 21 U.S.C. section 360bbb-3(b)(1), unless the authorization is terminated or revoked sooner.  Performed at [facility], [address], [place],  [place] [address]     RADIOLOGY STUDIES/RESULTS: DG Chest Port 1 View  Result Date: 03/04/2021 CLINICAL DATA:  Shortness of breath EXAM: PORTABLE CHEST 1 VIEW COMPARISON:  03/03/2021 FINDINGS: No new consolidation or edema. No pleural effusion. No pneumothorax. Stable cardiomegaly. IMPRESSION: No acute process in the chest.  Cardiomegaly. Electronically Signed   By: Guadlupe Spanish M.D.   On: 03/04/2021 08:23   DG Chest Port 1 View  Result Date: 03/03/2021 CLINICAL DATA:  Shortness of breath EXAM: PORTABLE CHEST 1 VIEW COMPARISON:  Chest radiograph dated 02/26/2021. FINDINGS: The heart is enlarged. The lungs are clear. Degenerative changes are seen in the spine. Chronic right-sided rib fractures and a chronic left clavicle deformity are redemonstrated. IMPRESSION: No active disease.  Cardiomegaly. Electronically Signed   By: Romona Curls M.D.   On: 03/03/2021 12:32     LOS: 19 days   Signature  Susa Raring M.D on 03/05/2021 at 9:15 AM   -  To page go to [email or web address]

## 2021-03-06 ENCOUNTER — Encounter (HOSPITAL_COMMUNITY): Payer: Self-pay | Admitting: Internal Medicine

## 2021-03-06 ENCOUNTER — Encounter (HOSPITAL_COMMUNITY)
Admission: AD | Disposition: A | Discharge status: Skilled Nursing Facility | Payer: Self-pay | Source: Other Acute Inpatient Hospital | Attending: Internal Medicine

## 2021-03-06 ENCOUNTER — Inpatient Hospital Stay (HOSPITAL_COMMUNITY): Payer: Medicare Other | Admitting: Registered Nurse

## 2021-03-06 DIAGNOSIS — T8781 Dehiscence of amputation stump: Secondary | ICD-10-CM

## 2021-03-06 DIAGNOSIS — L97524 Non-pressure chronic ulcer of other part of left foot with necrosis of bone: Secondary | ICD-10-CM | POA: Diagnosis not present

## 2021-03-06 DIAGNOSIS — I4821 Permanent atrial fibrillation: Secondary | ICD-10-CM | POA: Diagnosis not present

## 2021-03-06 DIAGNOSIS — I5041 Acute combined systolic (congestive) and diastolic (congestive) heart failure: Secondary | ICD-10-CM | POA: Diagnosis not present

## 2021-03-06 HISTORY — PX: AMPUTATION: SHX166

## 2021-03-06 LAB — CBC WITH DIFFERENTIAL/PLATELET
Abs Immature Granulocytes: 0.06 10*3/uL (ref 0.00–0.07)
Basophils Absolute: 0.2 10*3/uL — ABNORMAL HIGH (ref 0.0–0.1)
Basophils Relative: 2 %
Eosinophils Absolute: 0.4 10*3/uL (ref 0.0–0.5)
Eosinophils Relative: 5 %
HCT: 32 % — ABNORMAL LOW (ref 39.0–52.0)
Hemoglobin: 10.5 g/dL — ABNORMAL LOW (ref 13.0–17.0)
Immature Granulocytes: 1 %
Lymphocytes Relative: 18 %
Lymphs Abs: 1.5 10*3/uL (ref 0.7–4.0)
MCH: 28.2 pg (ref 26.0–34.0)
MCHC: 32.8 g/dL (ref 30.0–36.0)
MCV: 86 fL (ref 80.0–100.0)
Monocytes Absolute: 1 10*3/uL (ref 0.1–1.0)
Monocytes Relative: 12 %
Neutro Abs: 5.6 10*3/uL (ref 1.7–7.7)
Neutrophils Relative %: 62 %
Platelets: 270 10*3/uL (ref 150–400)
RBC: 3.72 MIL/uL — ABNORMAL LOW (ref 4.22–5.81)
RDW: 20.4 % — ABNORMAL HIGH (ref 11.5–15.5)
WBC: 8.8 10*3/uL (ref 4.0–10.5)
nRBC: 0 % (ref 0.0–0.2)

## 2021-03-06 LAB — COMPREHENSIVE METABOLIC PANEL
ALT: 33 U/L (ref 0–44)
AST: 30 U/L (ref 15–41)
Albumin: 2.5 g/dL — ABNORMAL LOW (ref 3.5–5.0)
Alkaline Phosphatase: 102 U/L (ref 38–126)
Anion gap: 10 (ref 5–15)
BUN: 16 mg/dL (ref 6–20)
CO2: 24 mmol/L (ref 22–32)
Calcium: 8.6 mg/dL — ABNORMAL LOW (ref 8.9–10.3)
Chloride: 96 mmol/L — ABNORMAL LOW (ref 98–111)
Creatinine, Ser: 1.02 mg/dL (ref 0.61–1.24)
GFR, Estimated: >60 mL/min (ref >60)
Glucose, Bld: 140 mg/dL — ABNORMAL HIGH (ref 70–99)
Potassium: 4.3 mmol/L (ref 3.5–5.1)
Sodium: 130 mmol/L — ABNORMAL LOW (ref 135–145)
Total Bilirubin: 0.9 mg/dL (ref 0.3–1.2)
Total Protein: 6.6 g/dL (ref 6.5–8.1)

## 2021-03-06 LAB — GLUCOSE, CAPILLARY
Glucose-Capillary: 110 mg/dL — ABNORMAL HIGH (ref 70–99)
Glucose-Capillary: 91 mg/dL (ref 70–99)
Glucose-Capillary: 105 mg/dL — ABNORMAL HIGH (ref 70–99)
Glucose-Capillary: 105 mg/dL — ABNORMAL HIGH (ref 70–99)
Glucose-Capillary: 162 mg/dL — ABNORMAL HIGH (ref 70–99)
Glucose-Capillary: 167 mg/dL — ABNORMAL HIGH (ref 70–99)

## 2021-03-06 LAB — MAGNESIUM: Magnesium: 2 mg/dL (ref 1.7–2.4)

## 2021-03-06 LAB — BRAIN NATRIURETIC PEPTIDE: B Natriuretic Peptide: 1629.4 pg/mL — ABNORMAL HIGH (ref 0.0–100.0)

## 2021-03-06 SURGERY — AMPUTATION BELOW KNEE
Anesthesia: Regional | Site: Knee | Laterality: Left

## 2021-03-06 MED ORDER — ONDANSETRON HCL 4 MG/2ML IJ SOLN: 4.0000 mg | Freq: Once | INTRAMUSCULAR | Status: DC | PRN

## 2021-03-06 MED ORDER — CHLORHEXIDINE GLUCONATE 0.12 % MT SOLN
15.0000 mL | OROMUCOSAL | Status: AC
  Filled 2021-03-06: qty 15

## 2021-03-06 MED ORDER — CHLORHEXIDINE GLUCONATE 0.12 % MT SOLN
OROMUCOSAL | Status: AC
  Administered 2021-03-06: 15 mL via OROMUCOSAL
  Filled 2021-03-06: qty 15

## 2021-03-06 MED ORDER — FENTANYL CITRATE (PF) 100 MCG/2ML IJ SOLN
25.0000 ug | INTRAMUSCULAR | Status: DC | PRN
  Administered 2021-03-06: 25 ug via INTRAVENOUS

## 2021-03-06 MED ORDER — VANCOMYCIN HCL 1000 MG/200ML IV SOLN
1000.0000 mg | Freq: Two times a day (BID) | INTRAVENOUS | Status: AC
  Administered 2021-03-06 – 2021-03-07 (×2): 1000 mg via INTRAVENOUS
  Filled 2021-03-06 (×2): qty 200

## 2021-03-06 MED ORDER — LIDOCAINE 2% (20 MG/ML) 5 ML SYRINGE
INTRAMUSCULAR | Status: DC | PRN
  Administered 2021-03-06: 40 mg via INTRAVENOUS

## 2021-03-06 MED ORDER — LIDOCAINE 2% (20 MG/ML) 5 ML SYRINGE
INTRAMUSCULAR | Status: AC
  Filled 2021-03-06: qty 5

## 2021-03-06 MED ORDER — POTASSIUM CHLORIDE 2 MEQ/ML IV SOLN
INTRAVENOUS | Status: AC
  Filled 2021-03-06: qty 1000

## 2021-03-06 MED ORDER — DEXAMETHASONE SODIUM PHOSPHATE 4 MG/ML IJ SOLN
INTRAMUSCULAR | Status: DC | PRN
  Administered 2021-03-06: 5 mg via INTRAVENOUS

## 2021-03-06 MED ORDER — PROPOFOL 10 MG/ML IV BOLUS
INTRAVENOUS | Status: DC | PRN
  Administered 2021-03-06: 130 mg via INTRAVENOUS

## 2021-03-06 MED ORDER — FENTANYL CITRATE (PF) 250 MCG/5ML IJ SOLN
INTRAMUSCULAR | Status: AC
  Filled 2021-03-06: qty 5

## 2021-03-06 MED ORDER — HEPARIN (PORCINE) 25000 UT/250ML-% IV SOLN
3150.0000 [IU]/h | INTRAVENOUS | Status: DC
  Administered 2021-03-07 (×2): 3150 [IU]/h via INTRAVENOUS
  Filled 2021-03-06 (×2): qty 250

## 2021-03-06 MED ORDER — TRANEXAMIC ACID-NACL 1000-0.7 MG/100ML-% IV SOLN
1000.0000 mg | Freq: Once | INTRAVENOUS | Status: AC
  Administered 2021-03-06: 1000 mg via INTRAVENOUS
  Filled 2021-03-06 (×2): qty 100

## 2021-03-06 MED ORDER — SODIUM CHLORIDE 0.9 % IV SOLN: INTRAVENOUS | Status: DC

## 2021-03-06 MED ORDER — HYDROMORPHONE HCL 1 MG/ML IJ SOLN
0.5000 mg | INTRAMUSCULAR | Status: DC | PRN
  Administered 2021-03-06 – 2021-03-09 (×3): 0.5 mg via INTRAVENOUS
  Filled 2021-03-06 (×4): qty 0.5

## 2021-03-06 MED ORDER — ONDANSETRON HCL 4 MG/2ML IJ SOLN
INTRAMUSCULAR | Status: DC | PRN
  Administered 2021-03-06: 4 mg via INTRAVENOUS

## 2021-03-06 MED ORDER — FENTANYL CITRATE (PF) 100 MCG/2ML IJ SOLN
INTRAMUSCULAR | Status: AC
  Administered 2021-03-06: 50 ug via INTRAVENOUS
  Filled 2021-03-06: qty 2

## 2021-03-06 MED ORDER — MIDAZOLAM HCL 2 MG/2ML IJ SOLN
INTRAMUSCULAR | Status: AC
  Administered 2021-03-06: 1 mg via INTRAVENOUS
  Filled 2021-03-06: qty 2

## 2021-03-06 MED ORDER — FENTANYL CITRATE (PF) 100 MCG/2ML IJ SOLN
INTRAMUSCULAR | Status: DC | PRN
  Administered 2021-03-06: 50 ug via INTRAVENOUS

## 2021-03-06 MED ORDER — PHENYLEPHRINE HCL (PRESSORS) 10 MG/ML IV SOLN
INTRAVENOUS | Status: DC | PRN
  Administered 2021-03-06 (×2): 80 ug via INTRAVENOUS

## 2021-03-06 MED ORDER — MIDAZOLAM HCL 2 MG/2ML IJ SOLN: 1.0000 mg | Freq: Once | INTRAMUSCULAR | Status: AC

## 2021-03-06 MED ORDER — MIDAZOLAM HCL 2 MG/2ML IJ SOLN
INTRAMUSCULAR | Status: AC
  Filled 2021-03-06: qty 2

## 2021-03-06 MED ORDER — HEPARIN (PORCINE) 25000 UT/250ML-% IV SOLN
3150.0000 [IU]/h | INTRAVENOUS | Status: DC
  Filled 2021-03-06: qty 250

## 2021-03-06 MED ORDER — CLINDAMYCIN PHOSPHATE 900 MG/50ML IV SOLN
900.0000 mg | INTRAVENOUS | Status: AC
  Administered 2021-03-06: 900 mg via INTRAVENOUS
  Filled 2021-03-06 (×2): qty 50

## 2021-03-06 MED ORDER — LACTATED RINGERS IV SOLN: INTRAVENOUS | Status: DC

## 2021-03-06 MED ORDER — FENTANYL CITRATE (PF) 100 MCG/2ML IJ SOLN
INTRAMUSCULAR | Status: AC
  Filled 2021-03-06: qty 2

## 2021-03-06 MED ORDER — FENTANYL CITRATE (PF) 100 MCG/2ML IJ SOLN: 50.0000 ug | Freq: Once | INTRAMUSCULAR | Status: AC

## 2021-03-06 MED ORDER — DEXMEDETOMIDINE (PRECEDEX) IN NS 20 MCG/5ML (4 MCG/ML) IV SYRINGE
PREFILLED_SYRINGE | INTRAVENOUS | Status: AC
  Filled 2021-03-06: qty 10

## 2021-03-06 MED ORDER — PROPOFOL 10 MG/ML IV BOLUS
INTRAVENOUS | Status: AC
  Filled 2021-03-06: qty 20

## 2021-03-06 SURGICAL SUPPLY — 37 items
BLADE SAW RECIP 87.9 MT (BLADE) ×2 IMPLANT
BLADE SURG 21 STRL SS (BLADE) ×2 IMPLANT
BNDG COHESIVE 6X5 TAN STRL LF (GAUZE/BANDAGES/DRESSINGS) IMPLANT
CANISTER WOUND CARE 500ML ATS (WOUND CARE) ×2 IMPLANT
COVER SURGICAL LIGHT HANDLE (MISCELLANEOUS) ×2 IMPLANT
COVER WAND RF STERILE (DRAPES) IMPLANT
CUFF TOURN SGL QUICK 34 (TOURNIQUET CUFF) ×2
CUFF TRNQT CYL 34X4.125X (TOURNIQUET CUFF) ×1 IMPLANT
DRAPE INCISE IOBAN 66X45 STRL (DRAPES) ×2 IMPLANT
DRAPE U-SHAPE 47X51 STRL (DRAPES) ×2 IMPLANT
DRESSING PREVENA PLUS CUSTOM (GAUZE/BANDAGES/DRESSINGS) ×1 IMPLANT
DRSG PREVENA PLUS CUSTOM (GAUZE/BANDAGES/DRESSINGS) ×2
DURAPREP 26ML APPLICATOR (WOUND CARE) ×2 IMPLANT
ELECT REM PT RETURN 9FT ADLT (ELECTROSURGICAL) ×2
ELECTRODE REM PT RTRN 9FT ADLT (ELECTROSURGICAL) ×1 IMPLANT
GLOVE BIOGEL PI IND STRL 9 (GLOVE) ×1 IMPLANT
GLOVE BIOGEL PI INDICATOR 9 (GLOVE) ×1
GLOVE SURG ORTHO 9.0 STRL STRW (GLOVE) ×2 IMPLANT
GOWN STRL REUS W/ TWL XL LVL3 (GOWN DISPOSABLE) ×2 IMPLANT
GOWN STRL REUS W/TWL XL LVL3 (GOWN DISPOSABLE) ×4
KIT BASIN OR (CUSTOM PROCEDURE TRAY) ×2 IMPLANT
KIT TURNOVER KIT B (KITS) ×2 IMPLANT
MANIFOLD NEPTUNE II (INSTRUMENTS) ×2 IMPLANT
NS IRRIG 1000ML POUR BTL (IV SOLUTION) ×2 IMPLANT
PACK ORTHO EXTREMITY (CUSTOM PROCEDURE TRAY) ×2 IMPLANT
PAD ARMBOARD 7.5X6 YLW CONV (MISCELLANEOUS) ×2 IMPLANT
PREVENA RESTOR ARTHOFORM 46X30 (CANNISTER) ×2 IMPLANT
SPONGE LAP 18X18 RF (DISPOSABLE) IMPLANT
STAPLER VISISTAT 35W (STAPLE) ×1 IMPLANT
STOCKINETTE IMPERVIOUS LG (DRAPES) ×2 IMPLANT
SUT ETHILON 2 0 PSLX (SUTURE) IMPLANT
SUT SILK 2 0 (SUTURE) ×2
SUT SILK 2-0 18XBRD TIE 12 (SUTURE) ×1 IMPLANT
SUT VIC AB 1 CTX 27 (SUTURE) ×4 IMPLANT
TOWEL GREEN STERILE (TOWEL DISPOSABLE) ×2 IMPLANT
TUBE CONNECTING 12X1/4 (SUCTIONS) ×2 IMPLANT
YANKAUER SUCT BULB TIP NO VENT (SUCTIONS) ×2 IMPLANT

## 2021-03-06 NOTE — Transfer of Care (Signed)
Immediate Anesthesia Transfer of Care Note  Patient: AJA BOLANDER  Procedure(s) Performed: LEFT BELOW KNEE AMPUTATION (Left Knee)  Patient Location: PACU  Anesthesia Type:General and Regional  Level of Consciousness: drowsy  Airway & Oxygen Therapy: Patient Spontanous Breathing and Patient connected to face mask oxygen  Post-op Assessment: Report given to RN and Post -op Vital signs reviewed and stable  Post vital signs: Reviewed and stable  Last Vitals:  Vitals Value Taken Time  BP 111/92 03/06/21 1300  Temp    Pulse 84 03/06/21 1303  Resp 11 03/06/21 1303  SpO2 97 % 03/06/21 1303  Vitals shown include unvalidated device data.  Last Pain:  Vitals:   03/06/21 1140  TempSrc:   PainSc: 0-No pain      Patients Stated Pain Goal: 5 (03/02/21 2129)  Complications: No complications documented.

## 2021-03-06 NOTE — Anesthesia Procedure Notes (Signed)
Anesthesia Regional Block: Adductor canal block   Pre-Anesthetic Checklist: ,, timeout performed, Correct Patient, Correct Site, Correct Laterality, Correct Procedure, Correct Position, site marked, Risks and benefits discussed, pre-op evaluation,  At surgeon's request and post-op pain management  Laterality: Left  Prep: Maximum Sterile Barrier Precautions used, chloraprep       Needles:  Injection technique: Single-shot  Needle Type: Echogenic Stimulator Needle     Needle Length: 9cm  Needle Gauge: 21     Additional Needles:   Procedures:,,,, ultrasound used (permanent image in chart),,,,  Narrative:  Start time: 03/06/2021 11:40 AM End time: 03/06/2021 11:45 AM Injection made incrementally with aspirations every 5 mL.  Performed by: Personally  Anesthesiologist: Kipp Brood, MD  Additional Notes: 20 cc 0.75% Ropivacaine

## 2021-03-06 NOTE — Anesthesia Postprocedure Evaluation (Signed)
Anesthesia Post Note  Patient: Fernando Maddox  Procedure(s) Performed: LEFT BELOW KNEE AMPUTATION (Left Knee)     Patient location during evaluation: PACU Anesthesia Type: Regional Level of consciousness: sedated, awake and lethargic Pain management: pain level controlled Vital Signs Assessment: post-procedure vital signs reviewed and stable Respiratory status: spontaneous breathing, nonlabored ventilation, respiratory function stable and patient connected to nasal cannula oxygen Cardiovascular status: blood pressure returned to baseline and stable Postop Assessment: no apparent nausea or vomiting Anesthetic complications: no   No complications documented.  Last Vitals:  Vitals:   03/06/21 1530 03/06/21 1550  BP: 107/80 118/87  Pulse:  97  Resp:  18  Temp:    SpO2:  97%    Last Pain:  Vitals:   03/06/21 1445  TempSrc:   PainSc: 5                  Illona Bulman COKER

## 2021-03-06 NOTE — Progress Notes (Signed)
ANTICOAGULATION CONSULT NOTE - Follow Up Consult  Pharmacy Consult for Heparin bridging Indication: atrial fibrillation  Allergies  Allergen Reactions  . Codeine Itching  . Bee Venom Other (See Comments)    "Feels like his head is on fire when he goes outside in the sun"  . Hydrocodone-Acetaminophen Other (See Comments)  . Other Other (See Comments)    "Feels like his head is on fire when he goes outside in the sun"  . Cephalexin     No mention of this allergy in notes from 07/2016 admission for osteomyelitis.  Patient was treated with Ancef and Unasyn for broader anaerobic coverage.  Patient is not aware of allergy.  . Vicodin [Hydrocodone-Acetaminophen] Itching  . Zocor [Simvastatin] Other (See Comments)    Myalgias    Patient Measurements: Height: 6\' 3"  (190.5 cm) Weight: 99.6 kg (219 lb 9.3 oz) IBW/kg (Calculated) : 84.5 Heparin Dosing Weight: 101 kg   Labs: Recent Labs    03/04/21 0038 03/04/21 1028 03/05/21 0030 03/06/21 0050  HGB 9.4*  --  9.9* 10.5*  HCT 29.2*  --  31.1* 32.0*  PLT 267  --  231 270  LABPROT  --  15.9*  --   --   INR  --  1.3*  --   --   CREATININE 1.22  --  0.93 1.02    Estimated Creatinine Clearance: 93.2 mL/min (by C-G formula based on SCr of 1.02 mg/dL).  Assessment: 59 y.o.male with medical history of DM, HTN, strokes, and A. Fib on rate control and systemic AC who presented 02/14/21 as a transfer from [facility] ED for evaluation of limb ischemic and septic shock. Per medical record had been on Xarelto, unclear when patient had his last dose. Patient started on Heparin infusion at [facility]. Pharmacy consulted to follow heparin drip.   Pt has been off of anticoagulation due to bleeding from TMA site. Pt underwent BKA today. Plan is to bridge with heparin starting at 1000 tomorrow. Pt was previously on a very high rate when he was on IV heparin.   Scr 1.02 Hgb 10.5 Plt wnl   Goal of Therapy:  Heparin level 0.3-0.7  units/ml Monitor platelets by anticoagulation protocol: Yes   Plan:  Start heparin 3150 units/hr at 1000 tomorrow Check 6 hr HL Daily HL and CBC F/u resume Xarelto  [name], PharmD, [place], AAHIVP, CPP Infectious Disease Pharmacist 03/06/2021 3:20 PM

## 2021-03-06 NOTE — Anesthesia Procedure Notes (Signed)
Procedure Name: LMA Insertion Date/Time: 03/06/2021 12:17 PM Performed by: Laruth Bouchard., CRNA Pre-anesthesia Checklist: Patient identified, Emergency Drugs available, Suction available, Patient being monitored and Timeout performed Patient Re-evaluated:Patient Re-evaluated prior to induction Oxygen Delivery Method: Circle system utilized Preoxygenation: Pre-oxygenation with 100% oxygen Induction Type: IV induction Ventilation: Mask ventilation without difficulty LMA: LMA inserted LMA Size: 4.0 Placement Confirmation: positive ETCO2 and breath sounds checked- equal and bilateral

## 2021-03-06 NOTE — Op Note (Signed)
   Date of Surgery: 03/06/2021  INDICATIONS: Fernando Maddox is a 60 y.o.-year-old male who is status post foot salvage intervention for transmetatarsal amputation on the left.  Patient has been full weightbearing postoperatively and has had progressive wound dehiscence of the incision with exposed bone and ulceration.  Due to wound dehiscence.  Bone infection involving the transmetatarsal amputation patient presents at this time for transtibial amputation.Marland Kitchen  PREOPERATIVE DIAGNOSIS: Abscess ulceration osteomyelitis wound dehiscence left transmetatarsal amputation.  POSTOPERATIVE DIAGNOSIS: Same.  PROCEDURE: Transtibial amputation Application of Prevena wound VAC  SURGEON: Lajoyce Corners, M.D.  ANESTHESIA:  general  IV FLUIDS AND URINE: See anesthesia records.  ESTIMATED BLOOD LOSS: See anesthesia records.  COMPLICATIONS: None.  DESCRIPTION OF PROCEDURE: The patient was brought to the operating room after undergoing regional anesthetic. After adequate levels of anesthesia were obtained patient's lower extremity was prepped using DuraPrep draped into a sterile field. A timeout was called. The foot was draped out of the sterile field with impervious stockinette. A transverse incision was made 11 cm distal to the tibial tubercle. This curved proximally and a large posterior flap was created. The tibia was transected 1 cm proximal to the skin incision. The fibula was transected just proximal to the tibial incision. The tibia was beveled anteriorly. A large posterior flap was created. The sciatic nerve was pulled cut and allowed to retract. The vascular bundles were suture ligated with 2-0 silk. The deep and superficial fascial layers were closed using #1 Vicryl. The skin was closed using staples and 2-0 nylon. The wound was covered with a Prevena customizable and arthroform wound VAC.  The dressing was sealed with dermatac there was a good suction fit. A prosthetic shrinker will be applied in patient's room.  Patient was taken to the PACU in stable condition.   DISCHARGE PLANNING:  Antibiotic duration: 24 hours  Weightbearing: Nonweightbearing on the operative extremity  Pain medication: Opioid pathway  Dressing care/ Wound VAC: Continue wound VAC for 1 week after discharge  Discharge to: Discharge planning based on therapy's recommendations for possible inpatient rehabilitation, outpatient rehabilitation, or discharge to home with therapy  Follow-up: In the office 1 week post operative.  Fernando Baker, MD [facility] 1:04 PM

## 2021-03-06 NOTE — Anesthesia Procedure Notes (Signed)
Anesthesia Regional Block: Popliteal block   Pre-Anesthetic Checklist: ,, timeout performed, Correct Patient, Correct Site, Correct Laterality, Correct Procedure, Correct Position, site marked, Risks and benefits discussed,  Surgical consent,  Pre-op evaluation,  At surgeon's request and post-op pain management  Laterality: Left  Prep: chloraprep       Needles:  Injection technique: Single-shot  Needle Type: Stimulator Needle - 40      Needle Gauge: 22     Additional Needles:   Procedures:, nerve stimulator,,,,,,,  Narrative:  Start time: 03/06/2021 11:35 AM End time: 03/06/2021 11:40 AM Injection made incrementally with aspirations every 5 mL.  Performed by: Personally  Anesthesiologist: Kipp Brood, MD  Additional Notes: 30 cc 0.5% Bupivacaine 1:200 Epi

## 2021-03-06 NOTE — Progress Notes (Signed)
Pt arrived back to the unit at approx 1415. Pt's parents in room and I gave them as much of a report/update as I could. PT c/o pain. Wound vac intact. Pt was given IV PRN dose of dilaudid. Pt resting comfortably now. Will cont to monitor.  Bed alarm on most sensitive setting d/t pt's hx of getting OOB after procedures.

## 2021-03-06 NOTE — Progress Notes (Signed)
Orthopedic Tech Progress Note Patient Details:  TERRELL OSTRAND Oct 23, 1961 [id number] Called in order to [facility] for a VIVE PROTOCOL BK Patient ID: AJAX SCHROLL, male   DOB: 04/10/1961, 60 y.o.   MRN: [id number]   Donald Pore 03/06/2021, 5:20 PM

## 2021-03-06 NOTE — Progress Notes (Signed)
PROGRESS NOTE        PATIENT DETAILS Name: Fernando Maddox Age: 60 y.o. Sex: male Date of Birth: 09/04/1961 Admit Date: 02/14/2021 Admitting Physician Charlott Holler, MD PCP:Van Domenic Schwab, MD  Brief Narrative: Patient is a 60 y.o. male with history of HFrEF, permanent atrial fibrillation, CAD, DM-2, known PAD-s/p partial amputation of right great toe presented to [facility] as a transfer from [facility] ED for septic shock in the setting of critical right lower extremity ischemia.  Significant events: 4/21>> transferred to [facility] from [facility]- found down-gangrene is right lower extremity-CT angio performed which showed concern for acute right arterial occlusion. 4/24>> transfer to [facility]. 4/27>> left TMA 5/3>> fall after getting out of bed-bleeding from TMA site-with wound dehiscence. 5/5>> bleeding from amputation stump 5/6>> recurrent bleeding from amputation stump-stopped with compressive dressings.  Significant studies: 4/21>> CT angio abdominal aorta with iliofemoral runoff: No opacification beyond mid SFA-suspicious for arterial embolism.  L1, L2, L3 and L4 vertebral bodies compression fractures.  Mildly enlarged left retroperitoneal, pelvic and inguinal lymph nodes. 4/21>> CT chest: No acute pulmonary disease. 4/21>> CT head: No acute intracranial abnormality 4/21>> CT C-spine: No acute fractures. 4/22>> chest x-ray: No active disease 4/22>> right lower extremity arterial duplex: No evidence of stenosis or occlusive disease. 4/22>> Echo: EF 20-25%, RV systolic function severely reduced  Antimicrobial therapy: Vancomycin: 4/21>>4/24 Zosyn: 4/21>> 4/24 Unasyn: 4/25>>4/28  Microbiology data: None  Procedures : 4/27>> left transmetatarsal amputation  Consults: Vascular surgery, PCCM, orthopedics, cardiology  DVT Prophylaxis : IV heparin  Subjective:  Patient in bed, appears comfortable, denies any headache, no fever, no chest pain or pressure, no shortness of  breath , no abdominal pain. No new focal weakness.   Assessment/Plan:  Septic shock due to critical left limb ischemia with left forefoot ulceration/gangrene s/p transmetatarsal amputation on 4/27: Sepsis physiology has resolved - all antimicrobial therapy discontinued on 02/21/21.   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  He continued to have subsequent episodes of bleeding.  Xarelto held.  Orthopedics planning BKA on 5/11.  Acute metabolic encephalopathy: Due to above-found unresponsive at home-improved.  Anemia: Due to combination of anemia of chronic disease, iron deficiency anemia and acute blood loss anemia from TMA stump bleeding.  Follow CBC-needs outpatient endoscopic evaluation.  Acute on chronic systolic/diastolic heart failure/new RV failure: Volume status better-cardiology following.  Currently compensated on low-dose beta-blocker.  As needed diuretics as needed.  Chronic atrial fibrillation with RVR: Rate controlled-on metoprolol-Xarelto on hold due to TMA stump of bleeding.  If H&H remains stable for the next few days post BKA may start heparin drip with caution.  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  Transaminitis: LFTs have essentially normalized-no longer on amiodarone.  Acute hepatitis serology negative.  History of CAD: No anginal symptoms-not on aspirin as patient on anticoagulation  Prior history of polysubstance use: UDS 3 years back positive for amphetamine/cannabinoids.  ?  OSA/central sleep apnea: Noncompliant with it despite counseling, reconsult on 03/03/2021 outpatient sleep study and pulmonary follow-up to be arranged by PCP.  Goals of care: Noncompliant to medications-noncompliance to weightbearing status-very poor insight into his medical issues-poor social economic  conditions-probably has end-stage CHF-unfortunately his  overall long-term prognosis is very poor.  I do not think he is a candidate for aggressive therapies.  Evaluated by palliative care-DNR in place.  Will need ongoing goals of care discussion.  DM - 2 (A1c 7.7 on 4/25): CBGs at goal. Continue SSI.Resume metformin on discharge.  CBG (last 3)  Recent Labs    03/05/21 1710 03/05/21 2049 03/06/21 0719  GLUCAP 138* 149* 110*    Nutrition Problem: Nutrition Problem: Moderate Malnutrition Etiology: chronic illness (CHF) Signs/Symptoms: mild muscle depletion,moderate muscle depletion,mild fat depletion,moderate fat depletion Interventions: MVI,Glucerna shake,Magic cup   Diet: Diet Order            Diet NPO time specified  Diet effective now                  Code Status: Full code   Family Communication:  Called Father - [name]-[phone]-updated over the phone on 5/5-called on 5/7-voicemail not set up-unable to leave a voicemail, message left on 03/05/21 @ 9.16 am.  Called mother- [name]-[phone] -updated over the phone on 4/29.  Disposition Plan: Status is: Inpatient  Remains inpatient appropriate because:Inpatient level of care appropriate due to severity of illness   Dispo: The patient is from: Home              Anticipated d/c is to: tbd              Patient currently is not medically stable to d/c.   Difficult to place patient No   Barriers to Discharge: ongoing diuresis-left BKA planned for Wednesday.   MEDICATIONS: Scheduled Meds: . vitamin C  1,000 mg Oral Daily  . Chlorhexidine Gluconate Cloth  6 each Topical Daily  . docusate sodium  100 mg Oral BID  . famotidine  20 mg Oral Daily  . feeding supplement (GLUCERNA SHAKE)  237 mL Oral BID BM  . insulin aspart  0-15 Units Subcutaneous TID WC  . insulin aspart  0-5 Units Subcutaneous QHS  . mouth rinse  15 mL Mouth Rinse BID  . metoprolol succinate  12.5 mg Oral Daily  . midodrine  10  mg Oral TID WC  . multivitamin with minerals  1 tablet Oral Daily  . pantoprazole  40 mg Oral Daily   Continuous Infusions: . sodium chloride 10 mL/hr at 02/21/21 0810  . clindamycin (CLEOCIN) IV     PRN Meds:.acetaminophen **OR** [DISCONTINUED] acetaminophen, albuterol, ALPRAZolam, alum & mag hydroxide-simeth, bisacodyl, guaiFENesin-dextromethorphan, magnesium citrate, [DISCONTINUED] ondansetron **OR** ondansetron (ZOFRAN) IV, oxyCODONE-acetaminophen, phenol, polyethylene glycol, zolpidem   PHYSICAL EXAM:  Vitals:   03/05/21 1942 03/05/21 2330 03/06/21 0344 03/06/21 0725  BP: 91/60 121/83 95/84 98/85   Pulse: 95 98 99 90  Resp: 18 11 20 18   Temp: 98 F (36.7 C) 98.2 F (36.8 C) 98 F (36.7 C) 98.1 F (36.7 C)  TempSrc: Oral Oral Oral Oral  SpO2: 96% 96% 100% 100%  Weight:   99.6 kg   Height:       Filed Weights   03/03/21 0400 03/04/21 0335 03/06/21 0344  Weight: 95.3 kg 98.3 kg 99.6 kg   Body mass index is 27.45 kg/m.   Gen Exam:  Awake Alert, No new F.N deficits, Normal affect Quinn.AT,PERRAL Supple Neck,No JVD, No cervical lymphadenopathy appriciated.  Symmetrical Chest wall movement, Good air movement bilaterally, CTAB RRR,No Gallops, Rubs or new Murmurs, No Parasternal Heave +ve B.Sounds, Abd Soft, No tenderness, No organomegaly appriciated, No rebound - guarding or rigidity. No Cyanosis, L TMA stump under bandage  I have personally reviewed following labs and imaging studies  LABORATORY DATA:  Recent Labs  Lab 03/02/21 0032 03/03/21 0043 03/04/21 0038 03/05/21 0030 03/06/21 0050  WBC 5.7 5.3 5.9 7.5 8.8  HGB 9.5* 9.6* 9.4* 9.9* 10.5*  HCT 29.6* 29.9* 29.2* 31.1* 32.0*  PLT 204 235 267 231 270  MCV 85.5 86.2 85.6 86.6 86.0  MCH 27.5 27.7 27.6 27.6 28.2  MCHC 32.1 32.1 32.2 31.8 32.8  RDW 18.4* 18.5* 18.6* 19.4* 20.4*  LYMPHSABS  --   --  1.1 1.5 1.5  MONOABS  --   --  0.7 1.0 1.0  EOSABS  --   --  0.4 0.5 0.4  BASOSABS  --   --  0.1 0.1 0.2*     Recent Labs  Lab 03/01/21 0458 03/02/21 0032 03/03/21 0043 03/03/21 0905 03/04/21 0038 03/04/21 1028 03/05/21 0030 03/06/21 0050  NA 130* 129* 129*  --  131*  --  130* 130*  K 4.0 4.1 4.4  --  4.0  --  4.0 4.3  CL 92* 90* 93*  --  94*  --  95* 96*  CO2 30 31 29   --  30  --  27 24  GLUCOSE 117* 134* 107*  --  120*  --  81 140*  BUN 19 19 22*  --  18  --  15 16  CREATININE 1.12 1.16 1.19  --  1.22  --  0.93 1.02  CALCIUM 8.2* 8.0* 8.0*  --  8.3*  --  8.3* 8.6*  AST 31  --   --   --  27  --  26 30  ALT 64*  --   --   --  40  --  36 33  ALKPHOS 100  --   --   --  92  --  99 102  BILITOT 1.4*  --   --   --  1.0  --  1.1 0.9  ALBUMIN 2.3*  --   --   --  2.3*  --  2.3* 2.5*  MG  --   --   --   --  1.7  --  1.8 2.0  INR  --   --   --   --   --  1.3*  --   --   BNP  --   --   --  1,086.7* 1,147.2*  --  946.5* 1,629.4*     MICROBIOLOGY: Recent Results (from the past 240 hour(s))  SARS CORONAVIRUS 2 (TAT 6-24 HRS) Nasopharyngeal Nasopharyngeal Swab     Status: None   Collection Time: 02/25/21 12:53 PM   Specimen: Nasopharyngeal Swab  Result Value Ref Range Status   SARS Coronavirus 2 NEGATIVE NEGATIVE Final    Comment: (NOTE) SARS-CoV-2 target nucleic acids are NOT DETECTED.  The SARS-CoV-2 RNA is generally detectable in upper and lower respiratory specimens during the acute phase of infection. Negative results do not preclude SARS-CoV-2 infection, do not rule out co-infections with other pathogens, and should not be used as the sole basis for treatment or other patient management decisions. Negative results must be combined with clinical observations, patient history, and epidemiological information. The expected result is Negative.  Fact Sheet for Patients: 04/27/21  Fact Sheet for Healthcare Providers: HairSlick.no  This test is not yet approved or cleared by the quierodirigir.com FDA and  has been  authorized for detection and/or diagnosis of SARS-CoV-2 by FDA under an Emergency Use Authorization (EUA). This EUA will remain  in effect (meaning this test can be used) for the duration of the COVID-19 declaration under Se ction 564(b)(1) of the Act, 21 U.S.C. section 360bbb-3(b)(1), unless the authorization is terminated or revoked sooner.  Performed at [facility], [address], [place], [place] [address]     RADIOLOGY STUDIES/RESULTS: No results found.   LOS: 20 days   Signature  Susa Raring M.D on 03/06/2021 at 8:52 AM   -  To page go to [email or web address]

## 2021-03-06 NOTE — Interval H&P Note (Signed)
History and Physical Interval Note:  03/06/2021 7:25 AM  Fernando Maddox  has presented today for surgery, with the diagnosis of Dehiscence Left Transmetatarsal Amputation.  The various methods of treatment have been discussed with the patient and family. After consideration of risks, benefits and other options for treatment, the patient has consented to  Procedure(s): LEFT BELOW KNEE AMPUTATION (Left) as a surgical intervention.  The patient's history has been reviewed, patient examined, no change in status, stable for surgery.  I have reviewed the patient's chart and labs.  Questions were answered to the patient's satisfaction.     [name]

## 2021-03-06 NOTE — Anesthesia Preprocedure Evaluation (Signed)
Anesthesia Evaluation  Patient identified by MRN, date of birth, ID band Patient awake    Reviewed: Allergy & Precautions, NPO status , Patient's Chart, lab work & pertinent test results  Airway Mallampati: II  TM Distance: >3 FB     Dental  (+) Teeth Intact, Dental Advisory Given   Pulmonary former smoker,    breath sounds clear to auscultation       Cardiovascular hypertension,  Rhythm:Regular Rate:Normal     Neuro/Psych    GI/Hepatic   Endo/Other  diabetes  Renal/GU      Musculoskeletal   Abdominal   Peds  Hematology   Anesthesia Other Findings   Reproductive/Obstetrics                             Anesthesia Physical Anesthesia Plan  ASA: III  Anesthesia Plan: General   Post-op Pain Management:    Induction: Intravenous  PONV Risk Score and Plan: Ondansetron and Dexamethasone  Airway Management Planned: LMA  Additional Equipment:   Intra-op Plan:   Post-operative Plan:   Informed Consent: I have reviewed the patients History and Physical, chart, labs and discussed the procedure including the risks, benefits and alternatives for the proposed anesthesia with the patient or authorized representative who has indicated his/her understanding and acceptance.     Dental advisory given  Plan Discussed with: CRNA and Anesthesiologist  Anesthesia Plan Comments: (Plan LMA with Popliteal/Saphenous blocks)        Anesthesia Quick Evaluation

## 2021-03-06 NOTE — Progress Notes (Signed)
OT Cancellation Note  Patient Details Name: Fernando Maddox MRN: [id number] DOB: 1961/09/11   Cancelled Treatment:    Reason Eval/Treat Not Completed: Patient at procedure or test/ unavailable;Other (comment) Pt with planned surgical transition to BKA today. Will follow-up for OT session s/p sx .  Lorre Munroe 03/06/2021, 9:35 AM

## 2021-03-07 ENCOUNTER — Encounter (HOSPITAL_COMMUNITY): Payer: Self-pay | Admitting: Orthopedic Surgery

## 2021-03-07 ENCOUNTER — Inpatient Hospital Stay (HOSPITAL_COMMUNITY): Payer: Medicare Other

## 2021-03-07 DIAGNOSIS — L97524 Non-pressure chronic ulcer of other part of left foot with necrosis of bone: Secondary | ICD-10-CM | POA: Diagnosis not present

## 2021-03-07 LAB — CBC WITH DIFFERENTIAL/PLATELET
Abs Immature Granulocytes: 0.06 10*3/uL (ref 0.00–0.07)
Basophils Absolute: 0 10*3/uL (ref 0.0–0.1)
Basophils Relative: 0 %
Eosinophils Absolute: 0 10*3/uL (ref 0.0–0.5)
Eosinophils Relative: 0 %
HCT: 34.4 % — ABNORMAL LOW (ref 39.0–52.0)
Hemoglobin: 10.8 g/dL — ABNORMAL LOW (ref 13.0–17.0)
Immature Granulocytes: 1 %
Lymphocytes Relative: 7 %
Lymphs Abs: 0.7 10*3/uL (ref 0.7–4.0)
MCH: 27.7 pg (ref 26.0–34.0)
MCHC: 31.4 g/dL (ref 30.0–36.0)
MCV: 88.2 fL (ref 80.0–100.0)
Monocytes Absolute: 0.7 10*3/uL (ref 0.1–1.0)
Monocytes Relative: 7 %
Neutro Abs: 7.7 10*3/uL (ref 1.7–7.7)
Neutrophils Relative %: 85 %
Platelets: 376 10*3/uL (ref 150–400)
RBC: 3.9 MIL/uL — ABNORMAL LOW (ref 4.22–5.81)
RDW: 20.7 % — ABNORMAL HIGH (ref 11.5–15.5)
WBC: 9.2 10*3/uL (ref 4.0–10.5)
nRBC: 0 % (ref 0.0–0.2)

## 2021-03-07 LAB — BRAIN NATRIURETIC PEPTIDE: B Natriuretic Peptide: 1730.2 pg/mL — ABNORMAL HIGH (ref 0.0–100.0)

## 2021-03-07 LAB — COMPREHENSIVE METABOLIC PANEL
ALT: 35 U/L (ref 0–44)
AST: 37 U/L (ref 15–41)
Albumin: 2.6 g/dL — ABNORMAL LOW (ref 3.5–5.0)
Alkaline Phosphatase: 94 U/L (ref 38–126)
Anion gap: 17 — ABNORMAL HIGH (ref 5–15)
BUN: 22 mg/dL — ABNORMAL HIGH (ref 6–20)
CO2: 17 mmol/L — ABNORMAL LOW (ref 22–32)
Calcium: 8.8 mg/dL — ABNORMAL LOW (ref 8.9–10.3)
Chloride: 96 mmol/L — ABNORMAL LOW (ref 98–111)
Creatinine, Ser: 1.28 mg/dL — ABNORMAL HIGH (ref 0.61–1.24)
GFR, Estimated: >60 mL/min (ref >60)
Glucose, Bld: 174 mg/dL — ABNORMAL HIGH (ref 70–99)
Potassium: 5.7 mmol/L — ABNORMAL HIGH (ref 3.5–5.1)
Sodium: 130 mmol/L — ABNORMAL LOW (ref 135–145)
Total Bilirubin: 2.1 mg/dL — ABNORMAL HIGH (ref 0.3–1.2)
Total Protein: 6.8 g/dL (ref 6.5–8.1)

## 2021-03-07 LAB — GLUCOSE, CAPILLARY
Glucose-Capillary: 178 mg/dL — ABNORMAL HIGH (ref 70–99)
Glucose-Capillary: 204 mg/dL — ABNORMAL HIGH (ref 70–99)
Glucose-Capillary: 240 mg/dL — ABNORMAL HIGH (ref 70–99)
Glucose-Capillary: 164 mg/dL — ABNORMAL HIGH (ref 70–99)

## 2021-03-07 LAB — MAGNESIUM: Magnesium: 2.1 mg/dL (ref 1.7–2.4)

## 2021-03-07 LAB — PROCALCITONIN: Procalcitonin: <0.1 ng/mL

## 2021-03-07 LAB — HEPARIN LEVEL (UNFRACTIONATED): Heparin Unfractionated: 1.08 IU/mL — ABNORMAL HIGH (ref 0.30–0.70)

## 2021-03-07 MED ORDER — SODIUM ZIRCONIUM CYCLOSILICATE 10 G PO PACK: 10.0000 g | PACK | Freq: Two times a day (BID) | ORAL | Status: DC

## 2021-03-07 MED ORDER — FUROSEMIDE 40 MG PO TABS: 40.0000 mg | ORAL_TABLET | Freq: Once | ORAL | Status: DC

## 2021-03-07 MED ORDER — LACTATED RINGERS IV SOLN: INTRAVENOUS | Status: AC

## 2021-03-07 MED ORDER — HEPARIN (PORCINE) 25000 UT/250ML-% IV SOLN
2600.0000 [IU]/h | INTRAVENOUS | Status: DC
  Administered 2021-03-07 – 2021-03-08 (×2): 2850 [IU]/h via INTRAVENOUS
  Filled 2021-03-07 (×3): qty 250

## 2021-03-07 MED ORDER — SODIUM ZIRCONIUM CYCLOSILICATE 10 G PO PACK
10.0000 g | PACK | Freq: Two times a day (BID) | ORAL | Status: AC
  Administered 2021-03-07 (×2): 10 g via ORAL
  Filled 2021-03-07 (×2): qty 1

## 2021-03-07 MED ORDER — SODIUM CHLORIDE 0.9 % IV SOLN
3.0000 g | Freq: Four times a day (QID) | INTRAVENOUS | Status: DC
  Administered 2021-03-07 – 2021-03-09 (×8): 3 g via INTRAVENOUS
  Filled 2021-03-07 (×5): qty 3
  Filled 2021-03-07 (×2): qty 8
  Filled 2021-03-07: qty 3
  Filled 2021-03-07 (×2): qty 8

## 2021-03-07 MED ORDER — SODIUM POLYSTYRENE SULFONATE 15 GM/60ML PO SUSP
30.0000 g | Freq: Once | ORAL | Status: AC
  Administered 2021-03-07: 30 g via ORAL
  Filled 2021-03-07: qty 120

## 2021-03-07 MED ORDER — LACTATED RINGERS IV SOLN: INTRAVENOUS | Status: DC

## 2021-03-07 NOTE — Progress Notes (Signed)
ANTICOAGULATION CONSULT NOTE - Follow Up Consult  Pharmacy Consult for Heparin bridging (Xarelto on hold) Indication: atrial fibrillation  Allergies  Allergen Reactions  . Codeine Itching  . Bee Venom Other (See Comments)    "Feels like his head is on fire when he goes outside in the sun"  . Hydrocodone-Acetaminophen Other (See Comments)  . Other Other (See Comments)    "Feels like his head is on fire when he goes outside in the sun"  . Cephalexin     No mention of this allergy in notes from 07/2016 admission for osteomyelitis.  Patient was treated with Ancef and Unasyn for broader anaerobic coverage.  Patient is not aware of allergy.  . Vicodin [Hydrocodone-Acetaminophen] Itching  . Zocor [Simvastatin] Other (See Comments)    Myalgias    Patient Measurements: Height: 6\' 3"  (190.5 cm) Weight: 99.6 kg (219 lb 9.3 oz) IBW/kg (Calculated) : 84.5 Heparin Dosing Weight: 101 kg  Vital Signs: Temp: 97.8 F (36.6 C) (05/12 1627) Temp Source: Oral (05/12 1627) BP: 115/87 (05/12 1627) Pulse Rate: 91 (05/12 1627)  Labs: Recent Labs    03/05/21 0030 03/06/21 0050 03/07/21 0047 03/07/21 1859  HGB 9.9* 10.5* 10.8*  --   HCT 31.1* 32.0* 34.4*  --   PLT 231 270 376  --   HEPARINUNFRC  --   --   --  1.08*  CREATININE 0.93 1.02 1.28*  --     Estimated Creatinine Clearance: 74.3 mL/min (A) (by C-G formula based on SCr of 1.28 mg/dL (H)).  Assessment: 59 y.o.male with medical history of DM, HTN, strokes, and A. Fib on rate control and systemic AC who presented 02/14/21 as a transfer from [facility] ED for evaluation of limb ischemic and septic shock.Per medical record had been on Xarelto, unclear when patient had his last dose. Patient started on Heparin infusion at [facility]. Pharmacy consulted to follow heparin drip.   Pt had been off of anticoagulation since 02/28/21 due to bleeding from TMA site. 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 Heparin resumed at 1000 today. Previously on a very  high rate, and was resumed at prior rate of 3150 units/hr.   Heparin level tonight is supratherapeutic (1.08) on 3150 units/hr. No infusion problems or bleeding reported.  Sample appears drawn appropriately. CBC stable.  Goal of Therapy:  Heparin level 0.3-0.7 units/ml Monitor platelets by anticoagulation protocol: Yes   Plan:  Hold heparin for 1 hour. Resume heparin at ~10pm at 2850 units/hr Heparin level ~8 hrs after drip resumed. Daily heparin level and CBC while on heparin. Monitor for s/sx bleeding. Xarelto on hold.   05/06/21, Dennie Fetters 03/07/2021,9:03 PM

## 2021-03-07 NOTE — Evaluation (Signed)
Clinical/Bedside Swallow Evaluation Patient Details  Name: Fernando Maddox MRN: [id number] Date of Birth: 06-Aug-1961  Today's Date: 03/07/2021 Time: SLP Start Time (ACUTE ONLY): 1654 SLP Stop Time (ACUTE ONLY): 1714 SLP Time Calculation (min) (ACUTE ONLY): 20.87 min  Past Medical History:  Past Medical History:  Diagnosis Date  . Acute combined systolic and diastolic CHF, NYHA class 1 (HCC) 08/07/2016  . Anemia   . Atrial fibrillation (HCC)   . BACK PAIN 02/11/2010   Qualifier: Diagnosis of  By: Manson Passey, RN, BSN, Lauren    . CAD (coronary artery disease) 02/11/2010   Qualifier: Diagnosis of  By: Manson Passey, RN, BSN, Lauren    . Cellulitis 09/29/2017  . Cerebrovascular disease   . CHEST PAIN 02/11/2010   Qualifier: Diagnosis of  By: Manson Passey, RN, BSN, Lauren    . Chronic anticoagulation 03/25/2018  . Chronic pain syndrome   . Chronic systolic congestive heart failure (HCC) 03/25/2018  . Community acquired pneumonia 08/11/2016  . DEPRESSION/ANXIETY 02/11/2010   Qualifier: Diagnosis of  By: Manson Passey, RN, BSN, Lauren    . Diabetes mellitus, type 2 (HCC) 02/11/2010   Qualifier: Diagnosis of  By: Manson Passey, RN, BSN, Lauren    . Diabetic ulcer of toe of right foot associated with type 2 diabetes mellitus (HCC) 10/01/2017  . ESOPHAGEAL STRICTURE 02/11/2010   Qualifier: Diagnosis of  By: Manson Passey, RN, BSN, Lauren    . GERD (gastroesophageal reflux disease)   . HEADACHE 02/11/2010   Qualifier: Diagnosis of  By: Manson Passey, RN, BSN, Lauren    . History of CVA (cerebrovascular accident) 09/29/2017  . History of drug abuse (HCC) 08/05/2016  . Hypercholesteremia   . Hyperlipemia 02/11/2010   Qualifier: Diagnosis of  By: Manson Passey, RN, BSN, Lauren    . Hyperlipidemia   . Hypertension 02/11/2010   Qualifier: Diagnosis of  By: Manson Passey, RN, BSN, Lauren    . HYPERTENSION, UNSPECIFIED 02/11/2010   Qualifier: Diagnosis of  By: Manson Passey, RN, BSN, Lauren    . Hypogonadism male   . 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 Status post thrombectomy  . Osteoarthritis 08/05/2016  . Osteomyelitis, unspecified (HCC) 10/06/2017  . PAF (paroxysmal atrial fibrillation) (HCC) 08/05/2016  . Polysubstance abuse (HCC) 09/29/2017  . Rotator cuff syndrome    Shoulder  . Septic shock (HCC) 02/14/2021  . Shoulder pain   . 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 Sleep apnea   . Stroke (HCC) 08/05/2016  . Tobacco use 08/11/2016  . Ulcer of left foot with necrosis of bone [facility])    Past Surgical History:  Past Surgical History:  Procedure Laterality Date  . AMPUTATION Left 02/20/2021   Procedure: LEFT TRANSMETATARSAL AMPUTATION;  Surgeon: [name], MD;  Location: [facility] OR;  Service: Orthopedics;  Laterality: Left;  . AMPUTATION Left 03/06/2021   Procedure: LEFT BELOW KNEE AMPUTATION;  Surgeon: [name], MD;  Location: [facility] OR;  Service: Orthopedics;  Laterality: Left;  . NO PAST SURGERIES     HPI:  Pt is a 60 y.o. male who presented as a transfer from [facility] ED for evaluation of limb ischemic and septic shock after being found found down and minimally responsive. Dx metabolic encephalopathy. Palliative care 5/4: DNR established. Continue other interventions to optimize. 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  CXRs 4/21-5/9 were negative for acute disease. CXR 5/12: Ill-defined airspace opacity in the right lower lobe region concerning for developing  pneumonia. Aspiration PNA post surgery 03/07/21 suspected by MD. PMH: DM, HTN, CVAs, and A. Fib on rate control and systemic AC.   Assessment / Plan / Recommendation Clinical Impression  Pt was seen for bedside swallow evaluation and he denied a history of dysphagia. Pt's RN reported that the pt has demonstrated oral holding and has been resistant to sitting upright during p.o. intake. Oral mechanism exam was significant for left-sided facial weakness. He presented with adequate, natural dentition.  Pt tolerated all solids and liquids without signs or symptoms of aspiration. Mild oral residue was cleared with a liquid wash. Pt demonstrated mildly increased WOB and reported respiratory difficulty at the end of the evaluation which, per RN, the pt does periodically. Pt's current diet of soft solids and thin liquids will be continued at this time. A modified barium swallow study is recommended to further investigate swallow function. SLP Visit Diagnosis: Dysphagia, unspecified (R13.10)    Aspiration Risk  Mild aspiration risk    Diet Recommendation Dysphagia 3 (Mech soft);Thin liquid   Liquid Administration via: Cup;Straw Medication Administration: Whole meds with liquid Supervision: Patient able to self feed Compensations: Slow rate Postural Changes: Seated upright at 90 degrees    Other  Recommendations Oral Care Recommendations: Oral care BID   Follow up Recommendations  (TBD)      Frequency and Duration min 2x/week  1 week       Prognosis Prognosis for Safe Diet Advancement: Good      Swallow Study   General Date of Onset: 03/07/21 HPI: Pt is a 60 y.o. male who presented as a transfer from [facility] hospital ED for evaluation of limb ischemic and septic shock after being found found down and minimally responsive. Dx metabolic encephalopathy. Palliative care 5/4: DNR established. Continue other interventions to optimize. 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  CXRs 4/21-5/9 were negative for acute disease. CXR 5/12: Ill-defined airspace opacity in the right lower lobe region concerning for developing pneumonia. Aspiration PNA post surgery 03/07/21 suspected by MD. PMH: DM, HTN, CVAs, and A. Fib on rate control and systemic AC. Type of Study: Bedside Swallow Evaluation Previous Swallow Assessment: none Diet Prior to this Study: Dysphagia 3 (soft);Thin liquids Temperature Spikes Noted: No Respiratory Status: Room air History of Recent Intubation: No Behavior/Cognition:  Alert;Confused;Cooperative Oral Cavity Assessment: Within Functional Limits Oral Care Completed by SLP: No Oral Cavity - Dentition: Adequate natural dentition Vision: Functional for self-feeding Self-Feeding Abilities: Able to feed self Patient Positioning: Upright in bed;Postural control adequate for testing Baseline Vocal Quality: Low vocal intensity Volitional Cough: Strong Volitional Swallow: Able to elicit    Oral/Motor/Sensory Function Overall Oral Motor/Sensory Function: Mild impairment Facial ROM: Reduced left Facial Symmetry: Abnormal symmetry left Facial Strength: Reduced left Facial Sensation: Within Functional Limits   Ice Chips Ice chips: Within functional limits Presentation: Spoon   Thin Liquid Thin Liquid: Within functional limits Presentation: Straw    Nectar Thick Nectar Thick Liquid: Not tested   Honey Thick Honey Thick Liquid: Not tested   Puree Puree: Within functional limits Presentation: Spoon   Solid     Solid: Impaired Presentation: Self Fed Oral Phase Functional Implications: Oral residue     Finlee Concepcion I. Vear Clock, MS, CCC-SLP Acute Rehabilitation Services Office number [phone] Pager [phone]   Scheryl Marten 03/07/2021,5:51 PM

## 2021-03-07 NOTE — Consult Note (Signed)
   [facility] CM Inpatient Consult   03/07/2021  Elick Agu[facility] 08-06-1961 [id number]   Follow up:  Update on progress [day 20] [facility] Medicare  Reviewed for progress and disposition needs.  Reviewed PT/OT notes post left BKA on 03/06/21.  Patient currently is recommended for a skilled nursing facility level of care.  Plan:  Continue to follow progress and if patient transitions to a [facility] affiliated Skilled nursing facility this writer will alert the [facility] RN of barriers and ongoing care needs to return to community.  For questions, please contact:  Charlesetta Shanks, RN BSN CCM [facility]  [phone] business mobile phone Toll free office [phone]  Fax number: [phone] [email or web address]

## 2021-03-07 NOTE — Progress Notes (Signed)
Patient is postop day 1 status post left below-knee amputation.  He is lying in bed comfortable.  Hemoglobin and hematocrit are stable vital signs are stable wound VAC is functioning with 1 green  0 cc in the canister  We will begin to mobilize with PT orthopedic standpoint for discharge next 1 to 2 days

## 2021-03-07 NOTE — Progress Notes (Signed)
Occupational Therapy Treatment Patient Details Name: Fernando Maddox MRN: [id number] DOB: 11/03/1960 Today's Date: 03/07/2021    History of present illness Fernando Maddox is a 60 y.o. male who presents with necrotic ulceration and purulent drainage from the left forefoot involving the great toe second toe and third toe.  Patient is unsure when this started. On 4/27, pt underwent L transmetatarsal amputation. Patient is status post a partial amputation of the right great toe done 02/20/21. Unfortunately he has had ongoing issues with excessive bleeding and dehiscence of the amputation site, and ultiimately received a L BKA on 03/06/21. PMH: a fib, DM, GERD, CVA, drug abuse, HTN.   OT comments  Pt seen for first OT session since L BKA on 5/11. Pt presents with minimal pain, pleasantly confused and talkative throughout session with therapists. Pt requiring increased physical assistance for all mobility attempts since BKA with Total A x 2 for bed mobility. Once sitting EOB, pt able to maintain sitting balance well for UB ADLs and to eat applesauce. With standing attempts using RW at bedside, pt exclaimed "Geronimo!" before launching himself forward requiring Total A x 3 to prevent fall and safely scoot hips back on bed. Due to extremely high fall risk and poor safety awareness, assisted pt back to bed for breakfast and deferred any attempts for transfers to chair. Continue to recommend SNF at DC as pt is unable to care for self safely.    Follow Up Recommendations  SNF;Supervision/Assistance - 24 hour    Equipment Recommendations  3 in 1 bedside commode;Wheelchair (measurements OT);Wheelchair cushion (measurements OT);Other (comment)    Recommendations for Other Services      Precautions / Restrictions Precautions Precautions: Fall;Other (comment) Precaution Comments: confused, new L BKA, EXTREMELY unsafe and impulsive with mobility, wound vac Restrictions Weight Bearing Restrictions: Yes LLE Weight  Bearing: Non weight bearing LLE Partial Weight Bearing Percentage or Pounds: L LE BKA Other Position/Activity Restrictions: L BKA       Mobility Bed Mobility Overal bed mobility: Needs Assistance Bed Mobility: Supine to Sit;Sit to Supine     Supine to sit: +2 for physical assistance;Total assist Sit to supine: Mod assist;+2 for physical assistance   General bed mobility comments: very little initiation noted, needed heavy physical assistance for all tasks today but once in sitting able to maintain midline sitting balance well    Transfers Overall transfer level: Needs assistance Equipment used: Rolling walker (2 wheeled) Transfers: Sit to/from Stand Sit to Stand: From elevated surface;Total assist (+3 for safety)         General transfer comment: when we tried to stand, he instead vigorously scooted his hips forward and almost threw himself onto the floor- requiring TOTAL ASSIST OF 3 PERSONS to prevent a fall and scoot him safely back into bed    Balance Overall balance assessment: Needs assistance Sitting-balance support: Feet supported;Bilateral upper extremity supported Sitting balance-Leahy Scale: Good Sitting balance - Comments: able to sit and eat applesauce in midline   Standing balance support: Bilateral upper extremity supported Standing balance-Leahy Scale: Zero Standing balance comment: high fall risk dynamically                           ADL either performed or assessed with clinical judgement   ADL Overall ADL's : Needs assistance/impaired Eating/Feeding: Set up;Bed level Eating/Feeding Details (indicate cue type and reason): Able to feed self applesauce sitting EOB, setup to cut up food,minor assist to manage  opening containers Grooming: Set up;Bed level;Brushing hair Grooming Details (indicate cue type and reason): had been brushing hair in bed on entry Upper Body Bathing: Minimal assistance;Sitting   Lower Body Bathing: Maximal  assistance;Bed level;Sitting/lateral leans   Upper Body Dressing : Minimal assistance;Bed level Upper Body Dressing Details (indicate cue type and reason): Min A to don clean gown, cues for sequencing Lower Body Dressing: Total assistance;Sitting/lateral leans;Bed level       Toileting- Clothing Manipulation and Hygiene: Total assistance;Bed level         General ADL Comments: Pt able to assist with UB ADLs, difficulty following directions but improved understanding of plan for session. Very poor safety awareness and impulsive increasing fall risk and requiring increased assist for LB ADLs.     Vision Baseline Vision/History: Wears glasses Patient Visual Report: No change from baseline Vision Assessment?: No apparent visual deficits   Perception     Praxis      Cognition Arousal/Alertness: Awake/alert Behavior During Therapy: Flat affect;Impulsive Overall Cognitive Status: Impaired/Different from baseline Area of Impairment: Following commands;Safety/judgement;Awareness;Problem solving;Attention                   Current Attention Level: Sustained   Following Commands: Follows one step commands inconsistently;Follows one step commands with increased time Safety/Judgement: Decreased awareness of safety;Decreased awareness of deficits Awareness: Intellectual Problem Solving: Slow processing;Decreased initiation;Difficulty sequencing;Requires verbal cues;Requires tactile cues General Comments: pleasant with therapy, but with extremely poor awareness/understanding of his situation- at one point tried to launch himself onto the floor when we attempted standing. Very unsafe and very high fall risk due to cognition. tangential conversation throughout, difficulty redirecting and perseverating on getting breakfast, etc        Exercises     Shoulder Instructions       General Comments one noted deat to 86% on RA. Pt endorses difficulty breathing inconsistently, cued for  pursed lip breathing    Pertinent Vitals/ Pain       Pain Assessment: Faces Faces Pain Scale: Hurts a little bit Pain Location: L BKA site Pain Descriptors / Indicators: Aching;Discomfort Pain Intervention(s): Monitored during session;Limited activity within patient's tolerance  Home Living Family/patient expects to be discharged to:: Private residence Living Arrangements: Alone Available Help at Discharge: Family;Available PRN/intermittently Type of Home: House Home Access: Stairs to enter [employer] of Steps: 5 Entrance Stairs-Rails: Right;Left Home Layout: One level     Bathroom Shower/Tub: Tub/shower unit;Walk-in shower   Bathroom Toilet: Standard Bathroom Accessibility: No   Home Equipment: None          Prior Functioning/Environment Level of Independence: Independent        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   Frequency  Min 2X/week        Progress Toward Goals  OT Goals(current goals can now be found in the care plan section)  Progress towards OT goals: Progressing toward goals  Acute Rehab OT Goals Patient Stated Goal: eat breakfast OT Goal Formulation: With patient Time For Goal Achievement: 03/21/21 Potential to Achieve Goals: Fair ADL Goals Pt Will Perform Grooming: with modified independence;sitting Pt Will Perform Lower Body Bathing: with min assist;sitting/lateral leans;sit to/from stand Pt Will Perform Lower Body Dressing: with min assist;sitting/lateral leans;sit to/from stand Pt Will Transfer to Toilet: with min assist;stand pivot transfer;bedside commode Additional ADL Goal #1: Pt to verbalize at least 2 fall prevention strategies Additional ADL Goal #2: Pt to complete bed mobility at Min A in prep for ADL  transfers  Plan      Co-evaluation    PT/OT/SLP Co-Evaluation/Treatment: Yes Reason for Co-Treatment: Complexity of the patient's impairments (multi-system involvement);Necessary to address  cognition/behavior during functional activity;For patient/therapist safety;To address functional/ADL transfers PT goals addressed during session: Mobility/safety with mobility OT goals addressed during session: ADL's and self-care      AM-PAC OT "6 Clicks" Daily Activity     Outcome Measure   Help from another person eating meals?: A Little Help from another person taking care of personal grooming?: A Little Help from another person toileting, which includes using toliet, bedpan, or urinal?: Total Help from another person bathing (including washing, rinsing, drying)?: A Lot Help from another person to put on and taking off regular upper body clothing?: A Little Help from another person to put on and taking off regular lower body clothing?: Total 6 Click Score: 13    End of Session Equipment Utilized During Treatment: Gait belt;Rolling walker  OT Visit Diagnosis: Unsteadiness on feet (R26.81);Other abnormalities of gait and mobility (R26.89);Muscle weakness (generalized) (M62.81);Other symptoms and signs involving cognitive function   Activity Tolerance Other (comment) (limited due to cognition)   Patient Left in bed;with call bell/phone within reach;with bed alarm set   Nurse Communication Mobility status        Time: 7591-6384 OT Time Calculation (min): 33 min  Charges: OT General Charges $OT Visit: 1 Visit OT Treatments $Self Care/Home Management : 8-22 mins  Bradd Canary, OTR/L Acute Rehab Services Office: [phone]   Lorre Munroe 03/07/2021, 10:01 AM

## 2021-03-07 NOTE — TOC Progression Note (Signed)
Transition of Care [facility]) - Progression Note    Patient Details  Name: Fernando Maddox MRN: [id number] Date of Birth: 07-23-1961  Transition of Care [facility]) CM/SW Contact  Mearl Latin, LCSW Phone Number: 03/07/2021, 3:32 PM  Clinical Narrative:    CSW updated [facility] that patient is not ready to discharge. They reported patient cannot admit over the weekend due to medical needs so likely would be Monday.    Expected Discharge Plan: Skilled Nursing Facility Barriers to Discharge: Continued Medical Work up  Expected Discharge Plan and Services Expected Discharge Plan: Skilled Nursing Facility   Discharge Planning Services: CM Consult   Living arrangements for the past 2 months: Single Family Home                                       Social Determinants of Health (SDOH) Interventions    Readmission Risk Interventions No flowsheet data found.

## 2021-03-07 NOTE — Progress Notes (Signed)
PROGRESS NOTE        PATIENT DETAILS Name: Fernando Maddox Age: 60 y.o. Sex: male Date of Birth: 12/06/1960 Admit Date: 02/14/2021 Admitting Physician Charlott Holler, MD PCP:Van Domenic Schwab, MD  Brief Narrative: Patient is a 60 y.o. male with history of HFrEF, permanent atrial fibrillation, CAD, DM-2, known PAD-s/p partial amputation of right great toe presented to [facility] as a transfer from [facility] ED for septic shock in the setting of critical right lower extremity ischemia.  Significant events: 4/21>> transferred to [facility] from [facility]- found down-gangrene is right lower extremity-CT angio performed which showed concern for acute right arterial occlusion. 4/24>> transfer to [facility]. 4/27>> left TMA 5/3>> fall after getting out of bed-bleeding from TMA site-with wound dehiscence. 5/5>> bleeding from amputation stump 5/6>> recurrent bleeding from amputation stump-stopped with compressive dressings.  Significant studies: 4/21>> CT angio abdominal aorta with iliofemoral runoff: No opacification beyond mid SFA-suspicious for arterial embolism.  L1, L2, L3 and L4 vertebral bodies compression fractures.  Mildly enlarged left retroperitoneal, pelvic and inguinal lymph nodes. 4/21>> CT chest: No acute pulmonary disease. 4/21>> CT head: No acute intracranial abnormality 4/21>> CT C-spine: No acute fractures. 4/22>> chest x-ray: No active disease 4/22>> right lower extremity arterial duplex: No evidence of stenosis or occlusive disease. 4/22>> Echo: EF 20-25%, RV systolic function severely reduced  Antimicrobial therapy: Vancomycin: 4/21>>4/24 Zosyn: 4/21>> 4/24 Unasyn: 4/25>>4/28  Microbiology data: None  Procedures : 4/27>> left transmetatarsal amputation  Consults: Vascular surgery, PCCM, orthopedics, cardiology  DVT Prophylaxis : IV heparin  Subjective:  Patient in bed, appears comfortable, denies any headache, no fever, no chest pain or pressure, no shortness of  breath , no abdominal pain. No new focal weakness. Mild L. Stump pain.   Assessment/Plan:  Septic shock due to critical left limb ischemia with left forefoot ulceration/gangrene s/p transmetatarsal amputation on 4/27: Sepsis physiology has resolved - all antimicrobial therapy discontinued on 02/21/21 for this issue, likely new Asp PNA post surgery 03/07/21 - Unasyn and monitor..   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  He continued to have subsequent episodes of bleeding.  Xarelto held. 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  Acute metabolic encephalopathy: Due to above-found unresponsive at home-improved.  Anemia: Due to combination of anemia of chronic disease, iron deficiency anemia and acute blood loss anemia from TMA stump bleeding.  Follow CBC-needs outpatient endoscopic evaluation.  Acute on chronic systolic/diastolic heart failure/new RV failure: Volume status better-cardiology following.  Currently compensated on low-dose beta-blocker.  As needed diuretics as needed.  Chronic atrial fibrillation with RVR: Rate controlled-on metoprolol-Xarelto at baseline, Hep gtt from 03/07/21.  Hyponatremia: likely SIADH, Ur OSM >> Sr OSM, gentle IVF 03/07/21 as new PNA with poor PO status, eventually may need Lasix/Samsca.  Transaminitis: LFTs have essentially normalized-no longer on amiodarone.  Acute hepatitis serology negative.  History of CAD: No anginal symptoms-not on aspirin as patient on anticoagulation  Prior history of polysubstance use: UDS 3 years back positive for amphetamine/cannabinoids.  ?  OSA/central sleep apnea: Noncompliant with it despite counseling, reconsult on 03/03/2021 outpatient sleep study and pulmonary follow-up to be arranged by PCP.  Goals of care: Noncompliant to medications-noncompliance to weightbearing status-very poor insight into his medical issues-poor  social economic conditions-probably has end-stage CHF-unfortunately  his overall long-term prognosis is very poor.  I do not think he is a candidate for aggressive therapies.  Evaluated by palliative care-DNR in place.  Will need ongoing goals of care discussion.  AKI with Hyperkalemia - IVF, Lokelma and Kayaxalate.  DM - 2 (A1c 7.7 on 4/25): CBGs at goal. Continue SSI. Resume metformin on discharge.  CBG (last 3)  Recent Labs    03/06/21 1701 03/06/21 2101 03/07/21 0739  GLUCAP 162* 167* 178*    Nutrition Problem: Nutrition Problem: Moderate Malnutrition Etiology: chronic illness (CHF) Signs/Symptoms: mild muscle depletion,moderate muscle depletion,mild fat depletion,moderate fat depletion Interventions: MVI,Glucerna shake,Magic cup   Diet: Diet Order            DIET SOFT Room service appropriate? Yes; Fluid consistency: Thin  Diet effective now                  Code Status: Full code   Family Communication:  Called Father - [name]-updated over the phone on 5/5-called on 5/7-voicemail not set up-unable to leave a voicemail, message left on 03/05/21 @ 9.16 am.  Called mother- [name] -updated over the phone on 4/29.  Disposition Plan: Status is: Inpatient  Remains inpatient appropriate because:Inpatient level of care appropriate due to severity of illness   Dispo: The patient is from: Home              Anticipated d/c is to: tbd              Patient currently is not medically stable to d/c.   Difficult to place patient No   Barriers to Discharge: ongoing diuresis-left BKA planned for Wednesday.   MEDICATIONS: Scheduled Meds: . vitamin C  1,000 mg Oral Daily  . Chlorhexidine Gluconate Cloth  6 each Topical Daily  . docusate sodium  100 mg Oral BID  . famotidine  20 mg Oral Daily  . feeding supplement (GLUCERNA SHAKE)  237 mL Oral BID BM  . insulin aspart  0-15 Units Subcutaneous TID WC  . insulin aspart  0-5 Units  Subcutaneous QHS  . mouth rinse  15 mL Mouth Rinse BID  . metoprolol succinate  12.5 mg Oral Daily  . midodrine  10 mg Oral TID WC  . multivitamin with minerals  1 tablet Oral Daily  . pantoprazole  40 mg Oral Daily  . sodium zirconium cyclosilicate  10 g Oral BID   Continuous Infusions: . heparin 3,150 Units/hr (03/07/21 0941)  . lactated ringers 10 mL/hr at 03/06/21 1203  . lactated ringers 100 mL/hr at 03/07/21 0938   PRN Meds:.acetaminophen **OR** [DISCONTINUED] acetaminophen, albuterol, ALPRAZolam, alum & mag hydroxide-simeth, bisacodyl, guaiFENesin-dextromethorphan, HYDROmorphone (DILAUDID) injection, magnesium citrate, [DISCONTINUED] ondansetron **OR** ondansetron (ZOFRAN) IV, oxyCODONE-acetaminophen, phenol, polyethylene glycol, zolpidem   PHYSICAL EXAM:  Vitals:   03/06/21 2236 03/07/21 0030 03/07/21 0500 03/07/21 0741  BP: 108/87 (!) 109/98 96/81 (!) 87/77  Pulse: 96   95  Resp: 19 20 19 16   Temp: (!) 96.1 F (35.6 C) (!) 97.2 F (36.2 C) (!) 97.1 F (36.2 C) 97.8 F (36.6 C)  TempSrc: Axillary Axillary Axillary Oral  SpO2: 97% 99% 98% 91%  Weight:      Height:       Filed Weights   03/04/21 0335 03/06/21 0344 03/06/21 1027  Weight: 98.3 kg 99.6 kg 99.6 kg   Body mass index is 27.45 kg/m.   Gen Exam:  Awake Alert, No new F.N deficits, Normal affect Esto.AT,PERRAL Supple Neck,No JVD, No cervical lymphadenopathy appriciated.  Symmetrical Chest wall movement, Good air movement bilaterally, few RLL rales RRR,No Gallops, Rubs or new Murmurs, No Parasternal Heave +ve B.Sounds, Abd Soft, No tenderness, No organomegaly appriciated, No rebound - guarding or rigidity. No Cyanosis, L. BKA stump with W.Vac  I have personally reviewed following labs and imaging studies  LABORATORY DATA:  Recent Labs  Lab 03/03/21 0043 03/04/21 0038 03/05/21 0030 03/06/21 0050 03/07/21 0047  WBC 5.3 5.9 7.5 8.8 9.2  HGB 9.6* 9.4* 9.9* 10.5* 10.8*  HCT 29.9* 29.2* 31.1* 32.0*  34.4*  PLT 235 267 231 270 376  MCV 86.2 85.6 86.6 86.0 88.2  MCH 27.7 27.6 27.6 28.2 27.7  MCHC 32.1 32.2 31.8 32.8 31.4  RDW 18.5* 18.6* 19.4* 20.4* 20.7*  LYMPHSABS  --  1.1 1.5 1.5 0.7  MONOABS  --  0.7 1.0 1.0 0.7  EOSABS  --  0.4 0.5 0.4 0.0  BASOSABS  --  0.1 0.1 0.2* 0.0    Recent Labs  Lab 03/01/21 0458 03/02/21 0032 03/03/21 0043 03/03/21 0905 03/04/21 0038 03/04/21 1028 03/05/21 0030 03/06/21 0050 03/07/21 0047  NA 130*   < > 129*  --  131*  --  130* 130* 130*  K 4.0   < > 4.4  --  4.0  --  4.0 4.3 5.7*  CL 92*   < > 93*  --  94*  --  95* 96* 96*  CO2 30   < > 29  --  30  --  27 24 17*  GLUCOSE 117*   < > 107*  --  120*  --  81 140* 174*  BUN 19   < > 22*  --  18  --  15 16 22*  CREATININE 1.12   < > 1.19  --  1.22  --  0.93 1.02 1.28*  CALCIUM 8.2*   < > 8.0*  --  8.3*  --  8.3* 8.6* 8.8*  AST 31  --   --   --  27  --  26 30 37  ALT 64*  --   --   --  40  --  36 33 35  ALKPHOS 100  --   --   --  92  --  99 102 94  BILITOT 1.4*  --   --   --  1.0  --  1.1 0.9 2.1*  ALBUMIN 2.3*  --   --   --  2.3*  --  2.3* 2.5* 2.6*  MG  --   --   --   --  1.7  --  1.8 2.0 2.1  INR  --   --   --   --   --  1.3*  --   --   --   BNP  --   --   --  1,086.7* 1,147.2*  --  946.5* 1,629.4* 1,730.2*   < > = values in this interval not displayed.     MICROBIOLOGY: Recent Results (from the past 240 hour(s))  SARS CORONAVIRUS 2 (TAT 6-24 HRS) Nasopharyngeal Nasopharyngeal Swab     Status: None   Collection Time: 02/25/21 12:53 PM   Specimen: Nasopharyngeal Swab  Result Value Ref Range Status   SARS Coronavirus 2 NEGATIVE NEGATIVE Final    Comment: (NOTE) SARS-CoV-2 target nucleic acids are NOT DETECTED.  The SARS-CoV-2 RNA is generally detectable in upper and lower respiratory specimens during the acute phase of infection. Negative results do not preclude SARS-CoV-2 infection, do not rule  out co-infections with other pathogens, and should not be used as the sole basis for  treatment or other patient management decisions. Negative results must be combined with clinical observations, patient history, and epidemiological information. The expected result is Negative.  Fact Sheet for Patients: HairSlick.no  Fact Sheet for Healthcare Providers: quierodirigir.com  This test is not yet approved or cleared by the [place] FDA and  has been authorized for detection and/or diagnosis of SARS-CoV-2 by FDA under an Emergency Use Authorization (EUA). This EUA will remain  in effect (meaning this test can be used) for the duration of the COVID-19 declaration under Se ction 564(b)(1) of the Act, 21 U.S.C. section 360bbb-3(b)(1), unless the authorization is terminated or revoked sooner.  Performed at [facility], [address], [place], [place] [address]     RADIOLOGY STUDIES/RESULTS:  DG Chest Port 1 View  Result Date: 03/07/2021 CLINICAL DATA:  Shortness of breath EXAM: PORTABLE CHEST 1 VIEW COMPARISON:  Mar 04, 2021 chest radiograph and chest CT February 03, 2021 FINDINGS: There is ill-defined airspace opacity in the right lower lung region. The lungs elsewhere are clear. There is cardiomegaly with pulmonary vascularity normal. There are foci of coronary artery calcification. No adenopathy. There is an old healed fracture of the left clavicle. Prior rib fractures bilaterally noted. Chronic separation right acromioclavicular joint noted. IMPRESSION: Ill-defined airspace opacity in the right lower lobe region concerning for developing pneumonia. Lungs elsewhere clear. Stable cardiomegaly. Electronically Signed   By: Bretta Bang III M.D.   On: 03/07/2021 08:50     LOS: 21 days   Signature  Susa Raring M.D on 03/07/2021 at 9:45 AM   -  To page go to [email or web address]

## 2021-03-07 NOTE — Progress Notes (Addendum)
Nutrition Follow-up  DOCUMENTATION CODES:   Not applicable  INTERVENTION:   -Continue Glucerna Shake po BID, each supplement provides 220 kcal and 10 grams of protein -Continue Magic cup TID with meals, each supplement provides 290 kcal and 9 grams of protein -Continue MVI with minerals daily  NUTRITION DIAGNOSIS:   Moderate Malnutrition related to chronic illness (CHF) as evidenced by mild muscle depletion,moderate muscle depletion,mild fat depletion,moderate fat depletion.  Ongoing  GOAL:   Patient will meet greater than or equal to 90% of their needs  Progressing   MONITOR:   PO intake,Supplement acceptance,Labs,Skin  REASON FOR ASSESSMENT:   Consult Assessment of nutrition requirement/status  ASSESSMENT:   60 yo male admitted with unresponsiveness, gangrene RLE, septic shock. PMH includes DM-2, HTN, CVA, A fib, CHF, CAD, substance abuse.  4/27- s/p RLEtransmetatarsal amputation 5/3- wound vac removed 5/11- s/p  PROCEDURE: Transtibial amputation Application of Prevena wound VAC  Reviewed I/O's: +145 ml x 24 hours and -10.1 L since 02/21/21  UOP: 975 ml x 24 hours  Pt unavailable at time of visit. Attempted to speak with pt via call to hospital room phone, however, unable to reach.   Pt remains with good appetite. Noted meal completions 75-100%. Pt is consuming Glucerna supplements.   Palliative continues to follow for goals of care discussions.   Medications reviewed and include vitamin C, colace, and lokelma.  Labs reviewed: Na: 130, K: 5.7, CBGS: 162-204 (inpatient orders for glycemic control are 0-15 units insulin aspart TID with meals and 0-5 units insulin aspart daily at bedtime).   Diet Order:   Diet Order            DIET SOFT Room service appropriate? No; Fluid consistency: Thin  Diet effective now                 EDUCATION NEEDS:   Education needs have been addressed  Skin:  Skin Assessment: Skin Integrity Issues: Skin Integrity  Issues:: Wound VAC DTI: sacrum Wound Vac: s/p lt BKA Other: -  Last BM:  03/05/21  Height:   Ht Readings from Last 1 Encounters:  03/06/21 6\' 3"  (1.905 m)    Weight:   Wt Readings from Last 1 Encounters:  03/06/21 99.6 kg    Ideal Body Weight:  83.3 kg (adjusted for lt BKA)  BMI:  Body mass index is 27.45 kg/m.  Estimated Nutritional Needs:   Kcal:  2500-2700  Protein:  140-160 gm  Fluid:  >/= 2.5 L    05/06/21, RD, LDN, CDCES Registered Dietitian II Certified Diabetes Care and Education Specialist Please refer to [facility] for RD and/or RD on-call/weekend/after hours pager

## 2021-03-07 NOTE — Progress Notes (Signed)
Pharmacy Antibiotic Note  Fernando Maddox is a 60 y.o. male admitted on 02/14/2021 with prolonged admission for RLE critical limb ischemia and LLE ulceration s/p L BKA 5/11.  Pt now with concern for aspiration pna.  Pharmacy has been consulted for Unasyn dosing.  Plan: Unasyn 3gm IV q6h Monitor clinical progress and renal function for dose adjustment.  Height: 6\' 3"  (190.5 cm) Weight: 99.6 kg (219 lb 9.3 oz) IBW/kg (Calculated) : 84.5  Temp (24hrs), Avg:96.9 F (36.1 C), Min:96 F (35.6 C), Max:97.8 F (36.6 C)  Recent Labs  Lab 03/03/21 0043 03/04/21 0038 03/05/21 0030 03/06/21 0050 03/07/21 0047  WBC 5.3 5.9 7.5 8.8 9.2  CREATININE 1.19 1.22 0.93 1.02 1.28*    Estimated Creatinine Clearance: 74.3 mL/min (A) (by C-G formula based on SCr of 1.28 mg/dL (H)).    Allergies  Allergen Reactions  . Codeine Itching  . Bee Venom Other (See Comments)    "Feels like his head is on fire when he goes outside in the sun"  . Hydrocodone-Acetaminophen Other (See Comments)  . Other Other (See Comments)    "Feels like his head is on fire when he goes outside in the sun"  . Cephalexin     No mention of this allergy in notes from 07/2016 admission for osteomyelitis.  Patient was treated with Ancef and Unasyn for broader anaerobic coverage.  Patient is not aware of allergy.  . Vicodin [Hydrocodone-Acetaminophen] Itching  . Zocor [Simvastatin] Other (See Comments)    Myalgias    Antimicrobials this admission: Vanc 4/22 >> 4/25 Zosyn 4/22 >> 4/25 Unasyn 4/25>>4/28  Dose adjustments this admission:   Microbiology results:   Thank you for allowing pharmacy to be a part of this patient's care.  5/25, Pharm.D., BCPS Clinical Pharmacist  **Pharmacist phone directory can be found on [email or web address] listed under [facility].  03/07/2021 10:20 AM

## 2021-03-07 NOTE — TOC Progression Note (Signed)
Transition of Care [facility]) - Progression Note    Patient Details  Name: Fernando Maddox MRN: [id number] Date of Birth: 06-21-1961  Transition of Care [facility]) CM/SW Contact  Mearl Latin, LCSW Phone Number: 03/07/2021, 3:30 PM  Clinical Narrative:    CSW continuing to follow.   Expected Discharge Plan: Skilled Nursing Facility Barriers to Discharge: Continued Medical Work up  Expected Discharge Plan and Services Expected Discharge Plan: Skilled Nursing Facility   Discharge Planning Services: CM Consult   Living arrangements for the past 2 months: Single Family Home                                       Social Determinants of Health (SDOH) Interventions    Readmission Risk Interventions No flowsheet data found.

## 2021-03-07 NOTE — Progress Notes (Signed)
Physical Therapy Treatment Patient Details Name: Fernando Maddox MRN: [id number] DOB: September 16, 1961 Today's Date: 03/07/2021    History of Present Illness Fernando Maddox is a 60 y.o. male who presents with necrotic ulceration and purulent drainage from the left forefoot involving the great toe second toe and third toe.  Patient is unsure when this started. On 4/27, pt underwent L transmetatarsal amputation. Patient is status post a partial amputation of the right great toe done 02/20/21. Unfortunately he has had ongoing issues with excessive bleeding and dehiscence of the amputation site, and ultiimately received a L BKA on 03/06/21. PMH: a fib, DM, GERD, CVA, drug abuse, HTN.    PT Comments    Patient received in bed, pleasantly confused and cooperative with therapies this morning. However, requires much heavier levels of physical assistance as compared to pre-BKA. Able to get to EOB with heavy physical assist, but when we tried standing he almost half scooted/half launched his hips forward off the edge of the bed and needed TOTAL ASSIST OF 3 PEOPLE to prevent a fall/get back into bed safely. Very distractible and with almost no awareness/understanding of his situation/safety. Attempted lateral scoots but still needed heavy physical assist due to cognition.  I really question if he is safe to be left up in the recliner by himself as he is an extremely high fall risk- would certainly require at least posey alarm seatbelt to safely attempt. Left in bed with all needs met, bed alarm active and RN aware of patient status. Continue to recommend SNF and 24/7 assist.     Follow Up Recommendations  SNF;Supervision/Assistance - 24 hour     Equipment Recommendations  Rolling walker with 5" wheels;Wheelchair (measurements PT);Wheelchair cushion (measurements PT);3in1 (PT);Hospital bed (drop arm commode)    Recommendations for Other Services       Precautions / Restrictions Precautions Precautions: Fall;Other  (comment) Precaution Comments: confused, new L BKA, EXTREMELY unsafe and impulsive with mobility, wound vac Restrictions Weight Bearing Restrictions: Yes LLE Weight Bearing: Non weight bearing LLE Partial Weight Bearing Percentage or Pounds: L LE BKA    Mobility  Bed Mobility Overal bed mobility: Needs Assistance Bed Mobility: Supine to Sit;Sit to Supine     Supine to sit: +2 for physical assistance;Total assist Sit to supine: Mod assist;+2 for physical assistance   General bed mobility comments: very little initiation noted, needed heavy physical assistance for all tasks today but once in sitting able to maintain midilne sitting well    Transfers Overall transfer level: Needs assistance Equipment used: Rolling walker (2 wheeled) Transfers: Sit to/from Stand Sit to Stand: From elevated surface;Total assist (+3 assist)         General transfer comment: when we tried to stand, he instead vigorously scooted his hips forward and almost threw himself onto the floor- requiring TOTAL ASSIST OF 3 PERSONS to prevent a fall and scoot him safely back into bed  Ambulation/Gait             General Gait Details: unable- safety   Stairs             Wheelchair Mobility    Modified Rankin (Stroke Patients Only)       Balance Overall balance assessment: Needs assistance Sitting-balance support: Feet supported;Bilateral upper extremity supported Sitting balance-Leahy Scale: Good Sitting balance - Comments: able to sit and eat applesauce in midline   Standing balance support: Bilateral upper extremity supported Standing balance-Leahy Scale: Zero Standing balance comment: high fall risk dynamically  Cognition Arousal/Alertness: Awake/alert Behavior During Therapy: Flat affect;Impulsive Overall Cognitive Status: Impaired/Different from baseline Area of Impairment: Following commands;Safety/judgement;Awareness;Problem  solving;Attention                   Current Attention Level: Sustained   Following Commands: Follows one step commands inconsistently;Follows one step commands with increased time Safety/Judgement: Decreased awareness of safety;Decreased awareness of deficits Awareness: Intellectual Problem Solving: Slow processing;Decreased initiation;Difficulty sequencing;Requires verbal cues;Requires tactile cues General Comments: pleasant with therapy, but with extremely poor awareness/understanding of his situation- at one point tried to launch himself onto the floor when we attempted standing. Very unsafe and very high fall risk due to cognition.      Exercises      General Comments General comments (skin integrity, edema, etc.): one time desat to 86% but quickly recovered to and maintained at  >90% on room air, slightly tachycardic to 113BPM at most      Pertinent Vitals/Pain Pain Assessment: Faces Faces Pain Scale: Hurts a little bit Pain Location: L BKA site Pain Descriptors / Indicators: Aching;Discomfort Pain Intervention(s): Limited activity within patient's tolerance;Monitored during session    Home Living                      Prior Function            PT Goals (current goals can now be found in the care plan section) Acute Rehab PT Goals Patient Stated Goal: none stated PT Goal Formulation: Patient unable to participate in goal setting Time For Goal Achievement: 03/21/21 Potential to Achieve Goals: Fair Progress towards PT goals: Progressing toward goals (goals revised after BKA)    Frequency    Min 2X/week      PT Plan Current plan remains appropriate;Equipment recommendations need to be updated    Co-evaluation   Reason for Co-Treatment: Complexity of the patient's impairments (multi-system involvement);Necessary to address cognition/behavior during functional activity;For patient/therapist safety;To address functional/ADL transfers PT goals  addressed during session: Mobility/safety with mobility        AM-PAC PT "6 Clicks" Mobility   Outcome Measure  Help needed turning from your back to your side while in a flat bed without using bedrails?: A Little Help needed moving from lying on your back to sitting on the side of a flat bed without using bedrails?: Total Help needed moving to and from a bed to a chair (including a wheelchair)?: Total Help needed standing up from a chair using your arms (e.g., wheelchair or bedside chair)?: Total Help needed to walk in hospital room?: Total Help needed climbing 3-5 steps with a railing? : Total 6 Click Score: 8    End of Session Equipment Utilized During Treatment: Gait belt Activity Tolerance: Patient tolerated treatment well Patient left: in bed;with call bell/phone within reach;with bed alarm set Nurse Communication: Mobility status;Need for lift equipment;Other (comment) (PT concerns about him being unsafe to sit in chair by himself/alone) PT Visit Diagnosis: Other abnormalities of gait and mobility (R26.89);Unsteadiness on feet (R26.81);Difficulty in walking, not elsewhere classified (R26.2)     Time: 2355-7322 PT Time Calculation (min) (ACUTE ONLY): 29 min  Charges:  $Therapeutic Activity: 8-22 mins (co-tx with OT)                     Windell Norfolk, DPT, PN1   Supplemental Physical Therapist [place]    Pager [phone] Acute Rehab Office [phone]

## 2021-03-08 ENCOUNTER — Inpatient Hospital Stay (HOSPITAL_COMMUNITY): Payer: Medicare Other

## 2021-03-08 DIAGNOSIS — L97524 Non-pressure chronic ulcer of other part of left foot with necrosis of bone: Secondary | ICD-10-CM | POA: Diagnosis not present

## 2021-03-08 LAB — CBC WITH DIFFERENTIAL/PLATELET
Abs Immature Granulocytes: 0.09 10*3/uL — ABNORMAL HIGH (ref 0.00–0.07)
Basophils Absolute: 0.1 10*3/uL (ref 0.0–0.1)
Basophils Relative: 1 %
Eosinophils Absolute: 0.1 10*3/uL (ref 0.0–0.5)
Eosinophils Relative: 0 %
HCT: 30.3 % — ABNORMAL LOW (ref 39.0–52.0)
Hemoglobin: 9.8 g/dL — ABNORMAL LOW (ref 13.0–17.0)
Immature Granulocytes: 1 %
Lymphocytes Relative: 17 %
Lymphs Abs: 2 10*3/uL (ref 0.7–4.0)
MCH: 28.2 pg (ref 26.0–34.0)
MCHC: 32.3 g/dL (ref 30.0–36.0)
MCV: 87.3 fL (ref 80.0–100.0)
Monocytes Absolute: 1.5 10*3/uL — ABNORMAL HIGH (ref 0.1–1.0)
Monocytes Relative: 13 %
Neutro Abs: 7.9 10*3/uL — ABNORMAL HIGH (ref 1.7–7.7)
Neutrophils Relative %: 68 %
Platelets: 338 10*3/uL (ref 150–400)
RBC: 3.47 MIL/uL — ABNORMAL LOW (ref 4.22–5.81)
RDW: 21 % — ABNORMAL HIGH (ref 11.5–15.5)
WBC: 11.6 10*3/uL — ABNORMAL HIGH (ref 4.0–10.5)
nRBC: 0 % (ref 0.0–0.2)

## 2021-03-08 LAB — GLUCOSE, CAPILLARY
Glucose-Capillary: 132 mg/dL — ABNORMAL HIGH (ref 70–99)
Glucose-Capillary: 197 mg/dL — ABNORMAL HIGH (ref 70–99)
Glucose-Capillary: 140 mg/dL — ABNORMAL HIGH (ref 70–99)
Glucose-Capillary: 137 mg/dL — ABNORMAL HIGH (ref 70–99)

## 2021-03-08 LAB — HEPARIN LEVEL (UNFRACTIONATED): Heparin Unfractionated: 0.89 IU/mL — ABNORMAL HIGH (ref 0.30–0.70)

## 2021-03-08 LAB — COMPREHENSIVE METABOLIC PANEL
ALT: 42 U/L (ref 0–44)
AST: 49 U/L — ABNORMAL HIGH (ref 15–41)
Albumin: 2.5 g/dL — ABNORMAL LOW (ref 3.5–5.0)
Alkaline Phosphatase: 85 U/L (ref 38–126)
Anion gap: 11 (ref 5–15)
BUN: 26 mg/dL — ABNORMAL HIGH (ref 6–20)
CO2: 24 mmol/L (ref 22–32)
Calcium: 8.6 mg/dL — ABNORMAL LOW (ref 8.9–10.3)
Chloride: 97 mmol/L — ABNORMAL LOW (ref 98–111)
Creatinine, Ser: 1.15 mg/dL (ref 0.61–1.24)
GFR, Estimated: >60 mL/min (ref >60)
Glucose, Bld: 144 mg/dL — ABNORMAL HIGH (ref 70–99)
Potassium: 4.3 mmol/L (ref 3.5–5.1)
Sodium: 132 mmol/L — ABNORMAL LOW (ref 135–145)
Total Bilirubin: 1.4 mg/dL — ABNORMAL HIGH (ref 0.3–1.2)
Total Protein: 6.5 g/dL (ref 6.5–8.1)

## 2021-03-08 LAB — SURGICAL PATHOLOGY

## 2021-03-08 LAB — MAGNESIUM: Magnesium: 2 mg/dL (ref 1.7–2.4)

## 2021-03-08 LAB — BRAIN NATRIURETIC PEPTIDE: B Natriuretic Peptide: 1333.1 pg/mL — ABNORMAL HIGH (ref 0.0–100.0)

## 2021-03-08 LAB — PROCALCITONIN: Procalcitonin: 0.15 ng/mL

## 2021-03-08 LAB — SARS CORONAVIRUS 2 (TAT 6-24 HRS): SARS Coronavirus 2: NEGATIVE

## 2021-03-08 MED ORDER — RIVAROXABAN 20 MG PO TABS
20.0000 mg | ORAL_TABLET | Freq: Every day | ORAL | Status: DC
  Administered 2021-03-08: 20 mg via ORAL
  Filled 2021-03-08: qty 1

## 2021-03-08 MED ORDER — METOPROLOL SUCCINATE ER 25 MG PO TB24: 12.5000 mg | ORAL_TABLET | Freq: Every day | ORAL | Status: DC

## 2021-03-08 MED ORDER — AMOXICILLIN-POT CLAVULANATE 875-125 MG PO TABS: 1.0000 | ORAL_TABLET | Freq: Two times a day (BID) | ORAL | 0 refills | Status: AC

## 2021-03-08 MED ORDER — ACETAMINOPHEN 325 MG PO TABS: 650.0000 mg | ORAL_TABLET | Freq: Four times a day (QID) | ORAL | Status: AC | PRN

## 2021-03-08 MED ORDER — PANTOPRAZOLE SODIUM 40 MG PO TBEC: 40.0000 mg | DELAYED_RELEASE_TABLET | Freq: Every day | ORAL | Status: AC

## 2021-03-08 MED ORDER — MIDODRINE HCL 10 MG PO TABS: 10.0000 mg | ORAL_TABLET | Freq: Three times a day (TID) | ORAL | Status: DC

## 2021-03-08 MED ORDER — ALBUTEROL SULFATE (2.5 MG/3ML) 0.083% IN NEBU: 3.0000 mL | INHALATION_SOLUTION | RESPIRATORY_TRACT | 12 refills | Status: AC | PRN

## 2021-03-08 MED ORDER — OXYCODONE-ACETAMINOPHEN 5-325 MG PO TABS: 1.0000 | ORAL_TABLET | Freq: Three times a day (TID) | ORAL | 0 refills | Status: DC | PRN

## 2021-03-08 NOTE — Progress Notes (Signed)
Postop day 2 status post below-knee amputation.  He is doing well.  Lying in bed  Wound VAC is functioning with 1 green check patient has his limb protector and shrinker.  Will need discharge to SNF we will need to follow-up with orthopedics in 1 week after discharge.  From an orthopedic standpoint is stable

## 2021-03-08 NOTE — Plan of Care (Signed)
  Problem: Education: Goal: Knowledge of General Education information will improve Description Including pain rating scale, medication(s)/side effects and non-pharmacologic comfort measures Outcome: Adequate for Discharge   Problem: Health Behavior/Discharge Planning: Goal: Ability to manage health-related needs will improve Outcome: Adequate for Discharge   Problem: Clinical Measurements: Goal: Ability to maintain clinical measurements within normal limits will improve Outcome: Adequate for Discharge Goal: Will remain free from infection Outcome: Adequate for Discharge Goal: Diagnostic test results will improve Outcome: Adequate for Discharge Goal: Respiratory complications will improve Outcome: Adequate for Discharge Goal: Cardiovascular complication will be avoided Outcome: Adequate for Discharge   Problem: Activity: Goal: Risk for activity intolerance will decrease Outcome: Adequate for Discharge   Problem: Nutrition: Goal: Adequate nutrition will be maintained Outcome: Adequate for Discharge   Problem: Coping: Goal: Level of anxiety will decrease Outcome: Adequate for Discharge   Problem: Elimination: Goal: Will not experience complications related to bowel motility Outcome: Adequate for Discharge Goal: Will not experience complications related to urinary retention Outcome: Adequate for Discharge   Problem: Pain Managment: Goal: General experience of comfort will improve Outcome: Adequate for Discharge   Problem: Safety: Goal: Ability to remain free from injury will improve Outcome: Adequate for Discharge   Problem: Skin Integrity: Goal: Risk for impaired skin integrity will decrease Outcome: Adequate for Discharge   Problem: Education: Goal: Knowledge of the prescribed therapeutic regimen will improve Outcome: Adequate for Discharge Goal: Ability to verbalize activity precautions or restrictions will improve Outcome: Adequate for Discharge Goal:  Understanding of discharge needs will improve Outcome: Adequate for Discharge   Problem: Activity: Goal: Ability to perform//tolerate increased activity and mobilize with assistive devices will improve Outcome: Adequate for Discharge   Problem: Clinical Measurements: Goal: Postoperative complications will be avoided or minimized Outcome: Adequate for Discharge   Problem: Self-Care: Goal: Ability to meet self-care needs will improve Outcome: Adequate for Discharge   Problem: Self-Concept: Goal: Ability to maintain and perform role responsibilities to the fullest extent possible will improve Outcome: Adequate for Discharge   Problem: Pain Management: Goal: Pain level will decrease with appropriate interventions Outcome: Adequate for Discharge   Problem: Skin Integrity: Goal: Demonstration of wound healing without infection will improve Outcome: Adequate for Discharge   

## 2021-03-08 NOTE — Progress Notes (Signed)
Report called to Serina, RN at [name].  

## 2021-03-08 NOTE — Discharge Instructions (Signed)
Follow with Primary MD Leonia Reader, Barbara Cower, MD in 7 days   Get CBC, CMP, 2 view Chest X ray -  checked next visit within 1 week by SNF MD   Activity: As tolerated with Full fall precautions use walker/cane & assistance as needed  Disposition SNF  Diet: Soft with feeding assistance and aspiration precautions.  Special Instructions: If you have smoked or chewed Tobacco  in the last 2 yrs please stop smoking, stop any regular Alcohol  and or any Recreational drug use.  On your next visit with your primary care physician please Get Medicines reviewed and adjusted.  Please request your Prim.MD to go over all Hospital Tests and Procedure/Radiological results at the follow up, please get all Hospital records sent to your Prim MD by signing hospital release before you go home.  If you experience worsening of your admission symptoms, develop shortness of breath, life threatening emergency, suicidal or homicidal thoughts you must seek medical attention immediately by calling 911 or calling your MD immediately  if symptoms less severe.  You Must read complete instructions/literature along with all the possible adverse reactions/side effects for all the Medicines you take and that have been prescribed to you. Take any new Medicines after you have completely understood and accpet all the possible adverse reactions/side effects.       Information on my medicine - XARELTO (Rivaroxaban)  This medication education was reviewed with me or my healthcare representative as part of my discharge preparation.  The pharmacist that spoke with me during my hospital stay was:    Why was Xarelto prescribed for you? Xarelto was prescribed for you to reduce the risk of a blood clot forming that can cause a stroke if you have a medical condition called atrial fibrillation (a type of irregular heartbeat).  What do you need to know about xarelto ? Take your Xarelto ONCE DAILY at the same time every day with your  evening meal. If you have difficulty swallowing the tablet whole, you may crush it and mix in applesauce just prior to taking your dose.  Take Xarelto exactly as prescribed by your doctor and DO NOT stop taking Xarelto without talking to the doctor who prescribed the medication.  Stopping without other stroke prevention medication to take the place of Xarelto may increase your risk of developing a clot that causes a stroke.  Refill your prescription before you run out.  After discharge, you should have regular check-up appointments with your healthcare provider that is prescribing your Xarelto.  In the future your dose may need to be changed if your kidney function or weight changes by a significant amount.  What do you do if you miss a dose? If you are taking Xarelto ONCE DAILY and you miss a dose, take it as soon as you remember on the same day then continue your regularly scheduled once daily regimen the next day. Do not take two doses of Xarelto at the same time or on the same day.   Important Safety Information A possible side effect of Xarelto is bleeding. You should call your healthcare provider right away if you experience any of the following: ? Bleeding from an injury or your nose that does not stop. ? Unusual colored urine (red or dark brown) or unusual colored stools (red or black). ? Unusual bruising for unknown reasons. ? A serious fall or if you hit your head (even if there is no bleeding).  Some medicines may interact with Xarelto and might  increase your risk of bleeding while on Xarelto. To help avoid this, consult your healthcare provider or pharmacist prior to using any new prescription or non-prescription medications, including herbals, vitamins, non-steroidal anti-inflammatory drugs (NSAIDs) and supplements.  This website has more information on Xarelto: [email or web address].com.br.

## 2021-03-08 NOTE — Discharge Summary (Addendum)
Colen Eltzroth Shifrin ZOX:[id number] DOB: [birth year] DOA: 02/14/2021  PCP: Crist Fat, MD  Admit date: 02/14/2021  Discharge date: 03/09/2021  Admitted From: Home   Disposition:  SNF   Recommendations for Outpatient Follow-up:   Follow up with PCP in 1-2 weeks  PCP Please obtain BMP/CBC, 2 view CXR in 1week,  (see Discharge instructions)   PCP Please follow up on the following pending results:     Home Health: None  Equipment/Devices: None  Consultations: Ortho, VVS, Cards Discharge Condition: Stable    CODE STATUS: Full    Diet Recommendation: Soft consistency heart Healthy with 1.5 L/day total fluid restriction.  CC - L foot pain  Brief history of present illness from the day of admission and additional interim summary    Patient is a 60 y.o. male with history of HFrEF, permanent atrial fibrillation, CAD, DM-2, known PAD-s/p partial amputation of right great toe presented to [facility] as a transfer from [facility] ED for septic shock in the setting of critical right lower extremity ischemia.  Significant events: 4/21>> transferred to [facility] from [facility]- found down-gangrene is right lower extremity-CT angio performed which showed concern for acute right arterial occlusion. 4/24>> transfer to [facility]. 4/27>> left TMA 5/3>> fall after getting out of bed-bleeding from TMA site-with wound dehiscence. 5/5>> bleeding from amputation stump 5/6>> recurrent bleeding from amputation stump-stopped with compressive dressings.  Significant studies: 4/21>> CT angio abdominal aorta with iliofemoral runoff: No opacification beyond mid SFA-suspicious for arterial embolism.  L1, L2, L3 and L4 vertebral bodies compression fractures.  Mildly enlarged left retroperitoneal, pelvic and inguinal lymph nodes. 4/21>> CT chest: No acute pulmonary  disease. 4/21>> CT head: No acute intracranial abnormality 4/21>> CT C-spine: No acute fractures. 4/22>> chest x-ray: No active disease 4/22>> right lower extremity arterial duplex: No evidence of stenosis or occlusive disease. 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  He has finished antibiotics for this problem.  Aspiration PNA during perio operative period around 03/07/2021.  On soft diet, will need continued speech follow-up at SNF, is on 5 more days of oral Augmentin.  Clinically stable.  Please minimize narcotics and benzodiazepines use, and speech to follow at SNF.  Clinically stable.  Counseled to have all meals in  sitting upright position.  Anemia: Due to combination of anemia of chronic disease, iron deficiency anemia and acute blood loss anemia from TMA stump bleeding.  Follow CBC-intermittently at SNF.  Acute on chronic systolic/diastolic heart failure/new RV failure: Volume status better- seen by cardiology. Currently compensated on low-dose beta-blocker.  Monitor volume status at SNF, will need diuretics on a as needed basis, home dose Lasix held due to soft blood pressures.  Chronic atrial fibrillation with RVR [place] VASc 2 score of greater than 3: Rate controlled-on metoprolol low dose - Xarelto .  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  Transaminitis:  LFTs have essentially normalized-no longer on amiodarone.  Acute hepatitis serology negative.  HTN - BP soft, need high dose midodrine 3 times a day to prop up his blood pressure, blood pressure is not improving hence I have titrated down midodrine and increased beta-blocker dose on 03/09/2021.  At SNF goal should be to get him off of midodrine and then start trial of diuretics with close monitoring.  History of CAD: No anginal symptoms-not on aspirin as patient on anticoagulation  Prior history of polysubstance use: UDS 3 years back positive for amphetamine/cannabinoids.  ?  OSA/central sleep apnea: Noncompliant with it despite counseling, reconsult on 03/03/2021 outpatient sleep study and pulmonary follow-up to be arranged by PCP.  Currently continue to be 2 lits  Oxygen QHS scheduled and PRN am.  DM - 2 (A1c 7.7 on 4/25): CBGs at goal. Continue SSI. Resume metformin on discharge.   Goals of care: Noncompliant to medications-noncompliance to weightbearing status-very poor insight into his medical issues-poor social economic conditions-probably has end-stage CHF-unfortunately his overall long-term prognosis is very poor.  I do not think he is a candidate for aggressive therapies.  Evaluated by palliative care-DNR in place.  Will need ongoing goals of care discussion.   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)   Foot ulcer (HCC)   Acute combined systolic and diastolic congestive heart failure (HCC)   Hyponatremia   Dehiscence of amputation stump (HCC)    Discharge instructions    Discharge Instructions    Diet - low sodium heart healthy   Complete by: As directed    Discharge instructions   Complete by: As directed    Follow with Primary MD Leonia Reader, Barbara Cower, MD in 7 days   Get CBC, CMP, 2 view Chest X ray -  checked next visit within 1 week by SNF MD   Activity: As tolerated with Full fall precautions use  walker/cane & assistance as needed  Disposition SNF  Diet: Soft with feeding assistance and aspiration precautions.  Special Instructions: If you have smoked or chewed Tobacco  in the last 2 yrs please stop smoking, stop any regular Alcohol  and or any Recreational drug use.  On your next visit with your primary care physician please Get Medicines reviewed and adjusted.  Please request your Prim.MD to go over all Hospital Tests and Procedure/Radiological results at the follow up, please get all Hospital records sent to your Prim MD by signing hospital release before you go home.  If you experience worsening of your admission symptoms, develop shortness of breath, life threatening emergency, suicidal or homicidal thoughts you must seek medical attention immediately by calling 911 or calling your MD immediately  if symptoms less severe.  You Must read  complete instructions/literature along with all the possible adverse reactions/side effects for all the Medicines you take and that have been prescribed to you. Take any new Medicines after you have completely understood and accpet all the possible adverse reactions/side effects.   Discharge wound care:   Complete by: As directed    W Vac to continue, follow with Dr Lajoyce Corners in 1 week, Call his office if any issues.   Increase activity slowly   Complete by: As directed    Negative Pressure Wound Therapy - Incisional   Complete by: As directed    Show patient how to attach vac. Should call office if alarms   Negative Pressure Wound Therapy - Incisional   Complete by: As directed    Show patient how to attach vac. Should call office if alarms      Discharge Medications   Allergies as of 03/09/2021      Reactions   Codeine Itching   Bee Venom Other (See Comments)   "Feels like his head is on fire when he goes outside in the sun"   Hydrocodone-acetaminophen Other (See Comments)   Other Other (See Comments)   "Feels like his head is on fire  when he goes outside in the sun"   Cephalexin    No mention of this allergy in notes from 07/2016 admission for osteomyelitis.  Patient was treated with Ancef and Unasyn for broader anaerobic coverage.  Patient is not aware of allergy.   Vicodin [hydrocodone-acetaminophen] Itching   Zocor [simvastatin] Other (See Comments)   Myalgias      Medication List    STOP taking these medications   furosemide 40 MG tablet Commonly known as: LASIX   spironolactone 25 MG tablet Commonly known as: ALDACTONE     TAKE these medications   acetaminophen 325 MG tablet Commonly known as: TYLENOL Take 2 tablets (650 mg total) by mouth every 6 (six) hours as needed for mild pain (or Fever >/= 101).   albuterol (2.5 MG/3ML) 0.083% nebulizer solution Commonly known as: PROVENTIL Inhale 3 mLs into the lungs every 4 (four) hours as needed for wheezing or shortness of breath.   amoxicillin-clavulanate 875-125 MG tablet Commonly known as: Augmentin Take 1 tablet by mouth 2 (two) times daily for 5 days.   folic acid 1 MG tablet Commonly known as: FOLVITE Take 1 mg by mouth daily.   metFORMIN 500 MG tablet Commonly known as: GLUCOPHAGE Take 500 mg by mouth 2 (two) times daily.   metoprolol succinate 25 MG 24 hr tablet Commonly known as: TOPROL-XL Take 1 tablet (25 mg total) by mouth daily.   midodrine 5 MG tablet Commonly known as: PROAMATINE Take 1 tablet (5 mg total) by mouth 3 (three) times daily with meals.   oxyCODONE-acetaminophen 5-325 MG tablet Commonly known as: PERCOCET/ROXICET Take 1 tablet by mouth every 8 (eight) hours as needed for severe pain.   pantoprazole 40 MG tablet Commonly known as: PROTONIX Take 1 tablet (40 mg total) by mouth daily.   rivaroxaban 20 MG Tabs tablet Commonly known as: XARELTO Take 20 mg by mouth daily with supper.            Discharge Care Instructions  (From admission, onward)         Start     Ordered   03/08/21 0000  Discharge wound  care:       Comments: W Vac to continue, follow with Dr Lajoyce Corners in 1 week, Call his office if any issues.  03/08/21 1610           Contact information for follow-up providers    Persons, [place], [place] In 1 week.   Specialty: Orthopedic Surgery Contact information: [address] [place] [address] [phone]        Adonis Huguenin, NP In 1 week.   Specialty: Orthopedic Surgery Contact information: [address] [place] [address] [phone]        Leonia Reader, Barbara Cower, MD. Schedule an appointment as soon as possible for a visit in 1 week(s).   Specialty: Internal Medicine Contact information: [address] [place] [address] [phone]        Garwin Brothers, MD. Go on 04/08/2021.   Specialty: Cardiology Why: @10am  for hospital follow up. Please call if need to reschedule  Contact information: [address] [place] [address] [phone]            Contact information for after-discharge care    Destination    [facility] .   Service: Skilled Nursing Contact information: [address] [place] [address] [phone]                  Major procedures and Radiology Reports - PLEASE review detailed and final reports thoroughly  -       DG Chest 1 View  Result Date: 02/15/2021 CLINICAL DATA:  Respiratory failure. EXAM: CHEST  1 VIEW COMPARISON:  February 14, 2021. FINDINGS: Stable cardiomegaly. No pneumothorax or pleural effusion is noted. Stable position of right internal jugular catheter line. No acute pulmonary disease is noted. Old bilateral rib fractures are noted. IMPRESSION: No active disease. Electronically Signed   By: Lupita Raider M.D.   On: 02/15/2021 15:47   DG Chest Port 1 View  Result Date: 03/07/2021 CLINICAL DATA:  Shortness of breath EXAM: PORTABLE CHEST 1 VIEW COMPARISON:  Mar 04, 2021 chest radiograph and chest CT February 03, 2021 FINDINGS: There is ill-defined airspace  opacity in the right lower lung region. The lungs elsewhere are clear. There is cardiomegaly with pulmonary vascularity normal. There are foci of coronary artery calcification. No adenopathy. There is an old healed fracture of the left clavicle. Prior rib fractures bilaterally noted. Chronic separation right acromioclavicular joint noted. IMPRESSION: Ill-defined airspace opacity in the right lower lobe region concerning for developing pneumonia. Lungs elsewhere clear. Stable cardiomegaly. Electronically Signed   By: Bretta Bang III M.D.   On: 03/07/2021 08:50   DG Chest Port 1 View  Result Date: 03/04/2021 CLINICAL DATA:  Shortness of breath EXAM: PORTABLE CHEST 1 VIEW COMPARISON:  03/03/2021 FINDINGS: No new consolidation or edema. No pleural effusion. No pneumothorax. Stable cardiomegaly. IMPRESSION: No acute process in the chest.  Cardiomegaly. Electronically Signed   By: Guadlupe Spanish M.D.   On: 03/04/2021 08:23   DG Chest Port 1 View  Result Date: 03/03/2021 CLINICAL DATA:  Shortness of breath EXAM: PORTABLE CHEST 1 VIEW COMPARISON:  Chest radiograph dated 02/26/2021. FINDINGS: The heart is enlarged. The lungs are clear. Degenerative changes are seen in the spine. Chronic right-sided rib fractures and a chronic left clavicle deformity are redemonstrated. IMPRESSION: No active disease.  Cardiomegaly. Electronically Signed   By: Romona Curls M.D.   On: 03/03/2021 12:32   DG Chest Port 1 View  Result Date: 02/26/2021 CLINICAL DATA:  Shortness of breath EXAM: PORTABLE CHEST 1 VIEW COMPARISON:  02/18/2021 chest radiograph FINDINGS: Unchanged, enlarged cardiac silhouette. There is no new  focal airspace disease. No pleural effusion or pneumothorax. No acute osseous abnormality. Old, healed left clavicle injury. Chronic bilateral rib fractures noted. IMPRESSION: Unchanged cardiomegaly.  No new focal airspace disease Electronically Signed   By: Caprice Renshaw   On: 02/26/2021 10:12   DG CHEST PORT 1  VIEW  Result Date: 02/18/2021 CLINICAL DATA:  Shortness of breath, weakness, and fatigue. EXAM: PORTABLE CHEST 1 VIEW COMPARISON:  02/15/2021 FINDINGS: The right central venous catheter has been removed. Cardiac enlargement. No vascular congestion, edema, or consolidation. No pleural effusions. No pneumothorax. Mediastinal contours appear intact. Old bilateral rib fractures. IMPRESSION: Cardiac enlargement.  No evidence of active pulmonary disease. Electronically Signed   By: Burman Nieves M.D.   On: 02/18/2021 20:25   DG Foot 2 Views Left  Result Date: 02/15/2021 CLINICAL DATA:  Foot ulcer. EXAM: LEFT FOOT - 2 VIEW COMPARISON:  None. FINDINGS: There is no evidence of fracture or dislocation. Severe irregularity is seen involving the first interphalangeal joint with lytic destruction of the adjacent articular surfaces of the proximal distal phalanges. This is concerning for possible septic arthritis with adjacent osteomyelitis. Soft tissue swelling of the first toe is noted suggesting cellulitis. Degenerative changes are also seen involving the distal interphalangeal joints of the second and third toes. IMPRESSION: Findings concerning for possible septic arthritis of the first interphalangeal joint with possible osteomyelitis of the adjacent proximal distal phalanges. MRI may be performed further evaluation. Electronically Signed   By: Lupita Raider M.D.   On: 02/15/2021 15:50   VAS [place] ABI WITH/WO TBI  Result Date: 02/15/2021  LOWER EXTREMITY DOPPLER STUDY Patient Name:  AUGUST ROGINSKI  Date of Exam:   02/15/2021 Medical Rec #: [id number]      Accession #:    [id number] Date of Birth: 1961/08/24      Patient Gender: M Patient Age:   59Y Exam Location:  [facility] Procedure:      VAS [place] ABI WITH/WO TBI Referring Phys: [id number] Nada Libman --------------------------------------------------------------------------------  High Risk Factors: Hypertension, hyperlipidemia, Diabetes, current smoker,                     coronary artery disease, prior CVA. Other Factors: CHF, Afib, Polysubstance abuse.  Comparison Study: Previous exam 04/15/18 -WNL Performing Technologist: Ernestene Mention RVT, RDMS  Examination Guidelines: A complete evaluation includes at minimum, Doppler waveform signals and systolic blood pressure reading at the level of bilateral brachial, anterior tibial, and posterior tibial arteries, when vessel segments are accessible. Bilateral testing is considered an integral part of a complete examination. Photoelectric Plethysmograph (PPG) waveforms and toe systolic pressure readings are included as required and additional duplex testing as needed. Limited examinations for reoccurring indications may be performed as noted.  ABI Findings: +--------+------------------+-----+----------------+--------+ Right   Rt Pressure (mmHg)IndexWaveform        Comment  +--------+------------------+-----+----------------+--------+ [id number]                    biphasic                 +--------+------------------+-----+----------------+--------+ PTA     117               1.04 audibly biphasic         +--------+------------------+-----+----------------+--------+ DP      111               0.98 audibly biphasic         +--------+------------------+-----+----------------+--------+ +--------+------------------+-----+----------------+-------+ Left  Lt Pressure (mmHg)IndexWaveform        Comment +--------+------------------+-----+----------------+-------+ [id number]                    triphasic               +--------+------------------+-----+----------------+-------+ PTA     106               0.94 audibly biphasic        +--------+------------------+-----+----------------+-------+ DP      115               1.02 audibly biphasic        +--------+------------------+-----+----------------+-------+ +-------+-----------+-----------+------------+------------+ ABI/TBIToday's ABIToday's  TBIPrevious ABIPrevious TBI +-------+-----------+-----------+------------+------------+ Right  1.04       0          1.23                     +-------+-----------+-----------+------------+------------+ Left   1.02       0          1.15                     +-------+-----------+-----------+------------+------------+ Doppler readings are unreliable due to patient constantly moving and Afib.  Summary: Right: Resting right ankle-brachial index is within normal range. No evidence of significant right lower extremity arterial disease. The right toe-brachial index is absent. Left: Resting left ankle-brachial index is within normal range. No evidence of significant left lower extremity arterial disease. The left toe-brachial index is absent.  *See table(s) above for measurements and observations.  Electronically signed by Heath Lark on 02/15/2021 at 5:25:40 PM.    Final    ECHOCARDIOGRAM COMPLETE  Result Date: 02/15/2021    ECHOCARDIOGRAM REPORT   Patient Name:   DELRICK DEHART Date of Exam: 02/15/2021 Medical Rec #:  [id number]     Height:       75.0 in Accession #:    [id number]    Weight:       212.5 lb Date of Birth:  Feb 21, 1961     BSA:          2.252 m Patient Age:    59 years      BP:           100/72 mmHg Patient Gender: M             HR:           103 bpm. Exam Location:  Inpatient Procedure: 2D Echo, Color Doppler, Cardiac Doppler and Intracardiac            Opacification Agent Indications:    Stroke  History:        Patient has no prior history of Echocardiogram examinations.                 CHF, Abnormal ECG, Stroke, Arrythmias:Atrial Fibrillation,                 Signs/Symptoms:Chest Pain, Shortness of Breath and Dyspnea; Risk                 Factors:Current Smoker, Diabetes, Hypertension and Dyslipidemia.                 Septic shock.  Sonographer:    Sheralyn Boatman RDCS Referring Phys: [id number] Olene Craven DESAI  Sonographer Comments: Technically difficult study due to poor echo windows and echo  performed with patient supine and on artificial respirator. IMPRESSIONS  1. Left ventricular ejection fraction,  by estimation, is 20 to 25%. The left ventricle has severely decreased function. The left ventricle demonstrates global hypokinesis. The left ventricular internal cavity size was moderately dilated. Left ventricular diastolic function could not be evaluated. There is the interventricular septum is flattened in systole and diastole, consistent with right ventricular pressure and volume overload.  2. Right ventricular systolic function is severely reduced. The right ventricular size is moderately enlarged.  3. Left atrial size was moderately dilated.  4. Right atrial size was moderately dilated.  5. The mitral valve is normal in structure. Mild to moderate mitral valve regurgitation.  6. Tricuspid valve regurgitation is mild to moderate.  7. The aortic valve is tricuspid. There is mild calcification of the aortic valve. Aortic valve regurgitation is not visualized.  8. Aortic dilatation noted. There is borderline dilatation of the ascending aorta, measuring 40 mm.  9. The inferior vena cava is dilated in size with <50% respiratory variability, suggesting right atrial pressure of 15 mmHg. FINDINGS  Left Ventricle: Left ventricular ejection fraction, by estimation, is 20 to 25%. The left ventricle has severely decreased function. The left ventricle demonstrates global hypokinesis. Definity contrast agent was given IV to delineate the left ventricular endocardial borders. The left ventricular internal cavity size was moderately dilated. There is no left ventricular hypertrophy. The interventricular septum is flattened in systole and diastole, consistent with right ventricular pressure and volume overload. Left ventricular diastolic function could not be evaluated due to atrial fibrillation. Left ventricular diastolic function could not be evaluated. Right Ventricle: The right ventricular size is moderately  enlarged. No increase in right ventricular wall thickness. Right ventricular systolic function is severely reduced. Left Atrium: Left atrial size was moderately dilated. Right Atrium: Right atrial size was moderately dilated. Pericardium: There is no evidence of pericardial effusion. Mitral Valve: The mitral valve is normal in structure. Mild to moderate mitral valve regurgitation. Tricuspid Valve: The tricuspid valve is normal in structure. Tricuspid valve regurgitation is mild to moderate. Aortic Valve: The aortic valve is tricuspid. There is mild calcification of the aortic valve. Aortic valve regurgitation is not visualized. Pulmonic Valve: The pulmonic valve was grossly normal. Pulmonic valve regurgitation is not visualized. Aorta: Aortic dilatation noted. There is borderline dilatation of the ascending aorta, measuring 40 mm. Venous: The inferior vena cava is dilated in size with less than 50% respiratory variability, suggesting right atrial pressure of 15 mmHg. IAS/Shunts: No atrial level shunt detected by color flow Doppler.  LEFT VENTRICLE PLAX 2D LVIDd:         6.20 cm LVIDs:         5.50 cm LV PW:         1.60 cm LV IVS:        1.30 cm  LV Volumes (MOD) LV vol d, MOD A2C: 162.0 ml LV vol d, MOD A4C: 166.0 ml LV vol s, MOD A2C: 116.0 ml LV vol s, MOD A4C: 129.0 ml LV SV MOD A2C:     46.0 ml LV SV MOD A4C:     166.0 ml LV SV MOD BP:      42.7 ml RIGHT VENTRICLE            IVC RV S prime:     4.43 cm/s  IVC diam: 3.00 cm TAPSE (M-mode): 0.8 cm LEFT ATRIUM              Index       RIGHT ATRIUM  Index LA diam:        4.90 cm  2.18 cm/m  RA Area:     21.60 cm LA Vol (A2C):   103.0 ml 45.74 ml/m RA Volume:   63.70 ml  28.29 ml/m LA Vol (A4C):   101.0 ml 44.85 ml/m LA Biplane Vol: 105.0 ml 46.63 ml/m  AORTIC VALVE LVOT Vmax:   66.20 cm/s LVOT Vmean:  47.000 cm/s LVOT VTI:    0.092 m  AORTA Ao Root diam: 3.90 cm Ao Asc diam:  4.00 cm MITRAL VALVE                TRICUSPID VALVE MV Area (PHT): 5.66  cm     TR Peak grad:   19.4 mmHg MV Decel Time: 134 msec     TR Vmax:        220.00 cm/s MR Peak grad: 71.6 mmHg MR Mean grad: 38.0 mmHg     SHUNTS MR Vmax:      423.00 cm/s   Systemic VTI: 0.09 m MR Vmean:     271.0 cm/s MV E velocity: 102.00 cm/s Arvilla Meres MD Electronically signed by Arvilla Meres MD Signature Date/Time: 02/15/2021/5:10:21 PM    Final    VAS [place] LOWER EXTREMITY ARTERIAL DUPLEX  Result Date: 02/15/2021 LOWER EXTREMITY ARTERIAL DUPLEX STUDY Patient Name:  DERRICK TIEGS  Date of Exam:   02/15/2021 Medical Rec #: [id number]      Accession #:    [id number] Date of Birth: 09-Aug-1961      Patient Gender: M Patient Age:   47Y Exam Location:  [facility] Procedure:      VAS [place] LOWER EXTREMITY ARTERIAL DUPLEX Referring Phys: [id number] Nada Libman --------------------------------------------------------------------------------  Indications: Gangrene, peripheral artery disease, and Follow up CTA findings-              concern for mid SFA occlusive disease. High Risk Factors: Hypertension, hyperlipidemia, Diabetes, coronary artery                    disease, prior CVA.  Current ABI: RT 1.04/0.00 LT 1.02/0.00 Comparison Study: 02-15-2021 Most recent ABI w/ TBI study, 04-15-2018 prior ABI                   was RT 1.23 LT 1.15 Performing Technologist: Jean Rosenthal RDMS,RVT  Examination Guidelines: A complete evaluation includes B-mode imaging, spectral Doppler, color Doppler, and power Doppler as needed of all accessible portions of each vessel. Bilateral testing is considered an integral part of a complete examination. Limited examinations for reoccurring indications may be performed as noted.  +-----------+--------+-----+--------+---------+--------+ RIGHT      PSV cm/sRatioStenosisWaveform Comments +-----------+--------+-----+--------+---------+--------+ CFA Prox   87                   triphasic         +-----------+--------+-----+--------+---------+--------+ CFA Mid    70                    triphasic         +-----------+--------+-----+--------+---------+--------+ CFA Distal 68                   triphasic         +-----------+--------+-----+--------+---------+--------+ DFA        60                   biphasic          +-----------+--------+-----+--------+---------+--------+ SFA Prox  78                   triphasic         +-----------+--------+-----+--------+---------+--------+ SFA Mid    70                   triphasic         +-----------+--------+-----+--------+---------+--------+ SFA Distal 70                   triphasic         +-----------+--------+-----+--------+---------+--------+ POP Prox   71                   triphasic         +-----------+--------+-----+--------+---------+--------+ POP Mid    53                   triphasic         +-----------+--------+-----+--------+---------+--------+ POP Distal 54                   triphasic         +-----------+--------+-----+--------+---------+--------+ TP Trunk   51                   triphasic         +-----------+--------+-----+--------+---------+--------+ ATA Distal 36                   biphasic          +-----------+--------+-----+--------+---------+--------+ PTA Distal 45                   biphasic          +-----------+--------+-----+--------+---------+--------+ PERO Distal35                   biphasic          +-----------+--------+-----+--------+---------+--------+ DP         34                   biphasic          +-----------+--------+-----+--------+---------+--------+  Summary: Right: No evidence of stenosis or occlusive disease.  See table(s) above for measurements and observations. Electronically signed by Heath Lark on 02/15/2021 at 5:19:51 PM.    Final    VAS [place] LOWER EXTREMITY VENOUS (DVT)  Result Date: 02/15/2021  Lower Venous DVT Study Patient Name:  ZIAIR PENSON  Date of Exam:   02/15/2021 Medical Rec #: [id number]       Accession #:    [id number] Date of Birth: December 26, 1960      Patient Gender: M Patient Age:   59Y Exam Location:  [facility] Procedure:      VAS [place] LOWER EXTREMITY VENOUS (DVT) Referring Phys: [id number] Nada Libman --------------------------------------------------------------------------------  Indications: Erythema and edema,.  Anticoagulation: Heparin. Limitations: Poor ultrasound/tissue interface. Comparison Study: No prior studies. Performing Technologist: Jean Rosenthal RDMS,RVT  Examination Guidelines: A complete evaluation includes B-mode imaging, spectral Doppler, color Doppler, and power Doppler as needed of all accessible portions of each vessel. Bilateral testing is considered an integral part of a complete examination. Limited examinations for reoccurring indications may be performed as noted. The reflux portion of the exam is performed with the patient in reverse Trendelenburg.  +-----+---------------+---------+-----------+----------+--------------+ RIGHTCompressibilityPhasicitySpontaneityPropertiesThrombus Aging +-----+---------------+---------+-----------+----------+--------------+ CFV  Full           Yes      Yes                                 +-----+---------------+---------+-----------+----------+--------------+   +---------+---------------+---------+-----------+----------+-------------------+  LEFT     CompressibilityPhasicitySpontaneityPropertiesThrombus Aging      +---------+---------------+---------+-----------+----------+-------------------+ CFV      Full           Yes      Yes                                      +---------+---------------+---------+-----------+----------+-------------------+ SFJ      Full                                                             +---------+---------------+---------+-----------+----------+-------------------+ FV Prox  Full                                                              +---------+---------------+---------+-----------+----------+-------------------+ FV Mid   Full                                                             +---------+---------------+---------+-----------+----------+-------------------+ FV DistalFull                                                             +---------+---------------+---------+-----------+----------+-------------------+ PFV      Full                                                             +---------+---------------+---------+-----------+----------+-------------------+ POP      Full           Yes      Yes                                      +---------+---------------+---------+-----------+----------+-------------------+ PTV      Full                                                             +---------+---------------+---------+-----------+----------+-------------------+ PERO                    Yes      Yes                  Not well visualized +---------+---------------+---------+-----------+----------+-------------------+    Summary: RIGHT: - No evidence of common femoral vein obstruction.  LEFT: -  There is no evidence of deep vein thrombosis in the lower extremity. However, portions of this examination were limited- see technologist comments above.  - No cystic structure found in the popliteal fossa.  *See table(s) above for measurements and observations. Electronically signed by Heath Larkhomas Hawken on 02/15/2021 at 5:20:04 PM.    Final     Micro Results     Recent Results (from the past 240 hour(s))  SARS CORONAVIRUS 2 (TAT 6-24 HRS) Nasopharyngeal Nasopharyngeal Swab     Status: None   Collection Time: 03/08/21 11:43 AM   Specimen: Nasopharyngeal Swab  Result Value Ref Range Status   SARS Coronavirus 2 NEGATIVE NEGATIVE Final    Comment: (NOTE) SARS-CoV-2 target nucleic acids are NOT DETECTED.  The SARS-CoV-2 RNA is generally detectable in upper and lower respiratory specimens during the  acute phase of infection. Negative results do not preclude SARS-CoV-2 infection, do not rule out co-infections with other pathogens, and should not be used as the sole basis for treatment or other patient management decisions. Negative results must be combined with clinical observations, patient history, and epidemiological information. The expected result is Negative.  Fact Sheet for Patients: HairSlick.nohttps://www.fda.gov/media/138098/download  Fact Sheet for Healthcare Providers: quierodirigir.comhttps://www.fda.gov/media/138095/download  This test is not yet approved or cleared by the [place] FDA and  has been authorized for detection and/or diagnosis of SARS-CoV-2 by FDA under an Emergency Use Authorization (EUA). This EUA will remain  in effect (meaning this test can be used) for the duration of the COVID-19 declaration under Se ction 564(b)(1) of the Act, 21 U.S.C. section 360bbb-3(b)(1), unless the authorization is terminated or revoked sooner.  Performed at [facility], [address] [place], [place] [address]     Today   Subjective    Rae HalstedGary Burgio today has no headache,no chest abdominal pain,no new weakness tingling or numbness, feels much better   Objective   Blood pressure 132/80, pulse 95, temperature 98.2 F (36.8 C), temperature source Axillary, resp. rate 20, height 6\' 3"  (1.905 m), weight 107 kg, SpO2 100 %.   Intake/Output Summary (Last 24 hours) at 03/09/2021 0819 Last data filed at 03/08/2021 1651 Gross per 24 hour  Intake 360 ml  Output 320 ml  Net 40 ml    Exam  Awake Alert, No new F.N deficits,  [place].AT,PERRAL Supple Neck,No JVD, No cervical lymphadenopathy appriciated.  Symmetrical Chest wall movement, Good air movement bilaterally, CTAB RRR,No Gallops,Rubs or new Murmurs, No Parasternal Heave +ve B.Sounds, Abd Soft, Non tender, No organomegaly appriciated, No rebound -guarding or rigidity. No Cyanosis, left BKA stump site clear with wound VAC in  place.   Data Review   CBC w Diff:  Lab Results  Component Value Date   WBC 12.2 (H) 03/09/2021   HGB 9.5 (L) 03/09/2021   HCT 29.3 (L) 03/09/2021   PLT 346 03/09/2021   LYMPHOPCT 16 03/09/2021   MONOPCT 12 03/09/2021   EOSPCT 1 03/09/2021   BASOPCT 1 03/09/2021    CMP:  Lab Results  Component Value Date   NA 132 (L) 03/09/2021   K 3.9 03/09/2021   CL 98 03/09/2021   CO2 25 03/09/2021   BUN 29 (H) 03/09/2021   CREATININE 1.08 03/09/2021   PROT 6.1 (L) 03/09/2021   ALBUMIN 2.4 (L) 03/09/2021   BILITOT 1.6 (H) 03/09/2021   ALKPHOS 86 03/09/2021   AST 68 (H) 03/09/2021   ALT 50 (H) 03/09/2021  .   Total Time in preparing paper work, data evaluation  and todays exam - 35 minutes  Susa Raring M.D on 03/09/2021 at 8:19 AM  [facility]

## 2021-03-08 NOTE — Progress Notes (Signed)
SLP Cancellation Note  Patient Details Name: Fernando Maddox MRN: [id number] DOB: Mar 16, 1961   Cancelled treatment:       Reason Eval/Treat Not Completed: Other (comment) (Pt's discharge orders have been signed and pt will be discharged to SNF pending results of COVID test. Case was discussed with Dr. Burney Gauze and he advised that he would like the MBS to be deferred at this time and for SLP to follow up at [facility].)  [facility] Vear Clock, MS, CCC-SLP Acute Rehabilitation Services Office number [phone] Pager [phone]  Scheryl Marten 03/08/2021, 1:32 PM

## 2021-03-08 NOTE — Progress Notes (Signed)
ANTICOAGULATION CONSULT NOTE - Follow Up Consult  Pharmacy Consult for Heparin bridging (Xarelto on hold) Indication: atrial fibrillation  Allergies  Allergen Reactions  . Codeine Itching  . Bee Venom Other (See Comments)    "Feels like his head is on fire when he goes outside in the sun"  . Hydrocodone-Acetaminophen Other (See Comments)  . Other Other (See Comments)    "Feels like his head is on fire when he goes outside in the sun"  . Cephalexin     No mention of this allergy in notes from 07/2016 admission for osteomyelitis.  Patient was treated with Ancef and Unasyn for broader anaerobic coverage.  Patient is not aware of allergy.  . Vicodin [Hydrocodone-Acetaminophen] Itching  . Zocor [Simvastatin] Other (See Comments)    Myalgias    Patient Measurements: Height: 6\' 3"  (190.5 cm) Weight: 106.3 kg (234 lb 5.6 oz) IBW/kg (Calculated) : 84.5 Heparin Dosing Weight: 101 kg  Vital Signs: Temp: 97.8 F (36.6 C) (05/13 0500) Temp Source: Axillary (05/13 0500) BP: 112/92 (05/13 0500) Pulse Rate: 90 (05/13 0500)  Labs: Recent Labs    03/06/21 0050 03/07/21 0047 03/07/21 1859 03/08/21 0538  HGB 10.5* 10.8*  --  9.8*  HCT 32.0* 34.4*  --  30.3*  PLT 270 376  --  338  HEPARINUNFRC  --   --  1.08* 0.89*  CREATININE 1.02 1.28*  --   --     Estimated Creatinine Clearance: 81.9 mL/min (A) (by C-G formula based on SCr of 1.28 mg/dL (H)).  Assessment: 60 y.o.male with medical history of DM, HTN, strokes, and A. Fib on rate control and systemic AC who presented 02/14/21 as a transfer from [facility] ED for evaluation of limb ischemic and septic shock.Per medical record had been on Xarelto, unclear when patient had his last dose. Patient started on Heparin infusion at [facility]. Pharmacy consulted to follow heparin drip.   Pt had been off of anticoagulation since 02/28/21 due to bleeding from TMA site. 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 Heparin resumed at 1000 today. Previously on a  very high rate, and was resumed at prior rate of 3150 units/hr.   Heparin level remains supratherapeutic (0.89) on 2850 units/hr. No issues with bleeding. Sample appears to be drawn appropriately.  Goal of Therapy:  Heparin level 0.3-0.7 units/ml Monitor platelets by anticoagulation protocol: Yes   Plan:  Decrease heparin to 2600 units/hr F/u 6 hr heparin level  05/06/21, PharmD, BCPS Please see amion for complete clinical pharmacist phone list 03/08/2021,6:34 AM

## 2021-03-08 NOTE — TOC Transition Note (Signed)
Transition of Care [facility]) - CM/SW Discharge Note   Patient Details  Name: Fernando Maddox MRN: [id number] Date of Birth: 11-25-1960  Transition of Care [facility]) CM/SW Contact:  Mearl Latin, LCSW Phone Number: 03/08/2021, 11:23 AM   Clinical Narrative:    Patient will DC to: [name] Anticipated DC date: 03/08/21 Family notified: Mother, Rama Transport by: Sharin Mons once COVID results negative   Per MD patient ready for DC to [facility]. RN to call report prior to discharge [phone] Room 140). RN, patient, patient's family, and facility notified of DC. Discharge Summary and FL2 sent to facility. DC packet on chart. Ambulance transport requested for patient.   CSW will sign off for now as social work intervention is no longer needed. Please consult [place] again if new needs arise.      Final next level of care: Skilled Nursing Facility Barriers to Discharge: Barriers Resolved   Patient Goals and CMS Choice Patient states their goals for this hospitalization and ongoing recovery are:: Rehab CMS Medicare.gov Compare Post Acute Care list provided to:: Patient Choice offered to / list presented to : Patient  Discharge Placement   Existing PASRR number confirmed : 03/08/21          Patient [facility] bed at: [facility] Patient to be transferred to facility by: [facility] Name of family member notified: Mother Patient and family notified of of transfer: 03/08/21  Discharge Plan and Services In-house Referral: Clinical Social Work Discharge Planning Services: CM Consult Post Acute Care Choice: Skilled Nursing Facility                               Social Determinants of Health (SDOH) Interventions     Readmission Risk Interventions No flowsheet data found.

## 2021-03-08 NOTE — Progress Notes (Signed)
Progress Note  Patient Name: Fernando Maddox Date of Encounter: 03/08/2021  [facility] Cardiologist: Garwin Brothers, MD   Subjective   Has done will with [facility] and fluid restriction in interim.  Has been less aggressive with staff.   Notes post surgical leg and stump burning.  Inpatient Medications    Scheduled Meds: . vitamin C  1,000 mg Oral Daily  . Chlorhexidine Gluconate Cloth  6 each Topical Daily  . docusate sodium  100 mg Oral BID  . famotidine  20 mg Oral Daily  . feeding supplement (GLUCERNA SHAKE)  237 mL Oral BID BM  . insulin aspart  0-15 Units Subcutaneous TID WC  . insulin aspart  0-5 Units Subcutaneous QHS  . mouth rinse  15 mL Mouth Rinse BID  . metoprolol succinate  12.5 mg Oral Daily  . midodrine  10 mg Oral TID WC  . multivitamin with minerals  1 tablet Oral Daily  . pantoprazole  40 mg Oral Daily   Continuous Infusions: . ampicillin-sulbactam (UNASYN) IV 3 g (03/08/21 0500)   PRN Meds: acetaminophen **OR** [DISCONTINUED] acetaminophen, albuterol, ALPRAZolam, alum & mag hydroxide-simeth, bisacodyl, guaiFENesin-dextromethorphan, HYDROmorphone (DILAUDID) injection, magnesium citrate, [DISCONTINUED] ondansetron **OR** ondansetron (ZOFRAN) IV, oxyCODONE-acetaminophen, phenol, polyethylene glycol, zolpidem   Vital Signs    Vitals:   03/08/21 0340 03/08/21 0400 03/08/21 0500 03/08/21 0750  BP:  110/89 (!) 112/92 (!) 119/96  Pulse:   90 98  Resp:  17 19 (!) 25  Temp:   97.8 F (36.6 C) (!) 97.5 F (36.4 C)  TempSrc:   Axillary Axillary  SpO2:   90% 100%  Weight: 106.3 kg     Height:        Intake/Output Summary (Last 24 hours) at 03/08/2021 1029 Last data filed at 03/08/2021 0900 Gross per 24 hour  Intake 1320 ml  Output 1000 ml  Net 320 ml   Last 3 Weights 03/08/2021 03/06/2021 03/06/2021  Weight (lbs) 234 lb 5.6 oz 219 lb 9.3 oz 219 lb 9.3 oz  Weight (kg) 106.3 kg 99.6 kg 99.6 kg      Telemetry     Afib rate controlled- Personally  Reviewed  ECG    No new - Personally Reviewed  Physical Exam   GEN: No acute distress.   Neck: No JVD Cardiac: irregularly irregular , no murmurs, rubs, or gallops.  Respiratory: minimal crackles at bases GI: Soft, nontender, non-distended  MS: no LLE edema; l stump site c/d/i Neuro:  Nonfocal  Psych: Normal affect   Labs    High Sensitivity Troponin:  No results for input(s): TROPONINIHS in the last 720 hours.    Chemistry Recent Labs  Lab 03/06/21 0050 03/07/21 0047 03/08/21 0538  NA 130* 130* 132*  K 4.3 5.7* 4.3  CL 96* 96* 97*  CO2 24 17* 24  GLUCOSE 140* 174* 144*  BUN 16 22* 26*  CREATININE 1.02 1.28* 1.15  CALCIUM 8.6* 8.8* 8.6*  PROT 6.6 6.8 6.5  ALBUMIN 2.5* 2.6* 2.5*  AST 30 37 49*  ALT 33 35 42  ALKPHOS 102 94 85  BILITOT 0.9 2.1* 1.4*  GFRNONAA >60 >60 >60  ANIONGAP 10 17* 11     Hematology Recent Labs  Lab 03/06/21 0050 03/07/21 0047 03/08/21 0538  WBC 8.8 9.2 11.6*  RBC 3.72* 3.90* 3.47*  HGB 10.5* 10.8* 9.8*  HCT 32.0* 34.4* 30.3*  MCV 86.0 88.2 87.3  MCH 28.2 27.7 28.2  MCHC 32.8 31.4 32.3  RDW 20.4*  20.7* 21.0*  PLT 270 376 338    BNP Recent Labs  Lab 03/06/21 0050 03/07/21 0047 03/08/21 0538  BNP 1,629.4* 1,730.2* 1,333.1*     DDimer No results for input(s): DDIMER in the last 168 hours.   Radiology    DG Chest Port 1 View  Result Date: 03/07/2021 CLINICAL DATA:  Shortness of breath EXAM: PORTABLE CHEST 1 VIEW COMPARISON:  Mar 04, 2021 chest radiograph and chest CT February 03, 2021 FINDINGS: There is ill-defined airspace opacity in the right lower lung region. The lungs elsewhere are clear. There is cardiomegaly with pulmonary vascularity normal. There are foci of coronary artery calcification. No adenopathy. There is an old healed fracture of the left clavicle. Prior rib fractures bilaterally noted. Chronic separation right acromioclavicular joint noted. IMPRESSION: Ill-defined airspace opacity in the right lower lobe  region concerning for developing pneumonia. Lungs elsewhere clear. Stable cardiomegaly. Electronically Signed   By: Bretta Bang III M.D.   On: 03/07/2021 08:50    Cardiac Studies   Echocardiogram   1. Left ventricular ejection fraction, by estimation, is 20 to 25%. The  left ventricle has severely decreased function. The left ventricle  demonstrates global hypokinesis. The left ventricular internal cavity size  was moderately dilated. Left  ventricular diastolic function could not be evaluated. There is the  interventricular septum is flattened in systole and diastole, consistent  with right ventricular pressure and volume overload.   2. Right ventricular systolic function is severely reduced. The right  ventricular size is moderately enlarged.   3. Left atrial size was moderately dilated.   4. Right atrial size was moderately dilated.   5. The mitral valve is normal in structure. Mild to moderate mitral valve  regurgitation.   6. Tricuspid valve regurgitation is mild to moderate.   7. The aortic valve is tricuspid. There is mild calcification of the  aortic valve. Aortic valve regurgitation is not visualized.   8. Aortic dilatation noted. There is borderline dilatation of the  ascending aorta, measuring 40 mm.   9. The inferior vena cava is dilated in size with <50% respiratory  variability, suggesting right atrial pressure of 15 mmHg.     Patient Profile     60 y.o. male with pmh of CAD, permanent Afib, HFrEF, HTN, HLD, DM2, CVA, PAD with nonhealing foot wounds, OSA on CPAP, and tobacco abuse who presented to [facility] after being found down for 2 hours. He was found to have LLE gangrene with concern for right arterial occlusion/septic shock prompting transfer to [facility]. Vascular studies showed normal ABI, negative LE dopplers for DVT. He was recommended transmetatarsal amputation.  Cardiology was consulted for HFrEF and chronic Afib. Echo 4/22 showed EF 20-25%, global  hypokinesis, severely reduced RV function with mod enlargement, LA/RA moderately dilated, mild to mod tricuspid regurgitation. No LV thrombus.  Assessment & Plan    Acute on chronic combined systolic and diastolic heart failure - s/p IV lasix, last does 5/7. Not on oral lasix at this time - hypotensive and started on Midodrine 10mg  TID which precludes significant other GMDT at this time - Toprol XL 12.5 mg - will need outpatient lasix restart (significant concern patient will not continue fluid restriction at home) (BNP at DC improved from 1700-> 1300)  Permanent Afib Anemia concerning for bleed through course - CHADSVASC at least 6 - agree with transition to DOAC for today  Septic shock Gangrenous LLE - sepsis resolved - s/p amputation  Hyponatremia - NA improving, today  132  HLD - statin held for elevated LFTs (improving not resolved)  Polysubstance abuse - UDS with THC and amphetamines - patient is still pre-contemplative about stopping   Tobacco use - cessation advised   For questions or updates, please contact [facility] Please consult [email or web address] for contact info under   [facility] will sign off.   Medication Recommendations:  Metoprolol, DOAC, and outpatient lasix Other recommendations (labs, testing, etc):  Follow up BMP Follow up as an outpatient:  Will arrange outpatient follow up     Signed, Christell Constant, MD  03/08/2021, 10:29 AM

## 2021-03-09 ENCOUNTER — Emergency Department (HOSPITAL_COMMUNITY)
Admission: EM | Admit: 2021-03-09 | Discharge: 2021-03-11 | Disposition: A | Discharge status: Home / Self Care | Payer: Medicare Other | Attending: Emergency Medicine | Admitting: Emergency Medicine

## 2021-03-09 ENCOUNTER — Encounter (HOSPITAL_COMMUNITY): Payer: Self-pay

## 2021-03-09 DIAGNOSIS — Z79899 Other long term (current) drug therapy: Secondary | ICD-10-CM | POA: Insufficient documentation

## 2021-03-09 DIAGNOSIS — Z7984 Long term (current) use of oral hypoglycemic drugs: Secondary | ICD-10-CM | POA: Insufficient documentation

## 2021-03-09 DIAGNOSIS — I2510 Atherosclerotic heart disease of native coronary artery without angina pectoris: Secondary | ICD-10-CM | POA: Insufficient documentation

## 2021-03-09 DIAGNOSIS — Z20822 Contact with and (suspected) exposure to covid-19: Secondary | ICD-10-CM | POA: Diagnosis not present

## 2021-03-09 DIAGNOSIS — I5042 Chronic combined systolic (congestive) and diastolic (congestive) heart failure: Secondary | ICD-10-CM | POA: Insufficient documentation

## 2021-03-09 DIAGNOSIS — I110 Hypertensive heart disease with heart failure: Secondary | ICD-10-CM | POA: Insufficient documentation

## 2021-03-09 DIAGNOSIS — M79605 Pain in left leg: Secondary | ICD-10-CM | POA: Insufficient documentation

## 2021-03-09 DIAGNOSIS — E119 Type 2 diabetes mellitus without complications: Secondary | ICD-10-CM | POA: Diagnosis not present

## 2021-03-09 DIAGNOSIS — Z87891 Personal history of nicotine dependence: Secondary | ICD-10-CM | POA: Diagnosis not present

## 2021-03-09 DIAGNOSIS — Z7901 Long term (current) use of anticoagulants: Secondary | ICD-10-CM | POA: Insufficient documentation

## 2021-03-09 DIAGNOSIS — W06XXXA Fall from bed, initial encounter: Secondary | ICD-10-CM | POA: Insufficient documentation

## 2021-03-09 DIAGNOSIS — R4182 Altered mental status, unspecified: Secondary | ICD-10-CM | POA: Diagnosis not present

## 2021-03-09 LAB — CBC WITH DIFFERENTIAL/PLATELET
Abs Immature Granulocytes: 0.1 10*3/uL — ABNORMAL HIGH (ref 0.00–0.07)
Basophils Absolute: 0.1 10*3/uL (ref 0.0–0.1)
Basophils Relative: 1 %
Eosinophils Absolute: 0.2 10*3/uL (ref 0.0–0.5)
Eosinophils Relative: 1 %
HCT: 29.3 % — ABNORMAL LOW (ref 39.0–52.0)
Hemoglobin: 9.5 g/dL — ABNORMAL LOW (ref 13.0–17.0)
Immature Granulocytes: 1 %
Lymphocytes Relative: 16 %
Lymphs Abs: 1.9 10*3/uL (ref 0.7–4.0)
MCH: 27.9 pg (ref 26.0–34.0)
MCHC: 32.4 g/dL (ref 30.0–36.0)
MCV: 85.9 fL (ref 80.0–100.0)
Monocytes Absolute: 1.4 10*3/uL — ABNORMAL HIGH (ref 0.1–1.0)
Monocytes Relative: 12 %
Neutro Abs: 8.5 10*3/uL — ABNORMAL HIGH (ref 1.7–7.7)
Neutrophils Relative %: 69 %
Platelets: 346 10*3/uL (ref 150–400)
RBC: 3.41 MIL/uL — ABNORMAL LOW (ref 4.22–5.81)
RDW: 21 % — ABNORMAL HIGH (ref 11.5–15.5)
WBC: 12.2 10*3/uL — ABNORMAL HIGH (ref 4.0–10.5)
nRBC: 0 % (ref 0.0–0.2)

## 2021-03-09 LAB — COMPREHENSIVE METABOLIC PANEL
ALT: 50 U/L — ABNORMAL HIGH (ref 0–44)
AST: 68 U/L — ABNORMAL HIGH (ref 15–41)
Albumin: 2.4 g/dL — ABNORMAL LOW (ref 3.5–5.0)
Alkaline Phosphatase: 86 U/L (ref 38–126)
Anion gap: 9 (ref 5–15)
BUN: 29 mg/dL — ABNORMAL HIGH (ref 6–20)
CO2: 25 mmol/L (ref 22–32)
Calcium: 8.4 mg/dL — ABNORMAL LOW (ref 8.9–10.3)
Chloride: 98 mmol/L (ref 98–111)
Creatinine, Ser: 1.08 mg/dL (ref 0.61–1.24)
GFR, Estimated: >60 mL/min (ref >60)
Glucose, Bld: 132 mg/dL — ABNORMAL HIGH (ref 70–99)
Potassium: 3.9 mmol/L (ref 3.5–5.1)
Sodium: 132 mmol/L — ABNORMAL LOW (ref 135–145)
Total Bilirubin: 1.6 mg/dL — ABNORMAL HIGH (ref 0.3–1.2)
Total Protein: 6.1 g/dL — ABNORMAL LOW (ref 6.5–8.1)

## 2021-03-09 LAB — MAGNESIUM: Magnesium: 1.8 mg/dL (ref 1.7–2.4)

## 2021-03-09 LAB — GLUCOSE, CAPILLARY: Glucose-Capillary: 113 mg/dL — ABNORMAL HIGH (ref 70–99)

## 2021-03-09 LAB — PROCALCITONIN: Procalcitonin: 0.16 ng/mL

## 2021-03-09 LAB — BRAIN NATRIURETIC PEPTIDE: B Natriuretic Peptide: 1622.7 pg/mL — ABNORMAL HIGH (ref 0.0–100.0)

## 2021-03-09 MED ORDER — OXYCODONE-ACETAMINOPHEN 5-325 MG PO TABS: 1.0000 | ORAL_TABLET | Freq: Four times a day (QID) | ORAL | Status: DC | PRN

## 2021-03-09 MED ORDER — OXYCODONE HCL 5 MG PO TABS: 5.0000 mg | ORAL_TABLET | Freq: Once | ORAL | Status: DC

## 2021-03-09 MED ORDER — METOPROLOL SUCCINATE ER 25 MG PO TB24: 25.0000 mg | ORAL_TABLET | Freq: Every day | ORAL | Status: DC

## 2021-03-09 MED ORDER — ACETAMINOPHEN 325 MG PO TABS
650.0000 mg | ORAL_TABLET | Freq: Once | ORAL | Status: AC
  Administered 2021-03-09: 650 mg via ORAL
  Filled 2021-03-09: qty 2

## 2021-03-09 MED ORDER — MIDODRINE HCL 5 MG PO TABS: 5.0000 mg | ORAL_TABLET | Freq: Three times a day (TID) | ORAL | Status: DC

## 2021-03-09 MED ORDER — METOPROLOL SUCCINATE ER 25 MG PO TB24
25.0000 mg | ORAL_TABLET | Freq: Every day | ORAL | Status: DC
  Administered 2021-03-09: 25 mg via ORAL
  Filled 2021-03-09: qty 1

## 2021-03-09 NOTE — ED Notes (Addendum)
Updated pt's father per request of the pt. Pt's faher expressed concern for different placement of facility. Pt's father requests updates. [phone]

## 2021-03-09 NOTE — Progress Notes (Signed)
Patient discharged from facility at this time via [facility]. He is a&o x3. VSS. IV sites removed, catheter intact, pressure dressings applied. All patient belongings in disposition of the transport team. No further needs at this time.

## 2021-03-09 NOTE — ED Notes (Signed)
Pt placed on 2L [place] due to O2 sats dropping to 88% while sleeping.

## 2021-03-09 NOTE — TOC Transition Note (Signed)
Transition of Care [facility]) - CM/SW Discharge Note   Patient Details  Name: Fernando Maddox MRN: [id number] Date of Birth: 1961/08/26  Transition of Care [facility]) CM/SW Contact:  Levada Schilling Phone Number: 03/09/2021, 9:47 AM   Clinical Narrative:    Patient will Discharge To: [facility] Anticipated DC Date:03/09/21 Family Notified:yes, Father, Takoda Janowiak, [phone] Transport JJ:[id number]   Per MD patient ready for DC to [facility] . RN, patient, patient's family, and facility notified of DC. Assessment, Fl2/Pasrr, and Discharge Summary sent to facility. RN given number for report (# to report [phone], Rm 140). DC packet on chart. Ambulance transport requested for patient.   CSW signing off.  Budd Palmer [facility] [phone]     Final next level of care: Skilled Nursing Facility Barriers to Discharge: No Barriers Identified   Patient Goals and CMS Choice Patient states their goals for this hospitalization and ongoing recovery are:: Rehab CMS Medicare.gov Compare Post Acute Care list provided to:: Patient Choice offered to / list presented to : Patient  Discharge Placement   Existing PASRR number confirmed : 03/08/21          Patient chooses bed at:  ([facility]) Patient to be transferred to facility by: [facility] Name of family member notified: Elie Goody, Father Patient and family notified of of transfer: 03/09/21  Discharge Plan and Services In-house Referral: Clinical Social Work Discharge Planning Services: CM Consult Post Acute Care Choice: Skilled Nursing Facility                               Social Determinants of Health (SDOH) Interventions     Readmission Risk Interventions No flowsheet data found.

## 2021-03-09 NOTE — ED Triage Notes (Addendum)
Sent from facility state cannot care for pt anymore. State pt "threw himself on floor." Pt states attempted to get up for water when staff wouldn't answer his calls and fell. Recently in hospital of new BKA. A/0 x4 and talking. Pt states was only given tylenol for L leg pain. Has rx for pain medications that was never filled @ facility.  EMS vitals BP 126/86 94% RA HR 101 CBG 228

## 2021-03-09 NOTE — Progress Notes (Signed)
[facility]                                                                                                                                                                         Patient Demographics  Fernando Maddox, is a 60 y.o. male  LAG:[id number]  ZYY:[id number]  DOB - 03-Jun-1961  Admit date - 02/14/2021  Admitting Physician Charlott Holler, MD  Outpatient Primary MD for the patient is Crist Fat, MD  LOS - 23   No chief complaint on file.       Assessment & Plan    Patient seen briefly today due for discharge soon per Discharge done 03/08/21 by me, no further issues, Vital signs stable, patient feels fine.  Awaits transport to SNF.    Medications  Scheduled Meds: . vitamin C  1,000 mg Oral Daily  . Chlorhexidine Gluconate Cloth  6 each Topical Daily  . docusate sodium  100 mg Oral BID  . famotidine  20 mg Oral Daily  . feeding supplement (GLUCERNA SHAKE)  237 mL Oral BID BM  . insulin aspart  0-15 Units Subcutaneous TID WC  . insulin aspart  0-5 Units Subcutaneous QHS  . mouth rinse  15 mL Mouth Rinse BID  . metoprolol succinate  12.5 mg Oral Daily  . midodrine  10 mg Oral TID WC  . multivitamin with minerals  1 tablet Oral Daily  . pantoprazole  40 mg Oral Daily  . rivaroxaban  20 mg Oral Q supper   Continuous Infusions: . ampicillin-sulbactam (UNASYN) IV 3 g (03/09/21 0557)   PRN Meds:.acetaminophen **OR** [DISCONTINUED] acetaminophen, albuterol, ALPRAZolam, alum & mag hydroxide-simeth, bisacodyl, guaiFENesin-dextromethorphan, HYDROmorphone (DILAUDID) injection, magnesium citrate, [DISCONTINUED] ondansetron **OR** ondansetron (ZOFRAN) IV, oxyCODONE-acetaminophen, phenol, polyethylene glycol, zolpidem    Time Spent in minutes   10 minutes   Susa Raring M.D on 03/09/2021 at 8:16 AM  Between 7am to 7pm - Pager - [phone]  After 7pm go to [email or web address] - password  [email or web address]  And look for the night coverage person covering for me after hours  [facility] Office  [phone]    Subjective:   Fernando Maddox today has, No headache, No chest pain, No abdominal pain - No Nausea, No new weakness tingling or numbness, No Cough - SOB. Some L stump pain  Objective:   Vitals:   03/08/21 1646 03/08/21 2057 03/09/21 0418 03/09/21 0725  BP: 118/84 98/80 113/87 132/80  Pulse: 98 79 87 95  Resp: 20 17 14 20   Temp: (!) 97.5 F (36.4 C) 97.9 F (36.6 C) 98.4 F (36.9 C) 98.2 F (36.8 C)  TempSrc: Axillary  Axillary Axillary  SpO2: 100% 100% 100% 100%  Weight:   107 kg  Height:        Wt Readings from Last 3 Encounters:  03/09/21 107 kg  08/24/20 96.4 kg  08/01/20 100.2 kg     Intake/Output Summary (Last 24 hours) at 03/09/2021 0816 Last data filed at 03/08/2021 1651 Gross per 24 hour  Intake 360 ml  Output 320 ml  Net 40 ml    Exam Awake Alert, Oriented X 3, No new F.N deficits, Normal affect Branch.AT,PERRAL Supple Neck,No JVD, No cervical lymphadenopathy appriciated.  Symmetrical Chest wall movement, Good air movement bilaterally, CTAB RRR,No Gallops,Rubs or new Murmurs, No Parasternal Heave +ve B.Sounds, Abd Soft, Non tender, No organomegaly appriciated, No rebound - guarding or rigidity. No Cyanosis, L. BKA stump under shrink bandage   Data Review

## 2021-03-09 NOTE — ED Provider Notes (Incomplete)
Fernando Maddox   CSN: [id number] Arrival date & time: 03/09/21  2057     History No chief complaint on file.   Fernando Maddox is a 60 y.o. male presenting from [name] via EMS for SNF placement.  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  Was discharged to SNF this today.  HPI     Past Medical History:  Diagnosis Date  . Acute combined systolic and diastolic CHF, NYHA class 1 (HCC) 08/07/2016  . Anemia   . Atrial fibrillation (HCC)   . BACK PAIN 02/11/2010   Qualifier: Diagnosis of  By: Manson Passey, RN, BSN, Lauren    . CAD (coronary artery disease) 02/11/2010   Qualifier: Diagnosis of  By: Manson Passey, RN, BSN, Lauren    . Cellulitis 09/29/2017  . Cerebrovascular disease   . CHEST PAIN 02/11/2010   Qualifier: Diagnosis of  By: Manson Passey, RN, BSN, Lauren    . Chronic anticoagulation 03/25/2018  . Chronic pain syndrome   . Chronic systolic congestive heart failure (HCC) 03/25/2018  . Community acquired pneumonia 08/11/2016  . DEPRESSION/ANXIETY 02/11/2010   Qualifier: Diagnosis of  By: Manson Passey, RN, BSN, Lauren    . Diabetes mellitus, type 2 (HCC) 02/11/2010   Qualifier: Diagnosis of  By: Manson Passey, RN, BSN, Lauren    . Diabetic ulcer of toe of right foot associated with type 2 diabetes mellitus (HCC) 10/01/2017  . ESOPHAGEAL STRICTURE 02/11/2010   Qualifier: Diagnosis of  By: Manson Passey, RN, BSN, Lauren    . GERD (gastroesophageal reflux disease)   . HEADACHE 02/11/2010   Qualifier: Diagnosis of  By: Manson Passey, RN, BSN, Lauren    . History of CVA (cerebrovascular accident) 09/29/2017  . History of drug abuse (HCC) 08/05/2016  . Hypercholesteremia   . Hyperlipemia 02/11/2010   Qualifier: Diagnosis of  By: Manson Passey, RN, BSN, Lauren    . Hyperlipidemia   . Hypertension 02/11/2010   Qualifier: Diagnosis of  By: Manson Passey, RN, BSN, Lauren    . HYPERTENSION, UNSPECIFIED 02/11/2010    Qualifier: Diagnosis of  By: Manson Passey, RN, BSN, Lauren    . Hypogonadism male   . 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 Status post thrombectomy  . Osteoarthritis 08/05/2016  . Osteomyelitis, unspecified (HCC) 10/06/2017  . PAF (paroxysmal atrial fibrillation) (HCC) 08/05/2016  . Polysubstance abuse (HCC) 09/29/2017  . Rotator cuff syndrome    Shoulder  . Septic shock (HCC) 02/14/2021  . Shoulder pain   . 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 Sleep apnea   . Stroke (HCC) 08/05/2016  . Tobacco use 08/11/2016  . Ulcer of left foot with necrosis of bone [facility])     Patient Active Problem List   Diagnosis Date Noted  . Dehiscence of amputation stump (HCC)   . Foot ulcer (HCC)   . Acute combined systolic and diastolic congestive heart failure (HCC)   . Hyponatremia   . Ulcer of left foot with necrosis of bone (HCC)   . Septic shock (HCC) 02/14/2021  . Atrial fibrillation (HCC)   . Cerebrovascular disease   . Chronic pain syndrome   . Hyperlipidemia   . Shoulder pain   . Anemia   . GERD (gastroesophageal reflux disease)   . Hypercholesteremia   . Hypogonadism male   . Rotator cuff syndrome   .  Sleep apnea   . Chronic anticoagulation 03/25/2018  . Chronic systolic congestive heart failure (HCC) 03/25/2018  . Osteomyelitis, unspecified (HCC) 10/06/2017  . Diabetic ulcer of toe of right foot associated with type 2 diabetes mellitus (HCC) 10/01/2017  . Cellulitis 09/29/2017  . History of CVA (cerebrovascular accident) 09/29/2017  . Polysubstance abuse (HCC) 09/29/2017  . 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 Community acquired pneumonia 08/11/2016  . Tobacco use 08/11/2016  . Acute combined systolic and diastolic CHF, NYHA class 1 (HCC) 08/07/2016  . 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 History of drug abuse (HCC)  08/05/2016  . Osteoarthritis 08/05/2016  . PAF (paroxysmal atrial fibrillation) (HCC) 08/05/2016  . Stroke (HCC) 08/05/2016  . Diabetes mellitus, type 2 (HCC) 02/11/2010  . Hyperlipemia 02/11/2010  . HYPERTENSION, UNSPECIFIED 02/11/2010  . CAD (coronary artery disease) 02/11/2010  . Hypertension 02/11/2010  . ESOPHAGEAL STRICTURE 02/11/2010  . BACK PAIN 02/11/2010  . HEADACHE 02/11/2010  . CHEST PAIN 02/11/2010  . DEPRESSION/ANXIETY 02/11/2010    Past Surgical History:  Procedure Laterality Date  . AMPUTATION Left 02/20/2021   Procedure: LEFT TRANSMETATARSAL AMPUTATION;  Surgeon: [name], MD;  Location: [facility] OR;  Service: Orthopedics;  Laterality: Left;  . AMPUTATION Left 03/06/2021   Procedure: LEFT BELOW KNEE AMPUTATION;  Surgeon: [name], MD;  Location: [facility] OR;  Service: Orthopedics;  Laterality: Left;  . NO PAST SURGERIES         Family History  Problem Relation Age of Onset  . Hypertension Mother   . Hypertension Father     Social History   Tobacco Use  . Smoking status: Former Smoker    Types: Cigarettes    Start date: 11/06/2016  . Smokeless tobacco: Former Neurosurgeon    Quit date: 03/06/2016    Home Medications Prior to Admission medications   Medication Sig Start Date End Date Taking? Authorizing Provider  acetaminophen (TYLENOL) 325 MG tablet Take 2 tablets (650 mg total) by mouth every 6 (six) hours as needed for mild pain (or Fever >/= 101). 03/08/21   Leroy Sea, MD  albuterol (PROVENTIL) (2.5 MG/3ML) 0.083% nebulizer solution Inhale 3 mLs into the lungs every 4 (four) hours as needed for wheezing or shortness of breath. 03/08/21   Leroy Sea, MD  amoxicillin-clavulanate (AUGMENTIN) 875-125 MG tablet Take 1 tablet by mouth 2 (two) times daily for 5 days. 03/08/21 03/13/21  Leroy Sea, MD  folic acid (FOLVITE) 1 MG tablet Take 1 mg by mouth daily.    [provider]  metFORMIN (GLUCOPHAGE) 500 MG tablet Take 500 mg by mouth 2  (two) times daily. 02/04/21   [provider]  metoprolol succinate (TOPROL-XL) 25 MG 24 hr tablet Take 1 tablet (25 mg total) by mouth daily. 03/09/21   Leroy Sea, MD  midodrine (PROAMATINE) 5 MG tablet Take 1 tablet (5 mg total) by mouth 3 (three) times daily with meals. 03/09/21   Leroy Sea, MD  oxyCODONE-acetaminophen (PERCOCET/ROXICET) 5-325 MG tablet Take 1 tablet by mouth every 8 (eight) hours as needed for severe pain. 03/08/21   Leroy Sea, MD  pantoprazole (PROTONIX) 40 MG tablet Take 1 tablet (40 mg total) by mouth daily. 03/08/21   Leroy Sea, MD  rivaroxaban (XARELTO) 20 MG TABS tablet Take 20 mg by mouth daily with supper.    [provider]    Allergies    Codeine, Bee venom, Hydrocodone-acetaminophen, Other, Cephalexin,  Vicodin [hydrocodone-acetaminophen], and Zocor [simvastatin]  Review of Systems   Review of Systems  Physical Exam Updated Vital Signs BP 94/80   Pulse (!) 101   Temp 98.1 F (36.7 C) (Oral)   Resp 15   Ht 6\' 3"  (1.905 m)   Wt 107 kg   SpO2 99%   BMI 29.48 kg/m   Physical Exam  ED Results / Procedures / Treatments   Labs (all labs ordered are listed, but only abnormal results are displayed) Labs Reviewed - No data to display  EKG None  Radiology No results found.  Procedures Procedures {Remember to document critical care time when appropriate:1}  Medications Ordered in ED Medications  oxyCODONE (Oxy IR/ROXICODONE) immediate release tablet 5 mg (has no administration in time range)  oxyCODONE-acetaminophen (PERCOCET/ROXICET) 5-325 MG per tablet 1 tablet (has no administration in time range)  acetaminophen (TYLENOL) tablet 650 mg (650 mg Oral Given 03/09/21 2322)    ED Course  I have reviewed the triage vital signs and the nursing notes.  Pertinent labs & imaging results that were available during my care of the patient were reviewed by me and considered in my medical decision making (see  chart for details).  Clinical Course as of 03/09/21 2324  Sat Mar 09, 2021  2241 2242 with [facility] [JR]    Clinical Course User Index [JR] Wayburn Shaler, Mearl Latin N, PA-C   MDM Rules/Calculators/A&P                          ***   Final Clinical Impression(s) / ED Diagnoses Final diagnoses:  None    Rx / DC Orders ED Discharge Orders    None

## 2021-03-09 NOTE — ED Provider Notes (Signed)
[facility] EMERGENCY DEPARTMENT Provider Note   CSN: [id number] Arrival date & time: 03/09/21  2057     History No chief complaint on file.   Fernando Maddox is a 60 y.o. male presenting from [name] via EMS for SNF placement.  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  Was discharged to SNF this today. Patient had complicated hospital course, initially admitted 02/14/21 for left foot ulcer, underwent transmetatarsal amputation. At some point, per patient's father, he had gotten up out of bed looking for something to drink and "tore open his foot." Eventually was found to have ischemic limb. He underwent left BKA 03/06/21. He was noted to have encephalopathy during admission. Also noted to have odd behavior during multiple PT evaluations.   He states he was trying to get a bed today to look for something to drink and eat.  When he stepped down onto his right foot he fell.  Denies any injury from the fall.  Reports his usual pain in his left leg.  He is mainly requesting something to drink on evaluation.  Discussed with [place] [name].  She states she was not on shift when this occurred though it is reported to her that patient continue to try to get out of bed at times when he fell.  Patient's father was requesting that he was put in a bed with rails to prevent him from getting out of bed and falling, however the facility is not able to do that.  It is reported that his father called EMS because he no longer wanted patient to remain at that facility.  Patient's father confirms this over the phone.  He requested the facility put him into bed with rails or put his bed against the wall, also suggested that they put chairs up against his bed so that he cannot get out of the bed, however was told this was illegal.  States he called EMS because he does not want him to stay at that facility.   Also wanted to be sure he was okay after the fall.  The history is provided by the patient, medical records and a parent.       Past Medical History:  Diagnosis Date  . Acute combined systolic and diastolic CHF, NYHA class 1 (HCC) 08/07/2016  . Anemia   . Atrial fibrillation (HCC)   . BACK PAIN 02/11/2010   Qualifier: Diagnosis of  By: Manson Passey, RN, BSN, Lauren    . CAD (coronary artery disease) 02/11/2010   Qualifier: Diagnosis of  By: Manson Passey, RN, BSN, Lauren    . Cellulitis 09/29/2017  . Cerebrovascular disease   . CHEST PAIN 02/11/2010   Qualifier: Diagnosis of  By: Manson Passey, RN, BSN, Lauren    . Chronic anticoagulation 03/25/2018  . Chronic pain syndrome   . Chronic systolic congestive heart failure (HCC) 03/25/2018  . Community acquired pneumonia 08/11/2016  . DEPRESSION/ANXIETY 02/11/2010   Qualifier: Diagnosis of  By: Manson Passey, RN, BSN, Lauren    . Diabetes mellitus, type 2 (HCC) 02/11/2010   Qualifier: Diagnosis of  By: Manson Passey, RN, BSN, Lauren    . Diabetic ulcer of toe of right foot associated with type 2 diabetes mellitus (HCC) 10/01/2017  . ESOPHAGEAL STRICTURE 02/11/2010   Qualifier: Diagnosis of  By: Manson Passey, RN, BSN, Lauren    . GERD (gastroesophageal reflux disease)   . HEADACHE 02/11/2010   Qualifier: Diagnosis of  ByManson Passey, RN, BSN, Lauren    . History of CVA (cerebrovascular accident) 09/29/2017  . History of drug abuse (HCC) 08/05/2016  . Hypercholesteremia   . Hyperlipemia 02/11/2010   Qualifier: Diagnosis of  By: Manson Passey, RN, BSN, Lauren    . Hyperlipidemia   . Hypertension 02/11/2010   Qualifier: Diagnosis of  By: Manson Passey, RN, BSN, Lauren    . HYPERTENSION, UNSPECIFIED 02/11/2010   Qualifier: Diagnosis of  By: Manson Passey, RN, BSN, Lauren    . Hypogonadism male   . 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 Status post thrombectomy  . Osteoarthritis 08/05/2016  . Osteomyelitis, unspecified (HCC) 10/06/2017   . PAF (paroxysmal atrial fibrillation) (HCC) 08/05/2016  . Polysubstance abuse (HCC) 09/29/2017  . Rotator cuff syndrome    Shoulder  . Septic shock (HCC) 02/14/2021  . Shoulder pain   . 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 Sleep apnea   . Stroke (HCC) 08/05/2016  . Tobacco use 08/11/2016  . Ulcer of left foot with necrosis of bone [facility])     Patient Active Problem List   Diagnosis Date Noted  . Dehiscence of amputation stump (HCC)   . Foot ulcer (HCC)   . Acute combined systolic and diastolic congestive heart failure (HCC)   . Hyponatremia   . Ulcer of left foot with necrosis of bone (HCC)   . Septic shock (HCC) 02/14/2021  . Atrial fibrillation (HCC)   . Cerebrovascular disease   . Chronic pain syndrome   . Hyperlipidemia   . Shoulder pain   . Anemia   . GERD (gastroesophageal reflux disease)   . Hypercholesteremia   . Hypogonadism male   . Rotator cuff syndrome   . Sleep apnea   . Chronic anticoagulation 03/25/2018  . Chronic systolic congestive heart failure (HCC) 03/25/2018  . Osteomyelitis, unspecified (HCC) 10/06/2017  . Diabetic ulcer of toe of right foot associated with type 2 diabetes mellitus (HCC) 10/01/2017  . Cellulitis 09/29/2017  . History of CVA (cerebrovascular accident) 09/29/2017  . Polysubstance abuse (HCC) 09/29/2017  . 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 Community acquired pneumonia 08/11/2016  . Tobacco use 08/11/2016  . Acute combined systolic and diastolic CHF, NYHA class 1 (HCC) 08/07/2016  . 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 History of drug abuse (HCC) 08/05/2016  . Osteoarthritis 08/05/2016  . PAF (paroxysmal atrial fibrillation) (HCC) 08/05/2016  . Stroke (HCC) 08/05/2016  . Diabetes mellitus, type 2 (HCC) 02/11/2010  . Hyperlipemia 02/11/2010  . HYPERTENSION, UNSPECIFIED 02/11/2010  . CAD (coronary artery disease) 02/11/2010  . Hypertension  02/11/2010  . ESOPHAGEAL STRICTURE 02/11/2010  . BACK PAIN 02/11/2010  . HEADACHE 02/11/2010  . CHEST PAIN 02/11/2010  . DEPRESSION/ANXIETY 02/11/2010    Past Surgical History:  Procedure Laterality Date  . AMPUTATION Left 02/20/2021   Procedure: LEFT TRANSMETATARSAL AMPUTATION;  Surgeon: [name], MD;  Location: [facility] OR;  Service: Orthopedics;  Laterality: Left;  . AMPUTATION Left 03/06/2021   Procedure: LEFT BELOW KNEE AMPUTATION;  Surgeon: [name], MD;  Location: [facility] OR;  Service: Orthopedics;  Laterality: Left;  . NO PAST SURGERIES         Family History  Problem Relation Age of Onset  . Hypertension Mother   . Hypertension Father     Social History   Tobacco Use  . Smoking status: Former Smoker  Types: Cigarettes    Start date: 11/06/2016  . Smokeless tobacco: Former Neurosurgeon    Quit date: 03/06/2016    Home Medications Prior to Admission medications   Medication Sig Start Date End Date Taking? Authorizing Provider  acetaminophen (TYLENOL) 325 MG tablet Take 2 tablets (650 mg total) by mouth every 6 (six) hours as needed for mild pain (or Fever >/= 101). 03/08/21   Leroy Sea, MD  albuterol (PROVENTIL) (2.5 MG/3ML) 0.083% nebulizer solution Inhale 3 mLs into the lungs every 4 (four) hours as needed for wheezing or shortness of breath. 03/08/21   Leroy Sea, MD  amoxicillin-clavulanate (AUGMENTIN) 875-125 MG tablet Take 1 tablet by mouth 2 (two) times daily for 5 days. 03/08/21 03/13/21  Leroy Sea, MD  folic acid (FOLVITE) 1 MG tablet Take 1 mg by mouth daily.    [provider]  metFORMIN (GLUCOPHAGE) 500 MG tablet Take 500 mg by mouth 2 (two) times daily. 02/04/21   [provider]  metoprolol succinate (TOPROL-XL) 25 MG 24 hr tablet Take 1 tablet (25 mg total) by mouth daily. 03/09/21   Leroy Sea, MD  midodrine (PROAMATINE) 5 MG tablet Take 1 tablet (5 mg total) by mouth 3 (three) times daily with meals. 03/09/21   Leroy Sea, MD  oxyCODONE-acetaminophen (PERCOCET/ROXICET) 5-325 MG tablet Take 1 tablet by mouth every 8 (eight) hours as needed for severe pain. 03/08/21   Leroy Sea, MD  pantoprazole (PROTONIX) 40 MG tablet Take 1 tablet (40 mg total) by mouth daily. 03/08/21   Leroy Sea, MD  rivaroxaban (XARELTO) 20 MG TABS tablet Take 20 mg by mouth daily with supper.    [provider]    Allergies    Codeine, Bee venom, Hydrocodone-acetaminophen, Other, Cephalexin, Vicodin [hydrocodone-acetaminophen], and Zocor [simvastatin]  Review of Systems   Review of Systems  All other systems reviewed and are negative.   Physical Exam Updated Vital Signs BP 94/80   Pulse (!) 101   Temp 98.1 F (36.7 C) (Oral)   Resp 15   Ht 6\' 3"  (1.905 m)   Wt 107 kg   SpO2 99%   BMI 29.48 kg/m   Physical Exam Vitals and nursing note reviewed.  Constitutional:      General: He is not in acute distress.    Appearance: He is well-developed.  HENT:     Head: Normocephalic and atraumatic.  Eyes:     Conjunctiva/sclera: Conjunctivae normal.  Cardiovascular:     Rate and Rhythm: Normal rate and regular rhythm.  Pulmonary:     Effort: Pulmonary effort is normal. No respiratory distress.  Abdominal:     Palpations: Abdomen is soft.  Musculoskeletal:     Comments: Left BKA, drain in place.  Skin:    General: Skin is warm.  Neurological:     Mental Status: He is alert.     Comments: Patient is oriented to person, place, year, day of the week. He is intermittently moaning in pain when he is alone in exam room, though upon entering he is answering questions and carry out conversation.  Making eye contact, frequently requesting a beverage.  Psychiatric:        Behavior: Behavior normal.     ED Results / Procedures / Treatments   Labs (all labs ordered are listed, but only abnormal results are displayed) Labs Reviewed - No data to display  EKG None  Radiology No results  found.  Procedures Procedures  Medications Ordered in ED Medications  oxyCODONE (Oxy IR/ROXICODONE) immediate release tablet 5 mg (has no administration in time range)  oxyCODONE-acetaminophen (PERCOCET/ROXICET) 5-325 MG per tablet 1 tablet (has no administration in time range)  acetaminophen (TYLENOL) tablet 650 mg (650 mg Oral Given 03/09/21 2322)    ED Course  I have reviewed the triage vital signs and the nursing notes.  Pertinent labs & imaging results that were available during my care of the patient were reviewed by me and considered in my medical decision making (see chart for details).  Clinical Course as of 03/09/21 2324  Sat Mar 09, 2021  2241 Mearl Latin with [facility] [JR]    Clinical Course User Index [JR] Tashena Ibach, [place] N, PA-C   MDM Rules/Calculators/A&P                          Patient is a 60 year old male brought to the ED via EMS from [name] for SNF placement.  Family reportedly called EMS for fall out of bed and mostly for request for new SNF placement.  Father requests he be contacted for his suggestions/requests on new SNF placement.    Did quite a bit of chart review to ascertain patient's baseline mental status, as family over the phone was not able to provide definite answer as to baseline mental status.  He is overall alert and oriented here.  He does have some odd behavior though per review of medical record and recent admission this seems to be similar to prior documented evaluations.  He is intermittently moaning in pain due to his left BKA.  It is reported he was not given prescribed oxycodone throughout the day today at the facility.  He is given a dose of pain medication here.  Screening labs, as patient will be boarding overnight for transitions of care consultation and placement.  Discussed with attending physician Dr Kirtland Bouchard. [place] and, who guided work-up and care plan at this time.  Final Clinical Impression(s) / ED Diagnoses Final  diagnoses:  None    Rx / DC Orders ED Discharge Orders    None       Keaton Stirewalt, [place] N, PA-C 03/10/21 0052    Rozelle Logan, DO 03/10/21 1732

## 2021-03-10 DIAGNOSIS — R4182 Altered mental status, unspecified: Secondary | ICD-10-CM | POA: Diagnosis not present

## 2021-03-10 LAB — CBC WITH DIFFERENTIAL/PLATELET
Abs Immature Granulocytes: 0.11 10*3/uL — ABNORMAL HIGH (ref 0.00–0.07)
Basophils Absolute: 0.1 10*3/uL (ref 0.0–0.1)
Basophils Relative: 1 %
Eosinophils Absolute: 0.2 10*3/uL (ref 0.0–0.5)
Eosinophils Relative: 1 %
HCT: 32.4 % — ABNORMAL LOW (ref 39.0–52.0)
Hemoglobin: 9.9 g/dL — ABNORMAL LOW (ref 13.0–17.0)
Immature Granulocytes: 1 %
Lymphocytes Relative: 16 %
Lymphs Abs: 2 10*3/uL (ref 0.7–4.0)
MCH: 27.7 pg (ref 26.0–34.0)
MCHC: 30.6 g/dL (ref 30.0–36.0)
MCV: 90.8 fL (ref 80.0–100.0)
Monocytes Absolute: 1.4 10*3/uL — ABNORMAL HIGH (ref 0.1–1.0)
Monocytes Relative: 11 %
Neutro Abs: 8.5 10*3/uL — ABNORMAL HIGH (ref 1.7–7.7)
Neutrophils Relative %: 70 %
Platelets: 384 10*3/uL (ref 150–400)
RBC: 3.57 MIL/uL — ABNORMAL LOW (ref 4.22–5.81)
RDW: 21.4 % — ABNORMAL HIGH (ref 11.5–15.5)
WBC: 11.8 10*3/uL — ABNORMAL HIGH (ref 4.0–10.5)
nRBC: 0 % (ref 0.0–0.2)

## 2021-03-10 LAB — URINALYSIS, ROUTINE W REFLEX MICROSCOPIC
Bilirubin Urine: NEGATIVE
Glucose, UA: NEGATIVE mg/dL
Hgb urine dipstick: NEGATIVE
Ketones, ur: NEGATIVE mg/dL
Nitrite: NEGATIVE
Protein, ur: NEGATIVE mg/dL
Specific Gravity, Urine: 1.021 (ref 1.005–1.030)
pH: 5 (ref 5.0–8.0)

## 2021-03-10 LAB — RAPID URINE DRUG SCREEN, HOSP PERFORMED
Amphetamines: NOT DETECTED
Barbiturates: NOT DETECTED
Benzodiazepines: POSITIVE — AB
Cocaine: NOT DETECTED
Opiates: NOT DETECTED
Tetrahydrocannabinol: POSITIVE — AB

## 2021-03-10 LAB — BASIC METABOLIC PANEL
Anion gap: 13 (ref 5–15)
BUN: 31 mg/dL — ABNORMAL HIGH (ref 6–20)
CO2: 21 mmol/L — ABNORMAL LOW (ref 22–32)
Calcium: 8.4 mg/dL — ABNORMAL LOW (ref 8.9–10.3)
Chloride: 98 mmol/L (ref 98–111)
Creatinine, Ser: 1.04 mg/dL (ref 0.61–1.24)
GFR, Estimated: >60 mL/min (ref >60)
Glucose, Bld: 138 mg/dL — ABNORMAL HIGH (ref 70–99)
Potassium: 4.7 mmol/L (ref 3.5–5.1)
Sodium: 132 mmol/L — ABNORMAL LOW (ref 135–145)

## 2021-03-10 LAB — CBG MONITORING, ED
Glucose-Capillary: 142 mg/dL — ABNORMAL HIGH (ref 70–99)
Glucose-Capillary: 141 mg/dL — ABNORMAL HIGH (ref 70–99)

## 2021-03-10 MED ORDER — ALPRAZOLAM 0.25 MG PO TABS
0.5000 mg | ORAL_TABLET | Freq: Once | ORAL | Status: AC
  Administered 2021-03-10: 0.5 mg via ORAL
  Filled 2021-03-10: qty 2

## 2021-03-10 MED ORDER — OXYCODONE HCL 5 MG PO TABS
5.0000 mg | ORAL_TABLET | Freq: Once | ORAL | Status: AC
  Administered 2021-03-10: 5 mg via ORAL
  Filled 2021-03-10: qty 1

## 2021-03-10 MED ORDER — OXYCODONE-ACETAMINOPHEN 5-325 MG PO TABS
1.0000 | ORAL_TABLET | Freq: Four times a day (QID) | ORAL | Status: DC | PRN
  Administered 2021-03-10 – 2021-03-11 (×3): 1 via ORAL
  Filled 2021-03-10 (×3): qty 1

## 2021-03-10 NOTE — ED Notes (Signed)
Pt's mother was at bedside for period of time, assisted pt with meal tray. Pt much more calm and cooperative with family member at bedside. Pt's mother no longer at bedside and pt is beginning to get agitated and moaning loudly again. Attempts to relieve anxiety and calm pt minimally successful.

## 2021-03-10 NOTE — ED Notes (Signed)
Pt requiring constant redirection to breathe slowly, in through nose and out through mouth, and slow down breathing. Pt becomes very agitated and hyperventilates requiring redirection every few minutes.

## 2021-03-10 NOTE — ED Notes (Addendum)
CBG 141, data not transferring.

## 2021-03-10 NOTE — NC FL2 (Signed)
[place] MEDICAID FL2 LEVEL OF CARE SCREENING TOOL     IDENTIFICATION  Patient Name: Fernando Maddox Birthdate: 10-02-1961 Sex: male Admission Date (Current Location): 03/09/2021  [facility] and [place] Number:  Producer, television/film/video and Address:  [place]. [facility], [address] [place], [place] [address]      Provider Number: [id number]  Attending Physician Name and Address:  Default, Provider, MD  Relative Name and Phone Number:  [place], Heal Rama Mother [phone]    Current Level of Care: Other (Comment) (Emergency department) Recommended Level of Care: Skilled Nursing Facility Prior Approval Number: [id number]  Date Approved/Denied:   PASRR Number:    Discharge Plan: SNF    Current Diagnoses: Patient Active Problem List   Diagnosis Date Noted  . Dehiscence of amputation stump (HCC)   . Foot ulcer (HCC)   . Acute combined systolic and diastolic congestive heart failure (HCC)   . Hyponatremia   . Ulcer of left foot with necrosis of bone (HCC)   . Septic shock (HCC) 02/14/2021  . Atrial fibrillation (HCC)   . Cerebrovascular disease   . Chronic pain syndrome   . Hyperlipidemia   . Shoulder pain   . Anemia   . GERD (gastroesophageal reflux disease)   . Hypercholesteremia   . Hypogonadism male   . Rotator cuff syndrome   . Sleep apnea   . Chronic anticoagulation 03/25/2018  . Chronic systolic congestive heart failure (HCC) 03/25/2018  . Osteomyelitis, unspecified (HCC) 10/06/2017  . Diabetic ulcer of toe of right foot associated with type 2 diabetes mellitus (HCC) 10/01/2017  . Cellulitis 09/29/2017  . History of CVA (cerebrovascular accident) 09/29/2017  . Polysubstance abuse (HCC) 09/29/2017  . 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 Community acquired pneumonia 08/11/2016  . Tobacco use 08/11/2016  . Acute combined systolic and diastolic CHF, NYHA class 1 (HCC) 08/07/2016  . SIADH (syndrome of inappropriate ADH  production) (HCC) 08/07/2016  . History of drug abuse (HCC) 08/05/2016  . Osteoarthritis 08/05/2016  . PAF (paroxysmal atrial fibrillation) (HCC) 08/05/2016  . Stroke (HCC) 08/05/2016  . Diabetes mellitus, type 2 (HCC) 02/11/2010  . Hyperlipemia 02/11/2010  . HYPERTENSION, UNSPECIFIED 02/11/2010  . CAD (coronary artery disease) 02/11/2010  . Hypertension 02/11/2010  . ESOPHAGEAL STRICTURE 02/11/2010  . BACK PAIN 02/11/2010  . HEADACHE 02/11/2010  . CHEST PAIN 02/11/2010  . DEPRESSION/ANXIETY 02/11/2010    Orientation RESPIRATION BLADDER Height & Weight     Self,Time,Situation,Place  Normal Continent,External catheter Weight: 235 lb 14.3 oz (107 kg) Height:  6\' 3"  (190.5 cm)  BEHAVIORAL SYMPTOMS/MOOD NEUROLOGICAL BOWEL NUTRITION STATUS      Continent Diet  AMBULATORY STATUS COMMUNICATION OF NEEDS Skin   Extensive Assist Verbally PU Stage and Appropriate Care,Surgical wounds,Wound Vac                       Personal Care Assistance Level of Assistance  Bathing,Feeding,Dressing Bathing Assistance: Limited assistance Feeding assistance: Independent Dressing Assistance: Limited assistance     Functional Limitations Info  Sight,Speech,Hearing Sight Info: Adequate Hearing Info: Adequate Speech Info: Adequate    SPECIAL CARE FACTORS FREQUENCY  OT (By licensed OT),PT (By licensed PT)     PT Frequency: 5x weekly OT Frequency: 5x weekly            Contractures Contractures Info: Not present    Additional Factors Info  Code Status,Allergies Code Status Info: Full code Allergies Info: hydrocodone, codein, cephalexin, vicodin, zocor  Current Medications (03/10/2021):  This is the current hospital active medication list Current Facility-Administered Medications  Medication Dose Route Frequency Provider Last Rate Last Admin  . oxyCODONE-acetaminophen (PERCOCET/ROXICET) 5-325 MG per tablet 1 tablet  1 tablet Oral Q6H PRN Robinson, [place] N, PA-C        Current Outpatient Medications  Medication Sig Dispense Refill  . acetaminophen (TYLENOL) 325 MG tablet Take 2 tablets (650 mg total) by mouth every 6 (six) hours as needed for mild pain (or Fever >/= 101).    Marland Kitchen albuterol (PROVENTIL) (2.5 MG/3ML) 0.083% nebulizer solution Inhale 3 mLs into the lungs every 4 (four) hours as needed for wheezing or shortness of breath. 75 mL 12  . amoxicillin-clavulanate (AUGMENTIN) 875-125 MG tablet Take 1 tablet by mouth 2 (two) times daily for 5 days. 14 tablet 0  . folic acid (FOLVITE) 1 MG tablet Take 1 mg by mouth daily.    . metFORMIN (GLUCOPHAGE) 500 MG tablet Take 500 mg by mouth 2 (two) times daily.    . metoprolol succinate (TOPROL-XL) 25 MG 24 hr tablet Take 1 tablet (25 mg total) by mouth daily.    . midodrine (PROAMATINE) 5 MG tablet Take 1 tablet (5 mg total) by mouth 3 (three) times daily with meals.    Marland Kitchen oxyCODONE-acetaminophen (PERCOCET/ROXICET) 5-325 MG tablet Take 1 tablet by mouth every 8 (eight) hours as needed for severe pain. 5 tablet 0  . pantoprazole (PROTONIX) 40 MG tablet Take 1 tablet (40 mg total) by mouth daily.    . rivaroxaban (XARELTO) 20 MG TABS tablet Take 20 mg by mouth daily with supper.       Discharge Medications: Please see discharge summary for a list of discharge medications.  Relevant Imaging Results:  Relevant Lab Results:   Additional Information SSN [id number] Vaccinated x2  Annalee Genta, [place]

## 2021-03-10 NOTE — ED Notes (Signed)
Pt found with gown thrown in floor and chux pad pulled over pt. This NT placed pt in new gown, provided warm blankets, and set up dinner tray.

## 2021-03-10 NOTE — TOC Initial Note (Signed)
Transition of Care [facility]) - Initial/Assessment Note    Patient Details  Name: Fernando Maddox MRN: [id number] Date of Birth: July 20, 1961  [facility] CM/SW Contact:    Annalee Genta, LCSW Phone Number: 03/10/2021, 8:12 AM  Clinical Narrative:                 CSW reviewed chart and consult and noted patient DC'd from hospital to [facility] 03/09/2021. CSW notes family request for new placement. CSW informed family patient would need to return to [facility] and coordinate with their team for new placement. CSW was informed by family they absolutely refused patient to return. CSW informed family a disposition would be needed as patient cannot remain in the ED for several days seeking placement. CSW noted they would be open to revisiting this tomorrow. CSW will send initial referral but reinforced with family patient cannot remain in the ED extensively when he has a current SNF.   Expected Discharge Plan: Skilled Nursing Facility Barriers to Discharge: SNF Pending bed offer   Patient Goals and CMS Choice   CMS Medicare.gov Compare Post Acute Care list provided to:: Patient Choice offered to / list presented to : [facility]  Expected Discharge Plan and Services Expected Discharge Plan: Skilled Nursing Facility     Post Acute Care Choice: Skilled Nursing Facility Living arrangements for the past 2 months: Single Family Home                                      Prior Living Arrangements/Services Living arrangements for the past 2 months: Single Family Home Lives with:: Self Patient language and need for interpreter reviewed:: Yes Do you feel safe going back to the place where you live?: Yes      Need for Family Participation in Patient Care: Yes (Comment) Care giver support system in place?: Yes (comment)   Criminal Activity/Legal Involvement Pertinent to Current Situation/Hospitalization: No - Comment as needed  Activities of Daily Living      Permission  Sought/Granted Permission sought to share information with : Facility Contractor granted to share information with : Yes, Verbal Permission Granted              Emotional Assessment Appearance:: Appears stated age,Well-Groomed     Orientation: : Fluctuating Orientation (Suspected and/or reported Sundowners) Alcohol / Substance Use: Not Applicable Psych Involvement: No (comment)  Admission diagnosis:  PLACEMENT Patient Active Problem List   Diagnosis Date Noted  . Dehiscence of amputation stump (HCC)   . Foot ulcer (HCC)   . Acute combined systolic and diastolic congestive heart failure (HCC)   . Hyponatremia   . Ulcer of left foot with necrosis of bone (HCC)   . Septic shock (HCC) 02/14/2021  . Atrial fibrillation (HCC)   . Cerebrovascular disease   . Chronic pain syndrome   . Hyperlipidemia   . Shoulder pain   . Anemia   . GERD (gastroesophageal reflux disease)   . Hypercholesteremia   . Hypogonadism male   . Rotator cuff syndrome   . Sleep apnea   . Chronic anticoagulation 03/25/2018  . Chronic systolic congestive heart failure (HCC) 03/25/2018  . Osteomyelitis, unspecified (HCC) 10/06/2017  . Diabetic ulcer of toe of right foot associated with type 2 diabetes mellitus (HCC) 10/01/2017  . Cellulitis 09/29/2017  . History of CVA (cerebrovascular accident) 09/29/2017  . Polysubstance abuse (HCC) 09/29/2017  .  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 Community acquired pneumonia 08/11/2016  . Tobacco use 08/11/2016  . Acute combined systolic and diastolic CHF, NYHA class 1 (HCC) 08/07/2016  . 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 History of drug abuse (HCC) 08/05/2016  . Osteoarthritis 08/05/2016  . PAF (paroxysmal atrial fibrillation) (HCC) 08/05/2016  . Stroke (HCC) 08/05/2016  . Diabetes mellitus, type 2 (HCC) 02/11/2010  . Hyperlipemia 02/11/2010  . HYPERTENSION,  UNSPECIFIED 02/11/2010  . CAD (coronary artery disease) 02/11/2010  . Hypertension 02/11/2010  . ESOPHAGEAL STRICTURE 02/11/2010  . BACK PAIN 02/11/2010  . HEADACHE 02/11/2010  . CHEST PAIN 02/11/2010  . DEPRESSION/ANXIETY 02/11/2010   PCP:  Crist Fat, MD Pharmacy:   [facility] [address] [place] [address] Phone: [phone] Fax: [phone]     Social Determinants of Health (SDOH) Interventions    Readmission Risk Interventions No flowsheet data found.

## 2021-03-10 NOTE — ED Notes (Signed)
Patient has a Condom Cath it had came off and another was replaced.

## 2021-03-10 NOTE — Consult Note (Signed)
WOC Nurse Consult Note: Remote Visit Reason for Consult: Non functioning NPWT (Prevena) device. Prevena NPWT is typically removed after 5-6 days and discarded. Patient is 4 days post op. Wound type: Surgical Pressure Injury POA:NA Measurement: Incision not measured  Wound bed: N/A Dressing procedure/placement/frequency: I have provided Nursing in the ED with guidance for the care of the nonfunctioning NPWT device (Prevena) in that it is to be removed (all foam dressings beneath the transparent thin film dressing down to the approximated wound bed), the wound cleansed with NS and an ABD placed over the approximated wound secured with Kerlix roll gauze and paper tape. An ACE bandage is to be applied over the Kerlix rolled gauze.  I recommend that Dr. Lajoyce Corners be notified of the patient's presence in the ED as he is a recent post op patient (03/06/21).  He can provide further guidance on the care of this patient.  WOC nursing team will not follow, but will remain available to this patient, the nursing and medical teams.  Please re-consult if needed. Thanks, Ladona Mow, MSN, RN, GNP, [place]  Pager# [phone]

## 2021-03-10 NOTE — ED Notes (Signed)
Pt began hyperventilating with increasing anxiety and agitation. Pt's O2 remained within normal limits on 2L [place] for comfort and sleep. Pt encouraged to slow down breathing and breathe in nose and out mouth. Pt finally relaxed and is breathing normally. O2 100%.

## 2021-03-11 DIAGNOSIS — D649 Anemia, unspecified: Secondary | ICD-10-CM | POA: Diagnosis present

## 2021-03-11 DIAGNOSIS — T8781 Dehiscence of amputation stump: Secondary | ICD-10-CM | POA: Diagnosis present

## 2021-03-11 DIAGNOSIS — N179 Acute kidney failure, unspecified: Secondary | ICD-10-CM | POA: Diagnosis present

## 2021-03-11 DIAGNOSIS — I480 Paroxysmal atrial fibrillation: Secondary | ICD-10-CM | POA: Diagnosis not present

## 2021-03-11 DIAGNOSIS — Z8673 Personal history of transient ischemic attack (TIA), and cerebral infarction without residual deficits: Secondary | ICD-10-CM | POA: Diagnosis not present

## 2021-03-11 DIAGNOSIS — R7401 Elevation of levels of liver transaminase levels: Secondary | ICD-10-CM | POA: Diagnosis present

## 2021-03-11 DIAGNOSIS — R0602 Shortness of breath: Secondary | ICD-10-CM | POA: Diagnosis not present

## 2021-03-11 DIAGNOSIS — K219 Gastro-esophageal reflux disease without esophagitis: Secondary | ICD-10-CM | POA: Diagnosis present

## 2021-03-11 DIAGNOSIS — R6521 Severe sepsis with septic shock: Secondary | ICD-10-CM | POA: Diagnosis present

## 2021-03-11 DIAGNOSIS — I509 Heart failure, unspecified: Secondary | ICD-10-CM | POA: Diagnosis not present

## 2021-03-11 DIAGNOSIS — M4856XA Collapsed vertebra, not elsewhere classified, lumbar region, initial encounter for fracture: Secondary | ICD-10-CM | POA: Diagnosis not present

## 2021-03-11 DIAGNOSIS — A419 Sepsis, unspecified organism: Secondary | ICD-10-CM | POA: Diagnosis present

## 2021-03-11 DIAGNOSIS — E7800 Pure hypercholesterolemia, unspecified: Secondary | ICD-10-CM | POA: Diagnosis present

## 2021-03-11 DIAGNOSIS — Z66 Do not resuscitate: Secondary | ICD-10-CM | POA: Diagnosis present

## 2021-03-11 DIAGNOSIS — J9811 Atelectasis: Secondary | ICD-10-CM | POA: Diagnosis not present

## 2021-03-11 DIAGNOSIS — R0689 Other abnormalities of breathing: Secondary | ICD-10-CM | POA: Diagnosis not present

## 2021-03-11 DIAGNOSIS — R059 Cough, unspecified: Secondary | ICD-10-CM | POA: Diagnosis not present

## 2021-03-11 DIAGNOSIS — R579 Shock, unspecified: Secondary | ICD-10-CM | POA: Diagnosis not present

## 2021-03-11 DIAGNOSIS — I700 Atherosclerosis of aorta: Secondary | ICD-10-CM | POA: Diagnosis not present

## 2021-03-11 DIAGNOSIS — K3189 Other diseases of stomach and duodenum: Secondary | ICD-10-CM | POA: Diagnosis not present

## 2021-03-11 DIAGNOSIS — R1110 Vomiting, unspecified: Secondary | ICD-10-CM | POA: Diagnosis not present

## 2021-03-11 DIAGNOSIS — R571 Hypovolemic shock: Secondary | ICD-10-CM | POA: Diagnosis present

## 2021-03-11 DIAGNOSIS — E119 Type 2 diabetes mellitus without complications: Secondary | ICD-10-CM | POA: Diagnosis not present

## 2021-03-11 DIAGNOSIS — M79605 Pain in left leg: Secondary | ICD-10-CM | POA: Diagnosis not present

## 2021-03-11 DIAGNOSIS — I10 Essential (primary) hypertension: Secondary | ICD-10-CM | POA: Diagnosis present

## 2021-03-11 DIAGNOSIS — I5043 Acute on chronic combined systolic (congestive) and diastolic (congestive) heart failure: Secondary | ICD-10-CM | POA: Diagnosis not present

## 2021-03-11 DIAGNOSIS — I429 Cardiomyopathy, unspecified: Secondary | ICD-10-CM | POA: Diagnosis present

## 2021-03-11 DIAGNOSIS — I517 Cardiomegaly: Secondary | ICD-10-CM | POA: Diagnosis not present

## 2021-03-11 DIAGNOSIS — E1140 Type 2 diabetes mellitus with diabetic neuropathy, unspecified: Secondary | ICD-10-CM | POA: Diagnosis present

## 2021-03-11 DIAGNOSIS — R451 Restlessness and agitation: Secondary | ICD-10-CM | POA: Diagnosis not present

## 2021-03-11 DIAGNOSIS — R7989 Other specified abnormal findings of blood chemistry: Secondary | ICD-10-CM | POA: Diagnosis present

## 2021-03-11 DIAGNOSIS — I5082 Biventricular heart failure: Secondary | ICD-10-CM | POA: Diagnosis present

## 2021-03-11 DIAGNOSIS — T383X5A Adverse effect of insulin and oral hypoglycemic [antidiabetic] drugs, initial encounter: Secondary | ICD-10-CM | POA: Diagnosis present

## 2021-03-11 DIAGNOSIS — E1165 Type 2 diabetes mellitus with hyperglycemia: Secondary | ICD-10-CM | POA: Diagnosis present

## 2021-03-11 DIAGNOSIS — K6389 Other specified diseases of intestine: Secondary | ICD-10-CM | POA: Diagnosis not present

## 2021-03-11 DIAGNOSIS — I482 Chronic atrial fibrillation, unspecified: Secondary | ICD-10-CM | POA: Diagnosis present

## 2021-03-11 DIAGNOSIS — Z20822 Contact with and (suspected) exposure to covid-19: Secondary | ICD-10-CM | POA: Diagnosis present

## 2021-03-11 DIAGNOSIS — E1151 Type 2 diabetes mellitus with diabetic peripheral angiopathy without gangrene: Secondary | ICD-10-CM | POA: Diagnosis present

## 2021-03-11 DIAGNOSIS — E11649 Type 2 diabetes mellitus with hypoglycemia without coma: Secondary | ICD-10-CM | POA: Diagnosis present

## 2021-03-11 DIAGNOSIS — J9610 Chronic respiratory failure, unspecified whether with hypoxia or hypercapnia: Secondary | ICD-10-CM | POA: Diagnosis not present

## 2021-03-11 DIAGNOSIS — Z0389 Encounter for observation for other suspected diseases and conditions ruled out: Secondary | ICD-10-CM | POA: Diagnosis not present

## 2021-03-11 DIAGNOSIS — Z20828 Contact with and (suspected) exposure to other viral communicable diseases: Secondary | ICD-10-CM | POA: Diagnosis not present

## 2021-03-11 DIAGNOSIS — E785 Hyperlipidemia, unspecified: Secondary | ICD-10-CM | POA: Diagnosis present

## 2021-03-11 DIAGNOSIS — I110 Hypertensive heart disease with heart failure: Secondary | ICD-10-CM | POA: Diagnosis present

## 2021-03-11 DIAGNOSIS — R570 Cardiogenic shock: Secondary | ICD-10-CM | POA: Diagnosis present

## 2021-03-11 DIAGNOSIS — G4733 Obstructive sleep apnea (adult) (pediatric): Secondary | ICD-10-CM | POA: Diagnosis present

## 2021-03-11 DIAGNOSIS — E11618 Type 2 diabetes mellitus with other diabetic arthropathy: Secondary | ICD-10-CM | POA: Diagnosis present

## 2021-03-11 DIAGNOSIS — R0902 Hypoxemia: Secondary | ICD-10-CM | POA: Diagnosis not present

## 2021-03-11 DIAGNOSIS — R1111 Vomiting without nausea: Secondary | ICD-10-CM | POA: Diagnosis not present

## 2021-03-11 DIAGNOSIS — G9341 Metabolic encephalopathy: Secondary | ICD-10-CM | POA: Diagnosis present

## 2021-03-11 DIAGNOSIS — Z89512 Acquired absence of left leg below knee: Secondary | ICD-10-CM | POA: Diagnosis not present

## 2021-03-11 DIAGNOSIS — I5022 Chronic systolic (congestive) heart failure: Secondary | ICD-10-CM | POA: Diagnosis not present

## 2021-03-11 DIAGNOSIS — R4182 Altered mental status, unspecified: Secondary | ICD-10-CM | POA: Diagnosis not present

## 2021-03-11 DIAGNOSIS — R000 Tachycardia, unspecified: Secondary | ICD-10-CM | POA: Diagnosis not present

## 2021-03-11 DIAGNOSIS — E872 Acidosis: Secondary | ICD-10-CM | POA: Diagnosis present

## 2021-03-11 DIAGNOSIS — Z87891 Personal history of nicotine dependence: Secondary | ICD-10-CM | POA: Diagnosis not present

## 2021-03-11 DIAGNOSIS — I2510 Atherosclerotic heart disease of native coronary artery without angina pectoris: Secondary | ICD-10-CM | POA: Diagnosis not present

## 2021-03-11 DIAGNOSIS — I5084 End stage heart failure: Secondary | ICD-10-CM | POA: Diagnosis present

## 2021-03-11 DIAGNOSIS — I679 Cerebrovascular disease, unspecified: Secondary | ICD-10-CM | POA: Diagnosis present

## 2021-03-11 DIAGNOSIS — I739 Peripheral vascular disease, unspecified: Secondary | ICD-10-CM | POA: Diagnosis not present

## 2021-03-11 DIAGNOSIS — R609 Edema, unspecified: Secondary | ICD-10-CM | POA: Diagnosis not present

## 2021-03-11 DIAGNOSIS — I4891 Unspecified atrial fibrillation: Secondary | ICD-10-CM | POA: Diagnosis present

## 2021-03-11 DIAGNOSIS — M1712 Unilateral primary osteoarthritis, left knee: Secondary | ICD-10-CM | POA: Diagnosis not present

## 2021-03-11 DIAGNOSIS — I5040 Unspecified combined systolic (congestive) and diastolic (congestive) heart failure: Secondary | ICD-10-CM | POA: Diagnosis present

## 2021-03-11 DIAGNOSIS — I5042 Chronic combined systolic (congestive) and diastolic (congestive) heart failure: Secondary | ICD-10-CM | POA: Diagnosis present

## 2021-03-11 DIAGNOSIS — E871 Hypo-osmolality and hyponatremia: Secondary | ICD-10-CM | POA: Diagnosis present

## 2021-03-11 DIAGNOSIS — J90 Pleural effusion, not elsewhere classified: Secondary | ICD-10-CM | POA: Diagnosis not present

## 2021-03-11 DIAGNOSIS — R5381 Other malaise: Secondary | ICD-10-CM | POA: Diagnosis not present

## 2021-03-11 DIAGNOSIS — J984 Other disorders of lung: Secondary | ICD-10-CM | POA: Diagnosis not present

## 2021-03-11 LAB — CBG MONITORING, ED
Glucose-Capillary: 158 mg/dL — ABNORMAL HIGH (ref 70–99)
Glucose-Capillary: 139 mg/dL — ABNORMAL HIGH (ref 70–99)
Glucose-Capillary: 149 mg/dL — ABNORMAL HIGH (ref 70–99)
Glucose-Capillary: 135 mg/dL — ABNORMAL HIGH (ref 70–99)

## 2021-03-11 LAB — POC SARS CORONAVIRUS 2 AG -  ED: SARSCOV2ONAVIRUS 2 AG: NEGATIVE

## 2021-03-11 MED ORDER — RIVAROXABAN 20 MG PO TABS
20.0000 mg | ORAL_TABLET | Freq: Every day | ORAL | Status: DC
  Administered 2021-03-11: 20 mg via ORAL
  Filled 2021-03-11: qty 1

## 2021-03-11 NOTE — ED Notes (Signed)
Report given to [facility] LPN at [facility].

## 2021-03-11 NOTE — Progress Notes (Signed)
[facility] declined offer

## 2021-03-11 NOTE — Progress Notes (Signed)
Message left a voicemail with [employer] and [place]

## 2021-03-11 NOTE — Discharge Instructions (Addendum)
You have been seen and discharged from the emergency department.  Follow-up with your orthopedics and primary provider for reevaluation and further care. Take home medications as prescribed. If you have any worsening symptoms or further concerns for your health please return to an emergency department for further evaluation.

## 2021-03-11 NOTE — Progress Notes (Signed)
[facility] stated that will send a wavier to corporate to see if patients insurance will be accepted. CSW also contacted patients mother and informed her.

## 2021-03-11 NOTE — Progress Notes (Signed)
#   for report [phone]

## 2021-03-11 NOTE — Progress Notes (Signed)
[facility] declined bed offer

## 2021-03-11 NOTE — Progress Notes (Signed)
CSW spoke with patient about his bed offer at [facility]. Patient stated no he did not want to go there and wanted to go home. CSW contacted patients mother. Patients mother stated that her son does not have a legal guardian. CSW explained that if patient does not agree to go to SNF then CSW cannot make him. Ms. Statzer stated that she understood. CSW also stated that patient cannot continue to call EMS from SNF when his requests are not met. CSW stated this could hurt his chances in the future with placement if he leaves against medical advice. Ms. Benally understood and stated she wanted to speak with her son because sometimes he gets confused and thinks he is going home. CSW stated the hospital will discharge him home if he doesn't agree to go to SNF.

## 2021-03-11 NOTE — Progress Notes (Signed)
CSW had a phone conversation with patient and his mother. Patient agreed to go to SNF after his mother told him after rehab he can come live with her but needs to get better before he comes. CSW was informed by [employer] that they rescinded patients bed offer due to behaviors.

## 2021-03-11 NOTE — Progress Notes (Signed)
Bed offer rescinded at [facility] due to corporate not approving insurance

## 2021-03-11 NOTE — Progress Notes (Signed)
Patient denied at [facility]

## 2021-03-11 NOTE — ED Provider Notes (Signed)
Spoke with transition of care and patient has received a bed at [facility].  Vitals are baseline for him at discharge, patient stable at DC.   Rozelle Logan, DO 03/11/21 1904

## 2021-03-11 NOTE — ED Notes (Signed)
Pt pulled off male external purewick and pulse off, pulse off placed back on pt

## 2021-03-11 NOTE — ED Notes (Signed)
Attempted to call [facility] x3 for report. No answer

## 2021-03-11 NOTE — ED Provider Notes (Addendum)
Emergency Medicine Observation Re-evaluation Note  Fernando Maddox is a 60 y.o. male, seen on rounds today.  Pt initially presented to the ED for complaints of No chief complaint on file. Currently, the patient is resting.  Physical Exam  BP 105/75   Pulse 97   Temp 98.1 F (36.7 C) (Oral)   Resp 19   Ht 6\' 3"  (1.905 m)   Wt 107 kg   SpO2 92%   BMI 29.48 kg/m  Physical Exam General: resting in bed Cardiac: warm and well perfused Lungs: even and unlabored Psych: calm  ED Course / MDM  EKG:   I have reviewed the labs performed to date as well as medications administered while in observation.  Recent changes in the last 24 hours include rested overnight, no acute events.  Plan  Current plan is for TOC to re eval patient this morning dispo per [facility]    [facility], MD 03/11/21 0857   ADDENDUM - discussed with TOC - pt lost his bed at [facility] - they are going to work on finding elsewhere, possibly [facility]. Wound care note from yesterday reviewed - his wound vac had stopped working.  I examined the leg this morning in more detail. No swelling/erythema to suggest any infection.  I discussed with ortho on call - PA 03/13/21 - he recommended keeping routine follow up as planned out pt with Lin Givens, no other changes needed at this time from surgical standpoint, ok if the wound vac came off a little early.    Lajoyce Corners, MD 03/11/21 1027

## 2021-03-11 NOTE — ED Notes (Signed)
Waiting on social work to finalize d/c orders

## 2021-03-11 NOTE — ED Notes (Signed)
This RN found pt with urine in bed with condom cath off, pulse ox off, with labored breathing. This RN and RN April changed linens, reapplied 02, reattached lines.

## 2021-03-11 NOTE — ED Notes (Signed)
I spoke with wound care nurse via phone to clarify dressing change orders. Old dressing removed with small amount serous drainage on bandage. No s/s infection. Redressed with abd pad, kerlix and ace wrap as directed by wound care nurse

## 2021-03-11 NOTE — Progress Notes (Signed)
CSW contacted [facility] who stated they would review patients referral.

## 2021-03-11 NOTE — Progress Notes (Signed)
CSW spoke with Pt at bedside to discuss d/c to [facility]/ Pt verbalized agreement with d/c plan.

## 2021-03-12 DIAGNOSIS — I509 Heart failure, unspecified: Secondary | ICD-10-CM | POA: Diagnosis not present

## 2021-03-13 DIAGNOSIS — E871 Hypo-osmolality and hyponatremia: Secondary | ICD-10-CM | POA: Diagnosis not present

## 2021-03-13 DIAGNOSIS — I10 Essential (primary) hypertension: Secondary | ICD-10-CM | POA: Diagnosis not present

## 2021-03-13 DIAGNOSIS — I509 Heart failure, unspecified: Secondary | ICD-10-CM | POA: Diagnosis not present

## 2021-03-13 DIAGNOSIS — E7800 Pure hypercholesterolemia, unspecified: Secondary | ICD-10-CM | POA: Diagnosis not present

## 2021-03-13 DIAGNOSIS — E1165 Type 2 diabetes mellitus with hyperglycemia: Secondary | ICD-10-CM | POA: Diagnosis not present

## 2021-03-13 DIAGNOSIS — G4733 Obstructive sleep apnea (adult) (pediatric): Secondary | ICD-10-CM | POA: Diagnosis not present

## 2021-03-13 DIAGNOSIS — I4891 Unspecified atrial fibrillation: Secondary | ICD-10-CM | POA: Diagnosis not present

## 2021-03-14 DIAGNOSIS — E1165 Type 2 diabetes mellitus with hyperglycemia: Secondary | ICD-10-CM | POA: Diagnosis not present

## 2021-03-14 DIAGNOSIS — J9610 Chronic respiratory failure, unspecified whether with hypoxia or hypercapnia: Secondary | ICD-10-CM | POA: Diagnosis not present

## 2021-03-14 DIAGNOSIS — I509 Heart failure, unspecified: Secondary | ICD-10-CM | POA: Diagnosis not present

## 2021-03-14 DIAGNOSIS — I739 Peripheral vascular disease, unspecified: Secondary | ICD-10-CM | POA: Diagnosis not present

## 2021-03-15 ENCOUNTER — Other Ambulatory Visit: Payer: Self-pay

## 2021-03-15 ENCOUNTER — Emergency Department (HOSPITAL_COMMUNITY): Payer: Medicare Other

## 2021-03-15 ENCOUNTER — Emergency Department (HOSPITAL_COMMUNITY)
Admit: 2021-03-15 | Discharge: 2021-03-15 | Disposition: A | Discharge status: Home / Self Care | Payer: Medicare Other | Attending: Emergency Medicine | Admitting: Emergency Medicine

## 2021-03-15 ENCOUNTER — Inpatient Hospital Stay (HOSPITAL_COMMUNITY): Payer: Medicare Other

## 2021-03-15 ENCOUNTER — Inpatient Hospital Stay (HOSPITAL_COMMUNITY)
Admission: EM | Admit: 2021-03-15 | Discharge: 2021-03-22 | DRG: 871 | Disposition: A | Discharge status: Skilled Nursing Facility | Payer: Medicare Other | Source: Skilled Nursing Facility | Attending: Student | Admitting: Student

## 2021-03-15 DIAGNOSIS — F419 Anxiety disorder, unspecified: Secondary | ICD-10-CM | POA: Diagnosis present

## 2021-03-15 DIAGNOSIS — R059 Cough, unspecified: Secondary | ICD-10-CM | POA: Diagnosis not present

## 2021-03-15 DIAGNOSIS — Z7901 Long term (current) use of anticoagulants: Secondary | ICD-10-CM

## 2021-03-15 DIAGNOSIS — M1712 Unilateral primary osteoarthritis, left knee: Secondary | ICD-10-CM | POA: Diagnosis not present

## 2021-03-15 DIAGNOSIS — E11618 Type 2 diabetes mellitus with other diabetic arthropathy: Secondary | ICD-10-CM | POA: Diagnosis present

## 2021-03-15 DIAGNOSIS — I700 Atherosclerosis of aorta: Secondary | ICD-10-CM | POA: Diagnosis not present

## 2021-03-15 DIAGNOSIS — L97519 Non-pressure chronic ulcer of other part of right foot with unspecified severity: Secondary | ICD-10-CM | POA: Diagnosis present

## 2021-03-15 DIAGNOSIS — R609 Edema, unspecified: Secondary | ICD-10-CM | POA: Diagnosis not present

## 2021-03-15 DIAGNOSIS — E669 Obesity, unspecified: Secondary | ICD-10-CM | POA: Diagnosis present

## 2021-03-15 DIAGNOSIS — R6521 Severe sepsis with septic shock: Secondary | ICD-10-CM | POA: Diagnosis present

## 2021-03-15 DIAGNOSIS — R1111 Vomiting without nausea: Secondary | ICD-10-CM | POA: Diagnosis not present

## 2021-03-15 DIAGNOSIS — I482 Chronic atrial fibrillation, unspecified: Secondary | ICD-10-CM | POA: Diagnosis present

## 2021-03-15 DIAGNOSIS — Z452 Encounter for adjustment and management of vascular access device: Secondary | ICD-10-CM

## 2021-03-15 DIAGNOSIS — T383X5A Adverse effect of insulin and oral hypoglycemic [antidiabetic] drugs, initial encounter: Secondary | ICD-10-CM | POA: Diagnosis present

## 2021-03-15 DIAGNOSIS — G4733 Obstructive sleep apnea (adult) (pediatric): Secondary | ICD-10-CM | POA: Diagnosis present

## 2021-03-15 DIAGNOSIS — I480 Paroxysmal atrial fibrillation: Secondary | ICD-10-CM | POA: Diagnosis not present

## 2021-03-15 DIAGNOSIS — I5040 Unspecified combined systolic (congestive) and diastolic (congestive) heart failure: Secondary | ICD-10-CM | POA: Diagnosis present

## 2021-03-15 DIAGNOSIS — A419 Sepsis, unspecified organism: Principal | ICD-10-CM | POA: Diagnosis present

## 2021-03-15 DIAGNOSIS — R451 Restlessness and agitation: Secondary | ICD-10-CM | POA: Diagnosis not present

## 2021-03-15 DIAGNOSIS — E871 Hypo-osmolality and hyponatremia: Secondary | ICD-10-CM | POA: Diagnosis present

## 2021-03-15 DIAGNOSIS — E1165 Type 2 diabetes mellitus with hyperglycemia: Secondary | ICD-10-CM | POA: Diagnosis present

## 2021-03-15 DIAGNOSIS — R579 Shock, unspecified: Secondary | ICD-10-CM | POA: Diagnosis not present

## 2021-03-15 DIAGNOSIS — T502X5A Adverse effect of carbonic-anhydrase inhibitors, benzothiadiazides and other diuretics, initial encounter: Secondary | ICD-10-CM | POA: Diagnosis not present

## 2021-03-15 DIAGNOSIS — G9341 Metabolic encephalopathy: Secondary | ICD-10-CM | POA: Diagnosis present

## 2021-03-15 DIAGNOSIS — Z0389 Encounter for observation for other suspected diseases and conditions ruled out: Secondary | ICD-10-CM | POA: Diagnosis not present

## 2021-03-15 DIAGNOSIS — K3189 Other diseases of stomach and duodenum: Secondary | ICD-10-CM | POA: Diagnosis not present

## 2021-03-15 DIAGNOSIS — I5082 Biventricular heart failure: Secondary | ICD-10-CM | POA: Diagnosis present

## 2021-03-15 DIAGNOSIS — E11649 Type 2 diabetes mellitus with hypoglycemia without coma: Secondary | ICD-10-CM | POA: Diagnosis present

## 2021-03-15 DIAGNOSIS — E1151 Type 2 diabetes mellitus with diabetic peripheral angiopathy without gangrene: Secondary | ICD-10-CM | POA: Diagnosis present

## 2021-03-15 DIAGNOSIS — Z7984 Long term (current) use of oral hypoglycemic drugs: Secondary | ICD-10-CM

## 2021-03-15 DIAGNOSIS — E1140 Type 2 diabetes mellitus with diabetic neuropathy, unspecified: Secondary | ICD-10-CM | POA: Diagnosis present

## 2021-03-15 DIAGNOSIS — I5084 End stage heart failure: Secondary | ICD-10-CM | POA: Diagnosis present

## 2021-03-15 DIAGNOSIS — Z20822 Contact with and (suspected) exposure to covid-19: Secondary | ICD-10-CM | POA: Diagnosis present

## 2021-03-15 DIAGNOSIS — Z888 Allergy status to other drugs, medicaments and biological substances status: Secondary | ICD-10-CM

## 2021-03-15 DIAGNOSIS — R7401 Elevation of levels of liver transaminase levels: Secondary | ICD-10-CM | POA: Diagnosis present

## 2021-03-15 DIAGNOSIS — Z8673 Personal history of transient ischemic attack (TIA), and cerebral infarction without residual deficits: Secondary | ICD-10-CM | POA: Diagnosis not present

## 2021-03-15 DIAGNOSIS — M869 Osteomyelitis, unspecified: Secondary | ICD-10-CM | POA: Diagnosis present

## 2021-03-15 DIAGNOSIS — R571 Hypovolemic shock: Secondary | ICD-10-CM | POA: Diagnosis present

## 2021-03-15 DIAGNOSIS — Z9114 Patient's other noncompliance with medication regimen: Secondary | ICD-10-CM

## 2021-03-15 DIAGNOSIS — I5042 Chronic combined systolic (congestive) and diastolic (congestive) heart failure: Secondary | ICD-10-CM | POA: Diagnosis present

## 2021-03-15 DIAGNOSIS — I110 Hypertensive heart disease with heart failure: Secondary | ICD-10-CM | POA: Diagnosis present

## 2021-03-15 DIAGNOSIS — G894 Chronic pain syndrome: Secondary | ICD-10-CM | POA: Diagnosis present

## 2021-03-15 DIAGNOSIS — E785 Hyperlipidemia, unspecified: Secondary | ICD-10-CM | POA: Diagnosis present

## 2021-03-15 DIAGNOSIS — D649 Anemia, unspecified: Secondary | ICD-10-CM | POA: Diagnosis present

## 2021-03-15 DIAGNOSIS — R0602 Shortness of breath: Secondary | ICD-10-CM | POA: Diagnosis not present

## 2021-03-15 DIAGNOSIS — E7800 Pure hypercholesterolemia, unspecified: Secondary | ICD-10-CM | POA: Diagnosis present

## 2021-03-15 DIAGNOSIS — I2510 Atherosclerotic heart disease of native coronary artery without angina pectoris: Secondary | ICD-10-CM | POA: Diagnosis present

## 2021-03-15 DIAGNOSIS — Z881 Allergy status to other antibiotic agents status: Secondary | ICD-10-CM

## 2021-03-15 DIAGNOSIS — N179 Acute kidney failure, unspecified: Secondary | ICD-10-CM | POA: Diagnosis present

## 2021-03-15 DIAGNOSIS — I679 Cerebrovascular disease, unspecified: Secondary | ICD-10-CM | POA: Diagnosis present

## 2021-03-15 DIAGNOSIS — R5381 Other malaise: Secondary | ICD-10-CM | POA: Diagnosis not present

## 2021-03-15 DIAGNOSIS — J90 Pleural effusion, not elsewhere classified: Secondary | ICD-10-CM | POA: Diagnosis not present

## 2021-03-15 DIAGNOSIS — I429 Cardiomyopathy, unspecified: Secondary | ICD-10-CM | POA: Diagnosis present

## 2021-03-15 DIAGNOSIS — T8781 Dehiscence of amputation stump: Secondary | ICD-10-CM | POA: Diagnosis present

## 2021-03-15 DIAGNOSIS — Z66 Do not resuscitate: Secondary | ICD-10-CM | POA: Diagnosis present

## 2021-03-15 DIAGNOSIS — I5043 Acute on chronic combined systolic (congestive) and diastolic (congestive) heart failure: Secondary | ICD-10-CM | POA: Diagnosis not present

## 2021-03-15 DIAGNOSIS — E872 Acidosis: Secondary | ICD-10-CM | POA: Diagnosis present

## 2021-03-15 DIAGNOSIS — K6389 Other specified diseases of intestine: Secondary | ICD-10-CM | POA: Diagnosis not present

## 2021-03-15 DIAGNOSIS — I4891 Unspecified atrial fibrillation: Secondary | ICD-10-CM | POA: Diagnosis present

## 2021-03-15 DIAGNOSIS — Z89512 Acquired absence of left leg below knee: Secondary | ICD-10-CM | POA: Diagnosis not present

## 2021-03-15 DIAGNOSIS — K219 Gastro-esophageal reflux disease without esophagitis: Secondary | ICD-10-CM | POA: Diagnosis present

## 2021-03-15 DIAGNOSIS — R531 Weakness: Secondary | ICD-10-CM | POA: Diagnosis not present

## 2021-03-15 DIAGNOSIS — F32A Depression, unspecified: Secondary | ICD-10-CM | POA: Diagnosis present

## 2021-03-15 DIAGNOSIS — R000 Tachycardia, unspecified: Secondary | ICD-10-CM | POA: Diagnosis not present

## 2021-03-15 DIAGNOSIS — R0902 Hypoxemia: Secondary | ICD-10-CM | POA: Diagnosis present

## 2021-03-15 DIAGNOSIS — R570 Cardiogenic shock: Secondary | ICD-10-CM | POA: Diagnosis present

## 2021-03-15 DIAGNOSIS — J9811 Atelectasis: Secondary | ICD-10-CM | POA: Diagnosis not present

## 2021-03-15 DIAGNOSIS — R7989 Other specified abnormal findings of blood chemistry: Secondary | ICD-10-CM | POA: Diagnosis present

## 2021-03-15 DIAGNOSIS — I10 Essential (primary) hypertension: Secondary | ICD-10-CM | POA: Diagnosis present

## 2021-03-15 DIAGNOSIS — R4182 Altered mental status, unspecified: Secondary | ICD-10-CM | POA: Diagnosis not present

## 2021-03-15 DIAGNOSIS — R1110 Vomiting, unspecified: Secondary | ICD-10-CM | POA: Diagnosis not present

## 2021-03-15 DIAGNOSIS — Z79899 Other long term (current) drug therapy: Secondary | ICD-10-CM

## 2021-03-15 DIAGNOSIS — I517 Cardiomegaly: Secondary | ICD-10-CM | POA: Diagnosis not present

## 2021-03-15 DIAGNOSIS — Z87891 Personal history of nicotine dependence: Secondary | ICD-10-CM

## 2021-03-15 DIAGNOSIS — I5022 Chronic systolic (congestive) heart failure: Secondary | ICD-10-CM | POA: Diagnosis present

## 2021-03-15 DIAGNOSIS — I5023 Acute on chronic systolic (congestive) heart failure: Secondary | ICD-10-CM | POA: Diagnosis present

## 2021-03-15 DIAGNOSIS — Z885 Allergy status to narcotic agent status: Secondary | ICD-10-CM

## 2021-03-15 DIAGNOSIS — M4856XA Collapsed vertebra, not elsewhere classified, lumbar region, initial encounter for fracture: Secondary | ICD-10-CM | POA: Diagnosis not present

## 2021-03-15 DIAGNOSIS — Z6833 Body mass index (BMI) 33.0-33.9, adult: Secondary | ICD-10-CM

## 2021-03-15 DIAGNOSIS — Z8701 Personal history of pneumonia (recurrent): Secondary | ICD-10-CM

## 2021-03-15 DIAGNOSIS — R0689 Other abnormalities of breathing: Secondary | ICD-10-CM | POA: Diagnosis not present

## 2021-03-15 DIAGNOSIS — Z8249 Family history of ischemic heart disease and other diseases of the circulatory system: Secondary | ICD-10-CM

## 2021-03-15 DIAGNOSIS — Z91030 Bee allergy status: Secondary | ICD-10-CM

## 2021-03-15 DIAGNOSIS — E11621 Type 2 diabetes mellitus with foot ulcer: Secondary | ICD-10-CM | POA: Diagnosis present

## 2021-03-15 DIAGNOSIS — E876 Hypokalemia: Secondary | ICD-10-CM | POA: Diagnosis not present

## 2021-03-15 LAB — COOXEMETRY PANEL
Carboxyhemoglobin: 1.8 % — ABNORMAL HIGH (ref 0.5–1.5)
Carboxyhemoglobin: 1.7 % — ABNORMAL HIGH (ref 0.5–1.5)
Methemoglobin: 0.8 % (ref 0.0–1.5)
Methemoglobin: 1 % (ref 0.0–1.5)
O2 Saturation: 53.4 %
O2 Saturation: 46.7 %
Total hemoglobin: 9.1 g/dL — ABNORMAL LOW (ref 12.0–16.0)
Total hemoglobin: 9.1 g/dL — ABNORMAL LOW (ref 12.0–16.0)

## 2021-03-15 LAB — COMPREHENSIVE METABOLIC PANEL
ALT: 49 U/L — ABNORMAL HIGH (ref 0–44)
ALT: 85 U/L — ABNORMAL HIGH (ref 0–44)
AST: 45 U/L — ABNORMAL HIGH (ref 15–41)
AST: 164 U/L — ABNORMAL HIGH (ref 15–41)
Albumin: 2.7 g/dL — ABNORMAL LOW (ref 3.5–5.0)
Albumin: 2.2 g/dL — ABNORMAL LOW (ref 3.5–5.0)
Alkaline Phosphatase: 97 U/L (ref 38–126)
Alkaline Phosphatase: 78 U/L (ref 38–126)
Anion gap: 24 — ABNORMAL HIGH (ref 5–15)
Anion gap: 14 (ref 5–15)
BUN: 28 mg/dL — ABNORMAL HIGH (ref 6–20)
BUN: 34 mg/dL — ABNORMAL HIGH (ref 6–20)
CO2: 12 mmol/L — ABNORMAL LOW (ref 22–32)
CO2: 18 mmol/L — ABNORMAL LOW (ref 22–32)
Calcium: 8.9 mg/dL (ref 8.9–10.3)
Calcium: 8.4 mg/dL — ABNORMAL LOW (ref 8.9–10.3)
Chloride: 96 mmol/L — ABNORMAL LOW (ref 98–111)
Chloride: 99 mmol/L (ref 98–111)
Creatinine, Ser: 1.61 mg/dL — ABNORMAL HIGH (ref 0.61–1.24)
Creatinine, Ser: 1.66 mg/dL — ABNORMAL HIGH (ref 0.61–1.24)
GFR, Estimated: 49 mL/min — ABNORMAL LOW (ref >60)
GFR, Estimated: 47 mL/min — ABNORMAL LOW (ref >60)
Glucose, Bld: 52 mg/dL — ABNORMAL LOW (ref 70–99)
Glucose, Bld: 177 mg/dL — ABNORMAL HIGH (ref 70–99)
Potassium: 5.4 mmol/L — ABNORMAL HIGH (ref 3.5–5.1)
Potassium: 5.1 mmol/L (ref 3.5–5.1)
Sodium: 132 mmol/L — ABNORMAL LOW (ref 135–145)
Sodium: 131 mmol/L — ABNORMAL LOW (ref 135–145)
Total Bilirubin: 3.6 mg/dL — ABNORMAL HIGH (ref 0.3–1.2)
Total Bilirubin: 2.9 mg/dL — ABNORMAL HIGH (ref 0.3–1.2)
Total Protein: 7.2 g/dL (ref 6.5–8.1)
Total Protein: 5.7 g/dL — ABNORMAL LOW (ref 6.5–8.1)

## 2021-03-15 LAB — CK: Total CK: 113 U/L (ref 49–397)

## 2021-03-15 LAB — I-STAT VENOUS BLOOD GAS, ED
Acid-base deficit: 21 mmol/L — ABNORMAL HIGH (ref 0.0–2.0)
Bicarbonate: 5.9 mmol/L — ABNORMAL LOW (ref 20.0–28.0)
Calcium, Ion: 0.72 mmol/L — CL (ref 1.15–1.40)
HCT: 20 % — ABNORMAL LOW (ref 39.0–52.0)
Hemoglobin: 6.8 g/dL — CL (ref 13.0–17.0)
O2 Saturation: 73 %
Potassium: 3 mmol/L — ABNORMAL LOW (ref 3.5–5.1)
Sodium: 144 mmol/L (ref 135–145)
TCO2: 6 mmol/L — ABNORMAL LOW (ref 22–32)
pCO2, Ven: 16.3 mmHg — CL (ref 44.0–60.0)
pH, Ven: 7.167 — CL (ref 7.250–7.430)
pO2, Ven: 47 mmHg — ABNORMAL HIGH (ref 32.0–45.0)

## 2021-03-15 LAB — URINALYSIS, MICROSCOPIC (REFLEX): RBC / HPF: >50 RBC/hpf (ref 0–5)

## 2021-03-15 LAB — COMPREHENSIVE METABOLIC PANEL WITH GFR
ALT: 78 U/L — ABNORMAL HIGH (ref 0–44)
AST: 128 U/L — ABNORMAL HIGH (ref 15–41)
Albumin: 2.9 g/dL — ABNORMAL LOW (ref 3.5–5.0)
Alkaline Phosphatase: 100 U/L (ref 38–126)
Anion gap: 25 — ABNORMAL HIGH (ref 5–15)
BUN: 31 mg/dL — ABNORMAL HIGH (ref 6–20)
CO2: 8 mmol/L — ABNORMAL LOW (ref 22–32)
Calcium: 8.7 mg/dL — ABNORMAL LOW (ref 8.9–10.3)
Chloride: 99 mmol/L (ref 98–111)
Creatinine, Ser: 1.73 mg/dL — ABNORMAL HIGH (ref 0.61–1.24)
GFR, Estimated: 45 mL/min — ABNORMAL LOW
Glucose, Bld: 111 mg/dL — ABNORMAL HIGH (ref 70–99)
Potassium: 6.2 mmol/L — ABNORMAL HIGH (ref 3.5–5.1)
Sodium: 132 mmol/L — ABNORMAL LOW (ref 135–145)
Total Bilirubin: 4.8 mg/dL — ABNORMAL HIGH (ref 0.3–1.2)
Total Protein: 7.7 g/dL (ref 6.5–8.1)

## 2021-03-15 LAB — POCT I-STAT 7, (LYTES, BLD GAS, ICA,H+H)
Acid-base deficit: 15 mmol/L — ABNORMAL HIGH (ref 0.0–2.0)
Bicarbonate: 9.5 mmol/L — ABNORMAL LOW (ref 20.0–28.0)
Calcium, Ion: 1.13 mmol/L — ABNORMAL LOW (ref 1.15–1.40)
HCT: 32 % — ABNORMAL LOW (ref 39.0–52.0)
Hemoglobin: 10.9 g/dL — ABNORMAL LOW (ref 13.0–17.0)
O2 Saturation: 98 %
Patient temperature: 97.6
Potassium: 5.2 mmol/L — ABNORMAL HIGH (ref 3.5–5.1)
Sodium: 133 mmol/L — ABNORMAL LOW (ref 135–145)
TCO2: 10 mmol/L — ABNORMAL LOW (ref 22–32)
pCO2 arterial: 19.7 mmHg — CL (ref 32.0–48.0)
pH, Arterial: 7.287 — ABNORMAL LOW (ref 7.350–7.450)
pO2, Arterial: 105 mmHg (ref 83.0–108.0)

## 2021-03-15 LAB — URINALYSIS, ROUTINE W REFLEX MICROSCOPIC
Glucose, UA: NEGATIVE mg/dL
Ketones, ur: NEGATIVE mg/dL
Leukocytes,Ua: NEGATIVE
Nitrite: NEGATIVE
Protein, ur: >300 mg/dL — AB
Specific Gravity, Urine: >1.03 — ABNORMAL HIGH (ref 1.005–1.030)
pH: 5 (ref 5.0–8.0)

## 2021-03-15 LAB — CBG MONITORING, ED
Glucose-Capillary: 98 mg/dL (ref 70–99)
Glucose-Capillary: 52 mg/dL — ABNORMAL LOW (ref 70–99)
Glucose-Capillary: 112 mg/dL — ABNORMAL HIGH (ref 70–99)

## 2021-03-15 LAB — CBC WITH DIFFERENTIAL/PLATELET
Abs Immature Granulocytes: 0.11 10*3/uL — ABNORMAL HIGH (ref 0.00–0.07)
Basophils Absolute: 0 10*3/uL (ref 0.0–0.1)
Basophils Relative: 0 %
Eosinophils Absolute: 0 10*3/uL (ref 0.0–0.5)
Eosinophils Relative: 0 %
HCT: 38 % — ABNORMAL LOW (ref 39.0–52.0)
Hemoglobin: 11.2 g/dL — ABNORMAL LOW (ref 13.0–17.0)
Immature Granulocytes: 1 %
Lymphocytes Relative: 8 %
Lymphs Abs: 1 10*3/uL (ref 0.7–4.0)
MCH: 27.7 pg (ref 26.0–34.0)
MCHC: 29.5 g/dL — ABNORMAL LOW (ref 30.0–36.0)
MCV: 93.8 fL (ref 80.0–100.0)
Monocytes Absolute: 1.2 10*3/uL — ABNORMAL HIGH (ref 0.1–1.0)
Monocytes Relative: 10 %
Neutro Abs: 9.5 10*3/uL — ABNORMAL HIGH (ref 1.7–7.7)
Neutrophils Relative %: 81 %
Platelets: 409 10*3/uL — ABNORMAL HIGH (ref 150–400)
RBC: 4.05 MIL/uL — ABNORMAL LOW (ref 4.22–5.81)
RDW: 20.5 % — ABNORMAL HIGH (ref 11.5–15.5)
WBC: 11.9 10*3/uL — ABNORMAL HIGH (ref 4.0–10.5)
nRBC: 0.2 % (ref 0.0–0.2)

## 2021-03-15 LAB — PROTIME-INR
INR: 9.3 (ref 0.8–1.2)
Prothrombin Time: 75.5 s — ABNORMAL HIGH (ref 11.4–15.2)

## 2021-03-15 LAB — GLUCOSE, CAPILLARY
Glucose-Capillary: 119 mg/dL — ABNORMAL HIGH (ref 70–99)
Glucose-Capillary: 140 mg/dL — ABNORMAL HIGH (ref 70–99)
Glucose-Capillary: 180 mg/dL — ABNORMAL HIGH (ref 70–99)
Glucose-Capillary: 176 mg/dL — ABNORMAL HIGH (ref 70–99)

## 2021-03-15 LAB — CBC
HCT: 35.2 % — ABNORMAL LOW (ref 39.0–52.0)
Hemoglobin: 9.8 g/dL — ABNORMAL LOW (ref 13.0–17.0)
MCH: 27.8 pg (ref 26.0–34.0)
MCHC: 27.8 g/dL — ABNORMAL LOW (ref 30.0–36.0)
MCV: 100 fL (ref 80.0–100.0)
Platelets: 269 10*3/uL (ref 150–400)
RBC: 3.52 MIL/uL — ABNORMAL LOW (ref 4.22–5.81)
RDW: 20.4 % — ABNORMAL HIGH (ref 11.5–15.5)
WBC: 9.8 10*3/uL (ref 4.0–10.5)
nRBC: 0.3 % — ABNORMAL HIGH (ref 0.0–0.2)

## 2021-03-15 LAB — CREATININE, SERUM
Creatinine, Ser: 1.5 mg/dL — ABNORMAL HIGH (ref 0.61–1.24)
GFR, Estimated: 53 mL/min — ABNORMAL LOW

## 2021-03-15 LAB — TROPONIN I (HIGH SENSITIVITY)
Troponin I (High Sensitivity): 27 ng/L — ABNORMAL HIGH (ref <18)
Troponin I (High Sensitivity): 25 ng/L — ABNORMAL HIGH (ref <18)

## 2021-03-15 LAB — LACTIC ACID, PLASMA
Lactic Acid, Venous: >11 mmol/L (ref 0.5–1.9)
Lactic Acid, Venous: >11 mmol/L (ref 0.5–1.9)
Lactic Acid, Venous: >11 mmol/L (ref 0.5–1.9)
Lactic Acid, Venous: >11 mmol/L (ref 0.5–1.9)

## 2021-03-15 LAB — RESP PANEL BY RT-PCR (FLU A&B, COVID) ARPGX2
Influenza A by PCR: NEGATIVE
Influenza B by PCR: NEGATIVE
SARS Coronavirus 2 by RT PCR: NEGATIVE

## 2021-03-15 LAB — MRSA PCR SCREENING: MRSA by PCR: NEGATIVE

## 2021-03-15 LAB — CORTISOL: Cortisol, Plasma: 52.4 ug/dL

## 2021-03-15 LAB — AMMONIA: Ammonia: <9 umol/L — ABNORMAL LOW (ref 9–35)

## 2021-03-15 LAB — BETA-HYDROXYBUTYRIC ACID: Beta-Hydroxybutyric Acid: 0.22 mmol/L (ref 0.05–0.27)

## 2021-03-15 LAB — MAGNESIUM: Magnesium: 2.1 mg/dL (ref 1.7–2.4)

## 2021-03-15 LAB — LIPASE, BLOOD: Lipase: 34 U/L (ref 11–51)

## 2021-03-15 MED ORDER — LACTATED RINGERS IV BOLUS (SEPSIS)
1000.0000 mL | Freq: Once | INTRAVENOUS | Status: AC
  Administered 2021-03-15: 1000 mL via INTRAVENOUS

## 2021-03-15 MED ORDER — SODIUM CHLORIDE 0.9 % IV BOLUS
1000.0000 mL | Freq: Once | INTRAVENOUS | Status: AC
  Administered 2021-03-15: 500 mL via INTRAVENOUS

## 2021-03-15 MED ORDER — SODIUM ZIRCONIUM CYCLOSILICATE 10 G PO PACK
10.0000 g | PACK | Freq: Every day | ORAL | Status: DC
  Administered 2021-03-17: 10 g via ORAL
  Filled 2021-03-15 (×2): qty 1

## 2021-03-15 MED ORDER — TECHNETIUM TO 99M ALBUMIN AGGREGATED
4.2000 | Freq: Once | INTRAVENOUS | Status: AC | PRN
  Administered 2021-03-15: 4.2 via INTRAVENOUS

## 2021-03-15 MED ORDER — SODIUM CHLORIDE 0.9 % IV SOLN
250.0000 mL | INTRAVENOUS | Status: DC
  Administered 2021-03-15: 250 mL via INTRAVENOUS

## 2021-03-15 MED ORDER — ONDANSETRON HCL 4 MG/2ML IJ SOLN
4.0000 mg | Freq: Four times a day (QID) | INTRAMUSCULAR | Status: DC | PRN
  Administered 2021-03-15: 4 mg via INTRAVENOUS
  Filled 2021-03-15: qty 2

## 2021-03-15 MED ORDER — VANCOMYCIN HCL 1000 MG/200ML IV SOLN
1000.0000 mg | Freq: Two times a day (BID) | INTRAVENOUS | Status: DC
  Administered 2021-03-15 – 2021-03-17 (×4): 1000 mg via INTRAVENOUS
  Filled 2021-03-15 (×5): qty 200

## 2021-03-15 MED ORDER — INSULIN ASPART 100 UNIT/ML IJ SOLN
0.0000 [IU] | INTRAMUSCULAR | Status: DC
  Administered 2021-03-15 – 2021-03-16 (×4): 1 [IU] via SUBCUTANEOUS
  Administered 2021-03-16: 2 [IU] via SUBCUTANEOUS
  Administered 2021-03-17 – 2021-03-18 (×5): 1 [IU] via SUBCUTANEOUS
  Administered 2021-03-19: 2 [IU] via SUBCUTANEOUS
  Administered 2021-03-19: 1 [IU] via SUBCUTANEOUS

## 2021-03-15 MED ORDER — DEXTROSE 50 % IV SOLN
1.0000 | Freq: Once | INTRAVENOUS | Status: AC
  Administered 2021-03-15: 50 mL via INTRAVENOUS
  Filled 2021-03-15: qty 50

## 2021-03-15 MED ORDER — "THROMBI-PAD 3""X3"" EX PADS"
1.0000 | MEDICATED_PAD | Freq: Once | CUTANEOUS | Status: DC
  Filled 2021-03-15: qty 1

## 2021-03-15 MED ORDER — CHLORHEXIDINE GLUCONATE CLOTH 2 % EX PADS
6.0000 | MEDICATED_PAD | Freq: Every day | CUTANEOUS | Status: DC
  Administered 2021-03-15 – 2021-03-22 (×7): 6 via TOPICAL

## 2021-03-15 MED ORDER — DOBUTAMINE IN D5W 4-5 MG/ML-% IV SOLN
2.5000 ug/kg/min | INTRAVENOUS | Status: DC
  Administered 2021-03-16: 5 ug/kg/min via INTRAVENOUS
  Filled 2021-03-15: qty 250

## 2021-03-15 MED ORDER — PIPERACILLIN-TAZOBACTAM 3.375 G IVPB 30 MIN
3.3750 g | Freq: Once | INTRAVENOUS | Status: AC
  Administered 2021-03-15: 3.375 g via INTRAVENOUS
  Filled 2021-03-15: qty 50

## 2021-03-15 MED ORDER — SODIUM BICARBONATE 8.4 % IV SOLN
100.0000 meq | Freq: Once | INTRAVENOUS | Status: AC
  Administered 2021-03-15: 100 meq via INTRAVENOUS
  Filled 2021-03-15: qty 50

## 2021-03-15 MED ORDER — DEXTROSE 50 % IV SOLN: 1.0000 | Freq: Once | INTRAVENOUS | Status: AC

## 2021-03-15 MED ORDER — SODIUM BICARBONATE 8.4 % IV SOLN
INTRAVENOUS | Status: AC
  Filled 2021-03-15: qty 50

## 2021-03-15 MED ORDER — PIPERACILLIN-TAZOBACTAM 3.375 G IVPB 30 MIN: 3.3750 g | Freq: Once | INTRAVENOUS | Status: DC

## 2021-03-15 MED ORDER — PIPERACILLIN-TAZOBACTAM 3.375 G IVPB
3.3750 g | Freq: Three times a day (TID) | INTRAVENOUS | Status: DC
  Administered 2021-03-15 – 2021-03-18 (×9): 3.375 g via INTRAVENOUS
  Filled 2021-03-15 (×10): qty 50

## 2021-03-15 MED ORDER — DEXTROSE IN LACTATED RINGERS 5 % IV SOLN: INTRAVENOUS | Status: DC

## 2021-03-15 MED ORDER — NOREPINEPHRINE 4 MG/250ML-% IV SOLN: 2.0000 ug/min | INTRAVENOUS | Status: DC

## 2021-03-15 MED ORDER — HEPARIN SODIUM (PORCINE) 5000 UNIT/ML IJ SOLN: 5000.0000 [IU] | Freq: Three times a day (TID) | INTRAMUSCULAR | Status: DC

## 2021-03-15 MED ORDER — SODIUM CHLORIDE 0.9 % IV BOLUS
500.0000 mL | Freq: Once | INTRAVENOUS | Status: AC
  Administered 2021-03-15: 500 mL via INTRAVENOUS

## 2021-03-15 MED ORDER — LACTATED RINGERS IV SOLN: INTRAVENOUS | Status: DC

## 2021-03-15 MED ORDER — THIAMINE HCL 100 MG/ML IJ SOLN
100.0000 mg | Freq: Every day | INTRAMUSCULAR | Status: DC
  Administered 2021-03-15 – 2021-03-17 (×3): 100 mg via INTRAVENOUS
  Filled 2021-03-15 (×3): qty 2

## 2021-03-15 MED ORDER — PANTOPRAZOLE SODIUM 40 MG IV SOLR
40.0000 mg | Freq: Every day | INTRAVENOUS | Status: DC
  Administered 2021-03-15 – 2021-03-17 (×3): 40 mg via INTRAVENOUS
  Filled 2021-03-15 (×3): qty 40

## 2021-03-15 MED ORDER — LACTATED RINGERS IV BOLUS
1000.0000 mL | Freq: Once | INTRAVENOUS | Status: AC
  Administered 2021-03-15: 1000 mL via INTRAVENOUS

## 2021-03-15 MED ORDER — DOCUSATE SODIUM 100 MG PO CAPS: 100.0000 mg | ORAL_CAPSULE | Freq: Two times a day (BID) | ORAL | Status: DC | PRN

## 2021-03-15 MED ORDER — POLYETHYLENE GLYCOL 3350 17 G PO PACK: 17.0000 g | PACK | Freq: Every day | ORAL | Status: DC | PRN

## 2021-03-15 MED ORDER — PIPERACILLIN-TAZOBACTAM IN DEX 2-0.25 GM/50ML IV SOLN: 2.2500 g | Freq: Once | INTRAVENOUS | Status: DC

## 2021-03-15 MED ORDER — VANCOMYCIN HCL 10 G IV SOLR
2500.0000 mg | Freq: Once | INTRAVENOUS | Status: AC
  Administered 2021-03-15: 2500 mg via INTRAVENOUS
  Filled 2021-03-15: qty 2500

## 2021-03-15 MED ORDER — DEXTROSE 50 % IV SOLN
INTRAVENOUS | Status: AC
  Administered 2021-03-15: 50 mL via INTRAVENOUS
  Filled 2021-03-15: qty 50

## 2021-03-15 NOTE — H&P (Signed)
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  Additional pertinent PMH: Heart failure with reduced EF 20 to 25% diastolic heart failure cor pulmonale coronary artery disease of the most striking.  Unable to get much history currently.  He presented from the skilled nursing facility this morning on 5/20 with report of shortness of breath/difficulty breathing.  On arrival to the emergency room he was found to have tachypneic labored respiratory pattern, sensorium altered, with slurred speech, his speech was incomprehensible.  He was also found to be tachycardic, his left lower extremity stump was red, some bloody oozing from staple sites, fluctuating fluid palpable.Initial lab work showed mild leukocytosis, hemoglobin 11.2 up from 9.2 he had new kidney failure, and a lactic acidosis of greater than 11.  Culture data was sent, he was urgently started on vancomycin and Zosyn, fluid bolus initiated.  He received approximately 1-1/2 L of crystalloid and still maintained lactate greater than 11, mental status remained altered and because of this the critical care team was asked to admit  Pertinent  Medical History  Just discharged 5/13 after Left BKA for osteomyelitis w/ non/healing LE wound DO NOT RESUSCITATE status prior to arrival Chronic foley (POA) CAF HFrEF (20-25%)  Diastolic HF Cor Pulmonale  CAD Diabetes w/ complications including non-healing ulcers Esophageal stricture requiring dilation  Recent aspiration PNA  Chronic anemia  Depression and anxiety  headaches   HTN and hypotension (home meds including midodrine currently) HL SIADH (remote) OSA Stroke Prior tobacco abuse Prior polysubstance abuse  Significant Hospital Events: Including procedures, antibiotic start and stop dates in addition to other pertinent events   . 5/20 admitted with septic shock, with lactate greater than 11 and new renal failure.  Suspected source left BKA stump, however urine also dirty.  Culture sent.  30 mL/kg PBW crystalloid administered vancomycin and Zosyn started, Ortho consulted  Interim History / Subjective:  Complains of nausea  Objective   Blood pressure 124/85, pulse (Abnormal) 115, temperature 98.9 F (37.2 C), temperature source Rectal, resp. rate (Abnormal) 26, height 6\' 3"  (1.905 m), weight 107 kg, SpO2 100 %.        Intake/Output Summary (Last 24 hours) at 03/15/2021 0913 Last data filed at 03/15/2021 03/17/2021 Gross per 24 hour  Intake 600 ml  Output no documentation  Net 600 ml   Filed Weights   03/15/21 0423  Weight: 107 kg    Examination: General acutely ill appearing 60 year old male. His resp pattern is labored HENT: MM dry neck veins flat Sclera not icteric  Lungs: clear tachypneic, mild accessory use Cardiovascular: Tachycardic rhythm no murmur rub or gallop Abdomen: Soft not tender hypoactive Extremities: Right lower extremity swollen, demonstrates chronic venous and arterial changes, cool to palp Neuro: Awakens to voice, speech slurred, oriented x1-2, moves all extremities GU: Foley catheter in place concentrated yellow urine.  Labs/imaging that I havepersonally reviewed  (right click and "Reselect all SmartList Selections" daily)  See below  Resolved Hospital Problem list     Assessment & Plan:  Severe sepsis with septic shock, and lactic acidosis (poa), also element of hypovolemic shock  as well.  Most likely infectious source being left BKA stump, however cannot exclude urinary tract infection in the context of chronic Foley.   Although the degree of lactic acidosis would be more consistent with surgical site infection Plan Completing 30 mL/kg predicted body weight fluid resuscitation, followed by maintenance IV fluids Blood cultures have been sent Day #1 vancomycin and Zosyn Admit to intensive care Currently normotensive, but would initiate peripheral pressors if needed to maintain mean arterial pressure greater than 65 Holding Lopressor, resume midodrine when able to take p.o.'s Orthopedic surgery has been consulted to evaluate wound by emergency room team Check cortisol  Severe lactic acidosis, with anion gap metabolic acidosis slow to clear.  He does have history of severe cardiomyopathy, and cor pulmonale,He may have slow clearance of lactate secondary to hepatic congestion Plan Treat shock Trend lactates Hold metformin  Fluid electrolyte imbalance: Hyperkalemia (acute), hyponatremia (chronic) Plan Treat shock Serial chemistries  Acute metabolic encephalopathy secondary to sepsis (POA), has remote history of stroke Plan Supportive care Holding narcotic  HFrEF (EF 20 to 25%) also history of diastolic dysfunction, cor pulmonale, and global hypokinesis with history of coronary artery disease -Currently does not appear to be in heart failure Plan Holding Lopressor given shock state Telemetry monitoring  Chronic atrial fibrillation on anticoagulation Plan Holding Lopressor Currently n.p.o., can resume his Xarelto when able to take p.o., if remains unsafe to swallow for next 24 hours we will need to consider systemic anticoagulation with heparin  Acute kidney injury secondary to shock (present on admission).  Has chronic Foley present on admission Plan Aggressive hydration Holding antihypertensives Serial chemistries Strict intake output Renal ultrasound Change Foley on arrival to the ICU  Nausea with mild transaminitis present on admission Plan Trend LFTs likely secondary to shock Will send  lipase  Diabetes with multiple complications Currently hypoglycemic Plan Serial glucoses Blood glucose goal 140-180  Chronic anemia without evidence of bleeding currently.  CBC actually suggests hemoconcentration Plan Trending CBC    Best practice (right click and "Reselect all SmartList Selections" daily)  Diet:  NPO Pain/Anxiety/Delirium protocol (if indicated): No VAP protocol (if indicated): Not indicated DVT prophylaxis: Subcutaneous Heparin GI prophylaxis: PPI Glucose control:  SSI Yes Central venous access:  N/A Arterial line:  N/A Foley:  Yes, and it is still needed Mobility:  bed rest  PT consulted: N/A Last date of multidisciplinary goals of care discussion [by palliative] Code Status:  DNR this was present on arrival  Disposition: to ICU   Labs   CBC: Recent Labs  Lab 03/09/21 0226 03/09/21 2336 03/15/21 0557 03/15/21 0828  WBC 12.2* 11.8* 11.9*  --   NEUTROABS 8.5* 8.5* 9.5*  --   HGB 9.5* 9.9* 11.2* 6.8*  HCT 29.3* 32.4* 38.0* 20.0*  MCV 85.9 90.8 93.8  --   PLT 346 384 409*  --     Basic Metabolic Panel: Recent Labs  Lab 03/09/21 0226 03/09/21 2336 03/15/21 0557 03/15/21 0730 03/15/21 0828  NA 132* 132* 132*  --  144  K 3.9 4.7 5.4*  --  3.0*  CL 98 98 96*  --   --   CO2 25 21* 12*  --   --   GLUCOSE 132* 138* 52*  --   --   BUN 29* 31* 28*  --   --   CREATININE 1.08 1.04 1.61*  --   --   CALCIUM 8.4* 8.4* 8.9  --   --   MG 1.8  --   --  2.1  --    GFR: Estimated Creatinine Clearance: 65.3 mL/min (A) (by C-G formula based on SCr of 1.61 mg/dL (H)). Recent Labs  Lab 03/09/21 0226 03/09/21 2336 03/15/21 0557 03/15/21 0730  PROCALCITON 0.16  --   --   --   WBC 12.2* 11.8* 11.9*  --   LATICACIDVEN  --   --  >11.0* >11.0*    Liver Function Tests: Recent Labs  Lab 03/09/21 0226 03/15/21 0557  AST 68* 45*  ALT 50* 49*  ALKPHOS 86 97  BILITOT 1.6* 3.6*  PROT 6.1* 7.2  ALBUMIN 2.4* 2.7*   No results for input(s): LIPASE,  AMYLASE in the last 168 hours. No results for input(s): AMMONIA in the last 168 hours.  ABG    Component Value Date/Time   HCO3 5.9 (L) 03/15/2021 0828   TCO2 6 (L) 03/15/2021 0828   ACIDBASEDEF 21.0 (H) 03/15/2021 0828   O2SAT 73.0 03/15/2021 0828     Coagulation Profile: No results for input(s): INR, PROTIME in the last 168 hours.  Cardiac Enzymes: Recent Labs  Lab 03/15/21 0730  CKTOTAL 113    HbA1C: Hgb A1c MFr Bld  Date/Time Value Ref Range Status  02/18/2021 09:40 AM 7.7 (H) 4.8 - 5.6 % Final    Comment:    (NOTE) Pre diabetes:          5.7%-6.4%  Diabetes:              >6.4%  Glycemic control for   <7.0% adults with diabetes   02/16/2021 06:22 AM 7.8 (H) 4.8 - 5.6 % Final    Comment:    (NOTE) Pre diabetes:          5.7%-6.4%  Diabetes:              >6.4%  Glycemic control for   <7.0% adults with diabetes     CBG: Recent Labs  Lab 03/11/21 0140 03/11/21 0810 03/11/21 1249 03/11/21 1814 03/15/21 0758  GLUCAP 158* 139* 149* 135* 98    Review of Systems:   Not able   Past Medical History:  He,  has a past medical history of Acute combined systolic and diastolic CHF, NYHA class 1 (HCC) (08/07/2016), Anemia, Atrial fibrillation (HCC), BACK PAIN (02/11/2010), CAD (coronary artery disease) (02/11/2010), Cellulitis (09/29/2017), Cerebrovascular disease, CHEST PAIN (02/11/2010), Chronic anticoagulation (03/25/2018), Chronic pain syndrome, Chronic systolic congestive heart failure (HCC) (03/25/2018), Community acquired pneumonia (08/11/2016), DEPRESSION/ANXIETY (02/11/2010), Diabetes mellitus, type 2 (HCC) (02/11/2010), Diabetic ulcer of toe of right foot associated with type 2 diabetes mellitus (HCC) (10/01/2017), ESOPHAGEAL STRICTURE (02/11/2010), GERD (gastroesophageal reflux disease), HEADACHE (02/11/2010), History of CVA (cerebrovascular accident) (09/29/2017), History of drug abuse (HCC) (08/05/2016), Hypercholesteremia, Hyperlipemia (02/11/2010), Hyperlipidemia,  Hypertension (02/11/2010), HYPERTENSION, UNSPECIFIED (02/11/2010), Hypogonadism male, 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   Surgical History:   Past Surgical History:  Procedure Laterality Date  . AMPUTATION Left 02/20/2021   Procedure: LEFT TRANSMETATARSAL AMPUTATION;  Surgeon: [name], MD;  Location: [facility];  Service: Orthopedics;  Laterality: Left;  . AMPUTATION Left 03/06/2021   Procedure: LEFT BELOW KNEE AMPUTATION;  Surgeon: [name], MD;  Location: [facility] OR;  Service: Orthopedics;  Laterality: Left;  .  NO PAST SURGERIES       Social History:   reports that he has quit smoking. His smoking use included cigarettes. He started smoking about 4 years ago. He quit smokeless tobacco use about 5 years ago.   Family History:  His family history includes Hypertension in his father and mother.   Allergies Allergies  Allergen Reactions  . Codeine Itching  . Bee Venom Other (See Comments)    "Feels like his head is on fire when he goes outside in the sun"  . Hydrocodone-Acetaminophen Other (See Comments)  . Other Other (See Comments)    "Feels like his head is on fire when he goes outside in the sun"  . Cephalexin     No mention of this allergy in notes from 07/2016 admission for osteomyelitis.  Patient was treated with Ancef and Unasyn for broader anaerobic coverage.  Patient is not aware of allergy.  . Vicodin [Hydrocodone-Acetaminophen] Itching  . Zocor [Simvastatin] Other (See Comments)    Myalgias     Home Medications  Prior to  Admission medications   Medication Sig Start Date End Date Taking? Authorizing Provider  acetaminophen (TYLENOL) 325 MG tablet Take 2 tablets (650 mg total) by mouth every 6 (six) hours as needed for mild pain (or Fever >/= 101). 03/08/21   Leroy Sea, MD  albuterol (PROVENTIL) (2.5 MG/3ML) 0.083% nebulizer solution Inhale 3 mLs into the lungs every 4 (four) hours as needed for wheezing or shortness of breath. 03/08/21   Leroy Sea, MD  folic acid (FOLVITE) 1 MG tablet Take 1 mg by mouth daily.    [provider]  metFORMIN (GLUCOPHAGE) 500 MG tablet Take 500 mg by mouth 2 (two) times daily. 02/04/21   [provider]  metoprolol succinate (TOPROL-XL) 25 MG 24 hr tablet Take 1 tablet (25 mg total) by mouth daily. 03/09/21   Leroy Sea, MD  midodrine (PROAMATINE) 5 MG tablet Take 1 tablet (5 mg total) by mouth 3 (three) times daily with meals. 03/09/21   Leroy Sea, MD  oxyCODONE-acetaminophen (PERCOCET/ROXICET) 5-325 MG tablet Take 1 tablet by mouth every 8 (eight) hours as needed for severe pain. 03/08/21   Leroy Sea, MD  pantoprazole (PROTONIX) 40 MG tablet Take 1 tablet (40 mg total) by mouth daily. 03/08/21   Leroy Sea, MD  rivaroxaban (XARELTO) 20 MG TABS tablet Take 20 mg by mouth daily with supper.    [provider]     Critical care time: 55 minutes     Simonne Martinet ACNP-BC [facility] Pulmonary/Critical Care Pager # [phone] OR # [phone] if no answer

## 2021-03-15 NOTE — Progress Notes (Signed)
Pharmacy Antibiotic Note  SAVEON PLANT is a 60 y.o. male admitted on 03/15/2021 with sepsis.  Pharmacy has been consulted for vancomycin dosing. Also with Zosyn ordered x 1 dose in the ED. SCr 1.61 (recent baseline SCr ~1-1.3), LA >11 on admit. Noted recent L BKA.  Plan: Zosyn 3.375g IV ( infusion) x 1 dose - f/u if to continue Vancomycin 2500mg  IV x 1; then 1g IV q12h. Goal AUC 400-550. Expected AUC: 486 SCr used: 1.61 Monitor clinical progress, c/s, renal function F/u de-escalation plan/LOT, vancomycin levels as indicated   Height: 6\' 3"  (190.5 cm) Weight: 107 kg (235 lb 14.3 oz) IBW/kg (Calculated) : 84.5  Temp (24hrs), Avg:98.2 F (36.8 C), Min:97.5 F (36.4 C), Max:98.9 F (37.2 C)  Recent Labs  Lab 03/09/21 0226 03/09/21 2336 03/15/21 0557  WBC 12.2* 11.8* 11.9*  CREATININE 1.08 1.04 1.61*  LATICACIDVEN  --   --  >11.0*    Estimated Creatinine Clearance: 65.3 mL/min (A) (by C-G formula based on SCr of 1.61 mg/dL (H)).    Allergies  Allergen Reactions  . Codeine Itching  . Bee Venom Other (See Comments)    "Feels like his head is on fire when he goes outside in the sun"  . Hydrocodone-Acetaminophen Other (See Comments)  . Other Other (See Comments)    "Feels like his head is on fire when he goes outside in the sun"  . Cephalexin     No mention of this allergy in notes from 07/2016 admission for osteomyelitis.  Patient was treated with Ancef and Unasyn for broader anaerobic coverage.  Patient is not aware of allergy.  . Vicodin [Hydrocodone-Acetaminophen] Itching  . Zocor [Simvastatin] Other (See Comments)    Myalgias    03/17/21, PharmD, BCPS Please check AMION for all [facility] contact numbers Clinical Pharmacist 03/15/2021 7:45 AM

## 2021-03-15 NOTE — ED Provider Notes (Signed)
[facility] EMERGENCY DEPARTMENT Provider Note   CSN: [id number] Arrival date & time: 03/15/21  0356     History Chief Complaint  Patient presents with  . Shortness of Breath    Fernando Maddox is a 60 y.o. male.  Patient with history of CAD, atrial fibrillation, sdCHF, recent left BKA, HTN, DM, SIADH, HLD, CVA presents by EMS from [facility] for evaluation of "breathing difficulties." Per chart review, patient admitted 4/21 for septic shock, underwent left transmetatarsal amputation with subsequent left BKA on 5/11 secondary to complications of healing/bleeding. During this admission he developed aspiration PNA. He was discharged to [facility] on 5/13. He returned to the ED seeking alternative placement and was ultimately accepted to [facility] on 5/16.   The history is provided by the EMS personnel. No language interpreter was used.  Shortness of Breath      Past Medical History:  Diagnosis Date  . Acute combined systolic and diastolic CHF, NYHA class 1 (HCC) 08/07/2016  . Anemia   . Atrial fibrillation (HCC)   . BACK PAIN 02/11/2010   Qualifier: Diagnosis of  By: Manson Passey, RN, BSN, Lauren    . CAD (coronary artery disease) 02/11/2010   Qualifier: Diagnosis of  By: Manson Passey, RN, BSN, Lauren    . Cellulitis 09/29/2017  . Cerebrovascular disease   . CHEST PAIN 02/11/2010   Qualifier: Diagnosis of  By: Manson Passey, RN, BSN, Lauren    . Chronic anticoagulation 03/25/2018  . Chronic pain syndrome   . Chronic systolic congestive heart failure (HCC) 03/25/2018  . Community acquired pneumonia 08/11/2016  . DEPRESSION/ANXIETY 02/11/2010   Qualifier: Diagnosis of  By: Manson Passey, RN, BSN, Lauren    . Diabetes mellitus, type 2 (HCC) 02/11/2010   Qualifier: Diagnosis of  By: Manson Passey, RN, BSN, Lauren    . Diabetic ulcer of toe of right foot associated with type 2 diabetes mellitus (HCC) 10/01/2017  . ESOPHAGEAL STRICTURE 02/11/2010   Qualifier: Diagnosis of  By: Manson Passey, RN, BSN,  Lauren    . GERD (gastroesophageal reflux disease)   . HEADACHE 02/11/2010   Qualifier: Diagnosis of  By: Manson Passey, RN, BSN, Lauren    . History of CVA (cerebrovascular accident) 09/29/2017  . History of drug abuse (HCC) 08/05/2016  . Hypercholesteremia   . Hyperlipemia 02/11/2010   Qualifier: Diagnosis of  By: Manson Passey, RN, BSN, Lauren    . Hyperlipidemia   . Hypertension 02/11/2010   Qualifier: Diagnosis of  By: Manson Passey, RN, BSN, Lauren    . HYPERTENSION, UNSPECIFIED 02/11/2010   Qualifier: Diagnosis of  By: Manson Passey, RN, BSN, Lauren    . Hypogonadism male   . 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 Status post thrombectomy  . Osteoarthritis 08/05/2016  . Osteomyelitis, unspecified (HCC) 10/06/2017  . PAF (paroxysmal atrial fibrillation) (HCC) 08/05/2016  . Polysubstance abuse (HCC) 09/29/2017  . Rotator cuff syndrome    Shoulder  . Septic shock (HCC) 02/14/2021  . Shoulder pain   . 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 Sleep apnea   . Stroke (HCC) 08/05/2016  . Tobacco use 08/11/2016  . Ulcer of left foot with necrosis of bone [facility])     Patient Active Problem List   Diagnosis Date Noted  . Dehiscence of amputation stump (HCC)   . Foot ulcer (HCC)   . Acute combined systolic and diastolic congestive heart failure (HCC)   . Hyponatremia   .  Ulcer of left foot with necrosis of bone (HCC)   . Septic shock (HCC) 02/14/2021  . Atrial fibrillation (HCC)   . Cerebrovascular disease   . Chronic pain syndrome   . Hyperlipidemia   . Shoulder pain   . Anemia   . GERD (gastroesophageal reflux disease)   . Hypercholesteremia   . Hypogonadism male   . Rotator cuff syndrome   . Sleep apnea   . Chronic anticoagulation 03/25/2018  . Chronic systolic congestive heart failure (HCC) 03/25/2018  . Osteomyelitis, unspecified (HCC) 10/06/2017  . Diabetic ulcer of toe of right foot associated  with type 2 diabetes mellitus (HCC) 10/01/2017  . Cellulitis 09/29/2017  . History of CVA (cerebrovascular accident) 09/29/2017  . Polysubstance abuse (HCC) 09/29/2017  . 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 Community acquired pneumonia 08/11/2016  . Tobacco use 08/11/2016  . Acute combined systolic and diastolic CHF, NYHA class 1 (HCC) 08/07/2016  . 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 History of drug abuse (HCC) 08/05/2016  . Osteoarthritis 08/05/2016  . PAF (paroxysmal atrial fibrillation) (HCC) 08/05/2016  . Stroke (HCC) 08/05/2016  . Diabetes mellitus, type 2 (HCC) 02/11/2010  . Hyperlipemia 02/11/2010  . HYPERTENSION, UNSPECIFIED 02/11/2010  . CAD (coronary artery disease) 02/11/2010  . Hypertension 02/11/2010  . ESOPHAGEAL STRICTURE 02/11/2010  . BACK PAIN 02/11/2010  . HEADACHE 02/11/2010  . CHEST PAIN 02/11/2010  . DEPRESSION/ANXIETY 02/11/2010    Past Surgical History:  Procedure Laterality Date  . AMPUTATION Left 02/20/2021   Procedure: LEFT TRANSMETATARSAL AMPUTATION;  Surgeon: [name], MD;  Location[facility] OR;  Service: Orthopedics;  Laterality: Left;  . AMPUTATION Left 03/06/2021   Procedure: LEFT BELOW KNEE AMPUTATION;  Surgeon: [name], MD;  Location: [facility] OR;  Service: Orthopedics;  Laterality: Left;  . NO PAST SURGERIES         Family History  Problem Relation Age of Onset  . Hypertension Mother   . Hypertension Father     Social History   Tobacco Use  . Smoking status: Former Smoker    Types: Cigarettes    Start date: 11/06/2016  . Smokeless tobacco: Former Neurosurgeon    Quit date: 03/06/2016    Home Medications Prior to Admission medications   Medication Sig Start Date End Date Taking? Authorizing Provider  acetaminophen (TYLENOL) 325 MG tablet Take 2 tablets (650 mg total) by mouth every 6 (six) hours as needed for mild pain (or Fever >/= 101). 03/08/21   Leroy Sea, MD  albuterol  (PROVENTIL) (2.5 MG/3ML) 0.083% nebulizer solution Inhale 3 mLs into the lungs every 4 (four) hours as needed for wheezing or shortness of breath. 03/08/21   Leroy Sea, MD  folic acid (FOLVITE) 1 MG tablet Take 1 mg by mouth daily.    [provider]  metFORMIN (GLUCOPHAGE) 500 MG tablet Take 500 mg by mouth 2 (two) times daily. 02/04/21   [provider]  metoprolol succinate (TOPROL-XL) 25 MG 24 hr tablet Take 1 tablet (25 mg total) by mouth daily. 03/09/21   Leroy Sea, MD  midodrine (PROAMATINE) 5 MG tablet Take 1 tablet (5 mg total) by mouth 3 (three) times daily with meals. 03/09/21   Leroy Sea, MD  oxyCODONE-acetaminophen (PERCOCET/ROXICET) 5-325 MG tablet Take 1 tablet by mouth every 8 (eight) hours as needed for severe pain. 03/08/21   Leroy Sea, MD  pantoprazole (PROTONIX) 40 MG tablet Take 1 tablet (40 mg  total) by mouth daily. 03/08/21   Leroy Sea, MD  rivaroxaban (XARELTO) 20 MG TABS tablet Take 20 mg by mouth daily with supper.    [provider]    Allergies    Codeine, Bee venom, Hydrocodone-acetaminophen, Other, Cephalexin, Vicodin [hydrocodone-acetaminophen], and Zocor [simvastatin]  Review of Systems   Review of Systems  Unable to perform ROS: Mental status change  Respiratory: Positive for shortness of breath.     Physical Exam Updated Vital Signs BP (!) 124/98   Pulse (!) 118   Temp (!) 97.5 F (36.4 C) (Oral)   Resp (!) 26   Ht 6\' 3"  (1.905 m)   Wt 107 kg   SpO2 100%   BMI 29.48 kg/m   Physical Exam Vitals and nursing note reviewed.  Constitutional:      Appearance: He is well-developed.     Comments: Somnolent, incoherent speech  HENT:     Head: Normocephalic and atraumatic.  Cardiovascular:     Rate and Rhythm: Regular rhythm. Tachycardia present.  Pulmonary:     Effort: Pulmonary effort is normal. Tachypnea present.     Breath sounds: Examination of the right-lower field reveals rales.  Examination of the left-lower field reveals rales. Rales present. No wheezing.  Abdominal:     Palpations: Abdomen is soft.     Tenderness: There is no abdominal tenderness. There is no guarding or rebound.  Musculoskeletal:        General: Normal range of motion.     Cervical back: Normal range of motion and neck supple.     Comments: Left BKA in brace. No erythema above bandage. No swelling.   Skin:    General: Skin is warm and dry.  Neurological:     Comments: Patient responds to verbal stimuli but does not seem to wake fully. Speech is incomprehensible. He makes eye contact briefly before closing his eyes again and mumbles. He does not follow command.      ED Results / Procedures / Treatments   Labs (all labs ordered are listed, but only abnormal results are displayed) Labs Reviewed  CULTURE, BLOOD (ROUTINE X 2)  CULTURE, BLOOD (ROUTINE X 2)  URINE CULTURE  CBC WITH DIFFERENTIAL/PLATELET  LACTIC ACID, PLASMA  LACTIC ACID, PLASMA  COMPREHENSIVE METABOLIC PANEL  URINALYSIS, ROUTINE W REFLEX MICROSCOPIC  TROPONIN I (HIGH SENSITIVITY)    EKG EKG Interpretation  Date/Time:  Friday Mar 15 2021 04:19:16 EDT Ventricular Rate:  110 PR Interval:    QRS Duration: 125 QT Interval:  370 QTC Calculation: 501 R Axis:   224 Text Interpretation: Atrial fibrillation Ventricular premature complex IVCD, consider atypical RBBB Anterior infarct, old Low voltage QRS When compared with ECG of 02/14/2021, Premature ventricular complexes are no longer present Confirmed by Dione Booze ([id number]) on 03/15/2021 4:47:17 AM   Radiology No results found.  Procedures Procedures  CRITICAL CARE Performed by: Arnoldo Hooker   Total critical care time: 40 minutes  Critical care time was exclusive of separately billable procedures and treating other patients.  Critical care was necessary to treat or prevent imminent or life-threatening deterioration.  Critical care was time spent personally by  me on the following activities: development of treatment plan with patient and/or surrogate as well as nursing, discussions with consultants, evaluation of patient's response to treatment, examination of patient, obtaining history from patient or surrogate, ordering and performing treatments and interventions, ordering and review of laboratory studies, ordering and review of radiographic studies, pulse oximetry and re-evaluation of  patient's condition.  Medications Ordered in ED Medications  sodium chloride 0.9 % bolus 500 mL (has no administration in time range)    ED Course  I have reviewed the triage vital signs and the nursing notes.  Pertinent labs & imaging results that were available during my care of the patient were reviewed by me and considered in my medical decision making (see chart for details).    MDM Rules/Calculators/A&P                          Patient to ED from [facility] for evaluation of abnormal breathing. There is no other available history.  The patient is tachypneic, tachycardic. He wakes but is altered/somnolent, speech is garbled. Labs, imaging pending.   Glucose resulted as 52. Amp D50 ordered. Lactic acid >11. CO2 12, gap of 24. Elevated t bili of 3.6, significantly elevated over last result of 1.6 six days ago. IV fluids ordered (1 liter).   Patient care signed out at end of shift to Dr. Louretta Parma, resident, and Dr. Newman Pies, ER attending.  Final Clinical Impression(s) / ED Diagnoses Final diagnoses:  None   1. AMS 2. Lactic acidosis 3. Hypoglycemia  Rx / DC Orders ED Discharge Orders    None       Danne Harbor 03/15/21 [id number]    Dione Booze, MD 03/15/21 [id number]

## 2021-03-15 NOTE — Progress Notes (Signed)
Left upper extremity venous duplex has been completed. Preliminary results can be found in CV Proc through chart review.   03/15/21 3:33 PM Olen Cordial RVT

## 2021-03-15 NOTE — Procedures (Signed)
Central Venous Catheter Insertion Procedure Note  MACAULEY MOSSBERG  [id number]  1961/06/17  Date:03/15/21  Time:4:04 PM   Provider Performing:Shreyan Hinz Carmon Ginsberg Earlene Plater   Procedure: Insertion of Non-tunneled Central Venous [phone]) with US guidance ([id number])     Indication(s) Medication administration and Difficult access  Consent Risks of the procedure as well as the alternatives and risks of each were explained to the patient and/or caregiver.  Consent for the procedure was obtained and is signed in the bedside chart  Anesthesia Topical only with 1% lidocaine   Timeout Verified patient identification, verified procedure, site/side was marked, verified correct patient position, special equipment/implants available, medications/allergies/relevant history reviewed, required imaging and test results available.  Sterile Technique Maximal sterile technique including full sterile barrier drape, hand hygiene, sterile gown, sterile gloves, mask, hair covering, sterile ultrasound probe cover (if used).  Procedure Description Area of catheter insertion was cleaned with chlorhexidine and draped in sterile fashion.  With real-time ultrasound guidance a central venous catheter was placed into the left internal jugular vein. Nonpulsatile blood flow and easy flushing noted in all ports.  The catheter was sutured in place and sterile dressing applied.  Complications/Tolerance None; patient tolerated the procedure well. Chest X-ray is ordered to verify placement for internal jugular or subclavian cannulation.   Chest x-ray is not ordered for femoral cannulation.  EBL Minimal  Specimen(s) None   Delfin Gant, NP-C [facility] Personal contact information can be found on Amion  03/15/2021, 4:04 PM

## 2021-03-15 NOTE — Progress Notes (Signed)
istat hgb 6.8. doubt that this is real. Will ck his cbc STAT and hold heparin for now.   Simonne Martinet ACNP-BC [facility] Pulmonary/Critical Care Pager # [phone] OR # [phone] if no answer

## 2021-03-15 NOTE — ED Triage Notes (Addendum)
Pt bib [facility] from [name] for breathing problems. Staff said he has been breathing this way for 24-48 hours. 100% on room air. Pt presents with shob, cough and complaints of chronic generalized body pain. Pt states that left arm swelling is normal

## 2021-03-15 NOTE — ED Notes (Signed)
Preston Fleeting, MD and [place], [place] notified of lactic >11

## 2021-03-15 NOTE — ED Provider Notes (Signed)
60 year old male with history of recent BKA presents emergency department for shortness of breath Is altered upon arrival, poor historian Living facility is also unable to contribute good history Discharged 4 days ago, had the ability to interact, so this is a significant change from mental status baseline Lactate is 11, no obvious infectious   Plan at time of handoff is to follow-up imaging and labs, patient will need admission Possibly ICU   Physical Exam  BP 108/86   Pulse 86   Temp 97.6 F (36.4 C) (Axillary)   Resp 17   Ht 6\' 3"  (1.905 m)   Wt 107 kg   SpO2 100%   BMI 29.48 kg/m   Physical Exam Patient is currently sleeping with sonorous breathing, does appear to be protecting his airway at this time.  Significantly altered. Some grimacing with abdominal palpation. Sided BKA is mildly erythematous and warm without induration or pustular discharge    EMERGENCY DEPARTMENT CARDIAC EXAM "Study: Limited Ultrasound of the Heart and Pericardium"  INDICATIONS:Dyspnea Multiple views of the heart and pericardium were obtained in real-time with a multi-frequency probe.  PERFORMED US IMAGES ARCHIVED?: Yes LIMITATIONS:  None VIEWS USED: Subcostal 4 chamber, Parasternal long axis, Parasternal short axis, Apical 4 chamber  and Inferior Vena Cava INTERPRETATION: Cardiac activity present, Pericardial effusioin absent, Decreased contractility and IVC dilated   MDM  Upon evaluation, patient is very ill-appearing.  Code sepsis initiated due to elevated lactate, tachycardia and tachypnea.  CBC does show leukocytosis.  Repeat lactate is also greater than 11.  No UTI on UA pending.  27, mildly elevated.  BHB not elevated.  Ammonia normal.  Dyspnea hyperbilirubinemia is present without significant elevation of liver enzymes from baseline.  Exam normal.  COVID negative.  Blood glucose on BMP in the 50s.  D50 given.  AKI present.  Chest x-ray with no acute findings.  CT head with  no acute findings.  X-ray of left lower extremity with no acute findings.  CT abdomen and pelvis also with no acute findings.   1 L fluid bolus given.  Due to dilated IVC on POCUS echo, did hold on 30 cc/KG fluid resuscitation.  I spoke with patient's mother on the phone, Rama.  She confirmed that patient would not want CPR or intubation should he significantly decompensate, this is in agreement with his portable DNR paperwork brought to the hospital.  Orthopedic surgery notified of patient being here.  They evaluated BKA site at bedside, no concerns for infection on their exam.  Due to poor mental status and high likelihood of decompensation with severe metabolic acidosis, patient admitted to the ICU for close monitoring and treatment.     GB:EEFEOF, DO 03/15/21 1535    03/17/21, MD 03/20/21 [phone]

## 2021-03-15 NOTE — Progress Notes (Signed)
Critical ABG results were given to Dr. Merrily Pew.

## 2021-03-15 NOTE — Sepsis Progress Note (Signed)
eLink is following this Code Sepsis. °

## 2021-03-15 NOTE — Consult Note (Signed)
Reason for Consult:s/p BKA Referring Physician: R Dykstra Time called: 0908 Time at bedside: 0925   Fernando Maddox is an 60 y.o. male.  HPI: Fernando Maddox was brought back to the ED with c/o SOB by the facility that he was in. He is obtunded and cannot contribute to history. He will wake up briefly and answer yes/no questions. He indicates no when asked if his leg is hurting more than usual. He meets criteria for sepsis and orthopedic surgery was asked to see him to r/o surgical infection.  Past Medical History:  Diagnosis Date  . Acute combined systolic and diastolic CHF, NYHA class 1 (HCC) 08/07/2016  . Anemia   . Atrial fibrillation (HCC)   . BACK PAIN 02/11/2010   Qualifier: Diagnosis of  By: Manson Passey, RN, BSN, Lauren    . CAD (coronary artery disease) 02/11/2010   Qualifier: Diagnosis of  By: Manson Passey, RN, BSN, Lauren    . Cellulitis 09/29/2017  . Cerebrovascular disease   . CHEST PAIN 02/11/2010   Qualifier: Diagnosis of  By: Manson Passey, RN, BSN, Lauren    . Chronic anticoagulation 03/25/2018  . Chronic pain syndrome   . Chronic systolic congestive heart failure (HCC) 03/25/2018  . Community acquired pneumonia 08/11/2016  . DEPRESSION/ANXIETY 02/11/2010   Qualifier: Diagnosis of  By: Manson Passey, RN, BSN, Lauren    . Diabetes mellitus, type 2 (HCC) 02/11/2010   Qualifier: Diagnosis of  By: Manson Passey, RN, BSN, Lauren    . Diabetic ulcer of toe of right foot associated with type 2 diabetes mellitus (HCC) 10/01/2017  . ESOPHAGEAL STRICTURE 02/11/2010   Qualifier: Diagnosis of  By: Manson Passey, RN, BSN, Lauren    . GERD (gastroesophageal reflux disease)   . HEADACHE 02/11/2010   Qualifier: Diagnosis of  By: Manson Passey, RN, BSN, Lauren    . History of CVA (cerebrovascular accident) 09/29/2017  . History of drug abuse (HCC) 08/05/2016  . Hypercholesteremia   . Hyperlipemia 02/11/2010   Qualifier: Diagnosis of  By: Manson Passey, RN, BSN, Lauren    . Hyperlipidemia   . Hypertension 02/11/2010   Qualifier: Diagnosis of  By: Manson Passey, RN,  BSN, Lauren    . HYPERTENSION, UNSPECIFIED 02/11/2010   Qualifier: Diagnosis of  By: Manson Passey, RN, BSN, Lauren    . Hypogonadism male   . 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 Status post thrombectomy  . Osteoarthritis 08/05/2016  . Osteomyelitis, unspecified (HCC) 10/06/2017  . PAF (paroxysmal atrial fibrillation) (HCC) 08/05/2016  . Polysubstance abuse (HCC) 09/29/2017  . Rotator cuff syndrome    Shoulder  . Septic shock (HCC) 02/14/2021  . Shoulder pain   . 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 Sleep apnea   . Stroke (HCC) 08/05/2016  . Tobacco use 08/11/2016  . Ulcer of left foot with necrosis of bone [facility] [place])     Past Surgical History:  Procedure Laterality Date  . AMPUTATION Left 02/20/2021   Procedure: LEFT TRANSMETATARSAL AMPUTATION;  Surgeon: [name], MD;  Location: [facility] OR;  Service: Orthopedics;  Laterality: Left;  . AMPUTATION Left 03/06/2021   Procedure: LEFT BELOW KNEE AMPUTATION;  Surgeon: [name], MD;  Location: [facility] OR;  Service: Orthopedics;  Laterality: Left;  . NO PAST SURGERIES      Family History  Problem Relation Age of Onset  . Hypertension Mother   . Hypertension Father     Social History:  reports that he has quit  smoking. His smoking use included cigarettes. He started smoking about 4 years ago. He quit smokeless tobacco use about 5 years ago. No history on file for alcohol use and drug use.  Allergies:  Allergies  Allergen Reactions  . Codeine Itching  . Bee Venom Other (See Comments)    "Feels like his head is on fire when he goes outside in the sun"  . Hydrocodone-Acetaminophen Other (See Comments)  . Other Other (See Comments)    "Feels like his head is on fire when he goes outside in the sun"  . Cephalexin     No mention of this allergy in notes from 07/2016 admission for osteomyelitis.  Patient was treated with Ancef and  Unasyn for broader anaerobic coverage.  Patient is not aware of allergy.  . Vicodin [Hydrocodone-Acetaminophen] Itching  . Zocor [Simvastatin] Other (See Comments)    Myalgias    Medications: I have reviewed the patient's current medications.  Results for orders placed or performed during the hospital encounter of 03/15/21 (from the past 48 hour(s))  Urinalysis, Routine w reflex microscopic Urine, Catheterized     Status: Abnormal   Collection Time: 03/15/21  5:38 AM  Result Value Ref Range   Color, Urine AMBER (A) YELLOW    Comment: BIOCHEMICALS MAY BE AFFECTED BY COLOR   APPearance TURBID (A) CLEAR   Specific Gravity, Urine >1.030 (H) 1.005 - 1.030   pH 5.0 5.0 - 8.0   Glucose, UA NEGATIVE NEGATIVE mg/dL   Hgb urine dipstick LARGE (A) NEGATIVE   Bilirubin Urine MODERATE (A) NEGATIVE   Ketones, ur NEGATIVE NEGATIVE mg/dL   Protein, ur >161>300 (A) NEGATIVE mg/dL   Nitrite NEGATIVE NEGATIVE   Leukocytes,Ua NEGATIVE NEGATIVE    Comment: Performed at [facility], [address] [place], [place] [address]  Urinalysis, Microscopic (reflex)     Status: Abnormal   Collection Time: 03/15/21  5:38 AM  Result Value Ref Range   RBC / HPF >50 0 - 5 RBC/hpf   WBC, UA 6-10 0 - 5 WBC/hpf   Bacteria, UA MANY (A) NONE SEEN   Squamous Epithelial / LPF 0-5 0 - 5   Non Squamous Epithelial PRESENT (A) NONE SEEN   Hyaline Casts, UA PRESENT    Granular Casts, UA PRESENT    Urine-Other LESS THAN 10 mL OF URINE SUBMITTED     Comment: MICROSCOPIC EXAM PERFORMED ON UNCONCENTRATED URINE Performed at [facility], [address] [place], [place] [address]   CBC with Differential     Status: Abnormal   Collection Time: 03/15/21  5:57 AM  Result Value Ref Range   WBC 11.9 (H) 4.0 - 10.5 K/uL   RBC 4.05 (L) 4.22 - 5.81 MIL/uL   Hemoglobin 11.2 (L) 13.0 - 17.0 g/dL   HCT 11.938.0 (L) 14.739.0 - 82.952.0 %   MCV 93.8 80.0 - 100.0 fL   MCH 27.7 26.0 - 34.0 pg   MCHC 29.5 (L) 30.0 - 36.0 g/dL   RDW 56.220.5  (H) 13.011.5 - 15.5 %   Platelets 409 (H) 150 - 400 K/uL   nRBC 0.2 0.0 - 0.2 %   Neutrophils Relative % 81 %   Neutro Abs 9.5 (H) 1.7 - 7.7 K/uL   Lymphocytes Relative 8 %   Lymphs Abs 1.0 0.7 - 4.0 K/uL   Monocytes Relative 10 %   Monocytes Absolute 1.2 (H) 0.1 - 1.0 K/uL   Eosinophils Relative 0 %   Eosinophils Absolute 0.0 0.0 -  0.5 K/uL   Basophils Relative 0 %   Basophils Absolute 0.0 0.0 - 0.1 K/uL   Immature Granulocytes 1 %   Abs Immature Granulocytes 0.11 (H) 0.00 - 0.07 K/uL    Comment: Performed at [facility], [address] [place], [place] [address]  Lactic acid, plasma     Status: Abnormal   Collection Time: 03/15/21  5:57 AM  Result Value Ref Range   Lactic Acid, Venous >11.0 (HH) 0.5 - 1.9 mmol/L    Comment: CRITICAL RESULT CALLED TO, READ BACK BY AND VERIFIED WITH: C. COBB [id number] 03/15/21 A. MCDOWELL Performed at [facility], [address] [place], [place] [address]   Comprehensive metabolic panel     Status: Abnormal   Collection Time: 03/15/21  5:57 AM  Result Value Ref Range   Sodium 132 (L) 135 - 145 mmol/L   Potassium 5.4 (H) 3.5 - 5.1 mmol/L   Chloride 96 (L) 98 - 111 mmol/L   CO2 12 (L) 22 - 32 mmol/L   Glucose, Bld 52 (L) 70 - 99 mg/dL    Comment: Glucose reference range applies only to samples taken after fasting for at least 8 hours.   BUN 28 (H) 6 - 20 mg/dL   Creatinine, Ser 9.45 (H) 0.61 - 1.24 mg/dL   Calcium 8.9 8.9 - 03.8 mg/dL   Total Protein 7.2 6.5 - 8.1 g/dL   Albumin 2.7 (L) 3.5 - 5.0 g/dL   AST 45 (H) 15 - 41 U/L   ALT 49 (H) 0 - 44 U/L    Comment: RESULTS CONFIRMED BY MANUAL DILUTION   Alkaline Phosphatase 97 38 - 126 U/L   Total Bilirubin 3.6 (H) 0.3 - 1.2 mg/dL   GFR, Estimated 49 (L) >60 mL/min    Comment: (NOTE) Calculated using the CKD-EPI Creatinine Equation (2021)    Anion gap 24 (H) 5 - 15    Comment: Performed at [facility], [address], [place], [place] [address]  Troponin I (High Sensitivity)      Status: Abnormal   Collection Time: 03/15/21  5:57 AM  Result Value Ref Range   Troponin I (High Sensitivity) 27 (H) <18 ng/L    Comment: (NOTE) Elevated high sensitivity troponin I (hsTnI) values and significant  changes across serial measurements may suggest ACS but many other  chronic and acute conditions are known to elevate hsTnI results.  Refer to the Links section for chest pain algorithms and additional  guidance. Performed at [facility], [address], [place], [place] [address]   CK     Status: None   Collection Time: 03/15/21  7:30 AM  Result Value Ref Range   Total CK 113 49 - 397 U/L    Comment: Performed at [facility], [address] [place], [place] [address]  Magnesium     Status: None   Collection Time: 03/15/21  7:30 AM  Result Value Ref Range   Magnesium 2.1 1.7 - 2.4 mg/dL    Comment: Performed at [facility], [address] [place], [place] [address]  Lactic acid, plasma     Status: Abnormal   Collection Time: 03/15/21  7:30 AM  Result Value Ref Range   Lactic Acid, Venous >11.0 (HH) 0.5 - 1.9 mmol/L    Comment: CRITICAL VALUE NOTED.  VALUE IS CONSISTENT WITH PREVIOUSLY REPORTED AND CALLED VALUE. Performed at [facility], [address], [place], [place]  [address]   Troponin I (High Sensitivity)     Status: Abnormal   Collection Time: 03/15/21  7:30 AM  Result Value Ref Range   Troponin I (High Sensitivity) 25 (H) <18 ng/L    Comment: (NOTE) Elevated high sensitivity troponin I (hsTnI) values and significant  changes across serial measurements may suggest ACS but many other  chronic and acute conditions are known to elevate hsTnI results.  Refer to the "Links" section for chest pain algorithms and additional  guidance. Performed at [facility], [address], [place], [place] [address]   POC CBG, ED     Status: None   Collection Time: 03/15/21  7:58 AM  Result Value Ref Range   Glucose-Capillary 98 70 - 99  mg/dL    Comment: Glucose reference range applies only to samples taken after fasting for at least 8 hours.  Beta-hydroxybutyric acid     Status: None   Collection Time: 03/15/21  8:19 AM  Result Value Ref Range   Beta-Hydroxybutyric Acid 0.22 0.05 - 0.27 mmol/L    Comment: Performed at [facility], [address] [place], [place] [address]  Ammonia     Status: Abnormal   Collection Time: 03/15/21  8:19 AM  Result Value Ref Range   Ammonia <9 (L) 9 - 35 umol/L    Comment: REPEATED TO VERIFY Performed at [facility], [address], [place], [place] [address]   I-Stat venous blood gas, [facility] ED)     Status: Abnormal   Collection Time: 03/15/21  8:28 AM  Result Value Ref Range   pH, Ven 7.167 (LL) 7.250 - 7.430   pCO2, Ven 16.3 (LL) 44.0 - 60.0 mmHg   pO2, Ven 47.0 (H) 32.0 - 45.0 mmHg   Bicarbonate 5.9 (L) 20.0 - 28.0 mmol/L   TCO2 6 (L) 22 - 32 mmol/L   O2 Saturation 73.0 %   Acid-base deficit 21.0 (H) 0.0 - 2.0 mmol/L   Sodium 144 135 - 145 mmol/L   Potassium 3.0 (L) 3.5 - 5.1 mmol/L   Calcium, Ion 0.72 (LL) 1.15 - 1.40 mmol/L   HCT 20.0 (L) 39.0 - 52.0 %   Hemoglobin 6.8 (LL) 13.0 - 17.0 g/dL   Sample type VENOUS    Comment NOTIFIED PHYSICIAN     CT ABDOMEN PELVIS WO CONTRAST  Result Date: 03/15/2021 CLINICAL DATA:  Altered mental status. Abdominal abscess/infection suspected. EXAM: CT ABDOMEN AND PELVIS WITHOUT CONTRAST TECHNIQUE: Multidetector CT imaging of the abdomen and pelvis was performed following the standard protocol without IV contrast. COMPARISON:  None. FINDINGS: Lower chest: Small right and tiny left pleural effusions are noted. Heart is enlarged. Hepatobiliary: No focal abnormality in the liver on this study without intravenous contrast. There is no evidence for gallstones, gallbladder wall thickening, or pericholecystic fluid. No intrahepatic or extrahepatic biliary dilation. Pancreas: No focal mass lesion. No dilatation of the main duct. No  intraparenchymal cyst. No peripancreatic edema. Spleen: No splenomegaly. No focal mass lesion. Adrenals/Urinary Tract: No adrenal nodule or mass. Kidneys unremarkable on this noncontrast study. No evidence for hydroureter. Bladder decompressed by Foley catheter. Stomach/Bowel: Stomach is mildly distended with fluid. Duodenum is normally positioned as is the ligament of Treitz. No small bowel wall thickening. No small bowel dilatation. The terminal ileum is normal. The appendix is not well visualized, but there is no edema or inflammation in the region of the cecum. No gross colonic mass. No colonic wall thickening. Vascular/Lymphatic: There is abdominal  aortic atherosclerosis without aneurysm. There is no gastrohepatic or hepatoduodenal ligament lymphadenopathy. No retroperitoneal or mesenteric lymphadenopathy. Upper normal left common iliac nodes identified at 10 mm short axis (image 49/series 3. No pelvic sidewall lymphadenopathy. Reproductive: The prostate gland and seminal vesicles are unremarkable. Other: Small volume free fluid seen around the liver and spleen and in the central pelvis. Musculoskeletal: Diffuse body wall edema evident. Compression deformity noted at L1, L2, L3, and L4. Old bilateral rib fractures noted. No worrisome lytic or sclerotic osseous abnormality. IMPRESSION: 1. No acute findings in the abdomen or pelvis. 2. Small volume ascites with diffuse body wall edema. 3. Small right and tiny left pleural effusions. 4. Multiple compression fractures in the lumbar spine. 5. Aortic Atherosclerosis (ICD10-I70.0). Electronically Signed   By: Kennith Center M.D.   On: 03/15/2021 09:13   DG Tibia/Fibula Left  Result Date: 03/15/2021 CLINICAL DATA:  Status post recent BKA with warm skin. EXAM: LEFT TIBIA AND FIBULA - 2 VIEW COMPARISON:  None. FINDINGS: Three views study shows degenerative changes at the knee in this patient status post below the knee amputation. No gas in the soft tissues of the  amputation stump. No bony destruction or lysis to suggest osteomyelitis. IMPRESSION: 1. Status post below the knee amputation. No complicating features by x-ray. 2. Degenerative changes at the knee. Electronically Signed   By: Kennith Center M.D.   On: 03/15/2021 08:22   CT Head Wo Contrast  Result Date: 03/15/2021 CLINICAL DATA:  Mental status change. EXAM: CT HEAD WITHOUT CONTRAST TECHNIQUE: Contiguous axial images were obtained from the base of the skull through the vertex without intravenous contrast. COMPARISON:  CT 02/14/2021 large FINDINGS: Brain: Large RIGHT MCA territory infarction again noted. No evidence of acute new subcortical hypodensity. No extra-axial fluid collections or intracranial hemorrhage. No intraventricular hemorrhage. No midline shift. Vascular: No hyperdense vessel or unexpected calcification. Skull: Normal. Negative for fracture or focal lesion. Sinuses/Orbits: Paranasal sinuses and mastoid air cells are clear. Orbits are clear. Other: None. IMPRESSION: 1. Large RIGHT MCA territory infarction again noted. 2. No acute intracranial hemorrhage. 3. No new infarction. Electronically Signed   By: Genevive Bi M.D.   On: 03/15/2021 07:44   DG Chest Portable 1 View  Result Date: 03/15/2021 CLINICAL DATA:  60 year old male with shortness of breath. Cough and body aches. EXAM: PORTABLE CHEST 1 VIEW COMPARISON:  Portable chest 03/07/2021 and earlier. FINDINGS: Portable AP view at 0555 hours. Stable cardiomegaly and mediastinal contours. Stable lung volumes. Visualized tracheal air column is within normal limits. No pneumothorax, pulmonary edema or consolidation. Hypo ventilation at the left lung base appears stable from recent exams. Right lung base ventilation has improved, probably atelectasis at both sites. No definite pleural effusion. Chronic left posterior rib fractures. Chronic right lateral rib fractures. Chronic left clavicle fracture. No acute osseous abnormality identified.  IMPRESSION: Cardiomegaly with associated mild bibasilar atelectasis. Electronically Signed   By: Odessa Fleming M.D.   On: 03/15/2021 05:59    Review of Systems  Unable to perform ROS: Mental status change   Blood pressure 124/85, pulse (!) 115, temperature 98.9 F (37.2 C), temperature source Rectal, resp. rate (!) 26, height 6\' 3"  (1.905 m), weight 107 kg, SpO2 100 %. Physical Exam Constitutional:      Appearance: He is well-developed. He is ill-appearing. He is not diaphoretic.  HENT:     Head: Normocephalic and atraumatic.  Eyes:     General: No scleral icterus.       Right  eye: No discharge.        Left eye: No discharge.     Conjunctiva/sclera: Conjunctivae normal.  Cardiovascular:     Rate and Rhythm: Regular rhythm. Tachycardia present.  Pulmonary:     Effort: Tachypnea present. No respiratory distress.  Musculoskeletal:     Cervical back: Normal range of motion.     Comments: LLE: Incision C/D/I, no incisional erythema or discharge to indicate infection, no warmth, no fluctuance. Tenderness difficult to evaluate given pt's AMS.  Skin:    General: Skin is warm and dry.  Psychiatric:     Comments: Obtunded     Assessment/Plan: S/p left BKA -- Does not appear LLE is the source of his problem. Will recheck in the morning.    Freeman Caldron, PA-C Orthopedic Surgery [phone] 03/15/2021, 9:33 AM

## 2021-03-15 NOTE — Progress Notes (Signed)
INR > 9.  No evidence of bleeding.  Holding xarelto  dc'd [place] heparin  Plan Repeat INR and CBC in am  scd on RLE  Simonne Martinet ACNP-BC [facility] Pulmonary/Critical Care Pager # [phone] OR # [phone] if no answer

## 2021-03-16 ENCOUNTER — Inpatient Hospital Stay (HOSPITAL_COMMUNITY): Payer: Medicare Other

## 2021-03-16 DIAGNOSIS — R579 Shock, unspecified: Secondary | ICD-10-CM | POA: Diagnosis not present

## 2021-03-16 DIAGNOSIS — E872 Acidosis: Secondary | ICD-10-CM | POA: Diagnosis not present

## 2021-03-16 LAB — GLUCOSE, CAPILLARY
Glucose-Capillary: 145 mg/dL — ABNORMAL HIGH (ref 70–99)
Glucose-Capillary: 145 mg/dL — ABNORMAL HIGH (ref 70–99)
Glucose-Capillary: 192 mg/dL — ABNORMAL HIGH (ref 70–99)
Glucose-Capillary: 198 mg/dL — ABNORMAL HIGH (ref 70–99)
Glucose-Capillary: 152 mg/dL — ABNORMAL HIGH (ref 70–99)
Glucose-Capillary: 214 mg/dL — ABNORMAL HIGH (ref 70–99)

## 2021-03-16 LAB — COMPREHENSIVE METABOLIC PANEL
ALT: 88 U/L — ABNORMAL HIGH (ref 0–44)
ALT: 83 U/L — ABNORMAL HIGH (ref 0–44)
AST: 152 U/L — ABNORMAL HIGH (ref 15–41)
AST: 115 U/L — ABNORMAL HIGH (ref 15–41)
Albumin: 2.2 g/dL — ABNORMAL LOW (ref 3.5–5.0)
Albumin: 2.1 g/dL — ABNORMAL LOW (ref 3.5–5.0)
Alkaline Phosphatase: 75 U/L (ref 38–126)
Alkaline Phosphatase: 66 U/L (ref 38–126)
Anion gap: 10 (ref 5–15)
Anion gap: 8 (ref 5–15)
BUN: 36 mg/dL — ABNORMAL HIGH (ref 6–20)
BUN: 36 mg/dL — ABNORMAL HIGH (ref 6–20)
CO2: 22 mmol/L (ref 22–32)
CO2: 26 mmol/L (ref 22–32)
Calcium: 8.3 mg/dL — ABNORMAL LOW (ref 8.9–10.3)
Calcium: 8.3 mg/dL — ABNORMAL LOW (ref 8.9–10.3)
Chloride: 100 mmol/L (ref 98–111)
Chloride: 100 mmol/L (ref 98–111)
Creatinine, Ser: 1.56 mg/dL — ABNORMAL HIGH (ref 0.61–1.24)
Creatinine, Ser: 1.54 mg/dL — ABNORMAL HIGH (ref 0.61–1.24)
GFR, Estimated: 51 mL/min — ABNORMAL LOW (ref >60)
GFR, Estimated: 52 mL/min — ABNORMAL LOW (ref >60)
Glucose, Bld: 190 mg/dL — ABNORMAL HIGH (ref 70–99)
Glucose, Bld: 154 mg/dL — ABNORMAL HIGH (ref 70–99)
Potassium: 4.8 mmol/L (ref 3.5–5.1)
Potassium: 4.3 mmol/L (ref 3.5–5.1)
Sodium: 132 mmol/L — ABNORMAL LOW (ref 135–145)
Sodium: 134 mmol/L — ABNORMAL LOW (ref 135–145)
Total Bilirubin: 2.5 mg/dL — ABNORMAL HIGH (ref 0.3–1.2)
Total Bilirubin: 2.2 mg/dL — ABNORMAL HIGH (ref 0.3–1.2)
Total Protein: 5.6 g/dL — ABNORMAL LOW (ref 6.5–8.1)
Total Protein: 5.4 g/dL — ABNORMAL LOW (ref 6.5–8.1)

## 2021-03-16 LAB — PROTIME-INR
INR: 3.4 — ABNORMAL HIGH (ref 0.8–1.2)
Prothrombin Time: 34 seconds — ABNORMAL HIGH (ref 11.4–15.2)

## 2021-03-16 LAB — URINE CULTURE: Culture: NO GROWTH

## 2021-03-16 LAB — LACTIC ACID, PLASMA: Lactic Acid, Venous: 1.7 mmol/L (ref 0.5–1.9)

## 2021-03-16 LAB — COOXEMETRY PANEL
Carboxyhemoglobin: 2.1 % — ABNORMAL HIGH (ref 0.5–1.5)
Methemoglobin: 0.5 % (ref 0.0–1.5)
O2 Saturation: 65.8 %
Total hemoglobin: 8.5 g/dL — ABNORMAL LOW (ref 12.0–16.0)

## 2021-03-16 LAB — PHOSPHORUS: Phosphorus: 4.7 mg/dL — ABNORMAL HIGH (ref 2.5–4.6)

## 2021-03-16 LAB — MAGNESIUM: Magnesium: 1.9 mg/dL (ref 1.7–2.4)

## 2021-03-16 MED ORDER — SODIUM CHLORIDE 0.9% FLUSH: 10.0000 mL | INTRAVENOUS | Status: DC | PRN

## 2021-03-16 MED ORDER — FENTANYL CITRATE (PF) 100 MCG/2ML IJ SOLN
INTRAMUSCULAR | Status: AC
  Administered 2021-03-16: 25 ug via INTRAVENOUS
  Filled 2021-03-16: qty 2

## 2021-03-16 MED ORDER — FOOD THICKENER (SIMPLYTHICK)
1.0000 | ORAL | Status: DC | PRN
  Filled 2021-03-16 (×3): qty 1

## 2021-03-16 MED ORDER — FENTANYL CITRATE (PF) 100 MCG/2ML IJ SOLN
25.0000 ug | Freq: Once | INTRAMUSCULAR | Status: AC
Start: 2021-03-16 — End: 2021-03-16

## 2021-03-16 MED ORDER — SODIUM CHLORIDE 0.9% FLUSH
10.0000 mL | Freq: Two times a day (BID) | INTRAVENOUS | Status: DC
  Administered 2021-03-16: 20 mL
  Administered 2021-03-16 – 2021-03-17 (×2): 10 mL

## 2021-03-16 MED ORDER — OXYCODONE HCL 5 MG PO TABS
5.0000 mg | ORAL_TABLET | Freq: Four times a day (QID) | ORAL | Status: DC | PRN
  Administered 2021-03-16 – 2021-03-22 (×16): 5 mg via ORAL
  Filled 2021-03-16 (×17): qty 1

## 2021-03-16 MED ORDER — MIDODRINE HCL 5 MG PO TABS
5.0000 mg | ORAL_TABLET | Freq: Three times a day (TID) | ORAL | Status: DC
  Administered 2021-03-16 – 2021-03-17 (×4): 5 mg via ORAL
  Filled 2021-03-16 (×4): qty 1

## 2021-03-16 MED ORDER — STARCH (THICKENING) PO POWD: ORAL | Status: DC | PRN

## 2021-03-16 MED ORDER — TRAMADOL HCL 50 MG PO TABS: 50.0000 mg | ORAL_TABLET | Freq: Four times a day (QID) | ORAL | Status: DC | PRN

## 2021-03-16 NOTE — Progress Notes (Signed)
Late orders for dobutamine have been received d/t coox 53.4 @ 16:34.  Coox rechecked. Coox now 46.7. Will start dobutamine.

## 2021-03-16 NOTE — Progress Notes (Signed)
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  Additional pertinent PMH: Heart failure with reduced EF 20 to 25% diastolic heart failure cor pulmonale coronary artery disease of the most striking.  Unable to get much history currently.  He presented from the skilled nursing facility this morning on 5/20 with report of shortness of breath/difficulty breathing.  On arrival to the emergency room he was found to have tachypneic labored respiratory pattern, sensorium altered, with slurred speech, his speech was incomprehensible.  He was also found to be tachycardic, his left lower extremity stump was red, some bloody oozing from staple sites, fluctuating fluid palpable.Initial lab work showed mild leukocytosis, hemoglobin 11.2 up from 9.2 he had new kidney failure, and a lactic acidosis of greater than 11.  Culture data was sent, he was urgently started on vancomycin and Zosyn, fluid bolus initiated.  He received approximately 1-1/2 L of crystalloid and still maintained lactate greater than 11, mental status remained altered and because of this the critical care team was asked to admit  Pertinent  Medical History  Just discharged 5/13 after Left BKA for osteomyelitis w/ non/healing LE wound DO NOT RESUSCITATE status prior to arrival Chronic foley (POA) CAF HFrEF (20-25%)  Diastolic HF Cor Pulmonale  CAD Diabetes w/ complications including non-healing ulcers Esophageal stricture requiring dilation  Recent aspiration PNA  Chronic anemia  Depression and anxiety  headaches   HTN and hypotension (home meds including midodrine currently) HL SIADH (remote) OSA Stroke Prior tobacco abuse Prior polysubstance abuse  Significant Hospital Events: Including procedures, antibiotic start and stop dates in addition to other pertinent events   . 5/20 admitted with septic shock, with lactate greater than 11 and new renal failure.  Suspected source left BKA stump, however urine also dirty.  Culture sent.  30 mL/kg PBW crystalloid administered vancomycin and Zosyn started, Ortho consulted . Dobutamine started 5/20 based on Co. Oximetry . Urine culture 5/20 negative . Renal ultrasound 5/20 normal  Interim History / Subjective:   Nausea improved Complains of being thirsty I/O+ 3.9 L total, still has maintenance IV fluid running Lactate improved as below  Objective   Blood pressure 97/76, pulse 80, temperature (!) 96.08 F (35.6 C), resp. rate 20, height 6\' 3"  (1.905 m), weight 107.2 kg, SpO2 91 %.        Intake/Output Summary (Last 24 hours) at 03/16/2021 1210 Last data filed at 03/16/2021 1100 Gross per 24 hour  Intake 5268.7 ml  Output 1085 ml  Net 4183.7 ml   Filed Weights   03/15/21 0423 03/16/21 0500  Weight: 107 kg 107.2 kg    Examination: General comfortable, awake, laying in bed HENT: Oropharynx clear, no upper airway noise, no secretions Lungs: Clear bilaterally, decreased at both bases, comfortable respiratory pattern Cardiovascular: Regular, distant, no murmur Abdomen: Nondistended, hypoactive bowel sounds Extremities: Swollen right lower extremity, somewhat cool Neuro: Awake, alert, interacting appropriately, oriented, moves extremities with good strength  Labs/imaging that I havepersonally reviewed  (right click and "Reselect all SmartList Selections" daily)  See below  Resolved Hospital Problem list   Hyperkalemia Hyponatremia  Assessment & Plan:  Multifactorial shock.  Based on evaluation significant contributor appears to be  cardiogenic shock, benefited from dobutamine.  Consider also hypovolemia, septic shock although no clear source identified.  Cortisol 52.4 -Benefited from dobutamine, volume resuscitation.  Plan to check CVP, goal 8-12.  Recheck Co. oximetry and begin to wean dobutamine -No clear source of infection based on CT abdomen, cultures/labs, on orthopedic evaluation of his BKA.  His blood cultures are pending.  Plan to continue vancomycin and Zosyn for another day, consider beginning to narrow on 5/22 -Home metoprolol on hold -Resume his home midodrine -No indication for stress dose steroids  Severe lactic acidosis, with anion gap metabolic acidosis slow to clear.  Suspect largely due to his cardiomyopathy and relative hypoperfusion.  Consider also contribution of his metformin -Lactic acid has improved with volume and dobutamine -Metformin remains on hold, question whether he will be able to stay on this going forward  Acute metabolic encephalopathy secondary to sepsis (POA), has remote history of stroke, improved Plan -Continue supportive care -Holding off on any sedating medications  HFrEF (EF 20 to 25%) also history of diastolic dysfunction, cor pulmonale, and global hypokinesis with history of coronary artery disease Plan -Metoprolol on hold -Stop IV fluid resuscitation 5/21 given risk for possible volume overload and pulmonary edema -Telemetry  Chronic atrial fibrillation on anticoagulation Plan -Metoprolol on hold -Restart Xarelto when he is taking reliable p.o. either 5/21 or 5/22  Acute kidney injury secondary to shock (present on admission).  Has chronic Foley present on admission.  Renal ultrasound was normal Plan -Restore adequate perfusion -Follow urine output, BMP -Foley was changed  Nausea with mild transaminitis present on admission Plan -Follow LFTs  Diabetes with multiple complications Plan -Holding metformin as above -Sliding-scale insulin as ordered  Chronic  anemia without evidence of bleeding currently.  CBC actually suggests hemoconcentration Plan -Follow CBC   Best practice (right click and "Reselect all SmartList Selections" daily)  Diet:  Oral Pain/Anxiety/Delirium protocol (if indicated): No VAP protocol (if indicated): Not indicated DVT prophylaxis: SCD GI prophylaxis: PPI Glucose control:  SSI Yes Central venous access:  N/A Arterial line:  N/A Foley:  Yes, and it is still needed Mobility:  bed rest  PT consulted: N/A Last date of multidisciplinary goals of care discussion [by palliative] Code Status:  DNR this was present on arrival  Disposition: to ICU  Family: Reviewed status and plans with the patient's parents at bedside on 5/21  Labs   CBC: Recent Labs  Lab 03/09/21 2336 03/15/21 0557 03/15/21 0828 03/15/21 1106 03/15/21 1349  WBC 11.8* 11.9*  --  9.8  --   NEUTROABS 8.5* 9.5*  --   --   --   HGB 9.9* 11.2* 6.8* 9.8* 10.9*  HCT 32.4* 38.0* 20.0* 35.2* 32.0*  MCV 90.8 93.8  --  100.0  --   PLT 384 409*  --  269  --     Basic Metabolic Panel: Recent Labs  Lab 03/15/21 0557 03/15/21 0730 03/15/21 0828 03/15/21 1106 03/15/21 1307 03/15/21 1349 03/15/21 1810 03/15/21 2313 03/16/21 0600  NA 132*  --    < >  --  132* 133* 131* 132* 134*  K 5.4*  --    < >  --  6.2* 5.2* 5.1 4.8 4.3  CL 96*  --   --   --  99  --  99 100 100  CO2 12*  --   --   --  8*  --  18* 22 26  GLUCOSE 52*  --   --   --  111*  --  177* 190* 154*  BUN 28*  --   --   --  31*  --  34* 36* 36*  CREATININE 1.61*  --   --  1.50* 1.73*  --  1.66* 1.56* 1.54*  CALCIUM 8.9  --   --   --  8.7*  --  8.4* 8.3* 8.3*  MG  --  2.1  --   --   --   --   --   --  1.9  PHOS  --   --   --   --   --   --   --   --  4.7*   < > = values in this interval not displayed.   GFR: Estimated Creatinine Clearance: 68.4 mL/min (A) (by C-G formula based on SCr of 1.54 mg/dL (H)). Recent Labs  Lab 03/09/21 2336 03/15/21 0557 03/15/21 0557 03/15/21 0730  03/15/21 1105 03/15/21 1106 03/15/21 1307 03/16/21 0845  WBC 11.8* 11.9*  --   --   --  9.8  --   --   LATICACIDVEN  --  >11.0*   < > >11.0* >11.0*  --  >11.0* 1.7   < > = values in this interval not displayed.    Liver Function Tests: Recent Labs  Lab 03/15/21 0557 03/15/21 1307 03/15/21 1810 03/15/21 2313 03/16/21 0600  AST 45* 128* 164* 152* 115*  ALT 49* 78* 85* 88* 83*  ALKPHOS 97 100 78 75 66  BILITOT 3.6* 4.8* 2.9* 2.5* 2.2*  PROT 7.2 7.7 5.7* 5.6* 5.4*  ALBUMIN 2.7* 2.9* 2.2* 2.2* 2.1*   Recent Labs  Lab 03/15/21 1106  LIPASE 34   Recent Labs  Lab 03/15/21 0819  AMMONIA <9*    ABG    Component Value Date/Time   PHART 7.287 (L) 03/15/2021 1349   PCO2ART 19.7 (LL) 03/15/2021 1349   PO2ART 105 03/15/2021 1349   HCO3 9.5 (L) 03/15/2021 1349   TCO2 10 (L) 03/15/2021 1349   ACIDBASEDEF 15.0 (H) 03/15/2021 1349   O2SAT 65.8 03/16/2021 0229     Coagulation Profile: Recent Labs  Lab 03/15/21 1106 03/16/21 0600  INR 9.3* 3.4*    Cardiac Enzymes: Recent Labs  Lab 03/15/21 0730  CKTOTAL 113    HbA1C: Hgb A1c MFr Bld  Date/Time Value Ref Range Status  02/18/2021 09:40 AM 7.7 (H) 4.8 - 5.6 % Final    Comment:    (NOTE) Pre diabetes:          5.7%-6.4%  Diabetes:              >6.4%  Glycemic control for   <7.0% adults with diabetes   02/16/2021 06:22 AM 7.8 (H) 4.8 - 5.6 % Final    Comment:    (NOTE) Pre diabetes:          5.7%-6.4%  Diabetes:              >6.4%  Glycemic control for   <7.0% adults with diabetes     CBG: Recent Labs  Lab 03/15/21 1609 03/15/21 1942 03/15/21 2321 03/16/21 0406 03/16/21 0816  GLUCAP 140* 180* 176* 145* 145*     Critical care time: 33 minutes    Levy Pupa, MD, PhD 03/16/2021, 1:10 PM [facility] [phone] or if no answer before 7:00PM call [phone] For any issues after 7:00PM please call eLink [phone]

## 2021-03-16 NOTE — Evaluation (Signed)
Clinical/Bedside Swallow Evaluation Patient Details  Name: Fernando Maddox MRN: [id number] Date of Birth: [birth year]  Today's Date: 03/16/2021 Time: SLP Start Time (ACUTE ONLY): 0820 SLP Stop Time (ACUTE ONLY): 0855 SLP Time Calculation (min) (ACUTE ONLY): 35 min  Past Medical History:  Past Medical History:  Diagnosis Date  . Acute combined systolic and diastolic CHF, NYHA class 1 (HCC) 08/07/2016  . Anemia   . Atrial fibrillation (HCC)   . BACK PAIN 02/11/2010   Qualifier: Diagnosis of  By: Manson Passey, RN, BSN, Lauren    . CAD (coronary artery disease) 02/11/2010   Qualifier: Diagnosis of  By: Manson Passey, RN, BSN, Lauren    . Cellulitis 09/29/2017  . Cerebrovascular disease   . CHEST PAIN 02/11/2010   Qualifier: Diagnosis of  By: Manson Passey, RN, BSN, Lauren    . Chronic anticoagulation 03/25/2018  . Chronic pain syndrome   . Chronic systolic congestive heart failure (HCC) 03/25/2018  . Community acquired pneumonia 08/11/2016  . DEPRESSION/ANXIETY 02/11/2010   Qualifier: Diagnosis of  By: Manson Passey, RN, BSN, Lauren    . Diabetes mellitus, type 2 (HCC) 02/11/2010   Qualifier: Diagnosis of  By: Manson Passey, RN, BSN, Lauren    . Diabetic ulcer of toe of right foot associated with type 2 diabetes mellitus (HCC) 10/01/2017  . ESOPHAGEAL STRICTURE 02/11/2010   Qualifier: Diagnosis of  By: Manson Passey, RN, BSN, Lauren    . GERD (gastroesophageal reflux disease)   . HEADACHE 02/11/2010   Qualifier: Diagnosis of  By: Manson Passey, RN, BSN, Lauren    . History of CVA (cerebrovascular accident) 09/29/2017  . History of drug abuse (HCC) 08/05/2016  . Hypercholesteremia   . Hyperlipemia 02/11/2010   Qualifier: Diagnosis of  By: Manson Passey, RN, BSN, Lauren    . Hyperlipidemia   . Hypertension 02/11/2010   Qualifier: Diagnosis of  By: Manson Passey, RN, BSN, Lauren    . HYPERTENSION, UNSPECIFIED 02/11/2010   Qualifier: Diagnosis of  By: Manson Passey, RN, BSN, Lauren    . Hypogonadism male   . 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 Status post thrombectomy  . Osteoarthritis 08/05/2016  . Osteomyelitis, unspecified (HCC) 10/06/2017  . PAF (paroxysmal atrial fibrillation) (HCC) 08/05/2016  . Polysubstance abuse (HCC) 09/29/2017  . Rotator cuff syndrome    Shoulder  . Septic shock (HCC) 02/14/2021  . Shoulder pain   . 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 Sleep apnea   . Stroke (HCC) 08/05/2016  . Tobacco use 08/11/2016  . Ulcer of left foot with necrosis of bone [facility])    Past Surgical History:  Past Surgical History:  Procedure Laterality Date  . AMPUTATION Left 02/20/2021   Procedure: LEFT TRANSMETATARSAL AMPUTATION;  Surgeon: [name], MD;  Location: [facility] OR;  Service: Orthopedics;  Laterality: Left;  . AMPUTATION Left 03/06/2021   Procedure: LEFT BELOW KNEE AMPUTATION;  Surgeon: [name], MD;  Location: [facility] OR;  Service: Orthopedics;  Laterality: Left;  . NO PAST SURGERIES     HPI:  Patient is a 60 y.o. male with PMH: poorly controlled diabetes, HTN, CVAs, atrial fibrilation, osteomyelitis of left LE post BKA, biventricular systolic heart failure, who was brought to ED with AMS and SOB. He was recently admitted to [facility] on 4/21 secondary to wound infection of left foot and had been presenting with AMS likely from sepsis. He underwent a left transmetatarsal amputation on 4/27 but then  required left BKA on 5/11 and then was discharged to SNF on 5/13. During hospital stay, SLP performed BSE and recommended MBS, however patient was discharged prior to this being scheduled and recommendation was for patient to be followed by ST services at [facility].   Assessment / Plan / Recommendation Clinical Impression  Patient presents with a mild oral and a mild-moderate pharyngeal dysphagia. Initially, he was observed to have significant drop in oxygen saturations from mid to  high 90%'s to high 70%'s when he was  drinking liquids via cup or straw sips, however this more likely related to poor placement of oxygen monitor on finger which RN corrected. Patient exhibited very impulsive drinking of liquids requiring full supervision. He exhibited immediate and delayed coughing with thin liquids which occured approximately 30% of the time, but did not exhibit any overt s/s aspiration or penetration with cup sips of nectar thick liquids. As MBS had been recommended during previous, recent hospitalization, SLP is recommending MBS as well as starting patient on conservative oral diet of Dys 1 (puree) solids and nectar thick liquids. SLP Visit Diagnosis: Dysphagia, unspecified (R13.10)    Aspiration Risk  Moderate aspiration risk    Diet Recommendation Dysphagia 1 (Puree);Nectar-thick liquid   Liquid Administration via: Cup Medication Administration: Whole meds with liquid Supervision: Full supervision/cueing for compensatory strategies;Patient able to self feed;Staff to assist with self feeding Compensations: Minimize environmental distractions;Slow rate;Small sips/bites Postural Changes: Seated upright at 90 degrees    Other  Recommendations Oral Care Recommendations: Oral care BID;Staff/trained caregiver to provide oral care Other Recommendations: Order thickener from pharmacy;Prohibited food (jello, ice cream, thin soups);Clarify dietary restrictions   Follow up Recommendations Skilled Nursing facility;24 hour supervision/assistance      Frequency and Duration min 2x/week  1 week       Prognosis Prognosis for Safe Diet Advancement: Good      Swallow Study   General Date of Onset: 03/16/21 HPI: Patient is a 60 y.o. male with PMH: poorly controlled diabetes, HTN, CVAs, atrial fibrilation, osteomyelitis of left LE post BKA, biventricular systolic heart failure, who was brought to ED with AMS and SOB. He was recently admitted to [facility] from [facility] on 4/21 secondary to wound infection of left foot  and had been presenting with AMS likely from sepsis. He underwent a left transmetatarsal amputation on 4/27 but then required left BKA on 5/11 and then was discharged to SNF on 5/13. During hospital stay, SLP performed BSE and recommended MBS, however patient was discharged prior to this being scheduled and recommendation was for patient to be followed by ST services at [facility]. Type of Study: Bedside Swallow Evaluation Previous Swallow Assessment: 5/12 Diet Prior to this Study: NPO Temperature Spikes Noted: No Respiratory Status: Room air History of Recent Intubation: No Behavior/Cognition: Alert;Confused;Cooperative;Impulsive Oral Cavity Assessment: Within Functional Limits Oral Care Completed by SLP: No Oral Cavity - Dentition: Adequate natural dentition;Poor condition Vision: Functional for self-feeding Self-Feeding Abilities: Able to feed self;Needs set up Patient Positioning: Upright in bed Baseline Vocal Quality: Other (comment) (mildly hoarse) Volitional Cough: Strong Volitional Swallow: Able to elicit    Oral/Motor/Sensory Function Overall Oral Motor/Sensory Function: Within functional limits   Ice Chips     Thin Liquid Thin Liquid: Impaired Pharyngeal  Phase Impairments: Cough - Immediate;Cough - Delayed    Nectar Thick Nectar Thick Liquid: Within functional limits Presentation: Cup   Honey Thick     Puree Puree: Impaired Oral Phase Functional Implications: Oral residue   Solid  Solid: Impaired Oral Phase Functional Implications: Oral residue      Angela Nevin, MA, CCC-SLP Speech Therapy [facility]

## 2021-03-17 DIAGNOSIS — R570 Cardiogenic shock: Secondary | ICD-10-CM | POA: Diagnosis not present

## 2021-03-17 LAB — COMPREHENSIVE METABOLIC PANEL
ALT: 77 U/L — ABNORMAL HIGH (ref 0–44)
AST: 75 U/L — ABNORMAL HIGH (ref 15–41)
Albumin: 2.1 g/dL — ABNORMAL LOW (ref 3.5–5.0)
Alkaline Phosphatase: 80 U/L (ref 38–126)
Anion gap: 6 (ref 5–15)
BUN: 31 mg/dL — ABNORMAL HIGH (ref 6–20)
CO2: 26 mmol/L (ref 22–32)
Calcium: 7.9 mg/dL — ABNORMAL LOW (ref 8.9–10.3)
Chloride: 97 mmol/L — ABNORMAL LOW (ref 98–111)
Creatinine, Ser: 1.34 mg/dL — ABNORMAL HIGH (ref 0.61–1.24)
GFR, Estimated: >60 mL/min (ref >60)
Glucose, Bld: 137 mg/dL — ABNORMAL HIGH (ref 70–99)
Potassium: 3.8 mmol/L (ref 3.5–5.1)
Sodium: 129 mmol/L — ABNORMAL LOW (ref 135–145)
Total Bilirubin: 2.1 mg/dL — ABNORMAL HIGH (ref 0.3–1.2)
Total Protein: 5.8 g/dL — ABNORMAL LOW (ref 6.5–8.1)

## 2021-03-17 LAB — CBC
HCT: 28.4 % — ABNORMAL LOW (ref 39.0–52.0)
Hemoglobin: 8.6 g/dL — ABNORMAL LOW (ref 13.0–17.0)
MCH: 27.4 pg (ref 26.0–34.0)
MCHC: 30.3 g/dL (ref 30.0–36.0)
MCV: 90.4 fL (ref 80.0–100.0)
Platelets: 271 10*3/uL (ref 150–400)
RBC: 3.14 MIL/uL — ABNORMAL LOW (ref 4.22–5.81)
RDW: 20 % — ABNORMAL HIGH (ref 11.5–15.5)
WBC: 9.4 10*3/uL (ref 4.0–10.5)
nRBC: 0 % (ref 0.0–0.2)

## 2021-03-17 LAB — GLUCOSE, CAPILLARY
Glucose-Capillary: 141 mg/dL — ABNORMAL HIGH (ref 70–99)
Glucose-Capillary: 142 mg/dL — ABNORMAL HIGH (ref 70–99)
Glucose-Capillary: 146 mg/dL — ABNORMAL HIGH (ref 70–99)
Glucose-Capillary: 164 mg/dL — ABNORMAL HIGH (ref 70–99)
Glucose-Capillary: 170 mg/dL — ABNORMAL HIGH (ref 70–99)
Glucose-Capillary: 171 mg/dL — ABNORMAL HIGH (ref 70–99)

## 2021-03-17 LAB — COOXEMETRY PANEL
Carboxyhemoglobin: 1.7 % — ABNORMAL HIGH (ref 0.5–1.5)
Methemoglobin: 0.9 % (ref 0.0–1.5)
O2 Saturation: 48.3 %
Total hemoglobin: 8.7 g/dL — ABNORMAL LOW (ref 12.0–16.0)

## 2021-03-17 LAB — PHOSPHORUS: Phosphorus: 3.1 mg/dL (ref 2.5–4.6)

## 2021-03-17 LAB — MAGNESIUM: Magnesium: 1.9 mg/dL (ref 1.7–2.4)

## 2021-03-17 LAB — LACTIC ACID, PLASMA: Lactic Acid, Venous: 2.1 mmol/L (ref 0.5–1.9)

## 2021-03-17 MED ORDER — RIVAROXABAN 10 MG PO TABS
20.0000 mg | ORAL_TABLET | Freq: Every day | ORAL | Status: DC
  Administered 2021-03-17 – 2021-03-22 (×6): 20 mg via ORAL
  Filled 2021-03-17 (×4): qty 2
  Filled 2021-03-17: qty 1
  Filled 2021-03-17: qty 2

## 2021-03-17 NOTE — Progress Notes (Signed)
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  Additional pertinent PMH: Heart failure with reduced EF 20 to 25% diastolic heart failure cor pulmonale coronary artery disease of the most striking.  Unable to get much history currently.  He presented from the skilled nursing facility this morning on 5/20 with report of shortness of breath/difficulty breathing.  On arrival to the emergency room he was found to have tachypneic labored respiratory pattern, sensorium altered, with slurred speech, his speech was incomprehensible.  He was also found to be tachycardic, his left lower extremity stump was red, some bloody oozing from staple sites, fluctuating fluid palpable.Initial lab work showed mild leukocytosis, hemoglobin 11.2 up from 9.2 he had new kidney failure, and a lactic acidosis of greater than 11.  Culture data was sent, he was urgently started on vancomycin and Zosyn, fluid bolus initiated.  He received approximately 1-1/2 L of crystalloid and still maintained lactate greater than 11, mental status remained altered and because of this the critical care team was asked to admit  Pertinent  Medical History  Just discharged 5/13 after Left BKA for osteomyelitis w/ non/healing LE wound DO NOT RESUSCITATE status prior to arrival Chronic foley (POA) CAF HFrEF (20-25%)  Diastolic HF Cor Pulmonale  CAD Diabetes w/ complications including non-healing ulcers Esophageal stricture requiring dilation  Recent aspiration PNA  Chronic anemia  Depression and anxiety  headaches   HTN and hypotension (home meds including midodrine currently) HL SIADH (remote) OSA Stroke Prior tobacco abuse Prior polysubstance abuse  Significant Hospital Events: Including procedures, antibiotic start and stop dates in addition to other pertinent events   . 5/20 admitted with septic shock, with lactate greater than 11 and new renal failure.  Suspected source left BKA stump, however urine also dirty.  Culture sent.  30 mL/kg PBW crystalloid administered vancomycin and Zosyn started, Ortho consulted . Dobutamine started 5/20 based on Co. Oximetry . Urine culture 5/20 negative . Blood cultures 5/20 >>  . Renal ultrasound 5/20 normal  Interim History / Subjective:   Weaned his dobutamine to off 5/21 I/O+ 4.5 L total Sodium 134 >> 129 Improving renal function Co-ox 48%   Objective   Blood pressure 101/69, pulse (!) 108, temperature 97.88 F (36.6 C), resp. rate 11, height 6\' 3"  (1.905 m), weight 107.2 kg, SpO2 91 %. CVP:  [26 mmHg] 26 mmHg      Intake/Output Summary (Last 24 hours) at 03/17/2021 0802 Last data filed at 03/16/2021 2156 Gross per 24 hour  Intake 993.12 ml  Output 700 ml  Net 293.12 ml   Filed Weights   03/15/21 0423 03/16/21 0500  Weight: 107 kg 107.2 kg    Examination: General comfortable chronically ill-appearing man, laying in bed HENT: Oropharynx is clear, pupils equal, no stridor or secretions Lungs: Comfortable respiratory pattern, decreased at both bases, no wheezing or crackles Cardiovascular: Regular, distant, no murmur Abdomen: Nondistended, bowel sounds present Extremities: Some swelling of lower extremity, somewhat cool Neuro: Awake and alert, interacting appropriately, answers questions, follows commands  Labs/imaging that I havepersonally reviewed  (right click and "Reselect all SmartList Selections" daily)  See below  Resolved Hospital Problem list   Hyperkalemia Hyponatremia  Assessment & Plan:   Multifactorial shock.  Based  on evaluation significant contributor appears to be cardiogenic shock, benefited from dobutamine.  Consider also hypovolemia, septic shock although no clear source identified.  Cortisol 52.4 -Seemed to get the most benefit from dobutamine, also some volume resuscitation, consistent with principally cardiogenic shock.  His cooximetry is back in the 40s off the dobutamine this morning.  Question superimposed sepsis although no source identified -Stop vancomycin 5/22, plan to complete 5 days total antibiotics.  Continue Zosyn for now (day 3) -Holding home metoprolol -Continue midodrine  Severe lactic acidosis, with anion gap metabolic acidosis was slow to clear.  Suspect largely due to his cardiomyopathy and relative hypoperfusion.  Consider also contribution of his metformin -Lactic acid has cleared -Metformin on hold.  Given this episode question whether it should be discontinued altogether.  Acute metabolic encephalopathy secondary to sepsis (POA), has remote history of stroke, improved Plan -Continue supportive care -Minimizing sedating medications.  Enteral narcotics added back on 5/21 and he appears to be tolerating  HFrEF (EF 20 to 25%) also history of diastolic dysfunction, cor pulmonale, and global hypokinesis with history of coronary artery disease Plan -Metoprolol on hold, add back when hemodynamics allow -Telemetry  Chronic atrial fibrillation on anticoagulation Plan -Metoprolol on hold -Restart Xarelto 5/22  Acute kidney injury secondary to shock (present on admission).  Has chronic Foley present on admission.  Renal ultrasound was normal Plan -Continue supportive care -Follow urine output, BMP -Has a chronic Foley (replaced)  Nausea with mild transaminitis present on admission Plan -Follow LFTs intermittently, improved  Diabetes with multiple complications Plan -Metformin stopped as above -Sliding scale insulin as ordered  Chronic anemia without evidence of bleeding  currently.  CBC actually suggests hemoconcentration Plan -Follow CBC  Disposition -Should be able to transition out of the ICU on 5/22.  We will asked [facility] to assume his care   Best practice (right click and "Reselect all SmartList Selections" daily)  Diet:  Oral Pain/Anxiety/Delirium protocol (if indicated): No VAP protocol (if indicated): Not indicated DVT prophylaxis: SCD GI prophylaxis: PPI Glucose control:  SSI Yes Central venous access:  N/A Arterial line:  N/A Foley:  Yes, and it is still needed Mobility:  bed rest  PT consulted: N/A Last date of multidisciplinary goals of care discussion [by palliative] Code Status:  DNR this was present on arrival  Disposition: to ICU  Family: Reviewed status and plans with the patient's parents at bedside on 5/21  Labs   CBC: Recent Labs  Lab 03/15/21 0557 03/15/21 0828 03/15/21 1106 03/15/21 1349 03/17/21 0448  WBC 11.9*  --  9.8  --  9.4  NEUTROABS 9.5*  --   --   --   --   HGB 11.2* 6.8* 9.8* 10.9* 8.6*  HCT 38.0* 20.0* 35.2* 32.0* 28.4*  MCV 93.8  --  100.0  --  90.4  PLT 409*  --  269  --  271    Basic Metabolic Panel: Recent Labs  Lab 03/15/21 0730 03/15/21 0828 03/15/21 1307 03/15/21 1349 03/15/21 1810 03/15/21 2313 03/16/21 0600 03/17/21 0448  NA  --    < > 132* 133* 131* 132* 134* 129*  K  --    < > 6.2* 5.2* 5.1 4.8 4.3 3.8  CL  --   --  99  --  99 100 100 97*  CO2  --   --  8*  --  18* 22 26 26   GLUCOSE  --   --  111*  --  177* 190* 154* 137*  BUN  --   --  31*  --  34* 36* 36* 31*  CREATININE  --    < > 1.73*  --  1.66* 1.56* 1.54* 1.34*  CALCIUM  --   --  8.7*  --  8.4* 8.3* 8.3* 7.9*  MG 2.1  --   --   --   --   --  1.9 1.9  PHOS  --   --   --   --   --   --  4.7* 3.1   < > = values in this interval not displayed.   GFR: Estimated Creatinine Clearance: 78.6 mL/min (A) (by C-G formula based on SCr of 1.34 mg/dL (H)). Recent Labs  Lab 03/15/21 0557 03/15/21 0730 03/15/21 1105 03/15/21 1106  03/15/21 1307 03/16/21 0845 03/17/21 0448  WBC 11.9*  --   --  9.8  --   --  9.4  LATICACIDVEN >11.0* >11.0* >11.0*  --  >11.0* 1.7  --     Liver Function Tests: Recent Labs  Lab 03/15/21 1307 03/15/21 1810 03/15/21 2313 03/16/21 0600 03/17/21 0448  AST 128* 164* 152* 115* 75*  ALT 78* 85* 88* 83* 77*  ALKPHOS 100 78 75 66 80  BILITOT 4.8* 2.9* 2.5* 2.2* 2.1*  PROT 7.7 5.7* 5.6* 5.4* 5.8*  ALBUMIN 2.9* 2.2* 2.2* 2.1* 2.1*   Recent Labs  Lab 03/15/21 1106  LIPASE 34   Recent Labs  Lab 03/15/21 0819  AMMONIA <9*    ABG    Component Value Date/Time   PHART 7.287 (L) 03/15/2021 1349   PCO2ART 19.7 (LL) 03/15/2021 1349   PO2ART 105 03/15/2021 1349   HCO3 9.5 (L) 03/15/2021 1349   TCO2 10 (L) 03/15/2021 1349   ACIDBASEDEF 15.0 (H) 03/15/2021 1349   O2SAT 48.3 03/17/2021 0440     Coagulation Profile: Recent Labs  Lab 03/15/21 1106 03/16/21 0600  INR 9.3* 3.4*    Cardiac Enzymes: Recent Labs  Lab 03/15/21 0730  CKTOTAL 113    HbA1C: Hgb A1c MFr Bld  Date/Time Value Ref Range Status  02/18/2021 09:40 AM 7.7 (H) 4.8 - 5.6 % Final    Comment:    (NOTE) Pre diabetes:          5.7%-6.4%  Diabetes:              >6.4%  Glycemic control for   <7.0% adults with diabetes   02/16/2021 06:22 AM 7.8 (H) 4.8 - 5.6 % Final    Comment:    (NOTE) Pre diabetes:          5.7%-6.4%  Diabetes:              >6.4%  Glycemic control for   <7.0% adults with diabetes     CBG: Recent Labs  Lab 03/16/21 1326 03/16/21 1520 03/16/21 2006 03/17/21 0027 03/17/21 0432  GLUCAP 198* 152* 214* 171* 142*     Critical care time: NA    03/19/21, MD, PhD 03/17/2021, 8:02 AM [facility] [phone] or if no answer before 7:00PM call [phone] For any issues after 7:00PM please call eLink [phone]

## 2021-03-17 NOTE — Progress Notes (Signed)
Mild swelling of stump no cellulitis. Slight bloody drainage. Albumin 2.1 and expected slight blood from incision with slow healing.

## 2021-03-18 ENCOUNTER — Inpatient Hospital Stay (HOSPITAL_COMMUNITY): Payer: Medicare Other

## 2021-03-18 DIAGNOSIS — I480 Paroxysmal atrial fibrillation: Secondary | ICD-10-CM

## 2021-03-18 DIAGNOSIS — R7401 Elevation of levels of liver transaminase levels: Secondary | ICD-10-CM

## 2021-03-18 DIAGNOSIS — N179 Acute kidney failure, unspecified: Secondary | ICD-10-CM

## 2021-03-18 DIAGNOSIS — G9341 Metabolic encephalopathy: Secondary | ICD-10-CM

## 2021-03-18 DIAGNOSIS — I5022 Chronic systolic (congestive) heart failure: Secondary | ICD-10-CM

## 2021-03-18 HISTORY — DX: Acute kidney failure, unspecified: N17.9

## 2021-03-18 HISTORY — DX: Elevation of levels of liver transaminase levels: R74.01

## 2021-03-18 HISTORY — DX: Metabolic encephalopathy: G93.41

## 2021-03-18 LAB — GLUCOSE, CAPILLARY
Glucose-Capillary: 132 mg/dL — ABNORMAL HIGH (ref 70–99)
Glucose-Capillary: 133 mg/dL — ABNORMAL HIGH (ref 70–99)
Glucose-Capillary: 133 mg/dL — ABNORMAL HIGH (ref 70–99)
Glucose-Capillary: 116 mg/dL — ABNORMAL HIGH (ref 70–99)
Glucose-Capillary: 191 mg/dL — ABNORMAL HIGH (ref 70–99)
Glucose-Capillary: 178 mg/dL — ABNORMAL HIGH (ref 70–99)
Glucose-Capillary: 126 mg/dL — ABNORMAL HIGH (ref 70–99)

## 2021-03-18 LAB — PHOSPHORUS: Phosphorus: 3.1 mg/dL (ref 2.5–4.6)

## 2021-03-18 LAB — BASIC METABOLIC PANEL
Anion gap: 10 (ref 5–15)
BUN: 26 mg/dL — ABNORMAL HIGH (ref 6–20)
CO2: 24 mmol/L (ref 22–32)
Calcium: 8 mg/dL — ABNORMAL LOW (ref 8.9–10.3)
Chloride: 96 mmol/L — ABNORMAL LOW (ref 98–111)
Creatinine, Ser: 1.11 mg/dL (ref 0.61–1.24)
GFR, Estimated: >60 mL/min (ref >60)
Glucose, Bld: 113 mg/dL — ABNORMAL HIGH (ref 70–99)
Potassium: 3.5 mmol/L (ref 3.5–5.1)
Sodium: 130 mmol/L — ABNORMAL LOW (ref 135–145)

## 2021-03-18 LAB — LACTIC ACID, PLASMA: Lactic Acid, Venous: 1.4 mmol/L (ref 0.5–1.9)

## 2021-03-18 LAB — MAGNESIUM: Magnesium: 1.8 mg/dL (ref 1.7–2.4)

## 2021-03-18 MED ORDER — MIDODRINE HCL 5 MG PO TABS: 2.5000 mg | ORAL_TABLET | Freq: Three times a day (TID) | ORAL | Status: DC

## 2021-03-18 MED ORDER — MIDODRINE HCL 5 MG PO TABS: 5.0000 mg | ORAL_TABLET | Freq: Two times a day (BID) | ORAL | Status: DC

## 2021-03-18 MED ORDER — THIAMINE HCL 100 MG PO TABS
100.0000 mg | ORAL_TABLET | Freq: Every day | ORAL | Status: DC
  Administered 2021-03-18 – 2021-03-22 (×5): 100 mg via ORAL
  Filled 2021-03-18 (×5): qty 1

## 2021-03-18 MED ORDER — MIDODRINE HCL 5 MG PO TABS
5.0000 mg | ORAL_TABLET | Freq: Three times a day (TID) | ORAL | Status: DC
  Administered 2021-03-18 – 2021-03-22 (×14): 5 mg via ORAL
  Filled 2021-03-18 (×14): qty 1

## 2021-03-18 MED ORDER — SODIUM CHLORIDE 0.9 % IV SOLN
2.0000 g | INTRAVENOUS | Status: AC
  Administered 2021-03-18 – 2021-03-19 (×2): 2 g via INTRAVENOUS
  Filled 2021-03-18 (×2): qty 20

## 2021-03-18 MED ORDER — PANTOPRAZOLE SODIUM 40 MG PO TBEC
40.0000 mg | DELAYED_RELEASE_TABLET | Freq: Every day | ORAL | Status: DC
  Administered 2021-03-18 – 2021-03-22 (×5): 40 mg via ORAL
  Filled 2021-03-18 (×5): qty 1

## 2021-03-18 NOTE — Progress Notes (Signed)
[facility] PROGRESS NOTE    Progress Note  Fernando Maddox  KNL:[id number] DOB: [birth year] DOA: 03/15/2021 PCP: Crist Fat, MD     Brief Narrative:   Fernando Maddox is an 60 y.o. 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  On arrival to the ED his sensorium was altered slurred speech and hypoxic, he was also found to be tachycardic with some redness in his left lower extremity.  With some bloody oozing from staples and fluctuation, lab work showed a mild leukocytosis hemoglobin of 9 new worsening renal function mild lactic acidosis   Significant Events: 03/15/2021 admitted with septic shock with a lactate greater than 10 you acute kidney injury suspected source left BKA stump Requires pressors and admission  Significant studies: Blood cultures 03/15/2021>> Urine culture 03/15/2021  negative Renal ultrasound unremarkable  Antibiotics: Vancomycin 03/15/2021>> Zosyn 03/16/2019>>5.23.2022 Rocephin 03/19/2019  Microbiology data: Blood culture:  Procedures: None  Assessment/Plan:   Multifactorial shock suspect most likely cardiogenic versus septic shock with no clear source of infection: He did gain the most benefit from dobutamine and volume resuscitation.  His Co-Ox was about 40. He completed a 5-day course of IV vancomycin and will continue de-escalate Zosyn to Rocephin for total of 7 days. Will need a speech evaluation. Swallowing evaluation results is pending.  High anion gap metabolic acidosis with the lactic acidosis: Suspect likely due to cardiomyopathy, he was also on metformin at home which could be contributing to his lactic  acidosis. Continue to hold metformin.  Lactic acidosis has resolved.  Acute metabolic encephalopathy secondary to sepsis present on admission: Continue supportive care minimize sedation.  Chronic diastolic heart failure Holding metoprolol now on midodrine continue to monitor on telemetry. Continue midodrine, will slowly titrate off. Once off can restart him back on his metoprolol.  Chronic atrial fibrillation: Rate controlled around 100 we will wean off midodrine see if his blood pressure can tolerate it. Xarelto restarted on 03/17/2021  Acute kidney injury: Likely due to shock. Creatinine around 1 on admission 1.5 is slowly improving today 1.3.  Mild transaminitis: Continues to improve.  Diabetes mellitus type 2 with peripheral vascular disease: His metformin has been discontinued, he is currently on with minimal insulin needs.  Anemia/normocytic anemia: No signs of overt bleeding.  Disposition: Out of bed to chair physical therapy to evaluate.  Goals of care: Patient noncompliant with his medication as exercise, he has poor insight of medical condition, he has end-stage heart failure unfortunately his overall long-term prognosis is very poor. He is not a candidate for aggressive therapy. Will need to have goals of care discussion at facility.  RN Pressure Injury Documentation: Pressure Injury 02/14/21 Sacrum Medial;Upper Deep Tissue Pressure Injury - Purple or maroon localized area of discolored intact skin or blood-filled blister due to damage of underlying soft tissue from pressure and/or shear. (Active)  02/14/21 2030  Location: Sacrum  Location Orientation: Medial;Upper  Staging: Deep Tissue Pressure Injury - Purple or maroon localized area of discolored intact skin or blood-filled blister due to damage of underlying soft tissue from pressure and/or shear.  Wound Description (Comments):   Present on Admission: Yes    Estimated body mass index is 29.54 kg/m as  calculated from the following:  Height as of this encounter: 6\' 3"  (1.905 m).   Weight as of this encounter: 107.2 kg.  DVT prophylaxis: lovenox Family Communication:none Status is: Inpatient  Remains inpatient appropriate because:Hemodynamically unstable   Dispo: The patient is from: SNF              Anticipated d/c is to: SNF              Patient currently is not medically stable to d/c.   Difficult to place patient No  Code Status:     Code Status Orders  (From admission, onward)         Start     Ordered   03/15/21 0956  Do not attempt resuscitation (DNR)  Continuous       Question Answer Comment  In the event of cardiac or respiratory ARREST Do not call a "code blue"   In the event of cardiac or respiratory ARREST Do not perform Intubation, CPR, defibrillation or ACLS   In the event of cardiac or respiratory ARREST Use medication by any route, position, wound care, and other measures to relive pain and suffering. May use oxygen, suction and manual treatment of airway obstruction as needed for comfort.      03/15/21 1001        Code Status History    Date Active Date Inactive Code Status Order ID Comments User Context   03/15/2021 0807 03/15/2021 1001 DNR [id number]  Louretta ParmaStewardson, Kendall, DO ED   02/27/2021 1151 03/09/2021 1704 DNR [id number]  Ulice BoldParker, Alicia C, NP Inpatient   02/14/2021 1959 02/27/2021 1151 Full Code [id number]  Charlott Holleresai, Nikita S, MD Inpatient   Advance Care Planning Activity    Advance Directive Documentation   Flowsheet Row Most Recent Value  Type of Advance Directive Out of facility DNR (pink MOST or yellow form)  Pre-existing out of facility DNR order (yellow form or pink MOST form) --  "MOST" Form in Place? --        IV Access:    Peripheral IV   Procedures and diagnostic studies:   No results found.   Medical Consultants:    None.   Subjective:    Fernando FlockGary L Maddox no complaints.  Objective:    Vitals:   03/17/21 1859 03/17/21  2045 03/18/21 0104 03/18/21 0456  BP: (!) 119/93 (!) 123/94 104/80 101/77  Pulse: 96 68 87 97  Resp: 16 16 18 16   Temp: 98 F (36.7 C) 97.7 F (36.5 C) 98.7 F (37.1 C) 97.8 F (36.6 C)  TempSrc: Oral Oral Oral   SpO2: 100% 97% 93% 97%  Weight:      Height:       SpO2: 97 %   Intake/Output Summary (Last 24 hours) at 03/18/2021 0812 Last data filed at 03/18/2021 0600 Gross per 24 hour  Intake 480 ml  Output 750 ml  Net -270 ml   Filed Weights   03/15/21 0423 03/16/21 0500  Weight: 107 kg 107.2 kg    Exam: General exam: In no acute distress. Respiratory system: Good air movement and clear to auscultation. Cardiovascular system: S1 & S2 heard, RRR. No JVD. Gastrointestinal system: Abdomen is nondistended, soft and nontender.  Extremities: No pedal edema. Skin: No rashes, lesions or ulcers Psychiatry: Judgement and insight appear normal. Mood & affect appropriate.   Data Reviewed:    Labs: Basic Metabolic Panel: Recent Labs  Lab 03/15/21 0730 03/15/21 [id number] 03/15/21 1307 03/15/21 1349 03/15/21 1810 03/15/21 2313 03/16/21 0600 03/17/21 0448  NA  --    < > 132* 133* 131* 132* 134* 129*  K  --    < > 6.2* 5.2* 5.1 4.8 4.3 3.8  CL  --   --  99  --  99 100 100 97*  CO2  --   --  8*  --  18* 22 26 26   GLUCOSE  --   --  111*  --  177* 190* 154* 137*  BUN  --   --  31*  --  34* 36* 36* 31*  CREATININE  --    < > 1.73*  --  1.66* 1.56* 1.54* 1.34*  CALCIUM  --   --  8.7*  --  8.4* 8.3* 8.3* 7.9*  MG 2.1  --   --   --   --   --  1.9 1.9  PHOS  --   --   --   --   --   --  4.7* 3.1   < > = values in this interval not displayed.   GFR Estimated Creatinine Clearance: 78.6 mL/min (A) (by C-G formula based on SCr of 1.34 mg/dL (H)). Liver Function Tests: Recent Labs  Lab 03/15/21 1307 03/15/21 1810 03/15/21 2313 03/16/21 0600 03/17/21 0448  AST 128* 164* 152* 115* 75*  ALT 78* 85* 88* 83* 77*  ALKPHOS 100 78 75 66 80  BILITOT 4.8* 2.9* 2.5* 2.2* 2.1*  PROT  7.7 5.7* 5.6* 5.4* 5.8*  ALBUMIN 2.9* 2.2* 2.2* 2.1* 2.1*   Recent Labs  Lab 03/15/21 1106  LIPASE 34   Recent Labs  Lab 03/15/21 0819  AMMONIA <9*   Coagulation profile Recent Labs  Lab 03/15/21 1106 03/16/21 0600  INR 9.3* 3.4*   COVID-19 Labs  No results for input(s): DDIMER, FERRITIN, LDH, CRP in the last 72 hours.  Lab Results  Component Value Date   SARSCOV2NAA NEGATIVE 03/15/2021   SARSCOV2NAA NEGATIVE 03/08/2021   SARSCOV2NAA NEGATIVE 02/25/2021   SARSCOV2NAA NEGATIVE 02/20/2021    CBC: Recent Labs  Lab 03/15/21 0557 03/15/21 0828 03/15/21 1106 03/15/21 1349 03/17/21 0448  WBC 11.9*  --  9.8  --  9.4  NEUTROABS 9.5*  --   --   --   --   HGB 11.2* 6.8* 9.8* 10.9* 8.6*  HCT 38.0* 20.0* 35.2* 32.0* 28.4*  MCV 93.8  --  100.0  --  90.4  PLT 409*  --  269  --  271   Cardiac Enzymes: Recent Labs  Lab 03/15/21 0730  CKTOTAL 113   BNP (last 3 results) No results for input(s): PROBNP in the last 8760 hours. CBG: Recent Labs  Lab 03/17/21 1111 03/17/21 1605 03/17/21 2120 03/18/21 0458 03/18/21 0751  GLUCAP 141* 170* 146* 133* 116*   D-Dimer: No results for input(s): DDIMER in the last 72 hours. Hgb A1c: No results for input(s): HGBA1C in the last 72 hours. Lipid Profile: No results for input(s): CHOL, HDL, LDLCALC, TRIG, CHOLHDL, LDLDIRECT in the last 72 hours. Thyroid function studies: No results for input(s): TSH, T4TOTAL, T3FREE, THYROIDAB in the last 72 hours.  Invalid input(s): FREET3 Anemia work up: No results for input(s): VITAMINB12, FOLATE, FERRITIN, TIBC, IRON, RETICCTPCT in the last 72 hours. Sepsis Labs: Recent Labs  Lab 03/15/21 0557 03/15/21 0730 03/15/21 1106 03/15/21 1307 03/16/21 0845 03/17/21 0448 03/17/21 0950 03/18/21 0615  WBC 11.9*  --  9.8  --   --  9.4  --   --   LATICACIDVEN >11.0*   < >  --  >  11.0* 1.7  --  2.1* 1.4   < > = values in this interval not displayed.   Microbiology Recent Results (from the  past 240 hour(s))  SARS CORONAVIRUS 2 (TAT 6-24 HRS) Nasopharyngeal Nasopharyngeal Swab     Status: None   Collection Time: 03/08/21 11:43 AM   Specimen: Nasopharyngeal Swab  Result Value Ref Range Status   SARS Coronavirus 2 NEGATIVE NEGATIVE Final    Comment: (NOTE) SARS-CoV-2 target nucleic acids are NOT DETECTED.  The SARS-CoV-2 RNA is generally detectable in upper and lower respiratory specimens during the acute phase of infection. Negative results do not preclude SARS-CoV-2 infection, do not rule out co-infections with other pathogens, and should not be used as the sole basis for treatment or other patient management decisions. Negative results must be combined with clinical observations, patient history, and epidemiological information. The expected result is Negative.  Fact Sheet for Patients: HairSlick.no  Fact Sheet for Healthcare Providers: quierodirigir.com  This test is not yet approved or cleared by the [place] FDA and  has been authorized for detection and/or diagnosis of SARS-CoV-2 by FDA under an Emergency Use Authorization (EUA). This EUA will remain  in effect (meaning this test can be used) for the duration of the COVID-19 declaration under Se ction 564(b)(1) of the Act, 21 U.S.C. section 360bbb-3(b)(1), unless the authorization is terminated or revoked sooner.  Performed at [facility], [address], [place], [place] [address]   Culture, blood (routine x 2)     Status: None (Preliminary result)   Collection Time: 03/15/21  5:38 AM   Specimen: BLOOD  Result Value Ref Range Status   Specimen Description BLOOD RIGHT ANTECUBITAL  Final   Special Requests   Final    BOTTLES DRAWN AEROBIC AND ANAEROBIC Blood Culture adequate volume   Culture   Final    NO GROWTH 2 DAYS Performed at [facility], [address], [place], [place] [address]    Report Status PENDING  Incomplete  Urine  culture     Status: None   Collection Time: 03/15/21  5:38 AM   Specimen: Urine, Random  Result Value Ref Range Status   Specimen Description URINE, RANDOM  Final   Special Requests NONE  Final   Culture   Final    NO GROWTH Performed at [facility], [address], [place], [place] [address]    Report Status 03/16/2021 FINAL  Final  Culture, blood (routine x 2)     Status: None (Preliminary result)   Collection Time: 03/15/21  5:43 AM   Specimen: BLOOD  Result Value Ref Range Status   Specimen Description BLOOD BLOOD RIGHT FOREARM  Final   Special Requests   Final    BOTTLES DRAWN AEROBIC AND ANAEROBIC Blood Culture adequate volume   Culture   Final    NO GROWTH 2 DAYS Performed at [facility], [address], [place], [place] [address]    Report Status PENDING  Incomplete  Resp Panel by RT-PCR (Flu A&B, Covid) Nasopharyngeal Swab     Status: None   Collection Time: 03/15/21  7:42 AM   Specimen: Nasopharyngeal Swab; Nasopharyngeal(NP) swabs in vial transport medium  Result Value Ref Range Status   SARS Coronavirus 2 by RT PCR NEGATIVE NEGATIVE Final    Comment: (NOTE) SARS-CoV-2 target nucleic acids are NOT DETECTED.  The SARS-CoV-2 RNA is generally detectable in upper respiratory specimens during the acute phase of infection. The lowest  concentration of SARS-CoV-2 viral copies this assay can detect is 138 copies/mL. A negative result does not preclude SARS-Cov-2 infection and should not be used as the sole basis for treatment or other patient management decisions. A negative result may occur with  improper specimen collection/handling, submission of specimen other than nasopharyngeal swab, presence of viral mutation(s) within the areas targeted by this assay, and inadequate number of viral copies(<138 copies/mL). A negative result must be combined with clinical observations, patient history, and epidemiological information. The expected result is  Negative.  Fact Sheet for Patients:  BloggerCourse.com  Fact Sheet for Healthcare Providers:  SeriousBroker.it  This test is no t yet approved or cleared by the [place] FDA and  has been authorized for detection and/or diagnosis of SARS-CoV-2 by FDA under an Emergency Use Authorization (EUA). This EUA will remain  in effect (meaning this test can be used) for the duration of the COVID-19 declaration under Section 564(b)(1) of the Act, 21 U.S.C.section 360bbb-3(b)(1), unless the authorization is terminated  or revoked sooner.       Influenza A by PCR NEGATIVE NEGATIVE Final   Influenza B by PCR NEGATIVE NEGATIVE Final    Comment: (NOTE) The Xpert Xpress SARS-CoV-2/FLU/RSV plus assay is intended as an aid in the diagnosis of influenza from Nasopharyngeal swab specimens and should not be used as a sole basis for treatment. Nasal washings and aspirates are unacceptable for Xpert Xpress SARS-CoV-2/FLU/RSV testing.  Fact Sheet for Patients: BloggerCourse.com  Fact Sheet for Healthcare Providers: SeriousBroker.it  This test is not yet approved or cleared by the [place] FDA and has been authorized for detection and/or diagnosis of SARS-CoV-2 by FDA under an Emergency Use Authorization (EUA). This EUA will remain in effect (meaning this test can be used) for the duration of the COVID-19 declaration under Section 564(b)(1) of the Act, 21 U.S.C. section 360bbb-3(b)(1), unless the authorization is terminated or revoked.  Performed at [facility], [address], [place], [place] [address]   MRSA PCR Screening     Status: None   Collection Time: 03/15/21 12:55 PM   Specimen: Nasal Mucosa; Nasopharyngeal  Result Value Ref Range Status   MRSA by PCR NEGATIVE NEGATIVE Final    Comment:        The GeneXpert MRSA Assay (FDA approved for NASAL specimens only), is one  component of a comprehensive MRSA colonization surveillance program. It is not intended to diagnose MRSA infection nor to guide or monitor treatment for MRSA infections. Performed at [facility], [address] [place], [place] [address]      Medications:   . Chlorhexidine Gluconate Cloth  6 each Topical Daily  . insulin aspart  0-6 Units Subcutaneous Q4H  . midodrine  5 mg Oral TID WC  . pantoprazole (PROTONIX) IV  40 mg Intravenous QHS  . rivaroxaban  20 mg Oral Q supper  . thiamine injection  100 mg Intravenous Daily  . Thrombi-Pad  1 each Topical Once   Continuous Infusions: . sodium chloride 10 mL/hr at 03/16/21 1800  . cefTRIAXone (ROCEPHIN)  IV        LOS: 3 days   Marinda Elk  [facility]  03/18/2021, 8:12 AM

## 2021-03-18 NOTE — Progress Notes (Signed)
Objective Swallowing Evaluation: Type of Study: MBS-Modified Barium Swallow Study   Patient Details  Name: Fernando Maddox MRN: [id number] Date of Birth: [birth year]  Today's Date: 03/18/2021 Time: SLP Start Time (ACUTE ONLY): 8250 -SLP Stop Time (ACUTE ONLY): 0850  SLP Time Calculation (min) (ACUTE ONLY): 12 min   Past Medical History:  Past Medical History:  Diagnosis Date  . Acute combined systolic and diastolic CHF, NYHA class 1 (HCC) 08/07/2016  . Anemia   . Atrial fibrillation (HCC)   . BACK PAIN 02/11/2010   Qualifier: Diagnosis of  By: Manson Passey, RN, BSN, Lauren    . CAD (coronary artery disease) 02/11/2010   Qualifier: Diagnosis of  By: Manson Passey, RN, BSN, Lauren    . Cellulitis 09/29/2017  . Cerebrovascular disease   . CHEST PAIN 02/11/2010   Qualifier: Diagnosis of  By: Manson Passey, RN, BSN, Lauren    . Chronic anticoagulation 03/25/2018  . Chronic pain syndrome   . Chronic systolic congestive heart failure (HCC) 03/25/2018  . Community acquired pneumonia 08/11/2016  . DEPRESSION/ANXIETY 02/11/2010   Qualifier: Diagnosis of  By: Manson Passey, RN, BSN, Lauren    . Diabetes mellitus, type 2 (HCC) 02/11/2010   Qualifier: Diagnosis of  By: Manson Passey, RN, BSN, Lauren    . Diabetic ulcer of toe of right foot associated with type 2 diabetes mellitus (HCC) 10/01/2017  . ESOPHAGEAL STRICTURE 02/11/2010   Qualifier: Diagnosis of  By: Manson Passey, RN, BSN, Lauren    . GERD (gastroesophageal reflux disease)   . HEADACHE 02/11/2010   Qualifier: Diagnosis of  By: Manson Passey, RN, BSN, Lauren    . History of CVA (cerebrovascular accident) 09/29/2017  . History of drug abuse (HCC) 08/05/2016  . Hypercholesteremia   . Hyperlipemia 02/11/2010   Qualifier: Diagnosis of  By: Manson Passey, RN, BSN, Lauren    . Hyperlipidemia   . Hypertension 02/11/2010   Qualifier: Diagnosis of  By: Manson Passey, RN, BSN, Lauren    . HYPERTENSION, UNSPECIFIED 02/11/2010   Qualifier: Diagnosis of  By: Manson Passey, RN, BSN, Lauren    . Hypogonadism male   . 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 Status post thrombectomy  . Osteoarthritis 08/05/2016  . Osteomyelitis, unspecified (HCC) 10/06/2017  . PAF (paroxysmal atrial fibrillation) (HCC) 08/05/2016  . Polysubstance abuse (HCC) 09/29/2017  . Rotator cuff syndrome    Shoulder  . Septic shock (HCC) 02/14/2021  . Shoulder pain   . 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 Sleep apnea   . Stroke (HCC) 08/05/2016  . Tobacco use 08/11/2016  . Ulcer of left foot with necrosis of bone [facility])    Past Surgical History:  Past Surgical History:  Procedure Laterality Date  . AMPUTATION Left 02/20/2021   Procedure: LEFT TRANSMETATARSAL AMPUTATION;  Surgeon: [name], MD;  Location: [facility] OR;  Service: Orthopedics;  Laterality: Left;  . AMPUTATION Left 03/06/2021   Procedure: LEFT BELOW KNEE AMPUTATION;  Surgeon: [name], MD;  Location: [place] OR;  Service: Orthopedics;  Laterality: Left;  . NO PAST SURGERIES     HPI: Patient is a 60 y.o. male with PMH: poorly controlled diabetes, HTN, CVAs, atrial fibrilation, osteomyelitis of left LE post BKA, biventricular systolic heart failure, who was brought to ED with AMS and SOB. He was recently admitted to [facility] from [facility] on 4/21 secondary to wound infection of left foot and had been presenting with AMS likely from sepsis.  He underwent a left transmetatarsal amputation on 4/27 but then required left BKA on 5/11 and then was discharged to SNF on 5/13. During hospital stay, SLP performed BSE and recommended MBS, however patient was discharged prior to this being scheduled and recommendation was for patient to be followed by ST services at [facility].   Subjective: alert, impulsive, confused    Assessment / Plan / Recommendation  CHL IP CLINICAL IMPRESSIONS 03/18/2021  Clinical Impression Pt demonstrates adequate oral and oropharyngeal function. There  was an instance of trace penetration without aspiration after taking large rapid sips with a pill, but otherwise strength and timing WNL. Pt without coughing during assessment. No pulse oximetry available during testing, but pts respiratory pattern appeared WNL. Esophageal sweep achieved to mid esopahgus (leg blocking further sweep) appeared WNL though no radiologist was present to confirm. Recommend upgrade to regualr diet and thin liquids.  SLP Visit Diagnosis Dysphagia, unspecified (R13.10)  Attention and concentration deficit following --  Frontal lobe and executive function deficit following --  Impact on safety and function Mild aspiration risk      CHL IP TREATMENT RECOMMENDATION 03/18/2021  Treatment Recommendations No treatment recommended at this time     Prognosis 03/16/2021  Prognosis for Safe Diet Advancement Good  Barriers to Reach Goals --  Barriers/Prognosis Comment --    CHL IP DIET RECOMMENDATION 03/18/2021  SLP Diet Recommendations Regular solids;Thin liquid  Liquid Administration via Cup;Straw  Medication Administration Whole meds with liquid  Compensations Minimize environmental distractions;Slow rate;Small sips/bites  Postural Changes Seated upright at 90 degrees      CHL IP OTHER RECOMMENDATIONS 03/16/2021  Recommended Consults --  Oral Care Recommendations --  Other Recommendations Order thickener from pharmacy;Prohibited food (jello, ice cream, thin soups);Clarify dietary restrictions      CHL IP FOLLOW UP RECOMMENDATIONS 03/18/2021  Follow up Recommendations None      CHL IP FREQUENCY AND DURATION 03/16/2021  Speech Therapy Frequency (ACUTE ONLY) min 2x/week  Treatment Duration 1 week           CHL IP ORAL PHASE 03/18/2021  Oral Phase WFL  Oral - Pudding Teaspoon --  Oral - Pudding Cup --  Oral - Honey Teaspoon --  Oral - Honey Cup --  Oral - Nectar Teaspoon --  Oral - Nectar Cup --  Oral - Nectar Straw --  Oral - Thin Teaspoon --  Oral - Thin Cup  --  Oral - Thin Straw --  Oral - Puree --  Oral - Mech Soft --  Oral - Regular --  Oral - Multi-Consistency --  Oral - Pill --  Oral Phase - Comment --    CHL IP PHARYNGEAL PHASE 03/18/2021  Pharyngeal Phase WFL  Pharyngeal- Pudding Teaspoon --  Pharyngeal --  Pharyngeal- Pudding Cup --  Pharyngeal --  Pharyngeal- Honey Teaspoon --  Pharyngeal --  Pharyngeal- Honey Cup --  Pharyngeal --  Pharyngeal- Nectar Teaspoon --  Pharyngeal --  Pharyngeal- Nectar Cup --  Pharyngeal --  Pharyngeal- Nectar Straw --  Pharyngeal --  Pharyngeal- Thin Teaspoon --  Pharyngeal --  Pharyngeal- Thin Cup --  Pharyngeal --  Pharyngeal- Thin Straw --  Pharyngeal --  Pharyngeal- Puree --  Pharyngeal --  Pharyngeal- Mechanical Soft --  Pharyngeal --  Pharyngeal- Regular --  Pharyngeal --  Pharyngeal- Multi-consistency --  Pharyngeal --  Pharyngeal- Pill --  Pharyngeal --  Pharyngeal Comment --     No flowsheet data found.  Harlon Ditty, MA CCC-SLP  Acute Rehabilitation Services Pager [phone] Office [phone]  Claudine Mouton 03/18/2021, 9:49 AM

## 2021-03-18 NOTE — Progress Notes (Signed)
Ok to add a stop date of 5d of total abx (zosyn>ceftriaxone) per Dr David Stall.  Ulyses Southward, PharmD, BCIDP, AAHIVP, CPP Infectious Disease Pharmacist 03/18/2021 8:36 AM

## 2021-03-18 NOTE — Progress Notes (Signed)
Noted urine from patient's Foley is pink. Notified Dr. Rachael Darby and he indicated the rounding team will came in shortly and evaluate him.  Will continue to monitor.

## 2021-03-18 NOTE — Evaluation (Signed)
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 Admitted with severe septic shock with unknown source of infection. Patient has been in rehab since discharge 5/13 after BKA.  Clinical Impression  Patient received in bed, parents at bedside, but left prior to mobility. Patient is groggy and drifting back to sleep at times during session. He requires mod assist for supine to sit and is able to sit up edge of bed x 5 min with B UE support and R foot supported. Supervision for safety. Transfer not attempted at this time due to lethargy. Patient will continue to benefit from skilled PT while here to improve functional mobility, strength and safety.      Follow Up Recommendations SNF;Supervision/Assistance - 24 hour    Equipment Recommendations  Other (comment) (TBD)    Recommendations for Other Services       Precautions / Restrictions Precautions Precautions: Fall Required Braces or Orthoses: Other Brace Other Brace: limb protector Restrictions Weight Bearing Restrictions: Yes LLE Weight Bearing: Non weight bearing Other Position/Activity Restrictions: L BKA      Mobility  Bed Mobility Overal bed mobility: Needs Assistance Bed Mobility: Supine to Sit;Sit to Supine     Supine to sit: Mod assist;HOB elevated Sit to supine: Min assist   General bed mobility comments: Patient requires increased time for mobility due to grogginess. Requires repeated cues, tactile stim to continue.    Transfers                 General transfer comment: not attempted this date due to lethargy  Ambulation/Gait             General Gait Details:  unable- safety  Stairs            Wheelchair Mobility    Modified Rankin (Stroke Patients Only)       Balance Overall balance assessment: Needs assistance Sitting-balance support: Feet supported;Bilateral upper extremity supported Sitting balance-Leahy Scale: Good Sitting balance - Comments: able to sit independently requires supervision                                     Pertinent Vitals/Pain Pain Assessment: Faces Faces Pain Scale: Hurts even more Pain Location: L BKA site Pain Descriptors / Indicators: Aching;Discomfort;Sore Pain Intervention(s): Monitored during session;Repositioned    Home Living Family/patient expects to be discharged to:: Skilled nursing facility Living Arrangements: Alone Available Help at Discharge: Family;Available PRN/intermittently Type of Home: House Home Access: Stairs to enter Entrance Stairs-Rails: Doctor, general practice of Steps: 5 Home Layout: One level Home Equipment: None      Prior Function Level of Independence: Independent         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     Hand Dominance   Dominant Hand: Right    Extremity/Trunk Assessment   Upper Extremity Assessment Upper Extremity Assessment: Generalized weakness    Lower Extremity Assessment Lower Extremity Assessment: Generalized weakness;LLE deficits/detail LLE Deficits / Details: transmetatarsal amputation LLE: Unable to fully assess due to pain LLE Coordination: decreased gross motor    Cervical / Trunk Assessment Cervical / Trunk Assessment: Normal  Communication   Communication: Expressive difficulties;Other (comment) (garbled speech)  Cognition Arousal/Alertness: Lethargic Behavior During Therapy: Flat affect Overall Cognitive Status: Impaired/Different from baseline Area of Impairment: Following commands;Safety/judgement;Awareness;Problem solving;Attention                 Orientation  Level: Disoriented to;Situation Current Attention Level: Sustained   Following Commands: Follows one step commands with increased time Safety/Judgement: Decreased awareness of deficits;Decreased awareness of safety Awareness: Intellectual Problem Solving: Slow processing;Decreased initiation;Difficulty sequencing;Requires verbal cues;Requires tactile cues General Comments: patient groggy during session. Patient's parents in room initially stating he fell at rehab due to confusion trying to get oob by himself      General Comments      Exercises     Assessment/Plan    PT Assessment Patient needs continued PT services  PT Problem List Decreased strength;Decreased activity tolerance;Decreased balance;Decreased mobility;Decreased cognition;Decreased safety awareness;Decreased knowledge of precautions;Decreased knowledge of use of DME;Pain;Decreased range of motion       PT Treatment Interventions DME instruction;Gait training;Functional mobility training;Therapeutic activities;Therapeutic exercise;Balance training;Cognitive remediation;Patient/family education    PT Goals (Current goals can be found in the Care Plan section)  Acute Rehab PT Goals Patient Stated Goal: none stated PT Goal Formulation: With patient Time For Goal Achievement: 04/01/21    Frequency Min 2X/week   Barriers to discharge Inaccessible home environment;Decreased caregiver support      Co-evaluation               AM-PAC PT "6 Clicks" Mobility  Outcome Measure Help needed turning from your back to your side while in a flat bed without using bedrails?: A Lot Help needed moving from lying on your back to sitting on the side of a flat bed without using bedrails?: A Lot Help needed moving to and from a bed to a chair (including a wheelchair)?: Total Help needed standing up from a chair using your arms (e.g., wheelchair or bedside chair)?: Total Help needed to walk in hospital room?: Total Help needed  climbing 3-5 steps with a railing? : Total 6 Click Score: 8    End of Session   Activity Tolerance: Patient limited by fatigue;Patient limited by lethargy;Patient limited by pain Patient left: in bed;with call bell/phone within reach;with bed alarm set Nurse Communication: Mobility status PT Visit Diagnosis: Other abnormalities of gait and mobility (R26.89);Pain;History of falling (Z91.81);Muscle weakness (generalized) (M62.81) Pain - Right/Left: Left Pain - part of body: Leg    Time: 1335-1405 PT Time Calculation (min) (ACUTE ONLY): 30 min   Charges:   PT Evaluation $PT Eval Moderate Complexity: 1 Mod PT Treatments $Therapeutic Activity: 8-22 mins        [employer], PT, [employer] 03/18/21,2:22 PM

## 2021-03-18 NOTE — Plan of Care (Signed)
  Problem: Education: Goal: Knowledge of General Education information will improve Description Including pain rating scale, medication(s)/side effects and non-pharmacologic comfort measures Outcome: Progressing   

## 2021-03-19 DIAGNOSIS — G9341 Metabolic encephalopathy: Secondary | ICD-10-CM | POA: Diagnosis not present

## 2021-03-19 DIAGNOSIS — I480 Paroxysmal atrial fibrillation: Secondary | ICD-10-CM | POA: Diagnosis not present

## 2021-03-19 DIAGNOSIS — I5022 Chronic systolic (congestive) heart failure: Secondary | ICD-10-CM | POA: Diagnosis not present

## 2021-03-19 DIAGNOSIS — N179 Acute kidney failure, unspecified: Secondary | ICD-10-CM | POA: Diagnosis not present

## 2021-03-19 LAB — GLUCOSE, CAPILLARY
Glucose-Capillary: 116 mg/dL — ABNORMAL HIGH (ref 70–99)
Glucose-Capillary: 131 mg/dL — ABNORMAL HIGH (ref 70–99)
Glucose-Capillary: 134 mg/dL — ABNORMAL HIGH (ref 70–99)
Glucose-Capillary: 148 mg/dL — ABNORMAL HIGH (ref 70–99)
Glucose-Capillary: 174 mg/dL — ABNORMAL HIGH (ref 70–99)
Glucose-Capillary: 204 mg/dL — ABNORMAL HIGH (ref 70–99)

## 2021-03-19 MED ORDER — FUROSEMIDE 40 MG PO TABS: 40.0000 mg | ORAL_TABLET | Freq: Two times a day (BID) | ORAL | Status: DC

## 2021-03-19 MED ORDER — FUROSEMIDE 20 MG PO TABS
20.0000 mg | ORAL_TABLET | Freq: Two times a day (BID) | ORAL | Status: DC
  Administered 2021-03-19 – 2021-03-22 (×8): 20 mg via ORAL
  Filled 2021-03-19 (×8): qty 1

## 2021-03-19 NOTE — Care Management Important Message (Signed)
Important Message  Patient Details  Name: Fernando Maddox MRN: [id number] Date of Birth: July 11, 1961   Medicare Important Message Given:  Yes     Sascha Baugher P Emilea Goga 03/19/2021, 1:40 PM

## 2021-03-19 NOTE — Progress Notes (Signed)
[facility] PROGRESS NOTE    Progress Note  Fernando Maddox  UJW:[id number] DOB: 05/28/1961 DOA: 03/15/2021 PCP: [name], Jason, MD     Brief Narrative:   Fernando Maddox is an 60 y.o. 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  On arrival to the ED his sensorium was altered slurred speech and hypoxic, he was also found to be tachycardic with some redness in his left lower extremity.  With some bloody oozing from staples and fluctuation, lab work showed a mild leukocytosis hemoglobin of 9 new worsening renal function mild lactic acidosis  Patient is medically stable for transfer to skilled nursing facility. Significant Events: 03/15/2021 admitted with shock with a lactate greater than 10 you acute kidney injury suspected cardiogenic. Requires pressors and admission  Significant studies: Blood cultures 03/15/2021>> Urine culture 03/15/2021  negative Renal ultrasound unremarkable  Antibiotics: Vancomycin 03/15/2021>> Zosyn 03/16/2019>>5.23.2022 Rocephin 03/19/2019  Microbiology data: Blood culture:  Procedures: None  Assessment/Plan:   Multifactorial shock suspect most likely cardiogenic versus septic shock with no clear source of infection: He did gain the most benefit from dobutamine and volume resuscitation.  His Co-Ox was about 40. He completed a 5-day course of IV vancomycin and will continue de-escalate Zosyn to Rocephin for total of 7 days. Speech evaluated the patient that deemed a mild risk for aspiration we will continue him on a regular diet. Physical therapy evaluated the patient and recommended skilled nursing facility. Patient is medically  stable to be transferred to skilled nursing facility awaiting bed placement.  High anion gap metabolic acidosis with the lactic acidosis: Suspect likely due to cardiomyopathy, he was also on metformin at home which could be contributing to his lactic acidosis. Continue to hold metformin.  Lactic acidosis has resolved.  Acute metabolic encephalopathy secondary to sepsis present on admission: Continue supportive care minimize sedation.  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 Continue midodrine. Start him on low-dose Lasix he is massively fluid overloaded.  Chronic atrial fibrillation: Rate controlled around 100 on midodrine. Xarelto restarted on 03/17/2021  Acute kidney injury: Likely due to shock. Creatinine around 1 on admission 1.5 is slowly improving today 1.3.  Mild transaminitis: Continues to improve.  Diabetes mellitus type 2 with peripheral vascular disease: His metformin has been discontinued, he is currently on with minimal insulin needs.  Anemia/normocytic anemia: No signs of overt bleeding.  Disposition: Out of bed to chair physical therapy to evaluate.  Will need skilled nursing facility placement.  Goals of care: Patient noncompliant with his medication as exercise, he has poor insight of medical condition, he has end-stage heart failure unfortunately his overall long-term prognosis is very poor. He is not a candidate for aggressive therapy. Will need to have goals of care discussion at facility.  RN Pressure Injury Documentation: Pressure Injury 02/14/21 Sacrum Medial;Upper Deep Tissue Pressure Injury - Purple or maroon localized area of discolored intact skin or blood-filled blister due to damage of underlying soft tissue from pressure and/or shear. (Active)  02/14/21 2030  Location: Sacrum  Location Orientation: Medial;Upper  Staging:  Deep Tissue Pressure Injury - Purple or maroon localized area of discolored  intact skin or blood-filled blister due to damage of underlying soft tissue from pressure and/or shear.  Wound Description (Comments):   Present on Admission: Yes    Estimated body mass index is 29.54 kg/m as calculated from the following:   Height as of this encounter: 6\' 3"  (1.905 m).   Weight as of this encounter: 107.2 kg.  DVT prophylaxis: lovenox Family Communication:none Status is: Inpatient  Remains inpatient appropriate because:Hemodynamically unstable   Dispo: The patient is from: SNF              Anticipated d/c is to: SNF              Patient currently is not medically stable to d/c.   Difficult to place patient No  Code Status:     Code Status Orders  (From admission, onward)         Start     Ordered   03/15/21 0956  Do not attempt resuscitation (DNR)  Continuous       Question Answer Comment  In the event of cardiac or respiratory ARREST Do not call a "code blue"   In the event of cardiac or respiratory ARREST Do not perform Intubation, CPR, defibrillation or ACLS   In the event of cardiac or respiratory ARREST Use medication by any route, position, wound care, and other measures to relive pain and suffering. May use oxygen, suction and manual treatment of airway obstruction as needed for comfort.      03/15/21 1001        Code Status History    Date Active Date Inactive Code Status Order ID Comments User Context   03/15/2021 0807 03/15/2021 1001 DNR 03/17/2021  [id number], DO ED   02/27/2021 1151 03/09/2021 1704 DNR 03/11/2021  [id number], NP Inpatient   02/14/2021 1959 02/27/2021 1151 Full Code 04/29/2021  [id number], MD Inpatient   Advance Care Planning Activity    Advance Directive Documentation   Flowsheet Row Most Recent Value  Type of Advance Directive Out of facility DNR (pink MOST or yellow form)  Pre-existing out of facility DNR order (yellow form or pink  MOST form) --  "MOST" Form in Place? --        IV Access:    Peripheral IV   Procedures and diagnostic studies:   DG Swallowing Func-Speech Pathology  Result Date: 03/19/2021 Objective Swallowing Evaluation: Type of Study: MBS-Modified Barium Swallow Study  Patient Details Name: Fernando Maddox MRN: Fernando Skillern Date of Birth: [birth year] Today's Date: 03/19/2021 Time: SLP Start Time (ACUTE ONLY): 03/21/2021 -SLP Stop Time (ACUTE ONLY): 0850 SLP Time Calculation (min) (ACUTE ONLY): 12 min Past Medical History: Past Medical History: Diagnosis Date . Acute combined systolic and diastolic CHF, NYHA class 1 (HCC) 08/07/2016 . Anemia  . Atrial fibrillation (HCC)  . BACK PAIN 02/11/2010  Qualifier: Diagnosis of  By: 02/13/2010, RN, BSN, Lauren   . CAD (coronary artery disease) 02/11/2010  Qualifier: Diagnosis of  By: 02/13/2010, RN, BSN, Lauren   . Cellulitis 09/29/2017 . Cerebrovascular disease  . CHEST PAIN 02/11/2010  Qualifier: Diagnosis of  By: 02/13/2010, RN, BSN, Lauren   . Chronic anticoagulation 03/25/2018 . Chronic pain syndrome  . Chronic systolic congestive heart failure (HCC) 03/25/2018 . Community acquired pneumonia 08/11/2016 . DEPRESSION/ANXIETY 02/11/2010  Qualifier: Diagnosis of  By: 02/13/2010, RN, BSN, Lauren   . Diabetes mellitus, type 2 (HCC) 02/11/2010  Qualifier: Diagnosis of  By: 02/13/2010, RN, BSN, Lauren   . Diabetic ulcer  of toe of right foot associated with type 2 diabetes mellitus (HCC) 10/01/2017 . ESOPHAGEAL STRICTURE 02/11/2010  Qualifier: Diagnosis of  By: Manson Passey, RN, BSN, Lauren   . GERD (gastroesophageal reflux disease)  . HEADACHE 02/11/2010  Qualifier: Diagnosis of  By: Manson Passey, RN, BSN, Lauren   . History of CVA (cerebrovascular accident) 09/29/2017 . History of drug abuse (HCC) 08/05/2016 . Hypercholesteremia  . Hyperlipemia 02/11/2010  Qualifier: Diagnosis of  By: Manson Passey, RN, BSN, Lauren   . Hyperlipidemia  . Hypertension 02/11/2010  Qualifier: Diagnosis of  By: Manson Passey, RN, BSN, Lauren   . HYPERTENSION, UNSPECIFIED  02/11/2010  Qualifier: Diagnosis of  By: Manson Passey, RN, BSN, Lauren   . Hypogonadism male  . 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 Status post thrombectomy . Osteoarthritis 08/05/2016 . Osteomyelitis, unspecified (HCC) 10/06/2017 . PAF (paroxysmal atrial fibrillation) (HCC) 08/05/2016 . Polysubstance abuse (HCC) 09/29/2017 . Rotator cuff syndrome   Shoulder . Septic shock (HCC) 02/14/2021 . Shoulder pain  . SIADH (syndrome of inappropriate ADH production) (HCC) 08/07/2016 . Sleep apnea  . Stroke (HCC) 08/05/2016 . Tobacco use 08/11/2016 . Ulcer of left foot with necrosis of bone [facility])  Past Surgical History: Past Surgical History: Procedure Laterality Date . AMPUTATION Left 02/20/2021  Procedure: LEFT TRANSMETATARSAL AMPUTATION;  Surgeon: [name], MD;  Location: [facility] OR;  Service: Orthopedics;  Laterality: Left; . AMPUTATION Left 03/06/2021  Procedure: LEFT BELOW KNEE AMPUTATION;  Surgeon: [name], MD;  Location: [facility] OR;  Service: Orthopedics;  Laterality: Left; . NO PAST SURGERIES   HPI: Patient is a 60 y.o. male with PMH: poorly controlled diabetes, HTN, CVAs, atrial fibrilation, osteomyelitis of left LE post BKA, biventricular systolic heart failure, who was brought to ED with AMS and SOB. He was recently admitted to [facility] from [facility] on 4/21 secondary to wound infection of left foot and had been presenting with AMS likely from sepsis. He underwent a left transmetatarsal amputation on 4/27 but then required left BKA on 5/11 and then was discharged to SNF on 5/13. During hospital stay, SLP performed BSE and recommended MBS, however patient was discharged prior to this being scheduled and recommendation was for patient to be followed by ST services at [facility].  Subjective: alert, impulsive, confused Assessment / Plan / Recommendation CHL IP CLINICAL IMPRESSIONS 03/18/2021 Clinical Impression Pt demonstrates adequate oral and  oropahryngeal function. There was an instance of trace penetration without aspiration after taking large rapid sips with a pill, but otherwise strength and timing WNL. Pt without coughing during assessment. No pulse oximetry available during testing, but pts respiratory pattern appeared WNL. Esophageal sweep achieved to mid esopahgus (leg blocking further sweep) appeared WNL though no radiologist was present to confirm. Recommend upgrade to regualr diet and thin liquids. SLP Visit Diagnosis Dysphagia, unspecified (R13.10) Attention and concentration deficit following -- Frontal lobe and executive function deficit following -- Impact on safety and function Mild aspiration risk   CHL IP TREATMENT RECOMMENDATION 03/18/2021 Treatment Recommendations No treatment recommended at this time   Prognosis 03/16/2021 Prognosis for Safe Diet Advancement Good Barriers to Reach Goals -- Barriers/Prognosis Comment -- CHL IP DIET RECOMMENDATION 03/18/2021 SLP Diet Recommendations Regular solids;Thin liquid Liquid Administration via Cup;Straw Medication Administration Whole meds with liquid Compensations Minimize environmental distractions;Slow rate;Small sips/bites Postural Changes Seated upright at 90 degrees   CHL IP OTHER RECOMMENDATIONS 03/16/2021 Recommended Consults -- Oral Care Recommendations -- Other Recommendations Order thickener from pharmacy;Prohibited  food (jello, ice cream, thin soups);Clarify dietary restrictions   CHL IP FOLLOW UP RECOMMENDATIONS 03/18/2021 Follow up Recommendations None   CHL IP FREQUENCY AND DURATION 03/16/2021 Speech Therapy Frequency (ACUTE ONLY) min 2x/week Treatment Duration 1 week      CHL IP ORAL PHASE 03/18/2021 Oral Phase WFL Oral - Pudding Teaspoon -- Oral - Pudding Cup -- Oral - Honey Teaspoon -- Oral - Honey Cup -- Oral - Nectar Teaspoon -- Oral - Nectar Cup -- Oral - Nectar Straw -- Oral - Thin Teaspoon -- Oral - Thin Cup -- Oral - Thin Straw -- Oral - Puree -- Oral - Mech Soft -- Oral -  Regular -- Oral - Multi-Consistency -- Oral - Pill -- Oral Phase - Comment --  CHL IP PHARYNGEAL PHASE 03/18/2021 Pharyngeal Phase WFL Pharyngeal- Pudding Teaspoon -- Pharyngeal -- Pharyngeal- Pudding Cup -- Pharyngeal -- Pharyngeal- Honey Teaspoon -- Pharyngeal -- Pharyngeal- Honey Cup -- Pharyngeal -- Pharyngeal- Nectar Teaspoon -- Pharyngeal -- Pharyngeal- Nectar Cup -- Pharyngeal -- Pharyngeal- Nectar Straw -- Pharyngeal -- Pharyngeal- Thin Teaspoon -- Pharyngeal -- Pharyngeal- Thin Cup -- Pharyngeal -- Pharyngeal- Thin Straw -- Pharyngeal -- Pharyngeal- Puree -- Pharyngeal -- Pharyngeal- Mechanical Soft -- Pharyngeal -- Pharyngeal- Regular -- Pharyngeal -- Pharyngeal- Multi-consistency -- Pharyngeal -- Pharyngeal- Pill -- Pharyngeal -- Pharyngeal Comment --  No flowsheet data found. DeBlois, Riley Nearing 03/19/2021, 7:50 AM                Medical Consultants:    None.   Subjective:    Fernando Maddox no complaints.  Objective:    Vitals:   03/18/21 0957 03/18/21 1627 03/18/21 2042 03/19/21 0600  BP: 107/83 (!) 111/96 (!) 122/95 (!) 118/91  Pulse: 85 (!) 109 (!) 106 (!) 109  Resp: 18 17 17 18   Temp: (!) 97.1 F (36.2 C)  98.6 F (37 C) 98.1 F (36.7 C)  TempSrc:    Axillary  SpO2: 99% 90% 96% 97%  Weight:   107.2 kg   Height:       SpO2: 97 % O2 Flow Rate (L/min): 2 L/min   Intake/Output Summary (Last 24 hours) at 03/19/2021 0857 Last data filed at 03/18/2021 2042 Gross per 24 hour  Intake 1118.24 ml  Output 1750 ml  Net -631.76 ml   Filed Weights   03/15/21 0423 03/16/21 0500 03/18/21 2042  Weight: 107 kg 107.2 kg 107.2 kg    Exam: General exam: In no acute distress. Respiratory system: Good air movement and clear to auscultation. Cardiovascular system: S1 & S2 heard, RRR. No JVD. Gastrointestinal system: Abdomen is nondistended, soft and nontender.  Extremities: No pedal edema. Skin: No rashes, lesions or ulcers Psychiatry: Judgement and insight appear  normal. Mood & affect appropriate.   Data Reviewed:    Labs: Basic Metabolic Panel: Recent Labs  Lab 03/15/21 0730 03/15/21 0828 03/15/21 1810 03/15/21 2313 03/16/21 0600 03/17/21 0448 03/18/21 0615  NA  --    < > 131* 132* 134* 129* 130*  K  --    < > 5.1 4.8 4.3 3.8 3.5  CL  --    < > 99 100 100 97* 96*  CO2  --    < > 18* 22 26 26 24   GLUCOSE  --    < > 177* 190* 154* 137* 113*  BUN  --    < > 34* 36* 36* 31* 26*  CREATININE  --    < > 1.66* 1.56* 1.54* 1.34* 1.11  CALCIUM  --    < > 8.4* 8.3* 8.3* 7.9* 8.0*  MG 2.1  --   --   --  1.9 1.9 1.8  PHOS  --   --   --   --  4.7* 3.1 3.1   < > = values in this interval not displayed.   GFR Estimated Creatinine Clearance: 94.9 mL/min (by C-G formula based on SCr of 1.11 mg/dL). Liver Function Tests: Recent Labs  Lab 03/15/21 1307 03/15/21 1810 03/15/21 2313 03/16/21 0600 03/17/21 0448  AST 128* 164* 152* 115* 75*  ALT 78* 85* 88* 83* 77*  ALKPHOS 100 78 75 66 80  BILITOT 4.8* 2.9* 2.5* 2.2* 2.1*  PROT 7.7 5.7* 5.6* 5.4* 5.8*  ALBUMIN 2.9* 2.2* 2.2* 2.1* 2.1*   Recent Labs  Lab 03/15/21 1106  LIPASE 34   Recent Labs  Lab 03/15/21 0819  AMMONIA <9*   Coagulation profile Recent Labs  Lab 03/15/21 1106 03/16/21 0600  INR 9.3* 3.4*   COVID-19 Labs  No results for input(s): DDIMER, FERRITIN, LDH, CRP in the last 72 hours.  Lab Results  Component Value Date   SARSCOV2NAA NEGATIVE 03/15/2021   SARSCOV2NAA NEGATIVE 03/08/2021   SARSCOV2NAA NEGATIVE 02/25/2021   SARSCOV2NAA NEGATIVE 02/20/2021    CBC: Recent Labs  Lab 03/15/21 0557 03/15/21 0828 03/15/21 1106 03/15/21 1349 03/17/21 0448  WBC 11.9*  --  9.8  --  9.4  NEUTROABS 9.5*  --   --   --   --   HGB 11.2* 6.8* 9.8* 10.9* 8.6*  HCT 38.0* 20.0* 35.2* 32.0* 28.4*  MCV 93.8  --  100.0  --  90.4  PLT 409*  --  269  --  271   Cardiac Enzymes: Recent Labs  Lab 03/15/21 0730  CKTOTAL 113   BNP (last 3 results) No results for input(s):  PROBNP in the last 8760 hours. CBG: Recent Labs  Lab 03/18/21 1555 03/18/21 2040 03/19/21 0006 03/19/21 0444 03/19/21 0733  GLUCAP 178* 126* 204* 148* 116*   D-Dimer: No results for input(s): DDIMER in the last 72 hours. Hgb A1c: No results for input(s): HGBA1C in the last 72 hours. Lipid Profile: No results for input(s): CHOL, HDL, LDLCALC, TRIG, CHOLHDL, LDLDIRECT in the last 72 hours. Thyroid function studies: No results for input(s): TSH, T4TOTAL, T3FREE, THYROIDAB in the last 72 hours.  Invalid input(s): FREET3 Anemia work up: No results for input(s): VITAMINB12, FOLATE, FERRITIN, TIBC, IRON, RETICCTPCT in the last 72 hours. Sepsis Labs: Recent Labs  Lab 03/15/21 0557 03/15/21 0730 03/15/21 1106 03/15/21 1307 03/16/21 0845 03/17/21 0448 03/17/21 0950 03/18/21 0615  WBC 11.9*  --  9.8  --   --  9.4  --   --   LATICACIDVEN >11.0*   < >  --  >11.0* 1.7  --  2.1* 1.4   < > = values in this interval not displayed.   Microbiology Recent Results (from the past 240 hour(s))  Culture, blood (routine x 2)     Status: None (Preliminary result)   Collection Time: 03/15/21  5:38 AM   Specimen: BLOOD  Result Value Ref Range Status   Specimen Description BLOOD RIGHT ANTECUBITAL  Final   Special Requests   Final    BOTTLES DRAWN AEROBIC AND ANAEROBIC Blood Culture adequate volume   Culture   Final    NO GROWTH 4 DAYS Performed at [facility], [address] [place], [place] [address]    Report Status PENDING  Incomplete  Urine culture     Status: None   Collection Time: 03/15/21  5:38 AM   Specimen: Urine, Random  Result Value Ref Range Status   Specimen Description URINE, RANDOM  Final   Special Requests NONE  Final   Culture   Final    NO GROWTH Performed at [facility], [address], [place], [place] [address]    Report Status 03/16/2021 FINAL  Final  Culture, blood (routine x 2)     Status: None (Preliminary result)   Collection Time: 03/15/21   5:43 AM   Specimen: BLOOD  Result Value Ref Range Status   Specimen Description BLOOD BLOOD RIGHT FOREARM  Final   Special Requests   Final    BOTTLES DRAWN AEROBIC AND ANAEROBIC Blood Culture adequate volume   Culture   Final    NO GROWTH 4 DAYS Performed at [facility], [address] [place], [place] [address]    Report Status PENDING  Incomplete  Resp Panel by RT-PCR (Flu A&B, Covid) Nasopharyngeal Swab     Status: None   Collection Time: 03/15/21  7:42 AM   Specimen: Nasopharyngeal Swab; Nasopharyngeal(NP) swabs in vial transport medium  Result Value Ref Range Status   SARS Coronavirus 2 by RT PCR NEGATIVE NEGATIVE Final    Comment: (NOTE) SARS-CoV-2 target nucleic acids are NOT DETECTED.  The SARS-CoV-2 RNA is generally detectable in upper respiratory specimens during the acute phase of infection. The lowest concentration of SARS-CoV-2 viral copies this assay can detect is 138 copies/mL. A negative result does not preclude SARS-Cov-2 infection and should not be used as the sole basis for treatment or other patient management decisions. A negative result may occur with  improper specimen collection/handling, submission of specimen other than nasopharyngeal swab, presence of viral mutation(s) within the areas targeted by this assay, and inadequate number of viral copies(<138 copies/mL). A negative result must be combined with clinical observations, patient history, and epidemiological information. The expected result is Negative.  Fact Sheet for Patients:  BloggerCourse.com  Fact Sheet for Healthcare Providers:  SeriousBroker.it  This test is no t yet approved or cleared by the [place] FDA and  has been authorized for detection and/or diagnosis of SARS-CoV-2 by FDA under an Emergency Use Authorization (EUA). This EUA will remain  in effect (meaning this test can be used) for the duration of the COVID-19  declaration under Section 564(b)(1) of the Act, 21 U.S.C.section 360bbb-3(b)(1), unless the authorization is terminated  or revoked sooner.       Influenza A by PCR NEGATIVE NEGATIVE Final   Influenza B by PCR NEGATIVE NEGATIVE Final    Comment: (NOTE) The Xpert Xpress SARS-CoV-2/FLU/RSV plus assay is intended as an aid in the diagnosis of influenza from Nasopharyngeal swab specimens and should not be used as a sole basis for treatment. Nasal washings and aspirates are unacceptable for Xpert Xpress SARS-CoV-2/FLU/RSV testing.  Fact Sheet for Patients: BloggerCourse.com  Fact Sheet for Healthcare Providers: SeriousBroker.it  This test is not yet approved or cleared by the [place] FDA and has been authorized for detection and/or diagnosis of SARS-CoV-2 by FDA under an Emergency Use Authorization (EUA). This EUA will remain in effect (meaning this test can be used) for the duration of the COVID-19 declaration under Section 564(b)(1) of the Act, 21 U.S.C. section 360bbb-3(b)(1), unless the authorization is terminated or revoked.  Performed at [facility], [address] [place], [place] [address]  MRSA PCR Screening     Status: None   Collection Time: 03/15/21 12:55 PM   Specimen: Nasal Mucosa; Nasopharyngeal  Result Value Ref Range Status   MRSA by PCR NEGATIVE NEGATIVE Final    Comment:        The GeneXpert MRSA Assay (FDA approved for NASAL specimens only), is one component of a comprehensive MRSA colonization surveillance program. It is not intended to diagnose MRSA infection nor to guide or monitor treatment for MRSA infections. Performed at [facility], [address], [place], [place] [address]      Medications:   . Chlorhexidine Gluconate Cloth  6 each Topical Daily  . insulin aspart  0-6 Units Subcutaneous Q4H  . midodrine  5 mg Oral TID WC  . pantoprazole  40 mg Oral Daily  .  rivaroxaban  20 mg Oral Q supper  . thiamine  100 mg Oral Daily  . Thrombi-Pad  1 each Topical Once   Continuous Infusions: . sodium chloride 10 mL/hr at 03/16/21 1800      LOS: 4 days   Marinda Elk  [facility]  03/19/2021, 8:57 AM

## 2021-03-19 NOTE — Consult Note (Signed)
   [facility] CM Inpatient Consult   03/19/2021  EVIE CRUMPLER [birth year] [id number]  [facility] [[facility]]  Accountable Care Organization [ACO] Patient: Medicare CMS DCE  Patient screened for less than 7 days readmission hospitalization with noted extreme high risk score for unplanned readmission risk.  Review of patient's medical record reveals patient is being recommended to return to a skilled nursing facility for post hospital transition of care.   Plan:  Continue to follow progress and disposition to assess for post hospital care management needs. Follow up with [facility] team.  For questions contact:   Charlesetta Shanks, RN BSN CCM [facility]  [phone] business mobile phone Toll free office [phone]  Fax number: [phone] [email or web address]

## 2021-03-19 NOTE — Plan of Care (Signed)
°  Problem: Coping: °Goal: Level of anxiety will decrease °Outcome: Progressing °  °

## 2021-03-20 DIAGNOSIS — R570 Cardiogenic shock: Secondary | ICD-10-CM | POA: Diagnosis not present

## 2021-03-20 DIAGNOSIS — E1165 Type 2 diabetes mellitus with hyperglycemia: Secondary | ICD-10-CM | POA: Diagnosis not present

## 2021-03-20 DIAGNOSIS — Z89512 Acquired absence of left leg below knee: Secondary | ICD-10-CM

## 2021-03-20 DIAGNOSIS — I5042 Chronic combined systolic (congestive) and diastolic (congestive) heart failure: Secondary | ICD-10-CM

## 2021-03-20 DIAGNOSIS — E1140 Type 2 diabetes mellitus with diabetic neuropathy, unspecified: Secondary | ICD-10-CM

## 2021-03-20 DIAGNOSIS — N179 Acute kidney failure, unspecified: Secondary | ICD-10-CM | POA: Diagnosis not present

## 2021-03-20 DIAGNOSIS — R5381 Other malaise: Secondary | ICD-10-CM

## 2021-03-20 DIAGNOSIS — I5082 Biventricular heart failure: Secondary | ICD-10-CM

## 2021-03-20 DIAGNOSIS — G9341 Metabolic encephalopathy: Secondary | ICD-10-CM | POA: Diagnosis not present

## 2021-03-20 LAB — CULTURE, BLOOD (ROUTINE X 2)
Culture: NO GROWTH
Culture: NO GROWTH
Special Requests: ADEQUATE
Special Requests: ADEQUATE

## 2021-03-20 LAB — GLUCOSE, CAPILLARY
Glucose-Capillary: 113 mg/dL — ABNORMAL HIGH (ref 70–99)
Glucose-Capillary: 117 mg/dL — ABNORMAL HIGH (ref 70–99)
Glucose-Capillary: 142 mg/dL — ABNORMAL HIGH (ref 70–99)
Glucose-Capillary: 169 mg/dL — ABNORMAL HIGH (ref 70–99)
Glucose-Capillary: 99 mg/dL (ref 70–99)

## 2021-03-20 MED ORDER — INSULIN ASPART 100 UNIT/ML IJ SOLN: 0.0000 [IU] | Freq: Every day | INTRAMUSCULAR | 11 refills | Status: DC

## 2021-03-20 MED ORDER — INSULIN ASPART 100 UNIT/ML IJ SOLN
0.0000 [IU] | Freq: Every day | INTRAMUSCULAR | Status: DC
  Administered 2021-03-22: 3 [IU] via SUBCUTANEOUS

## 2021-03-20 MED ORDER — FUROSEMIDE 20 MG PO TABS: 20.0000 mg | ORAL_TABLET | Freq: Two times a day (BID) | ORAL | 1 refills | Status: DC

## 2021-03-20 MED ORDER — INSULIN ASPART 100 UNIT/ML IJ SOLN: 1.0000 [IU] | Freq: Three times a day (TID) | INTRAMUSCULAR | 11 refills | Status: DC

## 2021-03-20 MED ORDER — THIAMINE HCL 100 MG PO TABS: 100.0000 mg | ORAL_TABLET | Freq: Every day | ORAL | Status: DC

## 2021-03-20 MED ORDER — COVID-19 MRNA VACC (MODERNA) 50 MCG/0.25ML IM SUSP: 0.2500 mL | Freq: Once | INTRAMUSCULAR | Status: DC

## 2021-03-20 MED ORDER — OXYCODONE-ACETAMINOPHEN 5-325 MG PO TABS: 1.0000 | ORAL_TABLET | Freq: Three times a day (TID) | ORAL | 0 refills | Status: AC | PRN

## 2021-03-20 MED ORDER — INSULIN ASPART 100 UNIT/ML IJ SOLN
0.0000 [IU] | Freq: Three times a day (TID) | INTRAMUSCULAR | Status: DC
  Administered 2021-03-21: 2 [IU] via SUBCUTANEOUS
  Administered 2021-03-21: 1 [IU] via SUBCUTANEOUS
  Administered 2021-03-22: 3 [IU] via SUBCUTANEOUS

## 2021-03-20 NOTE — [place] FL2 (Signed)
[place] MEDICAID FL2 LEVEL OF CARE SCREENING TOOL     IDENTIFICATION  Patient Name: Fernando Maddox Birthdate: 06/16/1961 Sex: male Admission Date (Current Location): 03/15/2021  [place] and [place] Number:  [place] (Patient from [place], [place] - [facility])   Facility and Address:  [facility], [address], [place], [place] [address]      Provider Number: [id number]  Attending Physician Name and Address:  Almon Hercules, MD  Relative Name and Phone Number:  Reita Cliche and Rama [facility] - parents. Home number: [phone]; Cell number [phone]    Current Level of Care: Hospital Recommended Level of Care: Skilled Nursing Facility Prior Approval Number:    Date Approved/Denied:   PASRR Number: [id number]  Discharge Plan: SNF    Current Diagnoses: Patient Active Problem List   Diagnosis Date Noted  . Acute metabolic encephalopathy 03/18/2021  . AKI (acute kidney injury) (HCC) 03/18/2021  . Transaminitis 03/18/2021  . Dehiscence of amputation stump (HCC)   . Foot ulcer (HCC)   . Acute combined systolic and diastolic congestive heart failure (HCC)   . Hyponatremia   . Ulcer of left foot with necrosis of bone (HCC)   . Septic shock (HCC) 02/14/2021  . Atrial fibrillation (HCC)   . Cerebrovascular disease   . Chronic pain syndrome   . Hyperlipidemia   . Shoulder pain   . Normocytic anemia   . GERD (gastroesophageal reflux disease)   . Hypercholesteremia   . Hypogonadism male   . Rotator cuff syndrome   . Sleep apnea   . Chronic anticoagulation 03/25/2018  . Chronic systolic congestive heart failure (HCC) 03/25/2018  . Osteomyelitis, unspecified (HCC) 10/06/2017  . Diabetic ulcer of toe of right foot associated with type 2 diabetes mellitus (HCC) 10/01/2017  . Cellulitis 09/29/2017  . History of CVA (cerebrovascular accident) 09/29/2017  . Polysubstance abuse (HCC) 09/29/2017  . 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   . Community acquired pneumonia 08/11/2016  . Tobacco use 08/11/2016  . Acute combined systolic and diastolic CHF, NYHA class 1 (HCC) 08/07/2016  . 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 History of drug abuse (HCC) 08/05/2016  . Osteoarthritis 08/05/2016  . PAF (paroxysmal atrial fibrillation) (HCC) 08/05/2016  . Stroke (HCC) 08/05/2016  . Diabetes mellitus, type 2 (HCC) 02/11/2010  . Hyperlipemia 02/11/2010  . HYPERTENSION, UNSPECIFIED 02/11/2010  . CAD (coronary artery disease) 02/11/2010  . Hypertension 02/11/2010  . ESOPHAGEAL STRICTURE 02/11/2010  . BACK PAIN 02/11/2010  . HEADACHE 02/11/2010  . CHEST PAIN 02/11/2010  . DEPRESSION/ANXIETY 02/11/2010    Orientation RESPIRATION BLADDER Height & Weight     Self,Situation,Place  Normal Continent,External catheter (External catheter placed 5/20) Weight: 265 lb 3.4 oz (120.3 kg) Height:  6\' 3"  (190.5 cm)  BEHAVIORAL SYMPTOMS/MOOD NEUROLOGICAL BOWEL NUTRITION STATUS      Incontinent Diet (Regular diet with 500 mL fluid restriction)  AMBULATORY STATUS COMMUNICATION OF NEEDS Skin   Total Care (Patient was unable to ambulate with PT due to safety concerns) Verbally Other (Comment),Surgical wounds (Pressure injury medial sacrum; Incision left lower leg; Ecchymosis bilateral arms; Excoriations arm, hand, leg; RLE anputated toes; New left BKA)                       Personal Care Assistance Level of Assistance  Bathing,Feeding,Dressing Bathing Assistance: Limited assistance Feeding assistance: Independent Dressing Assistance: Limited assistance     Functional Limitations Info  Sight,Hearing,Speech Sight Info:  Adequate Hearing Info: Adequate Speech Info: Adequate    SPECIAL CARE FACTORS FREQUENCY  PT (By licensed PT)     PT Frequency: Evaluated 5/23. PT at SNF eval and treat, a minimum of 5 days per week OT Frequency: OT at SNF eval and treat, a minimum of 5 days per week             Contractures Contractures Info: Not present    Additional Factors Info  Code Status,Insulin Sliding Scale Code Status Info: DNR Allergies Info: : Codeine, Bee Venom, Hydrocodone-acetaminophen, Cephalexin, Vicodin (Hydrocodone-acetaminophen), Zocor (Simvastatin)   Insulin Sliding Scale Info: 0-5 Units three times per day with meals. 0-5 Units daily at bedtime       Current Medications (03/20/2021):  This is the current hospital active medication list Current Facility-Administered Medications  Medication Dose Route Frequency Provider Last Rate Last Admin  . 0.9 %  sodium chloride infusion  250 mL Intravenous Continuous Simonne Martinet, NP 10 mL/hr at 03/16/21 1800 Infusion Verify at 03/16/21 1800  . Chlorhexidine Gluconate Cloth 2 % PADS 6 each  6 each Topical Daily Cheri Fowler, MD   6 each at 03/20/21 0850  . food thickener (SIMPLYTHICK (NECTAR/LEVEL 2/MILDLY THICK)) 1 packet  1 packet Oral PRN Leslye Peer, MD      . furosemide (LASIX) tablet 20 mg  20 mg Oral BID Marinda Elk, MD   20 mg at 03/20/21 0850  . insulin aspart (novoLOG) injection 0-5 Units  0-5 Units Subcutaneous QHS Gonfa, Taye T, MD      . insulin aspart (novoLOG) injection 0-6 Units  0-6 Units Subcutaneous TID WC Gonfa, Taye T, MD      . midodrine (PROAMATINE) tablet 5 mg  5 mg Oral TID WC Marinda Elk, MD   5 mg at 03/20/21 1115  . ondansetron (ZOFRAN) injection 4 mg  4 mg Intravenous Q6H PRN Simonne Martinet, NP   4 mg at 03/15/21 1031  . oxyCODONE (Oxy IR/ROXICODONE) immediate release tablet 5 mg  5 mg Oral Q6H PRN Leslye Peer, MD   5 mg at 03/20/21 0850  . pantoprazole (PROTONIX) EC tablet 40 mg  40 mg Oral Daily Pham, Minh Q, RPH-CPP   40 mg at 03/20/21 0850  . rivaroxaban (XARELTO) tablet 20 mg  20 mg Oral Q supper Leslye Peer, MD   20 mg at 03/19/21 1647  . thiamine tablet 100 mg  100 mg Oral Daily Pham, Minh Q, RPH-CPP   100 mg at 03/20/21 0850  . Thrombi-Pad 3"X3" pad 1 each  1  each Topical Once Delfin Gant, NP         Discharge Medications: Please see discharge summary for a list of discharge medications.  Relevant Imaging Results:  Relevant Lab Results:   Additional Information ss#[id number]. Patient has had 2 [place] vaccinations  Okey Dupre Lazaro Arms, LCSW

## 2021-03-20 NOTE — Plan of Care (Signed)
  Problem: Clinical Measurements: Goal: Diagnostic test results will improve Outcome: Completed/Met

## 2021-03-20 NOTE — TOC Initial Note (Signed)
Transition of Care [facility]) - Initial/Assessment Note    Patient Details  Name: Fernando Maddox MRN: [id number] Date of Birth: [birth year]  [facility] CM/SW Contact:    Cristobal Goldmann, LCSW Phone Number: 03/20/2021, 4:07 PM  Clinical Narrative:  CSW talked with patient and his parents, Rama and Majesty Oehlert at bedside regarding his discharge disposition. Patient is from [name] and was asked if he planned to return there and was informed by his parents that they would like him closer to their home - somewhere in [place] or [place] and when asked, patient was in agreement. Patient agreeable to CSW contacting his parents and CSW advised that they don't have cell service where they live, so call their home number if they don't answer if the mobile number is called.   When asked, patient reported that he has been vaccinated and was informed that he has had the 2 Moderna vaccinations, but has not had the booster. When asked, patient was agreeable to getting the booster in the hospital if he can. Mr. Adria Dill dad reported that he took the card to the nursing facility patient went to for them to make a copy.                 Expected Discharge Plan: Skilled Nursing Facility Barriers to Discharge: SNF Pending bed offer   Patient Goals and CMS Choice Patient states their goals for this hospitalization and ongoing recovery are:: Patient agreeable to continuing ST rehab at discharge from hospital CMS Medicare.gov Compare Post Acute Care list provided to:: Other (Comment Required) (Patient advised by patient and his parents that they are intersted in [place] couniies for SNF placement) Choice offered to / list presented to : Patient,Parent (Choice discussed with patient and parents)  Expected Discharge Plan and Services Expected Discharge Plan: Skilled Nursing Facility In-house Referral: Clinical Social Work   Post Acute Care Choice: Skilled Nursing  Facility Living arrangements for the past 2 months: Skilled Nursing Facility (Patient came to hospital from [name]. Prior to that he lived at home) Expected Discharge Date: 03/20/21                                    Prior Living Arrangements/Services Living arrangements for the past 2 months: Skilled Nursing Facility (Patient came to hospital from [facility]. Prior to that he lived at home) Lives with:: Self Patient language and need for interpreter reviewed:: No Do you feel safe going back to the place where you live?: No   Patient agreeable to continuing ST rehab  Need for Family Participation in Patient Care: Yes (Comment) Care giver support system in place?: Yes (comment)   Criminal Activity/Legal Involvement Pertinent to Current Situation/Hospitalization: No - Comment as needed  Activities of Daily Living      Permission Sought/Granted Permission sought to share information with : Family Supports Permission granted to share information with : Yes, Verbal Permission Granted  Share Information with NAME: Reita Cliche and Rama [employer]  Permission granted to share info w AGENCY: SNF's  Permission granted to share info w Relationship: Parents  Permission granted to share info w Contact Information: [phone] (home) and [phone] (mobile)  Emotional Assessment Appearance:: Appears stated age Attitude/Demeanor/Rapport: Engaged Affect (typically observed): Appropriate Orientation: : Oriented to Self,Oriented to  Time,Oriented to Situation Alcohol / Substance Use: Tobacco Use,Alcohol Use,Illicit Drugs (Per H&P patient quit smoking. No reference  to alcohol or drugs) Psych Involvement: No (comment)  Admission diagnosis:  Acute renal failure (HCC) [N17.9] Septic shock (HCC) [A41.9, R65.21] Patient Active Problem List   Diagnosis Date Noted  . Acute metabolic encephalopathy 03/18/2021  . AKI (acute kidney injury) (HCC) 03/18/2021  . Transaminitis 03/18/2021  .  Dehiscence of amputation stump (HCC)   . Foot ulcer (HCC)   . Acute combined systolic and diastolic congestive heart failure (HCC)   . Hyponatremia   . Ulcer of left foot with necrosis of bone (HCC)   . Septic shock (HCC) 02/14/2021  . Atrial fibrillation (HCC)   . Cerebrovascular disease   . Chronic pain syndrome   . Hyperlipidemia   . Shoulder pain   . Normocytic anemia   . GERD (gastroesophageal reflux disease)   . Hypercholesteremia   . Hypogonadism male   . Rotator cuff syndrome   . Sleep apnea   . Chronic anticoagulation 03/25/2018  . Chronic systolic congestive heart failure (HCC) 03/25/2018  . Osteomyelitis, unspecified (HCC) 10/06/2017  . Diabetic ulcer of toe of right foot associated with type 2 diabetes mellitus (HCC) 10/01/2017  . Cellulitis 09/29/2017  . History of CVA (cerebrovascular accident) 09/29/2017  . Polysubstance abuse (HCC) 09/29/2017  . 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 Community acquired pneumonia 08/11/2016  . Tobacco use 08/11/2016  . Acute combined systolic and diastolic CHF, NYHA class 1 (HCC) 08/07/2016  . 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 History of drug abuse (HCC) 08/05/2016  . Osteoarthritis 08/05/2016  . PAF (paroxysmal atrial fibrillation) (HCC) 08/05/2016  . Stroke (HCC) 08/05/2016  . Diabetes mellitus, type 2 (HCC) 02/11/2010  . Hyperlipemia 02/11/2010  . HYPERTENSION, UNSPECIFIED 02/11/2010  . CAD (coronary artery disease) 02/11/2010  . Hypertension 02/11/2010  . ESOPHAGEAL STRICTURE 02/11/2010  . BACK PAIN 02/11/2010  . HEADACHE 02/11/2010  . CHEST PAIN 02/11/2010  . DEPRESSION/ANXIETY 02/11/2010   PCP:  Crist Fat, MD Pharmacy:   [facility]  - [place], [place] - [address] [place] [address] Phone: [phone] Fax: [phone]     Social Determinants of Health (SDOH) Interventions  No SDOH interventions requested  or needed at this time  Readmission Risk Interventions No flowsheet data found.

## 2021-03-20 NOTE — Progress Notes (Signed)
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  Patient improved with IV pressor (dobutamine), volume resuscitation and broad-spectrum antibiotics.  Currently, patient is stable for discharge to SNF but waiting on bed.  Subjective: Seen and examined earlier this morning.  No major events overnight of this morning.  He reports shortness of breath and pain in left BKA.  He rates pain 8-9 on a scale of 10 but does not seem to be in that much distress.  He denies chest pain or GI symptoms.  Objective: Vitals:   03/19/21 2026 03/20/21 0433 03/20/21 0435 03/20/21 1003  BP: 125/88 124/86  106/64  Pulse: 82 99 88 (!) 55  Resp: 20 20 20 17   Temp: 98.1 F (36.7 C) 97.6 F (36.4 C)  98.3 F (36.8 C)  TempSrc: Oral Oral    SpO2: 100% (!) 86% 94% 95%  Weight: 120.3 kg     Height:        Intake/Output Summary (Last 24 hours) at 03/20/2021 1503 Last data filed at 03/20/2021 1300 Gross per 24 hour  Intake 1260 ml  Output 1850 ml  Net -590 ml   Filed Weights   03/16/21 0500 03/18/21 2042 03/19/21 2026  Weight: 107.2 kg 107.2 kg 120.3 kg    Examination:  GENERAL: No apparent distress.  Nontoxic. HEENT: MMM.  Vision and hearing grossly intact.  NECK: Supple.  No apparent JVD.  RESP: On RA.  No IWOB.  Fair aeration bilaterally. CVS:  RRR. Heart sounds normal.  ABD/GI/GU: BS+. Abd soft, NTND.  MSK/EXT: Left BKA wound appears clean. Staples in place.  2+ RLE edema SKIN: no apparent skin lesion or wound NEURO: Awake, alert and  oriented fairly.  No apparent focal neuro deficit. PSYCH: Calm. Normal affect.   Procedures:  None  Microbiology summarized: COVID-19 and influenza PCR nonreactive. MRSA PCR screen negative. Urine culture NGTD Blood culture NGTD  Assessment & Plan: Cardiogenic versus septic shock-no clear source of infection.  Resolved with IV dobutamine and volume resuscitation.  Completed 5 days of broad-spectrum antibiotics.  -Continue on p.o. midodrine for BP support  High anion gap metabolic acidosis with the lactic acidosis: Likely from hypoperfusion in the setting of the above, and metformin.  Resolved -Discontinue metformin on discharge  Acute metabolic encephalopathy: Likely due to #1.  Resolved. -Avoid or minimize sedating medications -Reorientation and delirium precautions  Chronic biventricular heart failure/chronic combined CHF: TTE on 02/15/2021 with LVEF of 20 to 25%, GH, severely reduced RVSF, moderate RAE, LAE.  On p.o. Lasix at home.  Had 2.4 L UOP/24 hours.  Net +2.6 L.  Renal function stable.  Still with 2+ pitting RLE edema -Continue p.o. Lasix 20 mg twice daily -Closely monitor fluid status -No GDMT due to soft blood pressures.  Resume midodrine for BP support -Overall, poor prognosis  Chronic atrial fibrillation: Overall, rate controlled without medications. -May start low-dose metoprolol if BP stable -Continue Xarelto -Optimize electrolytes  Acute kidney injury/azotemia: Resolved. Recent Labs    03/08/21 0538 03/09/21 0226 03/09/21 2336 03/15/21 0557 03/15/21 1106 03/15/21 1307 03/15/21  1810 03/15/21 2313 03/16/21 0600 03/17/21 0448 03/18/21 0615  BUN 26* 29* 31* 28*  --  31* 34* 36* 36* 31* 26*  CREATININE 1.15 1.08 1.04 1.61* 1.50* 1.73* 1.66* 1.56* 1.54* 1.34* 1.11   Mild transaminitis/hyperbilirubinemia: Likely due to congestive hepatopathy.  Improving Recent Labs  Lab 03/15/21 1307 03/15/21 1810 03/15/21 2313 03/16/21 0600 03/17/21 0448  AST  128* 164* 152* 115* 75*  ALT 78* 85* 88* 83* 77*  ALKPHOS 100 78 75 66 80  BILITOT 4.8* 2.9* 2.5* 2.2* 2.1*  PROT 7.7 5.7* 5.6* 5.4* 5.8*  ALBUMIN 2.9* 2.2* 2.2* 2.1* 2.1*  -Continue monitoring  IDDM-2 with neuropathy Recent Labs  Lab 03/19/21 1609 03/19/21 2023 03/20/21 0007 03/20/21 0404 03/20/21 1126  GLUCAP 134* 174* 142* 99 117*  -Change SSI to ACHS  Anemia/normocytic anemia:  Recent Labs    03/06/21 0050 03/07/21 0047 03/08/21 0538 03/09/21 0226 03/09/21 2336 03/15/21 0557 03/15/21 0828 03/15/21 1106 03/15/21 1349 03/17/21 0448  HGB 10.5* 10.8* 9.8* 9.5* 9.9* 11.2* 6.8* 9.8* 10.9* 8.6*  -Monitor intermittently  Left foot osteomyelitis s/p left BKA: BKA wound appears stable. Staples in place Debility/physical deconditioning -Continue gabapentin and as needed oxycodone for pain -Outpatient follow-up with Dr. Wendi Snipes in about 2 weeks per Dr. Ophelia Charter -Continue PT/OT  Anxiety and depression: Stable.  It seems he was recently started on Cymbalta but did not start taking.  Goals of care: Patient with end-stage heart disease and poor long-term prognosis.  Appropriately DNR/DNI. -Needs palliative follow-up at SNF  Body mass index is 33.15 kg/m.       Pressure Injury 02/14/21 Sacrum Medial;Upper Deep Tissue Pressure Injury - Purple or maroon localized area of discolored intact skin or blood-filled blister due to damage of underlying soft tissue from pressure and/or shear. (Active)  02/14/21 2030  Location: Sacrum  Location Orientation: Medial;Upper  Staging: Deep Tissue Pressure Injury - Purple or maroon localized area of discolored intact skin or blood-filled blister due to damage of underlying soft tissue from pressure and/or shear.  Wound Description (Comments):   Present on Admission: Yes   DVT prophylaxis:  rivaroxaban (XARELTO) tablet 20 mg Start: 03/17/21 1700 Place and maintain sequential compression device Start: 03/15/21 1008 rivaroxaban (XARELTO)  tablet 20 mg  Code Status: DNR/DNI Family Communication: Patient and/or RN. Available if any question.  Level of care: Telemetry Cardiac Status is: Inpatient  Remains inpatient appropriate because:Unsafe d/c plan   Dispo: The patient is from: SNF              Anticipated d/c is to: SNF              Patient currently is medically stable to d/c.   Difficult to place patient No       Consultants:  PCCM Orthopedic surgery   Sch Meds:  Scheduled Meds: . Chlorhexidine Gluconate Cloth  6 each Topical Daily  . furosemide  20 mg Oral BID  . insulin aspart  0-5 Units Subcutaneous QHS  . insulin aspart  0-6 Units Subcutaneous TID WC  . midodrine  5 mg Oral TID WC  . pantoprazole  40 mg Oral Daily  . rivaroxaban  20 mg Oral Q supper  . thiamine  100 mg Oral Daily  . Thrombi-Pad  1 each Topical Once   Continuous Infusions: . sodium chloride 10 mL/hr at 03/16/21 1800   PRN Meds:.food thickener, ondansetron (ZOFRAN) IV, oxyCODONE  Antimicrobials: Anti-infectives (From admission, onward)   Start     Dose/Rate Route Frequency  Ordered Stop   03/18/21 0900  cefTRIAXone (ROCEPHIN) 2 g in sodium chloride 0.9 % 100 mL IVPB        2 g 200 mL/hr over 30 Minutes Intravenous Every 24 hours 03/18/21 0808 03/19/21 0832   03/15/21 2100  vancomycin (VANCOREADY) IVPB 1000 mg/200 mL  Status:  Discontinued        1,000 mg 200 mL/hr over 60 Minutes Intravenous Every 12 hours 03/15/21 0748 03/17/21 1138   03/15/21 1400  piperacillin-tazobactam (ZOSYN) IVPB 3.375 g  Status:  Discontinued        3.375 g 12.5 mL/hr over 240 Minutes Intravenous Every 8 hours 03/15/21 1009 03/18/21 0808   03/15/21 1015  piperacillin-tazobactam (ZOSYN) IVPB 3.375 g  Status:  Discontinued        3.375 g 100 mL/hr over 30 Minutes Intravenous  Once 03/15/21 1004 03/15/21 1008   03/15/21 0800  vancomycin (VANCOCIN) 2,500 mg in sodium chloride 0.9 % 500 mL IVPB        2,500 mg 250 mL/hr over 120 Minutes Intravenous  Once  03/15/21 0748 03/15/21 1144   03/15/21 0745  piperacillin-tazobactam (ZOSYN) IVPB 2.25 g  Status:  Discontinued        2.25 g 100 mL/hr over 30 Minutes Intravenous  Once 03/15/21 0736 03/15/21 0741   03/15/21 0745  piperacillin-tazobactam (ZOSYN) IVPB 3.375 g        3.375 g 100 mL/hr over 30 Minutes Intravenous  Once 03/15/21 0741 03/15/21 0820       I have personally reviewed the following labs and images: CBC: Recent Labs  Lab 03/15/21 0557 03/15/21 0828 03/15/21 1106 03/15/21 1349 03/17/21 0448  WBC 11.9*  --  9.8  --  9.4  NEUTROABS 9.5*  --   --   --   --   HGB 11.2* 6.8* 9.8* 10.9* 8.6*  HCT 38.0* 20.0* 35.2* 32.0* 28.4*  MCV 93.8  --  100.0  --  90.4  PLT 409*  --  269  --  271   BMP &GFR Recent Labs  Lab 03/15/21 0730 03/15/21 0828 03/15/21 1810 03/15/21 2313 03/16/21 0600 03/17/21 0448 03/18/21 0615  NA  --    < > 131* 132* 134* 129* 130*  K  --    < > 5.1 4.8 4.3 3.8 3.5  CL  --    < > 99 100 100 97* 96*  CO2  --    < > 18* 22 26 26 24   GLUCOSE  --    < > 177* 190* 154* 137* 113*  BUN  --    < > 34* 36* 36* 31* 26*  CREATININE  --    < > 1.66* 1.56* 1.54* 1.34* 1.11  CALCIUM  --    < > 8.4* 8.3* 8.3* 7.9* 8.0*  MG 2.1  --   --   --  1.9 1.9 1.8  PHOS  --   --   --   --  4.7* 3.1 3.1   < > = values in this interval not displayed.   Estimated Creatinine Clearance: 100.1 mL/min (by C-G formula based on SCr of 1.11 mg/dL). Liver & Pancreas: Recent Labs  Lab 03/15/21 1307 03/15/21 1810 03/15/21 2313 03/16/21 0600 03/17/21 0448  AST 128* 164* 152* 115* 75*  ALT 78* 85* 88* 83* 77*  ALKPHOS 100 78 75 66 80  BILITOT 4.8* 2.9* 2.5* 2.2* 2.1*  PROT 7.7 5.7* 5.6* 5.4* 5.8*  ALBUMIN 2.9* 2.2* 2.2* 2.1* 2.1*  Recent Labs  Lab 03/15/21 1106  LIPASE 34   Recent Labs  Lab 03/15/21 0819  AMMONIA <9*   Diabetic: No results for input(s): HGBA1C in the last 72 hours. Recent Labs  Lab 03/19/21 1609 03/19/21 2023 03/20/21 0007 03/20/21 0404  03/20/21 1126  GLUCAP 134* 174* 142* 99 117*   Cardiac Enzymes: Recent Labs  Lab 03/15/21 0730  CKTOTAL 113   No results for input(s): PROBNP in the last 8760 hours. Coagulation Profile: Recent Labs  Lab 03/15/21 1106 03/16/21 0600  INR 9.3* 3.4*   Thyroid Function Tests: No results for input(s): TSH, T4TOTAL, FREET4, T3FREE, THYROIDAB in the last 72 hours. Lipid Profile: No results for input(s): CHOL, HDL, LDLCALC, TRIG, CHOLHDL, LDLDIRECT in the last 72 hours. Anemia Panel: No results for input(s): VITAMINB12, FOLATE, FERRITIN, TIBC, IRON, RETICCTPCT in the last 72 hours. Urine analysis:    Component Value Date/Time   COLORURINE AMBER (A) 03/15/2021 0538   APPEARANCEUR TURBID (A) 03/15/2021 0538   LABSPEC >1.030 (H) 03/15/2021 0538   PHURINE 5.0 03/15/2021 0538   GLUCOSEU NEGATIVE 03/15/2021 0538   HGBUR LARGE (A) 03/15/2021 0538   BILIRUBINUR MODERATE (A) 03/15/2021 0538   KETONESUR NEGATIVE 03/15/2021 0538   PROTEINUR >300 (A) 03/15/2021 0538   NITRITE NEGATIVE 03/15/2021 0538   LEUKOCYTESUR NEGATIVE 03/15/2021 0538   Sepsis Labs: Invalid input(s): PROCALCITONIN, LACTICIDVEN  Microbiology: Recent Results (from the past 240 hour(s))  Culture, blood (routine x 2)     Status: None   Collection Time: 03/15/21  5:38 AM   Specimen: BLOOD  Result Value Ref Range Status   Specimen Description BLOOD RIGHT ANTECUBITAL  Final   Special Requests   Final    BOTTLES DRAWN AEROBIC AND ANAEROBIC Blood Culture adequate volume   Culture   Final    NO GROWTH 5 DAYS Performed at [facility], [address], [place], [place] [address]    Report Status 03/20/2021 FINAL  Final  Urine culture     Status: None   Collection Time: 03/15/21  5:38 AM   Specimen: Urine, Random  Result Value Ref Range Status   Specimen Description URINE, RANDOM  Final   Special Requests NONE  Final   Culture   Final    NO GROWTH Performed at [facility], [address],  [place], [place] [address]    Report Status 03/16/2021 FINAL  Final  Culture, blood (routine x 2)     Status: None   Collection Time: 03/15/21  5:43 AM   Specimen: BLOOD  Result Value Ref Range Status   Specimen Description BLOOD BLOOD RIGHT FOREARM  Final   Special Requests   Final    BOTTLES DRAWN AEROBIC AND ANAEROBIC Blood Culture adequate volume   Culture   Final    NO GROWTH 5 DAYS Performed at [facility], [address], [place], [place] [address]    Report Status 03/20/2021 FINAL  Final  Resp Panel by RT-PCR (Flu A&B, Covid) Nasopharyngeal Swab     Status: None   Collection Time: 03/15/21  7:42 AM   Specimen: Nasopharyngeal Swab; Nasopharyngeal(NP) swabs in vial transport medium  Result Value Ref Range Status   SARS Coronavirus 2 by RT PCR NEGATIVE NEGATIVE Final    Comment: (NOTE) SARS-CoV-2 target nucleic acids are NOT DETECTED.  The SARS-CoV-2 RNA is generally detectable in upper respiratory specimens during the acute phase of infection. The lowest concentration of SARS-CoV-2 viral copies this assay can detect  is 138 copies/mL. A negative result does not preclude SARS-Cov-2 infection and should not be used as the sole basis for treatment or other patient management decisions. A negative result may occur with  improper specimen collection/handling, submission of specimen other than nasopharyngeal swab, presence of viral mutation(s) within the areas targeted by this assay, and inadequate number of viral copies(<138 copies/mL). A negative result must be combined with clinical observations, patient history, and epidemiological information. The expected result is Negative.  Fact Sheet for Patients:  BloggerCourse.com  Fact Sheet for Healthcare Providers:  SeriousBroker.it  This test is no t yet approved or cleared by the [place] FDA and  has been authorized for detection and/or diagnosis of SARS-CoV-2 by FDA  under an Emergency Use Authorization (EUA). This EUA will remain  in effect (meaning this test can be used) for the duration of the COVID-19 declaration under Section 564(b)(1) of the Act, 21 U.S.C.section 360bbb-3(b)(1), unless the authorization is terminated  or revoked sooner.       Influenza A by PCR NEGATIVE NEGATIVE Final   Influenza B by PCR NEGATIVE NEGATIVE Final    Comment: (NOTE) The Xpert Xpress SARS-CoV-2/FLU/RSV plus assay is intended as an aid in the diagnosis of influenza from Nasopharyngeal swab specimens and should not be used as a sole basis for treatment. Nasal washings and aspirates are unacceptable for Xpert Xpress SARS-CoV-2/FLU/RSV testing.  Fact Sheet for Patients: BloggerCourse.com  Fact Sheet for Healthcare Providers: SeriousBroker.it  This test is not yet approved or cleared by the [place] FDA and has been authorized for detection and/or diagnosis of SARS-CoV-2 by FDA under an Emergency Use Authorization (EUA). This EUA will remain in effect (meaning this test can be used) for the duration of the COVID-19 declaration under Section 564(b)(1) of the Act, 21 U.S.C. section 360bbb-3(b)(1), unless the authorization is terminated or revoked.  Performed at [facility], [address], [place], [place] [address]   MRSA PCR Screening     Status: None   Collection Time: 03/15/21 12:55 PM   Specimen: Nasal Mucosa; Nasopharyngeal  Result Value Ref Range Status   MRSA by PCR NEGATIVE NEGATIVE Final    Comment:        The GeneXpert MRSA Assay (FDA approved for NASAL specimens only), is one component of a comprehensive MRSA colonization surveillance program. It is not intended to diagnose MRSA infection nor to guide or monitor treatment for MRSA infections. Performed at [facility], [address], [place], [place] [address]     Radiology Studies: No results found.    Tyera Hansley T.  Peityn Payton [facility]  If 7PM-7AM, please contact night-coverage [email or web address] 03/20/2021, 3:03 PM

## 2021-03-20 NOTE — Plan of Care (Signed)
  Problem: Education: Goal: Knowledge of General Education information will improve Description: Including pain rating scale, medication(s)/side effects and non-pharmacologic comfort measures Outcome: Adequate for Discharge   Problem: Health Behavior/Discharge Planning: Goal: Ability to manage health-related needs will improve Outcome: Adequate for Discharge   Problem: Nutrition: Goal: Adequate nutrition will be maintained Outcome: Adequate for Discharge   Problem: Coping: Goal: Level of anxiety will decrease Outcome: Adequate for Discharge   Problem: Elimination: Goal: Will not experience complications related to bowel motility Outcome: Adequate for Discharge Goal: Will not experience complications related to urinary retention Outcome: Adequate for Discharge

## 2021-03-21 DIAGNOSIS — E876 Hypokalemia: Secondary | ICD-10-CM

## 2021-03-21 DIAGNOSIS — E1165 Type 2 diabetes mellitus with hyperglycemia: Secondary | ICD-10-CM | POA: Diagnosis not present

## 2021-03-21 DIAGNOSIS — G9341 Metabolic encephalopathy: Secondary | ICD-10-CM | POA: Diagnosis not present

## 2021-03-21 DIAGNOSIS — E871 Hypo-osmolality and hyponatremia: Secondary | ICD-10-CM

## 2021-03-21 DIAGNOSIS — R570 Cardiogenic shock: Secondary | ICD-10-CM | POA: Diagnosis not present

## 2021-03-21 DIAGNOSIS — N179 Acute kidney failure, unspecified: Secondary | ICD-10-CM | POA: Diagnosis not present

## 2021-03-21 LAB — CBC
HCT: 29.8 % — ABNORMAL LOW (ref 39.0–52.0)
Hemoglobin: 9.5 g/dL — ABNORMAL LOW (ref 13.0–17.0)
MCH: 27.2 pg (ref 26.0–34.0)
MCHC: 31.9 g/dL (ref 30.0–36.0)
MCV: 85.4 fL (ref 80.0–100.0)
Platelets: 288 10*3/uL (ref 150–400)
RBC: 3.49 MIL/uL — ABNORMAL LOW (ref 4.22–5.81)
RDW: 19.8 % — ABNORMAL HIGH (ref 11.5–15.5)
WBC: 8.2 10*3/uL (ref 4.0–10.5)
nRBC: 0 % (ref 0.0–0.2)

## 2021-03-21 LAB — BRAIN NATRIURETIC PEPTIDE: B Natriuretic Peptide: 1479.1 pg/mL — ABNORMAL HIGH (ref 0.0–100.0)

## 2021-03-21 LAB — GLUCOSE, CAPILLARY
Glucose-Capillary: 122 mg/dL — ABNORMAL HIGH (ref 70–99)
Glucose-Capillary: 160 mg/dL — ABNORMAL HIGH (ref 70–99)
Glucose-Capillary: 198 mg/dL — ABNORMAL HIGH (ref 70–99)
Glucose-Capillary: 154 mg/dL — ABNORMAL HIGH (ref 70–99)

## 2021-03-21 LAB — COMPREHENSIVE METABOLIC PANEL
ALT: 35 U/L (ref 0–44)
AST: 24 U/L (ref 15–41)
Albumin: 2.2 g/dL — ABNORMAL LOW (ref 3.5–5.0)
Alkaline Phosphatase: 85 U/L (ref 38–126)
Anion gap: 9 (ref 5–15)
BUN: 17 mg/dL (ref 6–20)
CO2: 25 mmol/L (ref 22–32)
Calcium: 8.1 mg/dL — ABNORMAL LOW (ref 8.9–10.3)
Chloride: 96 mmol/L — ABNORMAL LOW (ref 98–111)
Creatinine, Ser: 1.02 mg/dL (ref 0.61–1.24)
GFR, Estimated: >60 mL/min (ref >60)
Glucose, Bld: 119 mg/dL — ABNORMAL HIGH (ref 70–99)
Potassium: 3.2 mmol/L — ABNORMAL LOW (ref 3.5–5.1)
Sodium: 130 mmol/L — ABNORMAL LOW (ref 135–145)
Total Bilirubin: 1.8 mg/dL — ABNORMAL HIGH (ref 0.3–1.2)
Total Protein: 6 g/dL — ABNORMAL LOW (ref 6.5–8.1)

## 2021-03-21 LAB — MAGNESIUM: Magnesium: 1.5 mg/dL — ABNORMAL LOW (ref 1.7–2.4)

## 2021-03-21 LAB — PHOSPHORUS: Phosphorus: 2.8 mg/dL (ref 2.5–4.6)

## 2021-03-21 MED ORDER — EMPAGLIFLOZIN 10 MG PO TABS: 10.0000 mg | ORAL_TABLET | Freq: Every day | ORAL | Status: DC

## 2021-03-21 MED ORDER — MAGNESIUM SULFATE 2 GM/50ML IV SOLN
2.0000 g | Freq: Once | INTRAVENOUS | Status: AC
  Administered 2021-03-21: 2 g via INTRAVENOUS
  Filled 2021-03-21: qty 50

## 2021-03-21 MED ORDER — POTASSIUM CHLORIDE CRYS ER 20 MEQ PO TBCR: 40.0000 meq | EXTENDED_RELEASE_TABLET | Freq: Once | ORAL | Status: DC

## 2021-03-21 MED ORDER — EMPAGLIFLOZIN 10 MG PO TABS
10.0000 mg | ORAL_TABLET | Freq: Every day | ORAL | Status: DC
  Administered 2021-03-21 – 2021-03-22 (×2): 10 mg via ORAL
  Filled 2021-03-21 (×2): qty 1

## 2021-03-21 MED ORDER — POTASSIUM CHLORIDE CRYS ER 20 MEQ PO TBCR
40.0000 meq | EXTENDED_RELEASE_TABLET | ORAL | Status: AC
  Administered 2021-03-21 (×2): 40 meq via ORAL
  Filled 2021-03-21 (×2): qty 2

## 2021-03-21 NOTE — Progress Notes (Addendum)
Foley Catheter leaking from the meatus. Reddish in color.  Flushed drain tubing with 10 CC NS without resistance. Urine amber with sediments noted. No kinks noted from tubing. Will continue to monitor.

## 2021-03-21 NOTE — Evaluation (Signed)
Occupational Therapy Evaluation Patient Details Name: Fernando Maddox MRN: [id number] DOB: 04/29/1961 Today's Date: 03/21/2021    History of Present Illness 60 year old male admitted 5/20 from SNF fdue to SOB and alterned mental status and found to have severe septic shock with unknown source of infection. Pt has been at SNF since 5/13 following d/c after BKA. 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 Pt a poor historian and states that he was not at a SNF for rehab PTA, but asked again late multiple times and said that he was but that he couldn't remember if he was working with therapy or his level of assist required with ADLs and mobility. Pt current requires min A +2 for sitting EOB, min A with UB ADLS, max - total A with LB ADLs, total A with toileting. Pt had difficulty problem solving how to retrieve utensils from packet on lunch tray. Pt requiring increased cueing to remain aroused and attend to tasks, tactile and verbal cueing needed for motor planning, pt requiring +3 due to poor motor planning and command following for SQPT to recliner. Pt able to assist but due to confusion and lethargy requiring increased assist. performed +3 mini squat to change linen wtih pt able to press up wtih RLE. Pt would benefit from acute OT services to address impairments to maximize level of function and safety    Follow Up Recommendations  SNF;Supervision/Assistance - 24 hour    Equipment Recommendations  3 in 1 bedside commode;Wheelchair (measurements OT);Wheelchair cushion (measurements OT);Other (comment) (TBD at SNF)    Recommendations for Other Services       Precautions / Restrictions Precautions Precautions: Fall Precaution Comments: decreased safety,  lethargic/confused, BKA with poor positioning and contracted Required Braces or Orthoses: Other Brace Other Brace: limb protector; pt has a limb protector, limb warm, swollen and bleeding on medial side (RN notified )limb protector not placed following session Restrictions Weight Bearing Restrictions: Yes LLE Weight Bearing: Non weight bearing Other Position/Activity Restrictions: L BKA      Mobility Bed Mobility Overal bed mobility: Needs Assistance Bed Mobility: Supine to Sit     Supine to sit: Min assist;+2 for safety/equipment;HOB elevated     General bed mobility comments: pt able to lean onto elbow R and L with min A from therapist for linen management    Transfers Overall transfer level: Needs assistance Equipment used: Rolling walker (2 wheeled) Transfers: Lateral/Scoot Transfers          Lateral/Scoot Transfers: +2 safety/equipment;Max assist General transfer comment: pt requiring +3 due to poor motor planning and command following. pt able to assist but due to confusion and lethargy requiring increased assist. performed +3 mini squat to change linen wtih pt able to press up wtih RLE    Balance Overall balance assessment: Needs assistance Sitting-balance support: Bilateral upper extremity supported Sitting balance-Leahy Scale: Good Sitting balance - Comments: able to sit independently requires supervision   Standing balance support: Bilateral upper extremity supported                               ADL either performed or assessed with clinical judgement   ADL Overall ADL's : Needs assistance/impaired Eating/Feeding: Set up;Supervision/ safety;Sitting Eating/Feeding Details (indicate cue type  and reason): difficulty problem solving how to get untensils from packet Grooming: Min guard;Wash/dry hands;Wash/dry face;Brushing hair   Upper Body Bathing: Minimal assistance;Sitting   Lower Body Bathing: Maximal assistance;Sitting/lateral leans   Upper  Body Dressing : Minimal assistance;Sitting Upper Body Dressing Details (indicate cue type and reason): Min A to don clean gown, cues for sequencing Lower Body Dressing: Total assistance;Sitting/lateral leans   Toilet Transfer: Maximal assistance;+2 for physical assistance;+2 for safety/equipment;Cueing for safety;Cueing for sequencing;Squat-pivot Toilet Transfer Details (indicate cue type and reason): simulated to recliner Toileting- Clothing Manipulation and Hygiene: Total assistance;Bed level       Functional mobility during ADLs: Total assistance;+2 for safety/equipment;+2 for physical assistance       Vision Baseline Vision/History: Wears glasses Patient Visual Report: No change from baseline       Perception     Praxis      Pertinent Vitals/Pain Pain Assessment: Faces Pain Score: 6  Faces Pain Scale: Hurts even more Pain Location: L BKA site Pain Descriptors / Indicators: Aching;Discomfort;Sore Pain Intervention(s): Monitored during session;Repositioned     Hand Dominance Right   Extremity/Trunk Assessment Upper Extremity Assessment Upper Extremity Assessment: Generalized weakness   Lower Extremity Assessment Lower Extremity Assessment: Defer to PT evaluation   Cervical / Trunk Assessment Cervical / Trunk Assessment: Normal   Communication Communication Communication: Expressive difficulties;Other (comment) (garbled speech at times)   Cognition Arousal/Alertness: Lethargic Behavior During Therapy: Flat affect Overall Cognitive Status: Impaired/Different from baseline Area of Impairment: Following commands;Safety/judgement;Awareness;Problem solving;Attention                 Orientation Level: Disoriented to;Situation Current Attention Level: Selective   Following Commands: Follows one step commands with increased time Safety/Judgement: Decreased awareness of deficits;Decreased awareness of safety Awareness: Intellectual Problem Solving: Slow  processing;Decreased initiation;Difficulty sequencing;Requires verbal cues;Requires tactile cues General Comments: pt requiring increased cueing to remain aroused and attend to tasks, Manual and verbal cueing needed for motor planning, difficulty probme sloving how to retrieve lunchtray sliverware from packet   General Comments  PT noted L residual limb warm and swollen as well as bleeding on medial aspect of incision, RN notified.    Exercises Other Exercises Other Exercises: multimodal cues for initiation of tasks   Shoulder Instructions      Home Living Family/patient expects to be discharged to:: Skilled nursing facility Living Arrangements: Alone Available Help at Discharge: Family;Available PRN/intermittently Type of Home: House Home Access: Stairs to enter [employer] of Steps: 5 Entrance Stairs-Rails: Right;Left Home Layout: One level     Bathroom Shower/Tub: Tub/shower unit;Walk-in shower   Bathroom Toilet: Standard Bathroom Accessibility: No   Home Equipment: None          Prior Functioning/Environment Level of Independence: Independent        Comments: reports independent with ADLs/selfcare and mobility prior to L BKA, however was unable answer/recall level of assist since d/c to the SNF (pt initally couldn't recall even being at a SNF)        OT Problem List: Decreased strength;Decreased activity tolerance;Impaired balance (sitting and/or standing);Decreased cognition;Decreased safety awareness;Decreased knowledge of use of DME or AE;Decreased knowledge of precautions      OT Treatment/Interventions: Self-care/ADL training;Therapeutic exercise;Energy conservation;DME and/or AE instruction;Therapeutic activities;Patient/family education;Balance training;Cognitive remediation/compensation    OT Goals(Current goals can be found in the care plan section) Acute Rehab OT Goals Patient Stated Goal: I want to eat my lunch OT Goal Formulation: With  patient Time For Goal Achievement: 04/04/21 Potential to Achieve Goals: Fair  ADL Goals Pt Will Perform Grooming: with supervision;with set-up;sitting Pt Will Perform Upper Body Bathing: with min guard assist;with supervision;sitting Pt Will Perform Lower Body Bathing: with mod assist;sitting/lateral leans Pt Will Perform Upper Body Dressing: with min guard assist;with supervision;sitting Pt Will Transfer to Toilet: with max assist;with mod assist;stand pivot transfer;squat pivot transfer;bedside commode  OT Frequency: Min 2X/week   Barriers to D/C:            Co-evaluation PT/OT/SLP Co-Evaluation/Treatment: Yes Reason for Co-Treatment: Complexity of the patient's impairments (multi-system involvement);For patient/therapist safety;To address functional/ADL transfers PT goals addressed during session: Mobility/safety with mobility;Balance;Strengthening/ROM OT goals addressed during session: ADL's and self-care;Proper use of Adaptive equipment and DME      AM-PAC OT "6 Clicks" Daily Activity     Outcome Measure Help from another person eating meals?: A Little Help from another person taking care of personal grooming?: A Little Help from another person toileting, which includes using toliet, bedpan, or urinal?: Total Help from another person bathing (including washing, rinsing, drying)?: A Lot Help from another person to put on and taking off regular upper body clothing?: A Little Help from another person to put on and taking off regular lower body clothing?: Total 6 Click Score: 13   End of Session Equipment Utilized During Treatment: Gait belt;Other (comment) (drop arm recliner) Nurse Communication: Mobility status;Need for lift equipment  Activity Tolerance: Other (comment);Patient limited by lethargy (cognition) Patient left: in chair;with chair alarm set;with call bell/phone within reach  OT Visit Diagnosis: Unsteadiness on feet (R26.81);Other abnormalities of gait and  mobility (R26.89);Muscle weakness (generalized) (M62.81);Other symptoms and signs involving cognitive function                Time: 2706-2376 OT Time Calculation (min): 45 min Charges:  OT General Charges $OT Visit: 1 Visit OT Evaluation $OT Eval Moderate Complexity: 1 Mod    Galen Manila 03/21/2021, 3:00 PM

## 2021-03-21 NOTE — TOC Progression Note (Signed)
Transition of Care [facility]) - Progression Note    Patient Details  Name: Fernando Maddox MRN: [id number] Date of Birth: Oct 09, 1961  [facility]) CM/SW Contact  Okey Dupre Lazaro Arms, LCSW Phone Number: 03/21/2021, 4:54 PM  Clinical Narrative:  Facility search continues for patient.Patient currently has no bed offers, and was faxed out in [facility] to expand search. Went to patient's room (11:41 am) and patient asleep. Call made to [facility] [phone]) and talked with [facility] in admissions regarding patient and they have no rehab beds right now and no upcoming discharges. Christi suggested CSW call Tessa at [facility], however they declined patient in the [place].  Call made to [name], patient mother [phone]) to provide an update regarding no SNF beds. She asked if her son could return to [name]. Call made to [facility] and left a message for admissions director Antoinette to call CSW. Went to patient's room and he was napping. He responded when CSW called his name (eyes remained closed) and was informed that CSW will return to see him tomorrow.    Expected Discharge Plan: Skilled Nursing Facility Barriers to Discharge: SNF Pending bed offer  Expected Discharge Plan and Services Expected Discharge Plan: Skilled Nursing Facility In-house Referral: Clinical Social Work   Post Acute Care Choice: Skilled Nursing Facility Living arrangements for the past 2 months: Skilled Nursing Facility (Patient came to hospital from [name]. Prior to that he lived at home) Expected Discharge Date: 03/20/21                                  Social Determinants of Health (SDOH) Interventions  None needed or requested at this time  Readmission Risk Interventions No flowsheet data found.

## 2021-03-21 NOTE — Progress Notes (Signed)
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  Patient improved with IV pressor (dobutamine), volume resuscitation and broad-spectrum antibiotics.  Currently, patient is stable for discharge to SNF but waiting on bed.  Subjective: Seen and examined earlier this morning and this afternoon.  He was a sleepy this morning but awakes to voice.  He is awake and oriented x4 but not quite alert this afternoon.  He was sitting on bedside chair and eating his lunch this afternoon.  Reports shortness of breath, fatigue and pain in his left stump.   Objective: Vitals:   03/20/21 1003 03/20/21 1639 03/21/21 0446 03/21/21 1109  BP: 106/64 (!) 130/106 125/88 (!) 126/98  Pulse: (!) 55 (!) 46 64 86  Resp: 17 17 18 16   Temp: 98.3 F (36.8 C) 98.3 F (36.8 C) 98 F (36.7 C) 98.2 F (36.8 C)  TempSrc:   Oral   SpO2: 95% 100% 99% 98%  Weight:      Height:        Intake/Output Summary (Last 24 hours) at 03/21/2021 1327 Last data filed at 03/21/2021 0551 Gross per 24 hour  Intake 430 ml  Output 1450 ml  Net -1020 ml   Filed Weights   03/16/21 0500 03/18/21 2042 03/19/21 2026  Weight: 107.2 kg 107.2 kg 120.3 kg    Examination:  GENERAL: No apparent distress.  Nontoxic. HEENT: MMM.  Vision and hearing grossly intact.  NECK: Supple.  No apparent JVD.  RESP: On RA.  No IWOB.  Fair aeration bilaterally. CVS:  RRR. Heart sounds normal.  ABD/GI/GU: BS+. Abd soft, NTND.  Indwelling Foley catheter. MSK/EXT: Left BKA wound appears clean.   Staples in place.  2+ pitting RLE edema SKIN: no apparent skin lesion or wound NEURO: Awake and oriented x4 but not quite alert.  No apparent focal neuro deficit. PSYCH: Calm. Normal affect.   Procedures:  None  Microbiology summarized: COVID-19 and influenza PCR nonreactive. MRSA PCR screen negative. Urine culture NGTD Blood culture NGTD  Assessment & Plan: Cardiogenic versus septic shock-no clear source of infection.  Resolved with IV dobutamine and volume resuscitation.  Completed 5 days of broad-spectrum antibiotics.  -Continue on p.o. midodrine for BP support  High anion gap metabolic acidosis with the lactic acidosis: Likely from hypoperfusion in the setting of the above, and metformin.  Resolved -Discontinue metformin on discharge  Acute metabolic encephalopathy: Likely due to #1.  He is oriented x4 but not quite alert. -Avoid or minimize sedating medications -Reorientation and delirium precautions  Chronic biventricular heart failure/chronic combined CHF: TTE on 02/15/2021 with LVEF of 20 to 25%, GH, severely reduced RVSF, moderate RAE, LAE.  On p.o. Lasix at home.  Had 1.5 L UOP/24 hours.  Still with net +2 L and 2+ pitting edema.  Renal function is stable. -Continue p.o. Lasix 20 mg twice daily -GDMT-only Jardiance for now given soft blood pressure -Sodium and fluid restriction -Overall, poor prognosis  Chronic atrial fibrillation: Overall, rate controlled without medications. -May start low-dose metoprolol if BP stable -Continue Xarelto -Optimize electrolytes  Acute kidney  injury/azotemia: Resolved. Recent Labs    03/09/21 0226 03/09/21 2336 03/15/21 0557 03/15/21 1106 03/15/21 1307 03/15/21 1810 03/15/21 2313 03/16/21 0600 03/17/21 0448 03/18/21 0615 03/21/21 0446  BUN 29* 31* 28*  --  31* 34* 36* 36* 31* 26* 17  CREATININE 1.08 1.04 1.61* 1.50* 1.73* 1.66* 1.56* 1.54* 1.34* 1.11 1.02  -Monitor intermittently.  Mild  transaminitis/hyperbilirubinemia: Likely due to congestive hepatopathy.  Improving Recent Labs  Lab 03/15/21 1810 03/15/21 2313 03/16/21 0600 03/17/21 0448 03/21/21 0446  AST 164* 152* 115* 75* 24  ALT 85* 88* 83* 77* 35  ALKPHOS 78 75 66 80 85  BILITOT 2.9* 2.5* 2.2* 2.1* 1.8*  PROT 5.7* 5.6* 5.4* 5.8* 6.0*  ALBUMIN 2.2* 2.2* 2.1* 2.1* 2.2*  -Continue monitoring  Uncontrolled IDDM-2 with neuropathy-A1c 7.7% on 4/25. Recent Labs  Lab 03/20/21 1126 03/20/21 1639 03/20/21 2022 03/21/21 0622 03/21/21 1144  GLUCAP 117* 113* 169* 122* 160*  -Continue SSI  -Added Jardiance -Patient has history of statin intolerance.  Anemia/normocytic anemia: Stable. Recent Labs    03/07/21 0047 03/08/21 0538 03/09/21 0226 03/09/21 2336 03/15/21 0557 03/15/21 0828 03/15/21 1106 03/15/21 1349 03/17/21 0448 03/21/21 0446  HGB 10.8* 9.8* 9.5* 9.9* 11.2* 6.8* 9.8* 10.9* 8.6* 9.5*  -Monitor intermittently  Left foot osteomyelitis s/p left BKA: BKA wound appears stable. Staples in place Debility/physical deconditioning -Continue gabapentin and as needed oxycodone for pain -Outpatient follow-up with Dr. Lajoyce Cornersuda in about 2 weeks per Dr. Ophelia CharterYates -Continue PT/OT  Anxiety and depression: Stable.  It seems he was recently started on Cymbalta but did not start  Hypokalemia: Likely due to diuretics. -Replenish and recheck  Hyponatremia: Likely hypervolemic in the setting of CHF -Manage CHF as above  Goals of care: Patient with end-stage heart disease and poor long-term prognosis.  Appropriately DNR/DNI. -Needs palliative follow-up at SNF  Obesity Body mass index is 33.15 kg/m.       DVT prophylaxis:  rivaroxaban (XARELTO) tablet 20 mg Start: 03/17/21 1700 Place and maintain sequential compression device Start: 03/15/21 1008 rivaroxaban (XARELTO) tablet 20 mg  Code Status: DNR/DNI Family Communication: Patient and/or RN.  Attempted to call patient's father but no answer. Level of  care: Telemetry Cardiac Status is: Inpatient  Remains inpatient appropriate because:Unsafe d/c plan   Dispo: The patient is from: SNF              Anticipated d/c is to: SNF              Patient currently is medically stable to d/c.   Difficult to place patient No       Consultants:  PCCM Orthopedic surgery   Sch Meds:  Scheduled Meds: . Chlorhexidine Gluconate Cloth  6 each Topical Daily  . furosemide  20 mg Oral BID  . insulin aspart  0-5 Units Subcutaneous QHS  . insulin aspart  0-6 Units Subcutaneous TID WC  . midodrine  5 mg Oral TID WC  . pantoprazole  40 mg Oral Daily  . rivaroxaban  20 mg Oral Q supper  . thiamine  100 mg Oral Daily  . Thrombi-Pad  1 each Topical Once   Continuous Infusions: . sodium chloride 10 mL/hr at 03/16/21 1800  . magnesium sulfate bolus IVPB 2 g (03/21/21 1318)   PRN Meds:.food thickener, ondansetron (ZOFRAN) IV, oxyCODONE  Antimicrobials: Anti-infectives (From admission, onward)   Start     Dose/Rate Route Frequency Ordered Stop   03/18/21 0900  cefTRIAXone (ROCEPHIN) 2 g in sodium chloride 0.9 % 100  mL IVPB        2 g 200 mL/hr over 30 Minutes Intravenous Every 24 hours 03/18/21 0808 03/19/21 0832   03/15/21 2100  vancomycin (VANCOREADY) IVPB 1000 mg/200 mL  Status:  Discontinued        1,000 mg 200 mL/hr over 60 Minutes Intravenous Every 12 hours 03/15/21 0748 03/17/21 1138   03/15/21 1400  piperacillin-tazobactam (ZOSYN) IVPB 3.375 g  Status:  Discontinued        3.375 g 12.5 mL/hr over 240 Minutes Intravenous Every 8 hours 03/15/21 1009 03/18/21 0808   03/15/21 1015  piperacillin-tazobactam (ZOSYN) IVPB 3.375 g  Status:  Discontinued        3.375 g 100 mL/hr over 30 Minutes Intravenous  Once 03/15/21 1004 03/15/21 1008   03/15/21 0800  vancomycin (VANCOCIN) 2,500 mg in sodium chloride 0.9 % 500 mL IVPB        2,500 mg 250 mL/hr over 120 Minutes Intravenous  Once 03/15/21 0748 03/15/21 1144   03/15/21 0745   piperacillin-tazobactam (ZOSYN) IVPB 2.25 g  Status:  Discontinued        2.25 g 100 mL/hr over 30 Minutes Intravenous  Once 03/15/21 0736 03/15/21 0741   03/15/21 0745  piperacillin-tazobactam (ZOSYN) IVPB 3.375 g        3.375 g 100 mL/hr over 30 Minutes Intravenous  Once 03/15/21 0741 03/15/21 0820       I have personally reviewed the following labs and images: CBC: Recent Labs  Lab 03/15/21 0557 03/15/21 0828 03/15/21 1106 03/15/21 1349 03/17/21 0448 03/21/21 0446  WBC 11.9*  --  9.8  --  9.4 8.2  NEUTROABS 9.5*  --   --   --   --   --   HGB 11.2* 6.8* 9.8* 10.9* 8.6* 9.5*  HCT 38.0* 20.0* 35.2* 32.0* 28.4* 29.8*  MCV 93.8  --  100.0  --  90.4 85.4  PLT 409*  --  269  --  271 288   BMP &GFR Recent Labs  Lab 03/15/21 0730 03/15/21 0828 03/15/21 2313 03/16/21 0600 03/17/21 0448 03/18/21 0615 03/21/21 0446  NA  --    < > 132* 134* 129* 130* 130*  K  --    < > 4.8 4.3 3.8 3.5 3.2*  CL  --    < > 100 100 97* 96* 96*  CO2  --    < > 22 26 26 24 25   GLUCOSE  --    < > 190* 154* 137* 113* 119*  BUN  --    < > 36* 36* 31* 26* 17  CREATININE  --    < > 1.56* 1.54* 1.34* 1.11 1.02  CALCIUM  --    < > 8.3* 8.3* 7.9* 8.0* 8.1*  MG 2.1  --   --  1.9 1.9 1.8 1.5*  PHOS  --   --   --  4.7* 3.1 3.1 2.8   < > = values in this interval not displayed.   Estimated Creatinine Clearance: 109 mL/min (by C-G formula based on SCr of 1.02 mg/dL). Liver & Pancreas: Recent Labs  Lab 03/15/21 1810 03/15/21 2313 03/16/21 0600 03/17/21 0448 03/21/21 0446  AST 164* 152* 115* 75* 24  ALT 85* 88* 83* 77* 35  ALKPHOS 78 75 66 80 85  BILITOT 2.9* 2.5* 2.2* 2.1* 1.8*  PROT 5.7* 5.6* 5.4* 5.8* 6.0*  ALBUMIN 2.2* 2.2* 2.1* 2.1* 2.2*   Recent Labs  Lab 03/15/21 1106  LIPASE 34  Recent Labs  Lab 03/15/21 0819  AMMONIA <9*   Diabetic: No results for input(s): HGBA1C in the last 72 hours. Recent Labs  Lab 03/20/21 1126 03/20/21 1639 03/20/21 2022 03/21/21 0622 03/21/21 1144   GLUCAP 117* 113* 169* 122* 160*   Cardiac Enzymes: Recent Labs  Lab 03/15/21 0730  CKTOTAL 113   No results for input(s): PROBNP in the last 8760 hours. Coagulation Profile: Recent Labs  Lab 03/15/21 1106 03/16/21 0600  INR 9.3* 3.4*   Thyroid Function Tests: No results for input(s): TSH, T4TOTAL, FREET4, T3FREE, THYROIDAB in the last 72 hours. Lipid Profile: No results for input(s): CHOL, HDL, LDLCALC, TRIG, CHOLHDL, LDLDIRECT in the last 72 hours. Anemia Panel: No results for input(s): VITAMINB12, FOLATE, FERRITIN, TIBC, IRON, RETICCTPCT in the last 72 hours. Urine analysis:    Component Value Date/Time   COLORURINE AMBER (A) 03/15/2021 0538   APPEARANCEUR TURBID (A) 03/15/2021 0538   LABSPEC >1.030 (H) 03/15/2021 0538   PHURINE 5.0 03/15/2021 0538   GLUCOSEU NEGATIVE 03/15/2021 0538   HGBUR LARGE (A) 03/15/2021 0538   BILIRUBINUR MODERATE (A) 03/15/2021 0538   KETONESUR NEGATIVE 03/15/2021 0538   PROTEINUR >300 (A) 03/15/2021 0538   NITRITE NEGATIVE 03/15/2021 0538   LEUKOCYTESUR NEGATIVE 03/15/2021 0538   Sepsis Labs: Invalid input(s): PROCALCITONIN, LACTICIDVEN  Microbiology: Recent Results (from the past 240 hour(s))  Culture, blood (routine x 2)     Status: None   Collection Time: 03/15/21  5:38 AM   Specimen: BLOOD  Result Value Ref Range Status   Specimen Description BLOOD RIGHT ANTECUBITAL  Final   Special Requests   Final    BOTTLES DRAWN AEROBIC AND ANAEROBIC Blood Culture adequate volume   Culture   Final    NO GROWTH 5 DAYS Performed at [facility], [address], [place], [place] [address]    Report Status 03/20/2021 FINAL  Final  Urine culture     Status: None   Collection Time: 03/15/21  5:38 AM   Specimen: Urine, Random  Result Value Ref Range Status   Specimen Description URINE, RANDOM  Final   Special Requests NONE  Final   Culture   Final    NO GROWTH Performed at [facility], [address] [place], [place]  [address]    Report Status 03/16/2021 FINAL  Final  Culture, blood (routine x 2)     Status: None   Collection Time: 03/15/21  5:43 AM   Specimen: BLOOD  Result Value Ref Range Status   Specimen Description BLOOD BLOOD RIGHT FOREARM  Final   Special Requests   Final    BOTTLES DRAWN AEROBIC AND ANAEROBIC Blood Culture adequate volume   Culture   Final    NO GROWTH 5 DAYS Performed at [facility], [address] [place], [place] [address]    Report Status 03/20/2021 FINAL  Final  Resp Panel by RT-PCR (Flu A&B, Covid) Nasopharyngeal Swab     Status: None   Collection Time: 03/15/21  7:42 AM   Specimen: Nasopharyngeal Swab; Nasopharyngeal(NP) swabs in vial transport medium  Result Value Ref Range Status   SARS Coronavirus 2 by RT PCR NEGATIVE NEGATIVE Final    Comment: (NOTE) SARS-CoV-2 target nucleic acids are NOT DETECTED.  The SARS-CoV-2 RNA is generally detectable in upper respiratory specimens during the acute phase of infection. The lowest concentration of SARS-CoV-2 viral copies this assay can detect is 138 copies/mL. A negative result does not preclude SARS-Cov-2 infection  and should not be used as the sole basis for treatment or other patient management decisions. A negative result may occur with  improper specimen collection/handling, submission of specimen other than nasopharyngeal swab, presence of viral mutation(s) within the areas targeted by this assay, and inadequate number of viral copies(<138 copies/mL). A negative result must be combined with clinical observations, patient history, and epidemiological information. The expected result is Negative.  Fact Sheet for Patients:  BloggerCourse.com  Fact Sheet for Healthcare Providers:  SeriousBroker.it  This test is no t yet approved or cleared by the [place] FDA and  has been authorized for detection and/or diagnosis of SARS-CoV-2 by FDA under an  Emergency Use Authorization (EUA). This EUA will remain  in effect (meaning this test can be used) for the duration of the COVID-19 declaration under Section 564(b)(1) of the Act, 21 U.S.C.section 360bbb-3(b)(1), unless the authorization is terminated  or revoked sooner.       Influenza A by PCR NEGATIVE NEGATIVE Final   Influenza B by PCR NEGATIVE NEGATIVE Final    Comment: (NOTE) The Xpert Xpress SARS-CoV-2/FLU/RSV plus assay is intended as an aid in the diagnosis of influenza from Nasopharyngeal swab specimens and should not be used as a sole basis for treatment. Nasal washings and aspirates are unacceptable for Xpert Xpress SARS-CoV-2/FLU/RSV testing.  Fact Sheet for Patients: BloggerCourse.com  Fact Sheet for Healthcare Providers: SeriousBroker.it  This test is not yet approved or cleared by the [place] FDA and has been authorized for detection and/or diagnosis of SARS-CoV-2 by FDA under an Emergency Use Authorization (EUA). This EUA will remain in effect (meaning this test can be used) for the duration of the COVID-19 declaration under Section 564(b)(1) of the Act, 21 U.S.C. section 360bbb-3(b)(1), unless the authorization is terminated or revoked.  Performed at [facility], [address], [place], [place] [address]   MRSA PCR Screening     Status: None   Collection Time: 03/15/21 12:55 PM   Specimen: Nasal Mucosa; Nasopharyngeal  Result Value Ref Range Status   MRSA by PCR NEGATIVE NEGATIVE Final    Comment:        The GeneXpert MRSA Assay (FDA approved for NASAL specimens only), is one component of a comprehensive MRSA colonization surveillance program. It is not intended to diagnose MRSA infection nor to guide or monitor treatment for MRSA infections. Performed at [facility] Lab, [address], [place], [place] [address]     Radiology Studies: No results found.    Kriston Mckinnie T.  Grainger Mccarley [facility]  If 7PM-7AM, please contact night-coverage [email or web address] 03/21/2021, 1:27 PM

## 2021-03-21 NOTE — Progress Notes (Signed)
Physical Therapy Treatment Patient Details Name: Fernando Maddox MRN: [id number] DOB: 31-Mar-1961 Today's Date: 03/21/2021    History of Present Illness 60 year old male admitted 5/20 from SNF fdue to SOB and alterned mental status and found to have severe septic shock with unknown source of infection. Pt has been at SNF since 5/13 following d/c after BKA. Pt with poorly controlled diabetes and osteomyelitis of left lower extremity status post BKA, biventricular systolic heart failure    PT Comments    Pt participated in session with increased cueing to remain aroused as well as participate in session. Increased time and cueing needed to motor plan gross motor skills. Pt with improved bed mobility and able to perform transfer during session with +3 assist (lateral scoot). Pt's residual limb noted to be swollen, warm, bleeding on medial aspect of incision and contracted, RN and MD made aware. Pt mildly receptive to postioning of LLE to improve knee extension. Pt will benefit from skilled PT to address deficits in ROM, strength, coordination, bed mobility, transfers, safety and endurance to maximize independence with functional mobility prior to discharge.     Follow Up Recommendations  SNF;Supervision/Assistance - 24 hour     Equipment Recommendations  Other (comment) (TBD)    Recommendations for Other Services       Precautions / Restrictions Precautions Precautions: Fall Precaution Comments: decreased safety, lethargic/confused, BKA with poor positioning and contracted Other Brace: limb protector; pt has a limb protector, limb warm, swollen and bleeding on medial side (RN notified )limb protector not placed following session Restrictions Weight Bearing Restrictions: Yes LLE Weight Bearing: Weight bearing as tolerated Other Position/Activity Restrictions: L BKA    Mobility  Bed Mobility Overal bed mobility: Needs Assistance Bed Mobility: Supine to Sit     Supine to sit: Min  assist;+2 for safety/equipment;HOB elevated     General bed mobility comments: pt able to lean onto elbow R and L with min A from therapist for linen management    Transfers Overall transfer level: Needs assistance   Transfers: Lateral/Scoot Transfers          Lateral/Scoot Transfers: +2 safety/equipment;Max assist (+3) General transfer comment: pt requiring +3 due to poor motor planning and command following. pt able to assist but due to confusion and lethargy requiring increased assist. performed +3 mini squat to change linen wtih pt able to press up wtih RLE  Ambulation/Gait                 Stairs             Wheelchair Mobility    Modified Rankin (Stroke Patients Only)       Balance Overall balance assessment: Needs assistance Sitting-balance support: Bilateral upper extremity supported Sitting balance-Leahy Scale: Good Sitting balance - Comments: able to sit independently requires supervision                                    Cognition Arousal/Alertness: Lethargic Behavior During Therapy: Flat affect Overall Cognitive Status: Impaired/Different from baseline Area of Impairment: Following commands;Safety/judgement;Awareness;Problem solving;Attention                 Orientation Level: Disoriented to;Situation Current Attention Level: Selective   Following Commands: Follows one step commands with increased time Safety/Judgement: Decreased awareness of deficits;Decreased awareness of safety Awareness: Intellectual Problem Solving: Slow processing;Decreased initiation;Difficulty sequencing;Requires verbal cues;Requires tactile cues General Comments: pt requiring increased cueing to remain  aroused and attend to tasks, Manual and verbal cueing needed for motor planning      Exercises      General Comments General comments (skin integrity, edema, etc.): PT noted L residual limb warm and swollen as well as bleeding on medial  aspect of incision, RN notified.      Pertinent Vitals/Pain Pain Assessment: 0-10 Pain Score: 6  Pain Location: L BKA site    Home Living                      Prior Function            PT Goals (current goals can now be found in the care plan section) Acute Rehab PT Goals Patient Stated Goal: I want to eat my lunch PT Goal Formulation: With patient Time For Goal Achievement: 04/01/21 Potential to Achieve Goals: Fair Additional Goals Additional Goal #1: will be able to self propel WC at least 57ft with no more than MinA Progress towards PT goals: Progressing toward goals    Frequency    Min 2X/week      PT Plan Current plan remains appropriate    Co-evaluation PT/OT/SLP Co-Evaluation/Treatment: Yes Reason for Co-Treatment: Complexity of the patient's impairments (multi-system involvement);Necessary to address cognition/behavior during functional activity;For patient/therapist safety;To address functional/ADL transfers PT goals addressed during session: Mobility/safety with mobility;Balance;Strengthening/ROM        AM-PAC PT "6 Clicks" Mobility   Outcome Measure  Help needed turning from your back to your side while in a flat bed without using bedrails?: A Little Help needed moving from lying on your back to sitting on the side of a flat bed without using bedrails?: A Little Help needed moving to and from a bed to a chair (including a wheelchair)?: A Lot Help needed standing up from a chair using your arms (e.g., wheelchair or bedside chair)?: Total Help needed to walk in hospital room?: Total Help needed climbing 3-5 steps with a railing? : Total 6 Click Score: 11    End of Session Equipment Utilized During Treatment: Gait belt Activity Tolerance: Patient tolerated treatment well;Patient limited by lethargy Patient left: in chair;with call bell/phone within reach;with chair alarm set Nurse Communication: Need for lift equipment;Mobility status PT  Visit Diagnosis: Other abnormalities of gait and mobility (R26.89);Pain;History of falling (Z91.81);Muscle weakness (generalized) (M62.81) Pain - Right/Left: Left Pain - part of body: Leg     Time: 7829-5621 PT Time Calculation (min) (ACUTE ONLY): 36 min  Charges:  $Therapeutic Activity: 8-22 mins                     Ginette Otto, DPT Acute Rehabilitation Services [phone]   Lucretia Field 03/21/2021, 1:44 PM

## 2021-03-22 ENCOUNTER — Ambulatory Visit: Payer: Medicare Other | Admitting: Physician Assistant

## 2021-03-22 DIAGNOSIS — Z20822 Contact with and (suspected) exposure to covid-19: Secondary | ICD-10-CM | POA: Diagnosis present

## 2021-03-22 DIAGNOSIS — K761 Chronic passive congestion of liver: Secondary | ICD-10-CM | POA: Diagnosis present

## 2021-03-22 DIAGNOSIS — R0602 Shortness of breath: Secondary | ICD-10-CM | POA: Diagnosis not present

## 2021-03-22 DIAGNOSIS — R7401 Elevation of levels of liver transaminase levels: Secondary | ICD-10-CM

## 2021-03-22 DIAGNOSIS — R0902 Hypoxemia: Secondary | ICD-10-CM | POA: Diagnosis not present

## 2021-03-22 DIAGNOSIS — Z20828 Contact with and (suspected) exposure to other viral communicable diseases: Secondary | ICD-10-CM | POA: Diagnosis not present

## 2021-03-22 DIAGNOSIS — E11649 Type 2 diabetes mellitus with hypoglycemia without coma: Secondary | ICD-10-CM | POA: Diagnosis not present

## 2021-03-22 DIAGNOSIS — I2510 Atherosclerotic heart disease of native coronary artery without angina pectoris: Secondary | ICD-10-CM | POA: Diagnosis not present

## 2021-03-22 DIAGNOSIS — A419 Sepsis, unspecified organism: Secondary | ICD-10-CM | POA: Diagnosis present

## 2021-03-22 DIAGNOSIS — I4811 Longstanding persistent atrial fibrillation: Secondary | ICD-10-CM | POA: Diagnosis not present

## 2021-03-22 DIAGNOSIS — E871 Hypo-osmolality and hyponatremia: Secondary | ICD-10-CM | POA: Diagnosis present

## 2021-03-22 DIAGNOSIS — J189 Pneumonia, unspecified organism: Secondary | ICD-10-CM | POA: Diagnosis not present

## 2021-03-22 DIAGNOSIS — Z8673 Personal history of transient ischemic attack (TIA), and cerebral infarction without residual deficits: Secondary | ICD-10-CM | POA: Diagnosis not present

## 2021-03-22 DIAGNOSIS — J90 Pleural effusion, not elsewhere classified: Secondary | ICD-10-CM | POA: Diagnosis not present

## 2021-03-22 DIAGNOSIS — I5043 Acute on chronic combined systolic (congestive) and diastolic (congestive) heart failure: Secondary | ICD-10-CM | POA: Diagnosis present

## 2021-03-22 DIAGNOSIS — K219 Gastro-esophageal reflux disease without esophagitis: Secondary | ICD-10-CM | POA: Diagnosis present

## 2021-03-22 DIAGNOSIS — F32A Depression, unspecified: Secondary | ICD-10-CM | POA: Diagnosis present

## 2021-03-22 DIAGNOSIS — R410 Disorientation, unspecified: Secondary | ICD-10-CM | POA: Diagnosis not present

## 2021-03-22 DIAGNOSIS — E872 Acidosis: Secondary | ICD-10-CM | POA: Diagnosis not present

## 2021-03-22 DIAGNOSIS — Z515 Encounter for palliative care: Secondary | ICD-10-CM | POA: Diagnosis not present

## 2021-03-22 DIAGNOSIS — N179 Acute kidney failure, unspecified: Secondary | ICD-10-CM | POA: Diagnosis not present

## 2021-03-22 DIAGNOSIS — E1151 Type 2 diabetes mellitus with diabetic peripheral angiopathy without gangrene: Secondary | ICD-10-CM | POA: Diagnosis present

## 2021-03-22 DIAGNOSIS — E785 Hyperlipidemia, unspecified: Secondary | ICD-10-CM | POA: Diagnosis present

## 2021-03-22 DIAGNOSIS — I5023 Acute on chronic systolic (congestive) heart failure: Secondary | ICD-10-CM | POA: Diagnosis not present

## 2021-03-22 DIAGNOSIS — E1152 Type 2 diabetes mellitus with diabetic peripheral angiopathy with gangrene: Secondary | ICD-10-CM | POA: Diagnosis present

## 2021-03-22 DIAGNOSIS — T8781 Dehiscence of amputation stump: Secondary | ICD-10-CM | POA: Diagnosis present

## 2021-03-22 DIAGNOSIS — R570 Cardiogenic shock: Secondary | ICD-10-CM | POA: Diagnosis not present

## 2021-03-22 DIAGNOSIS — E1165 Type 2 diabetes mellitus with hyperglycemia: Secondary | ICD-10-CM | POA: Diagnosis present

## 2021-03-22 DIAGNOSIS — E43 Unspecified severe protein-calorie malnutrition: Secondary | ICD-10-CM | POA: Diagnosis present

## 2021-03-22 DIAGNOSIS — M7989 Other specified soft tissue disorders: Secondary | ICD-10-CM | POA: Diagnosis not present

## 2021-03-22 DIAGNOSIS — R404 Transient alteration of awareness: Secondary | ICD-10-CM | POA: Diagnosis not present

## 2021-03-22 DIAGNOSIS — I4891 Unspecified atrial fibrillation: Secondary | ICD-10-CM | POA: Diagnosis present

## 2021-03-22 DIAGNOSIS — I480 Paroxysmal atrial fibrillation: Secondary | ICD-10-CM | POA: Diagnosis not present

## 2021-03-22 DIAGNOSIS — I2781 Cor pulmonale (chronic): Secondary | ICD-10-CM | POA: Diagnosis present

## 2021-03-22 DIAGNOSIS — I5040 Unspecified combined systolic (congestive) and diastolic (congestive) heart failure: Secondary | ICD-10-CM | POA: Diagnosis present

## 2021-03-22 DIAGNOSIS — R451 Restlessness and agitation: Secondary | ICD-10-CM | POA: Diagnosis not present

## 2021-03-22 DIAGNOSIS — I081 Rheumatic disorders of both mitral and tricuspid valves: Secondary | ICD-10-CM | POA: Diagnosis present

## 2021-03-22 DIAGNOSIS — E161 Other hypoglycemia: Secondary | ICD-10-CM | POA: Diagnosis not present

## 2021-03-22 DIAGNOSIS — I4821 Permanent atrial fibrillation: Secondary | ICD-10-CM | POA: Diagnosis not present

## 2021-03-22 DIAGNOSIS — M869 Osteomyelitis, unspecified: Secondary | ICD-10-CM | POA: Diagnosis present

## 2021-03-22 DIAGNOSIS — R531 Weakness: Secondary | ICD-10-CM | POA: Diagnosis not present

## 2021-03-22 DIAGNOSIS — I10 Essential (primary) hypertension: Secondary | ICD-10-CM | POA: Diagnosis present

## 2021-03-22 DIAGNOSIS — J9601 Acute respiratory failure with hypoxia: Secondary | ICD-10-CM | POA: Diagnosis not present

## 2021-03-22 DIAGNOSIS — E440 Moderate protein-calorie malnutrition: Secondary | ICD-10-CM | POA: Diagnosis not present

## 2021-03-22 DIAGNOSIS — I679 Cerebrovascular disease, unspecified: Secondary | ICD-10-CM | POA: Diagnosis present

## 2021-03-22 DIAGNOSIS — E11618 Type 2 diabetes mellitus with other diabetic arthropathy: Secondary | ICD-10-CM | POA: Diagnosis present

## 2021-03-22 DIAGNOSIS — J181 Lobar pneumonia, unspecified organism: Secondary | ICD-10-CM | POA: Diagnosis present

## 2021-03-22 DIAGNOSIS — I517 Cardiomegaly: Secondary | ICD-10-CM | POA: Diagnosis not present

## 2021-03-22 DIAGNOSIS — I110 Hypertensive heart disease with heart failure: Secondary | ICD-10-CM | POA: Diagnosis present

## 2021-03-22 DIAGNOSIS — F3341 Major depressive disorder, recurrent, in partial remission: Secondary | ICD-10-CM | POA: Diagnosis not present

## 2021-03-22 DIAGNOSIS — K7200 Acute and subacute hepatic failure without coma: Secondary | ICD-10-CM | POA: Diagnosis present

## 2021-03-22 DIAGNOSIS — Z7901 Long term (current) use of anticoagulants: Secondary | ICD-10-CM | POA: Diagnosis not present

## 2021-03-22 DIAGNOSIS — R600 Localized edema: Secondary | ICD-10-CM | POA: Diagnosis not present

## 2021-03-22 DIAGNOSIS — R5381 Other malaise: Secondary | ICD-10-CM | POA: Diagnosis not present

## 2021-03-22 DIAGNOSIS — R6521 Severe sepsis with septic shock: Secondary | ICD-10-CM | POA: Diagnosis present

## 2021-03-22 DIAGNOSIS — I70202 Unspecified atherosclerosis of native arteries of extremities, left leg: Secondary | ICD-10-CM | POA: Diagnosis present

## 2021-03-22 DIAGNOSIS — D649 Anemia, unspecified: Secondary | ICD-10-CM | POA: Diagnosis present

## 2021-03-22 DIAGNOSIS — I428 Other cardiomyopathies: Secondary | ICD-10-CM | POA: Diagnosis not present

## 2021-03-22 DIAGNOSIS — I482 Chronic atrial fibrillation, unspecified: Secondary | ICD-10-CM | POA: Diagnosis present

## 2021-03-22 DIAGNOSIS — Z89512 Acquired absence of left leg below knee: Secondary | ICD-10-CM | POA: Diagnosis not present

## 2021-03-22 DIAGNOSIS — G9341 Metabolic encephalopathy: Secondary | ICD-10-CM | POA: Diagnosis present

## 2021-03-22 DIAGNOSIS — I5082 Biventricular heart failure: Secondary | ICD-10-CM | POA: Diagnosis present

## 2021-03-22 DIAGNOSIS — E162 Hypoglycemia, unspecified: Secondary | ICD-10-CM | POA: Diagnosis not present

## 2021-03-22 DIAGNOSIS — R4182 Altered mental status, unspecified: Secondary | ICD-10-CM | POA: Diagnosis not present

## 2021-03-22 DIAGNOSIS — E7800 Pure hypercholesterolemia, unspecified: Secondary | ICD-10-CM | POA: Diagnosis present

## 2021-03-22 DIAGNOSIS — G4733 Obstructive sleep apnea (adult) (pediatric): Secondary | ICD-10-CM | POA: Diagnosis present

## 2021-03-22 DIAGNOSIS — S2243XA Multiple fractures of ribs, bilateral, initial encounter for closed fracture: Secondary | ICD-10-CM | POA: Diagnosis not present

## 2021-03-22 DIAGNOSIS — R579 Shock, unspecified: Secondary | ICD-10-CM | POA: Diagnosis not present

## 2021-03-22 DIAGNOSIS — Z66 Do not resuscitate: Secondary | ICD-10-CM | POA: Diagnosis present

## 2021-03-22 LAB — GLUCOSE, CAPILLARY
Glucose-Capillary: 122 mg/dL — ABNORMAL HIGH (ref 70–99)
Glucose-Capillary: 135 mg/dL — ABNORMAL HIGH (ref 70–99)
Glucose-Capillary: 199 mg/dL — ABNORMAL HIGH (ref 70–99)

## 2021-03-22 LAB — RENAL FUNCTION PANEL
Albumin: 2.2 g/dL — ABNORMAL LOW (ref 3.5–5.0)
Anion gap: 8 (ref 5–15)
BUN: 17 mg/dL (ref 6–20)
CO2: 26 mmol/L (ref 22–32)
Calcium: 8.1 mg/dL — ABNORMAL LOW (ref 8.9–10.3)
Chloride: 98 mmol/L (ref 98–111)
Creatinine, Ser: 0.99 mg/dL (ref 0.61–1.24)
GFR, Estimated: >60 mL/min (ref >60)
Glucose, Bld: 107 mg/dL — ABNORMAL HIGH (ref 70–99)
Phosphorus: 2.8 mg/dL (ref 2.5–4.6)
Potassium: 3.4 mmol/L — ABNORMAL LOW (ref 3.5–5.1)
Sodium: 132 mmol/L — ABNORMAL LOW (ref 135–145)

## 2021-03-22 LAB — RESP PANEL BY RT-PCR (FLU A&B, COVID) ARPGX2
Influenza A by PCR: NEGATIVE
Influenza B by PCR: NEGATIVE
SARS Coronavirus 2 by RT PCR: NEGATIVE

## 2021-03-22 LAB — HEMOGLOBIN AND HEMATOCRIT, BLOOD
HCT: 30 % — ABNORMAL LOW (ref 39.0–52.0)
Hemoglobin: 9.7 g/dL — ABNORMAL LOW (ref 13.0–17.0)

## 2021-03-22 LAB — MAGNESIUM: Magnesium: 1.6 mg/dL — ABNORMAL LOW (ref 1.7–2.4)

## 2021-03-22 MED ORDER — MAGNESIUM SULFATE 2 GM/50ML IV SOLN
2.0000 g | Freq: Once | INTRAVENOUS | Status: AC
  Administered 2021-03-22: 2 g via INTRAVENOUS
  Filled 2021-03-22: qty 50

## 2021-03-22 MED ORDER — DULOXETINE HCL 20 MG PO CPEP: 20.0000 mg | ORAL_CAPSULE | Freq: Every day | ORAL | 3 refills | Status: DC

## 2021-03-22 MED ORDER — CEPHALEXIN 500 MG PO CAPS: 500.0000 mg | ORAL_CAPSULE | Freq: Three times a day (TID) | ORAL | 0 refills | Status: DC

## 2021-03-22 MED ORDER — POTASSIUM CHLORIDE CRYS ER 20 MEQ PO TBCR
40.0000 meq | EXTENDED_RELEASE_TABLET | ORAL | Status: AC
  Administered 2021-03-22 (×2): 40 meq via ORAL
  Filled 2021-03-22 (×2): qty 2

## 2021-03-22 MED ORDER — SPIRONOLACTONE 25 MG PO TABS: 12.5000 mg | ORAL_TABLET | Freq: Every day | ORAL | 3 refills | Status: DC

## 2021-03-22 MED ORDER — POTASSIUM CHLORIDE CRYS ER 20 MEQ PO TBCR: 20.0000 meq | EXTENDED_RELEASE_TABLET | Freq: Two times a day (BID) | ORAL | Status: DC

## 2021-03-22 NOTE — Care Management Important Message (Signed)
Important Message  Patient Details  Name: Fernando Maddox MRN: [id number] Date of Birth: [birth year]   Medicare Important Message Given:  Yes - Important Message mailed due to current National Emergency   Verbal consent obtained due to current National Emergency  Relationship to patient: Self Contact Name: Rondy Call Date: 03/22/21  Time: 0845 Phone: [phone] Outcome: Spoke with contact Important Message mailed to: Patient address on file     Orson Aloe 03/22/2021, 8:46 AM

## 2021-03-22 NOTE — TOC Transition Note (Signed)
Transition of Care (TOC) - CM/SW Discharge Note* *Discharged back to [name] *Number for Report: [phone]   Patient Details  Name: Fernando Maddox MRN: [id number] Date of Birth: Feb 11, 1961  Transition of Care [facility]) CM/SW Contact:  Cristobal Goldmann, LCSW Phone Number: 03/22/2021, 3:15 PM   Clinical Narrative:  Patient medically stable for discharge and returning to [facility]. Discharge clinicals transmitted to facility, and Fernando Maddox' COVID test resulted negative. Patient's mother, Fernando Maddox was contacted [phone]) and informed about today's discharge. Fernando Maddox will be transported by non-emergency ambulance transport.    Final next level of care: Skilled Nursing Facility [facility]) Barriers to Discharge: Barriers Resolved   Patient Goals and CMS Choice Patient states their goals for this hospitalization and ongoing recovery are:: Patient agreeable to returning to [name] to continue rehab CMS Medicare.gov Compare Post Acute Care list provided to:: Patient Choice offered to / list presented to : Patient  Discharge Placement   Existing PASRR number confirmed : 03/20/21          Patient chooses bed at: Other - please specify in the comment section below: [facility]) Patient to be transferred to facility by: [facility] Name of family member notified: Mother - Fernando Deboy Patient and family notified of of transfer: 03/22/21  Discharge Plan and Services In-house Referral: Clinical Social Work   Post Acute Care Choice: Skilled Nursing Facility                              Social Determinants of Health (SDOH) Interventions  No SDOH interventions requested or needed at discharge.    Readmission Risk Interventions No flowsheet data found.

## 2021-03-22 NOTE — Progress Notes (Signed)
Orthopedic Tech Progress Note Patient Details:  Fernando Maddox Nov 08, 1960 [id number] Called into [facility] for [place] Patient ID: Fernando Maddox, male   DOB: [birth year], 60 y.o.   MRN: [id number]   [name] 03/22/2021, 8:44 AM

## 2021-03-22 NOTE — Progress Notes (Signed)
Called report to [name] spoke to [place].

## 2021-03-22 NOTE — Discharge Summary (Addendum)
Physician Discharge Summary  Fernando Maddox OHY:[id number] DOB: [birth year] DOA: 03/15/2021  PCP: Crist Fat, MD  Admit date: 03/15/2021 Discharge date: 03/22/2021  Admitted From: SNF Disposition: SNF  Recommendations for Outpatient Follow-up:  1. Follow ups as below. 2. Please obtain CBC/BMP/Mag at follow up 3. Recommend palliative medicine follow-up at SNF 4. Please follow up on the following pending results: None   Discharge Condition: Stable but guarded prognosis CODE STATUS: DNR/DNI   Follow-up Information    Persons, [place], [place] Follow up.   Specialty: Orthopedic Surgery Why: Follow-up 1 week after discharge Contact information: [address] [place] [address] [phone]        [name], MD. Schedule an appointment as soon as possible for a visit in 1 week(s).   Specialty: Orthopedic Surgery Contact information: [address] [place] [address] [phone]        RevankarAundra Dubin, MD. Schedule an appointment as soon as possible for a visit in 2 week(s).   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 Patient improved with IV pressor (dobutamine), volume resuscitation and broad-spectrum antibiotics.  He was also evaluated by orthopedic surgery, who gave recommendation on wound care and outpatient follow-up in 1 week after discharge.  Therapy recommended discharge back to SNF.  See individual problem list below for more on hospital course.  Discharge Diagnoses:  Cardiogenic versus septic shock-no clear source of infection.  Resolved  with IV dobutamine and volume resuscitation.  Completed 5 days of broad-spectrum antibiotics.  -Continue on p.o. midodrine 5 mg 3 times daily for blood pressure support  High anion gap metabolic acidosis with the lactic acidosis: Likely from hypoperfusion in the setting of the above, and lactic acidosis due to metformin.  Resolved -Discontinued metformin on discharge  Acute metabolic encephalopathy: Likely due to #1.  He is oriented x4 but not quite alert. -Avoid or minimize sedating medications -Reorientation and delirium precautions  Chronic biventricular heart failure/chronic combined CHF: TTE on 02/15/2021 with LVEF of 20 to 25%, GH, severely reduced RVSF, moderate RAE, LAE.  On p.o. Lasix at home.    UOP 1.5 L from overnight. Still with net +1.5 L and 2+ pitting edema.  Renal function is stable. -Continue p.o. Lasix 20 mg twice daily -GDMT-added low-dose Jardiance and Aldactone -Sodium and fluid restriction to 2 g and 1500 cc a day respectively -Overall, poor prognosis.  Recommend palliative medicine follow-up at SNF.  Chronic atrial fibrillation: Overall, rate controlled without medications. -Continue Xarelto -Optimize electrolytes  Acute kidney injury/azotemia: Resolved. Recent Labs    03/09/21 2336 03/15/21 0557 03/15/21 1106 03/15/21 1307 03/15/21 1810 03/15/21 2313 03/16/21 0600 03/17/21 0448 03/18/21 0615 03/21/21 0446 03/22/21 0422  BUN 31* 28*  --  31* 34* 36* 36* 31* 26* 17 17  CREATININE 1.04 1.61* 1.50* 1.73* 1.66* 1.56* 1.54* 1.34* 1.11 1.02 0.99  -Recheck renal function in 1 week  Mild transaminitis/hyperbilirubinemia: Likely due to congestive hepatopathy.  Improving -Recheck LFT in 1 week  Uncontrolled IDDM-2 with neuropathy-A1c 7.7% on 4/25. Recent Labs  Lab 03/21/21 1144 03/21/21 1725  03/21/21 2045 03/22/21 0730 03/22/21 1137  GLUCAP 160* 198* 154* 122* 135*  -Added Jardiance. -Continue sliding scale insulin -Patient has history of statin  intolerance.  Anemia/normocytic anemia: Stable. Recent Labs    03/08/21 0538 03/09/21 0226 03/09/21 2336 03/15/21 0557 03/15/21 0828 03/15/21 1106 03/15/21 1349 03/17/21 0448 03/21/21 0446 03/22/21 0422  HGB 9.8* 9.5* 9.9* 11.2* 6.8* 9.8* 10.9* 8.6* 9.5* 9.7*  -Recheck CBC in 1 week  Left foot osteomyelitis s/p left BKA: BKA wound appears stable. Staples in place Debility/physical deconditioning -Continue gabapentin and as needed oxycodone for pain -Shrinker to the amputation stump as they are medicated and if there is dressing beneath it they will not work.  These should be changed out daily and amputation stump cleansed with antibacterial soap and water -Outpatient follow-up with Dr. Lajoyce Cornersuda in 1 week -Continue PT/OT  Anxiety and depression: Stable. -Continue home Cymbalta  Hypokalemia: Likely due to diuretics. -Low-dose Aldactone and p.o. K-Dur 20 mEq twice daily  Hyponatremia: Likely hypervolemic in the setting of CHF -Manage CHF as above  Goals of care: Patient with end-stage heart disease and poor long-term prognosis.  Appropriately DNR/DNI. -Needs palliative follow-up at SNF  Obesity Body mass index is 33.15 kg/m.         Discharge Exam: Vitals:   03/22/21 0518 03/22/21 1040  BP: 109/79 (!) 114/93  Pulse: 77 82  Resp: 17 18  Temp: 98 F (36.7 C) (!) 97.2 F (36.2 C)  SpO2: 98% 100%    GENERAL: No apparent distress.  Nontoxic. HEENT: MMM.  Vision and hearing grossly intact.  NECK: Supple.  No apparent JVD.  RESP: On RA.  No IWOB.  Fair aeration bilaterally. CVS:  RRR. Heart sounds normal.  ABD/GI/GU: Bowel sounds present. Soft. Non tender.  MSK/EXT:  Moves extremities.  Left BKA.  Staples in place.  Slight oozing medially.  2+ pitting edema SKIN: no apparent skin lesion or wound NEURO: Awake and oriented x4.  Not quite alert.  No apparent focal neuro deficit. PSYCH: Calm. Normal affect.   Discharge Instructions  Discharge Instructions     Diet - low sodium heart healthy   Complete by: As directed    Sodium restriction to less than 2 g a day Fluid restriction to less than 1500 cc a day   Diet Carb Modified   Complete by: As directed    Discharge wound care:   Complete by: As directed    Patient should be wearing his vive compression shrinker against stump without any dressing beneath as the shrinker is medicated.  May cleanse amputation stump with antibacterial soap and water.  Should change to clean shrinker daily do not apply any dressings beneath the shrinker   Increase activity slowly   Complete by: As directed      Allergies as of 03/22/2021      Reactions   Codeine Itching, Other (See Comments)   "Allergic," per paperwork from facility   Bee Venom Other (See Comments)   "Feels like his head is on fire when he goes outside in the sun" and "Allergic," per paperwork from facility   Hydrocodone-acetaminophen Itching, Other (See Comments)   "Allergic," per paperwork from facility   Cephalexin Other (See Comments)   No mention of this allergy in notes from 07/2016 admission for osteomyelitis.  Patient was treated with Ancef and Unasyn for broader anaerobic coverage.  Patient is not aware of allergy. -08/02/2020 On 03/15/2021: "Allergic," per paperwork from facility   Vicodin [hydrocodone-acetaminophen] Itching, Other (See  Comments)   "Allergic," per paperwork from facility   Zocor [simvastatin] Other (See Comments)   Myalgias and "Allergic," per paperwork from facility      Medication List    STOP taking these medications   Eliquis 5 MG Tabs tablet Generic drug: apixaban   metFORMIN 500 MG tablet Commonly known as: GLUCOPHAGE   metoprolol succinate 25 MG 24 hr tablet Commonly known as: TOPROL-XL     TAKE these medications   acetaminophen 325 MG tablet Commonly known as: TYLENOL Take 2 tablets (650 mg total) by mouth every 6 (six) hours as needed for mild pain (or Fever >/= 101).   albuterol (2.5  MG/3ML) 0.083% nebulizer solution Commonly known as: PROVENTIL Inhale 3 mLs into the lungs every 4 (four) hours as needed for wheezing or shortness of breath.   DULoxetine 20 MG capsule Commonly known as: CYMBALTA Take 1 capsule (20 mg total) by mouth daily. What changed:   how much to take  when to take this  additional instructions   empagliflozin 10 MG Tabs tablet Commonly known as: JARDIANCE Take 1 tablet (10 mg total) by mouth daily.   folic acid 1 MG tablet Commonly known as: FOLVITE Take 1 mg by mouth daily.   furosemide 20 MG tablet Commonly known as: LASIX Take 1 tablet (20 mg total) by mouth 2 (two) times daily.   gabapentin 100 MG capsule Commonly known as: NEURONTIN Take 100 mg by mouth 3 (three) times daily.   insulin aspart 100 UNIT/ML injection Commonly known as: novoLOG Inject 1-5 Units into the skin 4 (four) times daily -  before meals and at bedtime. CBG 70 - 120: 0 units CBG 121 - 150: 0 units CBG 151 - 200: 0 units CBG 201 - 250: 2 units CBG 251 - 300: 3 units CBG 301 - 350: 4 units CBG 351 - 400: 5 units CBG > 400: call MD and obtain STAT lab verification   midodrine 5 MG tablet Commonly known as: PROAMATINE Take 1 tablet (5 mg total) by mouth 3 (three) times daily with meals. What changed: Another medication with the same name was removed. Continue taking this medication, and follow the directions you see here.   oxyCODONE-acetaminophen 5-325 MG tablet Commonly known as: PERCOCET/ROXICET Take 1 tablet by mouth every 8 (eight) hours as needed for up to 5 days for severe pain. What changed: reasons to take this   pantoprazole 40 MG tablet Commonly known as: PROTONIX Take 1 tablet (40 mg total) by mouth daily.   potassium chloride SA 20 MEQ tablet Commonly known as: KLOR-CON Take 1 tablet (20 mEq total) by mouth 2 (two) times daily.   rivaroxaban 20 MG Tabs tablet Commonly known as: XARELTO Take 20 mg by mouth daily with supper.    spironolactone 25 MG tablet Commonly known as: Aldactone Take 0.5 tablets (12.5 mg total) by mouth daily.   thiamine 100 MG tablet Take 1 tablet (100 mg total) by mouth daily.            Discharge Care Instructions  (From admission, onward)         Start     Ordered   03/22/21 0000  Discharge wound care:       Comments: Patient should be wearing his vive compression shrinker against stump without any dressing beneath as the shrinker is medicated.  May cleanse amputation stump with antibacterial soap and water.  Should change to clean shrinker daily do not apply any dressings beneath the  shrinker   03/22/21 1308          Consultations:  Orthopedic surgery  Procedures/Studies:  none   CT ABDOMEN PELVIS WO CONTRAST  Result Date: 03/15/2021 CLINICAL DATA:  Altered mental status. Abdominal abscess/infection suspected. EXAM: CT ABDOMEN AND PELVIS WITHOUT CONTRAST TECHNIQUE: Multidetector CT imaging of the abdomen and pelvis was performed following the standard protocol without IV contrast. COMPARISON:  None. FINDINGS: Lower chest: Small right and tiny left pleural effusions are noted. Heart is enlarged. Hepatobiliary: No focal abnormality in the liver on this study without intravenous contrast. There is no evidence for gallstones, gallbladder wall thickening, or pericholecystic fluid. No intrahepatic or extrahepatic biliary dilation. Pancreas: No focal mass lesion. No dilatation of the main duct. No intraparenchymal cyst. No peripancreatic edema. Spleen: No splenomegaly. No focal mass lesion. Adrenals/Urinary Tract: No adrenal nodule or mass. Kidneys unremarkable on this noncontrast study. No evidence for hydroureter. Bladder decompressed by Foley catheter. Stomach/Bowel: Stomach is mildly distended with fluid. Duodenum is normally positioned as is the ligament of Treitz. No small bowel wall thickening. No small bowel dilatation. The terminal ileum is normal. The appendix is not well  visualized, but there is no edema or inflammation in the region of the cecum. No gross colonic mass. No colonic wall thickening. Vascular/Lymphatic: There is abdominal aortic atherosclerosis without aneurysm. There is no gastrohepatic or hepatoduodenal ligament lymphadenopathy. No retroperitoneal or mesenteric lymphadenopathy. Upper normal left common iliac nodes identified at 10 mm short axis (image 49/series 3. No pelvic sidewall lymphadenopathy. Reproductive: The prostate gland and seminal vesicles are unremarkable. Other: Small volume free fluid seen around the liver and spleen and in the central pelvis. Musculoskeletal: Diffuse body wall edema evident. Compression deformity noted at L1, L2, L3, and L4. Old bilateral rib fractures noted. No worrisome lytic or sclerotic osseous abnormality. IMPRESSION: 1. No acute findings in the abdomen or pelvis. 2. Small volume ascites with diffuse body wall edema. 3. Small right and tiny left pleural effusions. 4. Multiple compression fractures in the lumbar spine. 5. Aortic Atherosclerosis (ICD10-I70.0). Electronically Signed   By: Kennith Center M.D.   On: 03/15/2021 09:13   DG Tibia/Fibula Left  Result Date: 03/15/2021 CLINICAL DATA:  Status post recent BKA with warm skin. EXAM: LEFT TIBIA AND FIBULA - 2 VIEW COMPARISON:  None. FINDINGS: Three views study shows degenerative changes at the knee in this patient status post below the knee amputation. No gas in the soft tissues of the amputation stump. No bony destruction or lysis to suggest osteomyelitis. IMPRESSION: 1. Status post below the knee amputation. No complicating features by x-ray. 2. Degenerative changes at the knee. Electronically Signed   By: Kennith Center M.D.   On: 03/15/2021 08:22   CT Head Wo Contrast  Result Date: 03/15/2021 CLINICAL DATA:  Mental status change. EXAM: CT HEAD WITHOUT CONTRAST TECHNIQUE: Contiguous axial images were obtained from the base of the skull through the vertex without  intravenous contrast. COMPARISON:  CT 02/14/2021 large FINDINGS: Brain: Large RIGHT MCA territory infarction again noted. No evidence of acute new subcortical hypodensity. No extra-axial fluid collections or intracranial hemorrhage. No intraventricular hemorrhage. No midline shift. Vascular: No hyperdense vessel or unexpected calcification. Skull: Normal. Negative for fracture or focal lesion. Sinuses/Orbits: Paranasal sinuses and mastoid air cells are clear. Orbits are clear. Other: None. IMPRESSION: 1. Large RIGHT MCA territory infarction again noted. 2. No acute intracranial hemorrhage. 3. No new infarction. Electronically Signed   By: Genevive Bi M.D.   On: 03/15/2021  07:44   NM Pulmonary Perfusion  Result Date: 03/15/2021 CLINICAL DATA:  Rule out pulmonary embolus. EXAM: NUCLEAR MEDICINE PERFUSION LUNG SCAN TECHNIQUE: Perfusion images were obtained in multiple projections after intravenous injection of radiopharmaceutical. Ventilation scans intentionally deferred if perfusion scan and chest x-ray adequate for interpretation during COVID 19 epidemic. RADIOPHARMACEUTICALS:  4.2 mCi Tc-62m MAA IV COMPARISON:  Chest radiograph from today FINDINGS: There are no peripheral wedge-shaped perfusion defects to suggest acute pulmonary embolus. IMPRESSION: Negative for acute pulmonary embolus. Electronically Signed   By: Signa Kell M.D.   On: 03/15/2021 10:53   US RENAL  Result Date: 03/15/2021 CLINICAL DATA:  Acute renal failure EXAM: RENAL / URINARY TRACT ULTRASOUND COMPLETE COMPARISON:  CT abdomen pelvis 03/15/2021 FINDINGS: Right Kidney: Renal measurements: 9.9 x 5.3 x 5.5 cm = volume: 150 mL. Echogenicity within normal limits. No mass or hydronephrosis visualized. Left Kidney: Renal measurements: 9.2 x 5.9 x 5.2 cm = volume: 148 mL. Echogenicity within normal limits. No mass or hydronephrosis visualized. Bladder: Not visualized. Other: Minimal bilateral pleural effusions. IMPRESSION: No significant  sonographic abnormality of the kidneys. Electronically Signed   By: Acquanetta Belling M.D.   On: 03/15/2021 11:29   DG Chest Port 1 View  Result Date: 03/16/2021 CLINICAL DATA:  Shortness of breath EXAM: PORTABLE CHEST 1 VIEW COMPARISON:  Mar 15, 2021 FINDINGS: The left central line terminates in the SVC, stable. No pneumothorax. The cardiomediastinal silhouette is stable. Probable atelectasis in the left retrocardiac region. No other acute abnormalities or changes. IMPRESSION: Probable atelectasis in the left retrocardiac region. Stable support apparatus. No other acute abnormalities. Electronically Signed   By: Gerome Sam III M.D   On: 03/16/2021 08:05   DG CHEST PORT 1 VIEW  Result Date: 03/15/2021 CLINICAL DATA:  Status post central line placement. EXAM: PORTABLE CHEST 1 VIEW COMPARISON:  Same day. FINDINGS: Stable cardiomegaly. Interval placement of left internal jugular catheter with distal tip in expected position of SVC. No pneumothorax or pleural effusion is noted. Lungs are clear. Bony thorax is unremarkable. IMPRESSION: Interval placement of left internal jugular catheter as described above. Electronically Signed   By: Lupita Raider M.D.   On: 03/15/2021 16:28   DG Chest Portable 1 View  Result Date: 03/15/2021 CLINICAL DATA:  60 year old male with shortness of breath. Cough and body aches. EXAM: PORTABLE CHEST 1 VIEW COMPARISON:  Portable chest 03/07/2021 and earlier. FINDINGS: Portable AP view at 0555 hours. Stable cardiomegaly and mediastinal contours. Stable lung volumes. Visualized tracheal air column is within normal limits. No pneumothorax, pulmonary edema or consolidation. Hypo ventilation at the left lung base appears stable from recent exams. Right lung base ventilation has improved, probably atelectasis at both sites. No definite pleural effusion. Chronic left posterior rib fractures. Chronic right lateral rib fractures. Chronic left clavicle fracture. No acute osseous abnormality  identified. IMPRESSION: Cardiomegaly with associated mild bibasilar atelectasis. Electronically Signed   By: Odessa Fleming M.D.   On: 03/15/2021 05:59   DG Chest Port 1 View  Result Date: 03/07/2021 CLINICAL DATA:  Shortness of breath EXAM: PORTABLE CHEST 1 VIEW COMPARISON:  Mar 04, 2021 chest radiograph and chest CT February 03, 2021 FINDINGS: There is ill-defined airspace opacity in the right lower lung region. The lungs elsewhere are clear. There is cardiomegaly with pulmonary vascularity normal. There are foci of coronary artery calcification. No adenopathy. There is an old healed fracture of the left clavicle. Prior rib fractures bilaterally noted. Chronic separation right acromioclavicular joint noted. IMPRESSION:  Ill-defined airspace opacity in the right lower lobe region concerning for developing pneumonia. Lungs elsewhere clear. Stable cardiomegaly. Electronically Signed   By: Bretta Bang III M.D.   On: 03/07/2021 08:50   DG Chest Port 1 View  Result Date: 03/04/2021 CLINICAL DATA:  Shortness of breath EXAM: PORTABLE CHEST 1 VIEW COMPARISON:  03/03/2021 FINDINGS: No new consolidation or edema. No pleural effusion. No pneumothorax. Stable cardiomegaly. IMPRESSION: No acute process in the chest.  Cardiomegaly. Electronically Signed   By: Guadlupe Spanish M.D.   On: 03/04/2021 08:23   DG Chest Port 1 View  Result Date: 03/03/2021 CLINICAL DATA:  Shortness of breath EXAM: PORTABLE CHEST 1 VIEW COMPARISON:  Chest radiograph dated 02/26/2021. FINDINGS: The heart is enlarged. The lungs are clear. Degenerative changes are seen in the spine. Chronic right-sided rib fractures and a chronic left clavicle deformity are redemonstrated. IMPRESSION: No active disease.  Cardiomegaly. Electronically Signed   By: Romona Curls M.D.   On: 03/03/2021 12:32   DG Chest Port 1 View  Result Date: 02/26/2021 CLINICAL DATA:  Shortness of breath EXAM: PORTABLE CHEST 1 VIEW COMPARISON:  02/18/2021 chest radiograph FINDINGS:  Unchanged, enlarged cardiac silhouette. There is no new focal airspace disease. No pleural effusion or pneumothorax. No acute osseous abnormality. Old, healed left clavicle injury. Chronic bilateral rib fractures noted. IMPRESSION: Unchanged cardiomegaly.  No new focal airspace disease Electronically Signed   By: Caprice Renshaw   On: 02/26/2021 10:12   DG Swallowing Func-Speech Pathology  Result Date: 03/19/2021 Objective Swallowing Evaluation: Type of Study: MBS-Modified Barium Swallow Study  Patient Details Name: Fernando Maddox MRN: [id number] Date of Birth: Mar 15, 1961 Today's Date: 03/19/2021 Time: SLP Start Time (ACUTE ONLY): 1224 -SLP Stop Time (ACUTE ONLY): 0850 SLP Time Calculation (min) (ACUTE ONLY): 12 min Past Medical History: Past Medical History: Diagnosis Date . Acute combined systolic and diastolic CHF, NYHA class 1 (HCC) 08/07/2016 . Anemia  . Atrial fibrillation (HCC)  . BACK PAIN 02/11/2010  Qualifier: Diagnosis of  By: Manson Passey, RN, BSN, Lauren   . CAD (coronary artery disease) 02/11/2010  Qualifier: Diagnosis of  By: Manson Passey, RN, BSN, Lauren   . Cellulitis 09/29/2017 . Cerebrovascular disease  . CHEST PAIN 02/11/2010  Qualifier: Diagnosis of  By: Manson Passey, RN, BSN, Lauren   . Chronic anticoagulation 03/25/2018 . Chronic pain syndrome  . Chronic systolic congestive heart failure (HCC) 03/25/2018 . Community acquired pneumonia 08/11/2016 . DEPRESSION/ANXIETY 02/11/2010  Qualifier: Diagnosis of  By: Manson Passey, RN, BSN, Lauren   . Diabetes mellitus, type 2 (HCC) 02/11/2010  Qualifier: Diagnosis of  By: Manson Passey, RN, BSN, Lauren   . Diabetic ulcer of toe of right foot associated with type 2 diabetes mellitus (HCC) 10/01/2017 . ESOPHAGEAL STRICTURE 02/11/2010  Qualifier: Diagnosis of  By: Manson Passey, RN, BSN, Lauren   . GERD (gastroesophageal reflux disease)  . HEADACHE 02/11/2010  Qualifier: Diagnosis of  By: Manson Passey, RN, BSN, Lauren   . History of CVA (cerebrovascular accident) 09/29/2017 . History of drug abuse (HCC) 08/05/2016 .  Hypercholesteremia  . Hyperlipemia 02/11/2010  Qualifier: Diagnosis of  By: Manson Passey, RN, BSN, Lauren   . Hyperlipidemia  . Hypertension 02/11/2010  Qualifier: Diagnosis of  By: Manson Passey, RN, BSN, Lauren   . HYPERTENSION, UNSPECIFIED 02/11/2010  Qualifier: Diagnosis of  By: Manson Passey, RN, BSN, Lauren   . Hypogonadism male  . 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 Status post thrombectomy . Osteoarthritis 08/05/2016 . Osteomyelitis,  unspecified (HCC) 10/06/2017 . PAF (paroxysmal atrial fibrillation) (HCC) 08/05/2016 . Polysubstance abuse (HCC) 09/29/2017 . Rotator cuff syndrome   Shoulder . Septic shock (HCC) 02/14/2021 . Shoulder pain  . SIADH (syndrome of inappropriate ADH production) (HCC) 08/07/2016 . Sleep apnea  . Stroke (HCC) 08/05/2016 . Tobacco use 08/11/2016 . Ulcer of left foot with necrosis of bone [facility])  Past Surgical History: Past Surgical History: Procedure Laterality Date . AMPUTATION Left 02/20/2021  Procedure: LEFT TRANSMETATARSAL AMPUTATION;  Surgeon: [name], MD;  Location: [facility] OR;  Service: Orthopedics;  Laterality: Left; . AMPUTATION Left 03/06/2021  Procedure: LEFT BELOW KNEE AMPUTATION;  Surgeon: [name], MD;  Location: [facility] OR;  Service: Orthopedics;  Laterality: Left; . NO PAST SURGERIES   HPI: Patient is a 60 y.o. male with PMH: poorly controlled diabetes, HTN, CVAs, atrial fibrilation, osteomyelitis of left LE post BKA, biventricular systolic heart failure, who was brought to ED with AMS and SOB. He was recently admitted to [facility] from [facility] on 4/21 secondary to wound infection of left foot and had been presenting with AMS likely from sepsis. He underwent a left transmetatarsal amputation on 4/27 but then required left BKA on 5/11 and then was discharged to SNF on 5/13. During hospital stay, SLP performed BSE and recommended MBS, however patient was discharged prior to this being scheduled and  recommendation was for patient to be followed by ST services at [facility].  Subjective: alert, impulsive, confused Assessment / Plan / Recommendation CH[facility] IP CLINICAL IMPRESSIONS 03/18/2021 Clinical Impression Pt demonstrates adequate oral and oropahryngeal function. There was an instance of trace penetration without aspiration after taking large rapid sips with a pill, but otherwise strength and timing WNL. Pt without coughing during assessment. No pulse oximetry available during testing, but pts respiratory pattern appeared WNL. Esophageal sweep achieved to mid esopahgus (leg blocking further sweep) appeared WNL though no radiologist was present to confirm. Recommend upgrade to regualr diet and thin liquids. SLP Visit Diagnosis Dysphagia, unspecified (R13.10) Attention and concentration deficit following -- Frontal lobe and executive function deficit following -- Impact on safety and function Mild aspiration risk   [facility] IP TREATMENT RECOMMENDATION 03/18/2021 Treatment Recommendations No treatment recommended at this time   Prognosis 03/16/2021 Prognosis for Safe Diet Advancement Good Barriers to Reach Goals -- Barriers/Prognosis Comment -- [facility] IP DIET RECOMMENDATION 03/18/2021 SLP Diet Recommendations Regular solids;Thin liquid Liquid Administration via Cup;Straw Medication Administration Whole meds with liquid Compensations Minimize environmental distractions;Slow rate;Small sips/bites Postural Changes Seated upright at 90 degrees   [facility] IP OTHER RECOMMENDATIONS 03/16/2021 Recommended Consults -- Oral Care Recommendations -- Other Recommendations Order thickener from pharmacy;Prohibited food (jello, ice cream, thin soups);Clarify dietary restrictions   [facility] IP FOLLOW UP RECOMMENDATIONS 03/18/2021 Follow up Recommendations None   [facility] IP FREQUENCY AND DURATION 03/16/2021 Speech Therapy Frequency (ACUTE ONLY) min 2x/week Treatment Duration 1 week      [facility]L IP ORAL PHASE 03/18/2021 Oral Phase WFL Oral - Pudding Teaspoon -- Oral -  Pudding Cup -- Oral - Honey Teaspoon -- Oral - Honey Cup -- Oral - Nectar Teaspoon -- Oral - Nectar Cup -- Oral - Nectar Straw -- Oral - Thin Teaspoon -- Oral - Thin Cup -- Oral - Thin Straw -- Oral - Puree -- Oral - Mech Soft -- Oral - Regular -- Oral - Multi-Consistency -- Oral - Pill -- Oral Phase - Comment --  [facility] IP PHARYNGEAL PHASE 03/18/2021 Pharyngeal Phase WFL Pharyngeal- Pudding Teaspoon -- Pharyngeal -- Pharyngeal- Pudding Cup --  Pharyngeal -- Pharyngeal- Honey Teaspoon -- Pharyngeal -- Pharyngeal- Honey Cup -- Pharyngeal -- Pharyngeal- Nectar Teaspoon -- Pharyngeal -- Pharyngeal- Nectar Cup -- Pharyngeal -- Pharyngeal- Nectar Straw -- Pharyngeal -- Pharyngeal- Thin Teaspoon -- Pharyngeal -- Pharyngeal- Thin Cup -- Pharyngeal -- Pharyngeal- Thin Straw -- Pharyngeal -- Pharyngeal- Puree -- Pharyngeal -- Pharyngeal- Mechanical Soft -- Pharyngeal -- Pharyngeal- Regular -- Pharyngeal -- Pharyngeal- Multi-consistency -- Pharyngeal -- Pharyngeal- Pill -- Pharyngeal -- Pharyngeal Comment --  No flowsheet data found. DeBlois, Riley Nearing 03/19/2021, 7:50 AM              UE Venous Duplex ([facility] and [facility] ONLY)  Result Date: 03/15/2021 UPPER VENOUS STUDY  Patient Name:  Fernando Maddox  Date of Exam:   03/15/2021 Medical Rec #: [id number]      Accession #:    [id number] Date of Birth: 05-27-1961      Patient Gender: M Patient Age:   59Y Exam Location:  [facility] Procedure:      VAS [place] UPPER EXTREMITY VENOUS DUPLEX Referring Phys: [id number] [place] Livings DYKSTRA --------------------------------------------------------------------------------  Indications: Edema Risk Factors: None identified. Limitations: Poor ultrasound/tissue interface and patient positioning, patient somnolence. Comparison Study: No prior studies. Performing Technologist: Chanda Busing RVT  Examination Guidelines: A complete evaluation includes B-mode imaging, spectral Doppler, color Doppler, and power Doppler as needed of all accessible  portions of each vessel. Bilateral testing is considered an integral part of a complete examination. Limited examinations for reoccurring indications may be performed as noted.  Right Findings: +----------+------------+---------+-----------+----------+-------+ RIGHT     CompressiblePhasicitySpontaneousPropertiesSummary +----------+------------+---------+-----------+----------+-------+ Subclavian    Full       Yes       Yes                      +----------+------------+---------+-----------+----------+-------+  Left Findings: +----------+------------+---------+-----------+----------+-------+ LEFT      CompressiblePhasicitySpontaneousPropertiesSummary +----------+------------+---------+-----------+----------+-------+ IJV           Full       Yes       Yes                      +----------+------------+---------+-----------+----------+-------+ Subclavian    Full       Yes       Yes                      +----------+------------+---------+-----------+----------+-------+ Axillary      Full       Yes       Yes                      +----------+------------+---------+-----------+----------+-------+ Brachial      Full       Yes       Yes                      +----------+------------+---------+-----------+----------+-------+ Radial        Full                                          +----------+------------+---------+-----------+----------+-------+ Ulnar         Full                                          +----------+------------+---------+-----------+----------+-------+  Cephalic      Full                                          +----------+------------+---------+-----------+----------+-------+ Basilic       Full                                          +----------+------------+---------+-----------+----------+-------+  Summary:  Right: No evidence of thrombosis in the subclavian.  Left: No evidence of deep vein thrombosis in the upper extremity.  No evidence of superficial vein thrombosis in the upper extremity.  *See table(s) above for measurements and observations.  Diagnosing physician: Waverly Ferrari MD Electronically signed by Waverly Ferrari MD on 03/15/2021 at 8:14:44 PM.    Final        The results of significant diagnostics from this hospitalization (including imaging, microbiology, ancillary and laboratory) are listed below for reference.     Microbiology: Recent Results (from the past 240 hour(s))  Culture, blood (routine x 2)     Status: None   Collection Time: 03/15/21  5:38 AM   Specimen: BLOOD  Result Value Ref Range Status   Specimen Description BLOOD RIGHT ANTECUBITAL  Final   Special Requests   Final    BOTTLES DRAWN AEROBIC AND ANAEROBIC Blood Culture adequate volume   Culture   Final    NO GROWTH 5 DAYS Performed at [facility], [address] [place], [place] [address]    Report Status 03/20/2021 FINAL  Final  Urine culture     Status: None   Collection Time: 03/15/21  5:38 AM   Specimen: Urine, Random  Result Value Ref Range Status   Specimen Description URINE, RANDOM  Final   Special Requests NONE  Final   Culture   Final    NO GROWTH Performed at [facility], [address] [place], [place] [address]    Report Status 03/16/2021 FINAL  Final  Culture, blood (routine x 2)     Status: None   Collection Time: 03/15/21  5:43 AM   Specimen: BLOOD  Result Value Ref Range Status   Specimen Description BLOOD BLOOD RIGHT FOREARM  Final   Special Requests   Final    BOTTLES DRAWN AEROBIC AND ANAEROBIC Blood Culture adequate volume   Culture   Final    NO GROWTH 5 DAYS Performed at [facility], [address] [place], [place] [address]    Report Status 03/20/2021 FINAL  Final  Resp Panel by RT-PCR (Flu A&B, Covid) Nasopharyngeal Swab     Status: None   Collection Time: 03/15/21  7:42 AM   Specimen: Nasopharyngeal Swab; Nasopharyngeal(NP) swabs in vial transport medium   Result Value Ref Range Status   SARS Coronavirus 2 by RT PCR NEGATIVE NEGATIVE Final    Comment: (NOTE) SARS-CoV-2 target nucleic acids are NOT DETECTED.  The SARS-CoV-2 RNA is generally detectable in upper respiratory specimens during the acute phase of infection. The lowest concentration of SARS-CoV-2 viral copies this assay can detect is 138 copies/mL. A negative result does not preclude SARS-Cov-2 infection and should not be used as the sole basis for treatment or other patient management decisions. A negative result may occur with  improper specimen collection/handling, submission of specimen other than nasopharyngeal swab,  presence of viral mutation(s) within the areas targeted by this assay, and inadequate number of viral copies(<138 copies/mL). A negative result must be combined with clinical observations, patient history, and epidemiological information. The expected result is Negative.  Fact Sheet for Patients:  BloggerCourse.com  Fact Sheet for Healthcare Providers:  SeriousBroker.it  This test is no t yet approved or cleared by the [place] FDA and  has been authorized for detection and/or diagnosis of SARS-CoV-2 by FDA under an Emergency Use Authorization (EUA). This EUA will remain  in effect (meaning this test can be used) for the duration of the COVID-19 declaration under Section 564(b)(1) of the Act, 21 U.S.C.section 360bbb-3(b)(1), unless the authorization is terminated  or revoked sooner.       Influenza A by PCR NEGATIVE NEGATIVE Final   Influenza B by PCR NEGATIVE NEGATIVE Final    Comment: (NOTE) The Xpert Xpress SARS-CoV-2/FLU/RSV plus assay is intended as an aid in the diagnosis of influenza from Nasopharyngeal swab specimens and should not be used as a sole basis for treatment. Nasal washings and aspirates are unacceptable for Xpert Xpress SARS-CoV-2/FLU/RSV testing.  Fact Sheet for  Patients: BloggerCourse.com  Fact Sheet for Healthcare Providers: SeriousBroker.it  This test is not yet approved or cleared by the [place] FDA and has been authorized for detection and/or diagnosis of SARS-CoV-2 by FDA under an Emergency Use Authorization (EUA). This EUA will remain in effect (meaning this test can be used) for the duration of the COVID-19 declaration under Section 564(b)(1) of the Act, 21 U.S.C. section 360bbb-3(b)(1), unless the authorization is terminated or revoked.  Performed at [facility], [address] [place], [place] [address]   MRSA PCR Screening     Status: None   Collection Time: 03/15/21 12:55 PM   Specimen: Nasal Mucosa; Nasopharyngeal  Result Value Ref Range Status   MRSA by PCR NEGATIVE NEGATIVE Final    Comment:        The GeneXpert MRSA Assay (FDA approved for NASAL specimens only), is one component of a comprehensive MRSA colonization surveillance program. It is not intended to diagnose MRSA infection nor to guide or monitor treatment for MRSA infections. Performed at [facility], [address], [place], [place] [address]      Labs:  CBC: Recent Labs  Lab 03/15/21 1349 03/17/21 0448 03/21/21 0446 03/22/21 0422  WBC  --  9.4 8.2  --   HGB 10.9* 8.6* 9.5* 9.7*  HCT 32.0* 28.4* 29.8* 30.0*  MCV  --  90.4 85.4  --   PLT  --  271 288  --    BMP &GFR Recent Labs  Lab 03/16/21 0600 03/17/21 0448 03/18/21 0615 03/21/21 0446 03/22/21 0422  NA 134* 129* 130* 130* 132*  K 4.3 3.8 3.5 3.2* 3.4*  CL 100 97* 96* 96* 98  CO2 26 26 24 25 26   GLUCOSE 154* 137* 113* 119* 107*  BUN 36* 31* 26* 17 17  CREATININE 1.54* 1.34* 1.11 1.02 0.99  CALCIUM 8.3* 7.9* 8.0* 8.1* 8.1*  MG 1.9 1.9 1.8 1.5* 1.6*  PHOS 4.7* 3.1 3.1 2.8 2.8   Estimated Creatinine Clearance: 112.3 mL/min (by C-G formula based on SCr of 0.99 mg/dL). Liver & Pancreas: Recent Labs  Lab  03/15/21 1810 03/15/21 2313 03/16/21 0600 03/17/21 0448 03/21/21 0446 03/22/21 0422  AST 164* 152* 115* 75* 24  --   ALT 85* 88* 83* 77* 35  --   ALKPHOS 78 75 66 80  85  --   BILITOT 2.9* 2.5* 2.2* 2.1* 1.8*  --   PROT 5.7* 5.6* 5.4* 5.8* 6.0*  --   ALBUMIN 2.2* 2.2* 2.1* 2.1* 2.2* 2.2*   No results for input(s): LIPASE, AMYLASE in the last 168 hours. No results for input(s): AMMONIA in the last 168 hours. Diabetic: No results for input(s): HGBA1C in the last 72 hours. Recent Labs  Lab 03/21/21 1144 03/21/21 1725 03/21/21 2045 03/22/21 0730 03/22/21 1137  GLUCAP 160* 198* 154* 122* 135*   Cardiac Enzymes: No results for input(s): CKTOTAL, CKMB, CKMBINDEX, TROPONINI in the last 168 hours. No results for input(s): PROBNP in the last 8760 hours. Coagulation Profile: Recent Labs  Lab 03/16/21 0600  INR 3.4*   Thyroid Function Tests: No results for input(s): TSH, T4TOTAL, FREET4, T3FREE, THYROIDAB in the last 72 hours. Lipid Profile: No results for input(s): CHOL, HDL, LDLCALC, TRIG, CHOLHDL, LDLDIRECT in the last 72 hours. Anemia Panel: No results for input(s): VITAMINB12, FOLATE, FERRITIN, TIBC, IRON, RETICCTPCT in the last 72 hours. Urine analysis:    Component Value Date/Time   COLORURINE AMBER (A) 03/15/2021 0538   APPEARANCEUR TURBID (A) 03/15/2021 0538   LABSPEC >1.030 (H) 03/15/2021 0538   PHURINE 5.0 03/15/2021 0538   GLUCOSEU NEGATIVE 03/15/2021 0538   HGBUR LARGE (A) 03/15/2021 0538   BILIRUBINUR MODERATE (A) 03/15/2021 0538   KETONESUR NEGATIVE 03/15/2021 0538   PROTEINUR >300 (A) 03/15/2021 0538   NITRITE NEGATIVE 03/15/2021 0538   LEUKOCYTESUR NEGATIVE 03/15/2021 0538   Sepsis Labs: Invalid input(s): PROCALCITONIN, LACTICIDVEN   Time coordinating discharge: 45 minutes  SIGNED:  Almon Hercules, MD  [facility] 03/22/2021, 1:11 PM  If 7PM-7AM, please contact night-coverage [email or web address]

## 2021-03-22 NOTE — Progress Notes (Signed)
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  Patient is lying in bed amputation stump has moderate soft tissue swelling with superficial necrosis but no wound dehiscence.  No ascending cellulitis he does not have his shrinkers on.  He does not know where they are   I will order more shrinkers today.  These are to be applied directly to the amputation stump as they are medicated and if there is dressing beneath it they will not work.  These should be changed out daily and amputation stump cleansed with antibacterial soap and water.  Should follow-up in our office in 1 week after discharge

## 2021-03-22 NOTE — Progress Notes (Signed)
The patient is being transferred out of the hospital and he is being picked up by 3 [employer] employees. No acute distress or any c/o pain.Marland Kitchen

## 2021-03-27 DIAGNOSIS — R451 Restlessness and agitation: Secondary | ICD-10-CM | POA: Diagnosis not present

## 2021-03-27 DIAGNOSIS — G4733 Obstructive sleep apnea (adult) (pediatric): Secondary | ICD-10-CM | POA: Diagnosis not present

## 2021-03-27 DIAGNOSIS — R0602 Shortness of breath: Secondary | ICD-10-CM | POA: Diagnosis not present

## 2021-03-27 DIAGNOSIS — E1165 Type 2 diabetes mellitus with hyperglycemia: Secondary | ICD-10-CM | POA: Diagnosis not present

## 2021-03-28 DIAGNOSIS — F3341 Major depressive disorder, recurrent, in partial remission: Secondary | ICD-10-CM | POA: Diagnosis not present

## 2021-03-28 DIAGNOSIS — E1165 Type 2 diabetes mellitus with hyperglycemia: Secondary | ICD-10-CM | POA: Diagnosis not present

## 2021-03-28 DIAGNOSIS — R451 Restlessness and agitation: Secondary | ICD-10-CM | POA: Diagnosis not present

## 2021-03-28 DIAGNOSIS — I4891 Unspecified atrial fibrillation: Secondary | ICD-10-CM | POA: Diagnosis not present

## 2021-03-28 DIAGNOSIS — G4733 Obstructive sleep apnea (adult) (pediatric): Secondary | ICD-10-CM | POA: Diagnosis not present

## 2021-03-28 DIAGNOSIS — E7800 Pure hypercholesterolemia, unspecified: Secondary | ICD-10-CM | POA: Diagnosis not present

## 2021-04-01 DIAGNOSIS — E11649 Type 2 diabetes mellitus with hypoglycemia without coma: Secondary | ICD-10-CM | POA: Diagnosis not present

## 2021-04-01 DIAGNOSIS — R4182 Altered mental status, unspecified: Secondary | ICD-10-CM | POA: Diagnosis not present

## 2021-04-02 ENCOUNTER — Emergency Department (HOSPITAL_COMMUNITY): Payer: Medicare Other

## 2021-04-02 ENCOUNTER — Inpatient Hospital Stay (HOSPITAL_COMMUNITY): Payer: Medicare Other

## 2021-04-02 ENCOUNTER — Other Ambulatory Visit: Payer: Self-pay

## 2021-04-02 ENCOUNTER — Encounter (HOSPITAL_COMMUNITY): Payer: Self-pay | Admitting: Pulmonary Disease

## 2021-04-02 ENCOUNTER — Inpatient Hospital Stay (HOSPITAL_COMMUNITY)
Admission: EM | Admit: 2021-04-02 | Discharge: 2021-04-06 | DRG: 871 | Disposition: A | Discharge status: Hospice | Payer: Medicare Other | Source: Skilled Nursing Facility | Attending: Pulmonary Disease | Admitting: Pulmonary Disease

## 2021-04-02 ENCOUNTER — Encounter: Payer: Medicare Other | Admitting: Orthopedic Surgery

## 2021-04-02 DIAGNOSIS — I5082 Biventricular heart failure: Secondary | ICD-10-CM | POA: Diagnosis present

## 2021-04-02 DIAGNOSIS — R404 Transient alteration of awareness: Secondary | ICD-10-CM | POA: Diagnosis not present

## 2021-04-02 DIAGNOSIS — R4182 Altered mental status, unspecified: Secondary | ICD-10-CM | POA: Diagnosis not present

## 2021-04-02 DIAGNOSIS — Z9861 Coronary angioplasty status: Secondary | ICD-10-CM

## 2021-04-02 DIAGNOSIS — Z89512 Acquired absence of left leg below knee: Secondary | ICD-10-CM

## 2021-04-02 DIAGNOSIS — J181 Lobar pneumonia, unspecified organism: Secondary | ICD-10-CM | POA: Diagnosis present

## 2021-04-02 DIAGNOSIS — K219 Gastro-esophageal reflux disease without esophagitis: Secondary | ICD-10-CM | POA: Diagnosis present

## 2021-04-02 DIAGNOSIS — Z66 Do not resuscitate: Secondary | ICD-10-CM | POA: Diagnosis present

## 2021-04-02 DIAGNOSIS — Z743 Need for continuous supervision: Secondary | ICD-10-CM | POA: Diagnosis not present

## 2021-04-02 DIAGNOSIS — I2510 Atherosclerotic heart disease of native coronary artery without angina pectoris: Secondary | ICD-10-CM | POA: Diagnosis present

## 2021-04-02 DIAGNOSIS — I2781 Cor pulmonale (chronic): Secondary | ICD-10-CM | POA: Diagnosis present

## 2021-04-02 DIAGNOSIS — Z8673 Personal history of transient ischemic attack (TIA), and cerebral infarction without residual deficits: Secondary | ICD-10-CM | POA: Diagnosis not present

## 2021-04-02 DIAGNOSIS — J90 Pleural effusion, not elsewhere classified: Secondary | ICD-10-CM | POA: Diagnosis not present

## 2021-04-02 DIAGNOSIS — G4733 Obstructive sleep apnea (adult) (pediatric): Secondary | ICD-10-CM | POA: Diagnosis present

## 2021-04-02 DIAGNOSIS — Z87891 Personal history of nicotine dependence: Secondary | ICD-10-CM

## 2021-04-02 DIAGNOSIS — J9601 Acute respiratory failure with hypoxia: Secondary | ICD-10-CM | POA: Diagnosis not present

## 2021-04-02 DIAGNOSIS — K7200 Acute and subacute hepatic failure without coma: Secondary | ICD-10-CM | POA: Diagnosis present

## 2021-04-02 DIAGNOSIS — E1165 Type 2 diabetes mellitus with hyperglycemia: Secondary | ICD-10-CM | POA: Diagnosis present

## 2021-04-02 DIAGNOSIS — R791 Abnormal coagulation profile: Secondary | ICD-10-CM | POA: Diagnosis not present

## 2021-04-02 DIAGNOSIS — I5042 Chronic combined systolic (congestive) and diastolic (congestive) heart failure: Secondary | ICD-10-CM | POA: Diagnosis not present

## 2021-04-02 DIAGNOSIS — A419 Sepsis, unspecified organism: Principal | ICD-10-CM | POA: Diagnosis present

## 2021-04-02 DIAGNOSIS — Z436 Encounter for attention to other artificial openings of urinary tract: Secondary | ICD-10-CM | POA: Diagnosis not present

## 2021-04-02 DIAGNOSIS — I428 Other cardiomyopathies: Secondary | ICD-10-CM

## 2021-04-02 DIAGNOSIS — E878 Other disorders of electrolyte and fluid balance, not elsewhere classified: Secondary | ICD-10-CM | POA: Diagnosis present

## 2021-04-02 DIAGNOSIS — R5381 Other malaise: Secondary | ICD-10-CM | POA: Diagnosis not present

## 2021-04-02 DIAGNOSIS — I081 Rheumatic disorders of both mitral and tricuspid valves: Secondary | ICD-10-CM | POA: Diagnosis present

## 2021-04-02 DIAGNOSIS — S2243XA Multiple fractures of ribs, bilateral, initial encounter for closed fracture: Secondary | ICD-10-CM | POA: Diagnosis not present

## 2021-04-02 DIAGNOSIS — E1122 Type 2 diabetes mellitus with diabetic chronic kidney disease: Secondary | ICD-10-CM | POA: Diagnosis not present

## 2021-04-02 DIAGNOSIS — R410 Disorientation, unspecified: Secondary | ICD-10-CM | POA: Diagnosis not present

## 2021-04-02 DIAGNOSIS — E1169 Type 2 diabetes mellitus with other specified complication: Secondary | ICD-10-CM | POA: Diagnosis not present

## 2021-04-02 DIAGNOSIS — Z885 Allergy status to narcotic agent status: Secondary | ICD-10-CM

## 2021-04-02 DIAGNOSIS — R570 Cardiogenic shock: Secondary | ICD-10-CM | POA: Diagnosis not present

## 2021-04-02 DIAGNOSIS — Z20822 Contact with and (suspected) exposure to covid-19: Secondary | ICD-10-CM | POA: Diagnosis present

## 2021-04-02 DIAGNOSIS — E43 Unspecified severe protein-calorie malnutrition: Secondary | ICD-10-CM | POA: Diagnosis present

## 2021-04-02 DIAGNOSIS — I4821 Permanent atrial fibrillation: Secondary | ICD-10-CM | POA: Diagnosis not present

## 2021-04-02 DIAGNOSIS — E440 Moderate protein-calorie malnutrition: Secondary | ICD-10-CM | POA: Insufficient documentation

## 2021-04-02 DIAGNOSIS — R0902 Hypoxemia: Secondary | ICD-10-CM | POA: Diagnosis not present

## 2021-04-02 DIAGNOSIS — I4891 Unspecified atrial fibrillation: Secondary | ICD-10-CM | POA: Diagnosis not present

## 2021-04-02 DIAGNOSIS — D6489 Other specified anemias: Secondary | ICD-10-CM | POA: Diagnosis present

## 2021-04-02 DIAGNOSIS — E875 Hyperkalemia: Secondary | ICD-10-CM | POA: Diagnosis present

## 2021-04-02 DIAGNOSIS — D649 Anemia, unspecified: Secondary | ICD-10-CM | POA: Diagnosis not present

## 2021-04-02 DIAGNOSIS — R6521 Severe sepsis with septic shock: Secondary | ICD-10-CM | POA: Diagnosis present

## 2021-04-02 DIAGNOSIS — N189 Chronic kidney disease, unspecified: Secondary | ICD-10-CM | POA: Diagnosis not present

## 2021-04-02 DIAGNOSIS — I255 Ischemic cardiomyopathy: Secondary | ICD-10-CM | POA: Diagnosis present

## 2021-04-02 DIAGNOSIS — I5043 Acute on chronic combined systolic (congestive) and diastolic (congestive) heart failure: Secondary | ICD-10-CM | POA: Diagnosis present

## 2021-04-02 DIAGNOSIS — F32A Depression, unspecified: Secondary | ICD-10-CM | POA: Diagnosis present

## 2021-04-02 DIAGNOSIS — E872 Acidosis, unspecified: Secondary | ICD-10-CM

## 2021-04-02 DIAGNOSIS — Z8249 Family history of ischemic heart disease and other diseases of the circulatory system: Secondary | ICD-10-CM

## 2021-04-02 DIAGNOSIS — G9341 Metabolic encephalopathy: Secondary | ICD-10-CM | POA: Diagnosis present

## 2021-04-02 DIAGNOSIS — R579 Shock, unspecified: Secondary | ICD-10-CM | POA: Diagnosis not present

## 2021-04-02 DIAGNOSIS — E1152 Type 2 diabetes mellitus with diabetic peripheral angiopathy with gangrene: Secondary | ICD-10-CM | POA: Diagnosis present

## 2021-04-02 DIAGNOSIS — M1990 Unspecified osteoarthritis, unspecified site: Secondary | ICD-10-CM | POA: Diagnosis present

## 2021-04-02 DIAGNOSIS — I130 Hypertensive heart and chronic kidney disease with heart failure and stage 1 through stage 4 chronic kidney disease, or unspecified chronic kidney disease: Secondary | ICD-10-CM | POA: Diagnosis not present

## 2021-04-02 DIAGNOSIS — M869 Osteomyelitis, unspecified: Secondary | ICD-10-CM | POA: Diagnosis not present

## 2021-04-02 DIAGNOSIS — E1151 Type 2 diabetes mellitus with diabetic peripheral angiopathy without gangrene: Secondary | ICD-10-CM | POA: Diagnosis present

## 2021-04-02 DIAGNOSIS — E161 Other hypoglycemia: Secondary | ICD-10-CM | POA: Diagnosis not present

## 2021-04-02 DIAGNOSIS — G894 Chronic pain syndrome: Secondary | ICD-10-CM | POA: Diagnosis present

## 2021-04-02 DIAGNOSIS — K761 Chronic passive congestion of liver: Secondary | ICD-10-CM | POA: Diagnosis present

## 2021-04-02 DIAGNOSIS — I4811 Longstanding persistent atrial fibrillation: Secondary | ICD-10-CM | POA: Diagnosis not present

## 2021-04-02 DIAGNOSIS — I482 Chronic atrial fibrillation, unspecified: Secondary | ICD-10-CM | POA: Diagnosis present

## 2021-04-02 DIAGNOSIS — K222 Esophageal obstruction: Secondary | ICD-10-CM | POA: Diagnosis not present

## 2021-04-02 DIAGNOSIS — I517 Cardiomegaly: Secondary | ICD-10-CM | POA: Diagnosis not present

## 2021-04-02 DIAGNOSIS — Z7901 Long term (current) use of anticoagulants: Secondary | ICD-10-CM | POA: Diagnosis not present

## 2021-04-02 DIAGNOSIS — R339 Retention of urine, unspecified: Secondary | ICD-10-CM | POA: Diagnosis not present

## 2021-04-02 DIAGNOSIS — J189 Pneumonia, unspecified organism: Secondary | ICD-10-CM | POA: Diagnosis not present

## 2021-04-02 DIAGNOSIS — Z7984 Long term (current) use of oral hypoglycemic drugs: Secondary | ICD-10-CM

## 2021-04-02 DIAGNOSIS — E785 Hyperlipidemia, unspecified: Secondary | ICD-10-CM | POA: Diagnosis present

## 2021-04-02 DIAGNOSIS — R531 Weakness: Secondary | ICD-10-CM

## 2021-04-02 DIAGNOSIS — E7800 Pure hypercholesterolemia, unspecified: Secondary | ICD-10-CM | POA: Diagnosis present

## 2021-04-02 DIAGNOSIS — M79606 Pain in leg, unspecified: Secondary | ICD-10-CM | POA: Diagnosis present

## 2021-04-02 DIAGNOSIS — Z888 Allergy status to other drugs, medicaments and biological substances status: Secondary | ICD-10-CM

## 2021-04-02 DIAGNOSIS — I70202 Unspecified atherosclerosis of native arteries of extremities, left leg: Secondary | ICD-10-CM | POA: Diagnosis present

## 2021-04-02 DIAGNOSIS — Z794 Long term (current) use of insulin: Secondary | ICD-10-CM

## 2021-04-02 DIAGNOSIS — Z515 Encounter for palliative care: Secondary | ICD-10-CM

## 2021-04-02 DIAGNOSIS — Z881 Allergy status to other antibiotic agents status: Secondary | ICD-10-CM

## 2021-04-02 DIAGNOSIS — I5023 Acute on chronic systolic (congestive) heart failure: Secondary | ICD-10-CM | POA: Diagnosis not present

## 2021-04-02 DIAGNOSIS — Z960 Presence of urogenital implants: Secondary | ICD-10-CM | POA: Diagnosis present

## 2021-04-02 DIAGNOSIS — R627 Adult failure to thrive: Secondary | ICD-10-CM | POA: Diagnosis present

## 2021-04-02 DIAGNOSIS — F329 Major depressive disorder, single episode, unspecified: Secondary | ICD-10-CM | POA: Diagnosis not present

## 2021-04-02 DIAGNOSIS — Z452 Encounter for adjustment and management of vascular access device: Secondary | ICD-10-CM

## 2021-04-02 DIAGNOSIS — N179 Acute kidney failure, unspecified: Secondary | ICD-10-CM | POA: Diagnosis present

## 2021-04-02 DIAGNOSIS — Z6828 Body mass index (BMI) 28.0-28.9, adult: Secondary | ICD-10-CM

## 2021-04-02 DIAGNOSIS — I110 Hypertensive heart disease with heart failure: Secondary | ICD-10-CM | POA: Diagnosis present

## 2021-04-02 DIAGNOSIS — Z79899 Other long term (current) drug therapy: Secondary | ICD-10-CM

## 2021-04-02 DIAGNOSIS — E162 Hypoglycemia, unspecified: Secondary | ICD-10-CM | POA: Diagnosis not present

## 2021-04-02 DIAGNOSIS — F419 Anxiety disorder, unspecified: Secondary | ICD-10-CM | POA: Diagnosis present

## 2021-04-02 DIAGNOSIS — E876 Hypokalemia: Secondary | ICD-10-CM | POA: Diagnosis not present

## 2021-04-02 DIAGNOSIS — M533 Sacrococcygeal disorders, not elsewhere classified: Secondary | ICD-10-CM | POA: Diagnosis present

## 2021-04-02 DIAGNOSIS — Z8701 Personal history of pneumonia (recurrent): Secondary | ICD-10-CM | POA: Diagnosis not present

## 2021-04-02 HISTORY — DX: Cardiogenic shock: R57.0

## 2021-04-02 HISTORY — DX: Shock, unspecified: R57.9

## 2021-04-02 LAB — COOXEMETRY PANEL
Carboxyhemoglobin: 1.7 % — ABNORMAL HIGH (ref 0.5–1.5)
Carboxyhemoglobin: 2 % — ABNORMAL HIGH (ref 0.5–1.5)
Methemoglobin: 0.7 % (ref 0.0–1.5)
Methemoglobin: 1.1 % (ref 0.0–1.5)
O2 Saturation: 65.9 %
O2 Saturation: 71.7 %
Total hemoglobin: 10 g/dL — ABNORMAL LOW (ref 12.0–16.0)
Total hemoglobin: 9.3 g/dL — ABNORMAL LOW (ref 12.0–16.0)

## 2021-04-02 LAB — BASIC METABOLIC PANEL
Anion gap: 16 — ABNORMAL HIGH (ref 5–15)
Anion gap: 10 (ref 5–15)
BUN: 40 mg/dL — ABNORMAL HIGH (ref 6–20)
BUN: 42 mg/dL — ABNORMAL HIGH (ref 6–20)
CO2: 20 mmol/L — ABNORMAL LOW (ref 22–32)
CO2: 29 mmol/L (ref 22–32)
Calcium: 8.5 mg/dL — ABNORMAL LOW (ref 8.9–10.3)
Calcium: 8.3 mg/dL — ABNORMAL LOW (ref 8.9–10.3)
Chloride: 96 mmol/L — ABNORMAL LOW (ref 98–111)
Chloride: 94 mmol/L — ABNORMAL LOW (ref 98–111)
Creatinine, Ser: 1.81 mg/dL — ABNORMAL HIGH (ref 0.61–1.24)
Creatinine, Ser: 1.64 mg/dL — ABNORMAL HIGH (ref 0.61–1.24)
GFR, Estimated: 43 mL/min — ABNORMAL LOW (ref >60)
GFR, Estimated: 48 mL/min — ABNORMAL LOW (ref >60)
Glucose, Bld: 189 mg/dL — ABNORMAL HIGH (ref 70–99)
Glucose, Bld: 144 mg/dL — ABNORMAL HIGH (ref 70–99)
Potassium: 5.5 mmol/L — ABNORMAL HIGH (ref 3.5–5.1)
Potassium: 4.6 mmol/L (ref 3.5–5.1)
Sodium: 132 mmol/L — ABNORMAL LOW (ref 135–145)
Sodium: 133 mmol/L — ABNORMAL LOW (ref 135–145)

## 2021-04-02 LAB — COMPREHENSIVE METABOLIC PANEL
ALT: 133 U/L — ABNORMAL HIGH (ref 0–44)
AST: 549 U/L — ABNORMAL HIGH (ref 15–41)
Albumin: 2.6 g/dL — ABNORMAL LOW (ref 3.5–5.0)
Alkaline Phosphatase: 92 U/L (ref 38–126)
Anion gap: 25 — ABNORMAL HIGH (ref 5–15)
BUN: 33 mg/dL — ABNORMAL HIGH (ref 6–20)
CO2: 11 mmol/L — ABNORMAL LOW (ref 22–32)
Calcium: 8.9 mg/dL (ref 8.9–10.3)
Chloride: 92 mmol/L — ABNORMAL LOW (ref 98–111)
Creatinine, Ser: 1.55 mg/dL — ABNORMAL HIGH (ref 0.61–1.24)
GFR, Estimated: 51 mL/min — ABNORMAL LOW (ref >60)
Glucose, Bld: 319 mg/dL — ABNORMAL HIGH (ref 70–99)
Sodium: 128 mmol/L — ABNORMAL LOW (ref 135–145)
Total Bilirubin: 5.4 mg/dL — ABNORMAL HIGH (ref 0.3–1.2)
Total Protein: 6.2 g/dL — ABNORMAL LOW (ref 6.5–8.1)

## 2021-04-02 LAB — URINALYSIS, ROUTINE W REFLEX MICROSCOPIC
Bilirubin Urine: NEGATIVE
Glucose, UA: >=500 mg/dL — AB
Ketones, ur: NEGATIVE mg/dL
Leukocytes,Ua: NEGATIVE
Nitrite: NEGATIVE
Protein, ur: NEGATIVE mg/dL
Specific Gravity, Urine: 1.005 (ref 1.005–1.030)
pH: 5 (ref 5.0–8.0)

## 2021-04-02 LAB — ECHOCARDIOGRAM LIMITED
MV M vel: 4.1 m/s
MV Peak grad: 67.2 mmHg
S' Lateral: 5.2 cm
Single Plane A4C EF: 23.5 %

## 2021-04-02 LAB — CBC
HCT: 32.7 % — ABNORMAL LOW (ref 39.0–52.0)
Hemoglobin: 9.7 g/dL — ABNORMAL LOW (ref 13.0–17.0)
MCH: 26.6 pg (ref 26.0–34.0)
MCHC: 29.7 g/dL — ABNORMAL LOW (ref 30.0–36.0)
MCV: 89.8 fL (ref 80.0–100.0)
Platelets: 289 10*3/uL (ref 150–400)
RBC: 3.64 MIL/uL — ABNORMAL LOW (ref 4.22–5.81)
RDW: 19.4 % — ABNORMAL HIGH (ref 11.5–15.5)
WBC: 14.5 10*3/uL — ABNORMAL HIGH (ref 4.0–10.5)
nRBC: 0 % (ref 0.0–0.2)

## 2021-04-02 LAB — GLUCOSE, CAPILLARY
Glucose-Capillary: 212 mg/dL — ABNORMAL HIGH (ref 70–99)
Glucose-Capillary: 203 mg/dL — ABNORMAL HIGH (ref 70–99)
Glucose-Capillary: 122 mg/dL — ABNORMAL HIGH (ref 70–99)

## 2021-04-02 LAB — LACTIC ACID, PLASMA
Lactic Acid, Venous: >11 mmol/L (ref 0.5–1.9)
Lactic Acid, Venous: >11 mmol/L (ref 0.5–1.9)
Lactic Acid, Venous: 7.7 mmol/L (ref 0.5–1.9)
Lactic Acid, Venous: 5.5 mmol/L (ref 0.5–1.9)

## 2021-04-02 LAB — MRSA PCR SCREENING: MRSA by PCR: NEGATIVE

## 2021-04-02 LAB — BLOOD GAS, VENOUS
Acid-base deficit: 14.7 mmol/L — ABNORMAL HIGH (ref 0.0–2.0)
Bicarbonate: 11.6 mmol/L — ABNORMAL LOW (ref 20.0–28.0)
O2 Saturation: 73.3 %
Patient temperature: 98.6
pCO2, Ven: 29.5 mmHg — ABNORMAL LOW (ref 44.0–60.0)
pH, Ven: 7.219 — ABNORMAL LOW (ref 7.250–7.430)
pO2, Ven: 52.8 mmHg — ABNORMAL HIGH (ref 32.0–45.0)

## 2021-04-02 LAB — AMMONIA: Ammonia: 27 umol/L (ref 9–35)

## 2021-04-02 LAB — PROCALCITONIN: Procalcitonin: 2.99 ng/mL

## 2021-04-02 LAB — APTT: aPTT: >200 seconds (ref 24–36)

## 2021-04-02 LAB — BRAIN NATRIURETIC PEPTIDE: B Natriuretic Peptide: 1993.9 pg/mL — ABNORMAL HIGH (ref 0.0–100.0)

## 2021-04-02 LAB — CBG MONITORING, ED
Glucose-Capillary: 96 mg/dL (ref 70–99)
Glucose-Capillary: 133 mg/dL — ABNORMAL HIGH (ref 70–99)

## 2021-04-02 LAB — MAGNESIUM: Magnesium: 1.9 mg/dL (ref 1.7–2.4)

## 2021-04-02 LAB — SARS CORONAVIRUS 2 (TAT 6-24 HRS): SARS Coronavirus 2: NEGATIVE

## 2021-04-02 MED ORDER — SODIUM ZIRCONIUM CYCLOSILICATE 5 G PO PACK
5.0000 g | PACK | Freq: Once | ORAL | Status: DC
  Filled 2021-04-02: qty 1

## 2021-04-02 MED ORDER — INSULIN ASPART 100 UNIT/ML IJ SOLN
0.0000 [IU] | Freq: Three times a day (TID) | INTRAMUSCULAR | Status: DC
  Administered 2021-04-02 (×2): 5 [IU] via SUBCUTANEOUS
  Administered 2021-04-03 (×2): 2 [IU] via SUBCUTANEOUS
  Administered 2021-04-03 – 2021-04-04 (×2): 3 [IU] via SUBCUTANEOUS
  Administered 2021-04-04: 2 [IU] via SUBCUTANEOUS
  Administered 2021-04-04: 3 [IU] via SUBCUTANEOUS
  Administered 2021-04-05: 5 [IU] via SUBCUTANEOUS

## 2021-04-02 MED ORDER — SODIUM CHLORIDE 0.9% FLUSH
3.0000 mL | Freq: Two times a day (BID) | INTRAVENOUS | Status: DC
  Administered 2021-04-02 – 2021-04-05 (×7): 3 mL via INTRAVENOUS

## 2021-04-02 MED ORDER — DOCUSATE SODIUM 100 MG PO CAPS: 100.0000 mg | ORAL_CAPSULE | Freq: Two times a day (BID) | ORAL | Status: DC | PRN

## 2021-04-02 MED ORDER — DEXTROSE 10 % IV SOLN
100.0000 mL | Freq: Once | INTRAVENOUS | Status: AC
Start: 2021-04-02 — End: 2021-04-02
  Administered 2021-04-02: 100 mL via INTRAVENOUS
  Filled 2021-04-02: qty 500

## 2021-04-02 MED ORDER — DEXTROSE 5 % IV SOLN
250.0000 mg | INTRAVENOUS | Status: DC
  Administered 2021-04-02 – 2021-04-05 (×4): 250 mg via INTRAVENOUS
  Filled 2021-04-02 (×7): qty 250

## 2021-04-02 MED ORDER — MILRINONE LACTATE IN DEXTROSE 20-5 MG/100ML-% IV SOLN
0.3750 ug/kg/min | INTRAVENOUS | Status: DC
  Administered 2021-04-02 – 2021-04-03 (×3): 0.25 ug/kg/min via INTRAVENOUS
  Administered 2021-04-04: 0.375 ug/kg/min via INTRAVENOUS
  Administered 2021-04-04: 0.25 ug/kg/min via INTRAVENOUS
  Administered 2021-04-04 – 2021-04-05 (×3): 0.375 ug/kg/min via INTRAVENOUS
  Filled 2021-04-02 (×10): qty 100

## 2021-04-02 MED ORDER — VANCOMYCIN HCL 1750 MG/350ML IV SOLN: 1750.0000 mg | INTRAVENOUS | Status: DC

## 2021-04-02 MED ORDER — ORAL CARE MOUTH RINSE
15.0000 mL | Freq: Two times a day (BID) | OROMUCOSAL | Status: DC
  Administered 2021-04-02 – 2021-04-05 (×8): 15 mL via OROMUCOSAL

## 2021-04-02 MED ORDER — SODIUM CHLORIDE 0.9% FLUSH
10.0000 mL | Freq: Two times a day (BID) | INTRAVENOUS | Status: DC
  Administered 2021-04-02 – 2021-04-05 (×7): 10 mL

## 2021-04-02 MED ORDER — SODIUM CHLORIDE 0.9 % IV SOLN: 250.0000 mL | INTRAVENOUS | Status: DC | PRN

## 2021-04-02 MED ORDER — FUROSEMIDE 10 MG/ML IJ SOLN
60.0000 mg | Freq: Four times a day (QID) | INTRAMUSCULAR | Status: DC
  Administered 2021-04-02 – 2021-04-04 (×8): 60 mg via INTRAVENOUS
  Filled 2021-04-02 (×8): qty 6

## 2021-04-02 MED ORDER — SODIUM CHLORIDE 0.9% FLUSH: 3.0000 mL | INTRAVENOUS | Status: DC | PRN

## 2021-04-02 MED ORDER — ONDANSETRON HCL 4 MG/2ML IJ SOLN
INTRAMUSCULAR | Status: AC
  Administered 2021-04-02: 4 mg via INTRAVENOUS
  Filled 2021-04-02: qty 2

## 2021-04-02 MED ORDER — POLYETHYLENE GLYCOL 3350 17 G PO PACK: 17.0000 g | PACK | Freq: Every day | ORAL | Status: DC | PRN

## 2021-04-02 MED ORDER — HEPARIN (PORCINE) 25000 UT/250ML-% IV SOLN
1500.0000 [IU]/h | INTRAVENOUS | Status: DC
  Administered 2021-04-02: 1500 [IU]/h via INTRAVENOUS
  Filled 2021-04-02: qty 250

## 2021-04-02 MED ORDER — VANCOMYCIN HCL 1250 MG/250ML IV SOLN
1250.0000 mg | INTRAVENOUS | Status: DC
  Administered 2021-04-03: 1250 mg via INTRAVENOUS
  Filled 2021-04-02: qty 250

## 2021-04-02 MED ORDER — CHLORHEXIDINE GLUCONATE CLOTH 2 % EX PADS
6.0000 | MEDICATED_PAD | Freq: Every day | CUTANEOUS | Status: DC
  Administered 2021-04-02 – 2021-04-05 (×4): 6 via TOPICAL

## 2021-04-02 MED ORDER — VANCOMYCIN HCL 2000 MG/400ML IV SOLN
2000.0000 mg | Freq: Once | INTRAVENOUS | Status: AC
  Administered 2021-04-02: 2000 mg via INTRAVENOUS
  Filled 2021-04-02: qty 400

## 2021-04-02 MED ORDER — SODIUM CHLORIDE 0.9 % IV SOLN
2.0000 g | Freq: Three times a day (TID) | INTRAVENOUS | Status: DC
  Administered 2021-04-02 – 2021-04-05 (×11): 2 g via INTRAVENOUS
  Filled 2021-04-02 (×12): qty 2

## 2021-04-02 MED ORDER — ONDANSETRON HCL 4 MG/2ML IJ SOLN: 4.0000 mg | Freq: Once | INTRAMUSCULAR | Status: AC

## 2021-04-02 MED ORDER — FENTANYL CITRATE (PF) 100 MCG/2ML IJ SOLN: 50.0000 ug | Freq: Once | INTRAMUSCULAR | Status: DC

## 2021-04-02 MED ORDER — SODIUM BICARBONATE 8.4 % IV SOLN
INTRAVENOUS | Status: DC
  Filled 2021-04-02: qty 1000
  Filled 2021-04-02: qty 150
  Filled 2021-04-02 (×2): qty 1000

## 2021-04-02 MED ORDER — SODIUM CHLORIDE 0.9% FLUSH: 10.0000 mL | INTRAVENOUS | Status: DC | PRN

## 2021-04-02 MED ORDER — PIPERACILLIN-TAZOBACTAM 3.375 G IVPB 30 MIN
3.3750 g | Freq: Once | INTRAVENOUS | Status: AC
  Administered 2021-04-02: 3.375 g via INTRAVENOUS
  Filled 2021-04-02: qty 50

## 2021-04-02 NOTE — Procedures (Signed)
Central Venous Catheter Insertion Procedure Note  Fernando Maddox  [id number]  May 05, 1961  Date:04/02/21  Time:8:56 AM   Provider Performing:Amairany Schumpert R Shawnee Gambone   Procedure: Insertion of Non-tunneled Central Venous Catheter(36556) with US guidance (93570)   Indication(s) Medication administration and Difficult access  Consent Unable to obtain consent due to emergent nature of procedure.  Anesthesia Topical only with 1% lidocaine   Timeout Verified patient identification, verified procedure, site/side was marked, verified correct patient position, special equipment/implants available, medications/allergies/relevant history reviewed, required imaging and test results available.  Sterile Technique Maximal sterile technique including full sterile barrier drape, hand hygiene, sterile gown, sterile gloves, mask, hair covering, sterile ultrasound probe cover (if used).  Procedure Description Area of catheter insertion was cleaned with chlorhexidine and draped in sterile fashion.  With real-time ultrasound guidance a central venous catheter was placed into the right internal jugular vein. Nonpulsatile blood flow and easy flushing noted in all ports.  The catheter was sutured in place and sterile dressing applied.  Complications/Tolerance None; patient tolerated the procedure well. Chest X-ray is ordered to verify placement for internal jugular or subclavian cannulation.   Chest x-ray is not ordered for femoral cannulation.  EBL Minimal  Specimen(s) None  [name], PA-C

## 2021-04-02 NOTE — Progress Notes (Signed)
PCCM Interval Note  60 year old male SNF who presented for confusion admitted for undifferentiated shock. Labs significant for lactic acidosis and hypoglycemia. PCCM consulted for shock.  Blood pressure 103/87, pulse (!) 104, temperature 97.6 F (36.4 C), temperature source Oral, resp. rate 14, SpO2 99 %. On exam, chronically ill appearing male, lethargic, will awaken with intense tactile stimulation and follow simple commands however will fall back asleep. Withdraws x 4 to noxious stimuli. Lungs with coarse breath sounds bilaterally, no wheezing. Heart tachycardic to 110s, irregular rate. Anasarca with 2-3+ pitting edema. S/p left BKA  BMET    Component Value Date/Time   NA 128 (L) 04/02/2021 0500   K  04/02/2021 0500    SPECIMEN HEMOLYZED. HEMOLYSIS MAY AFFECT INTEGRITY OF RESULTS.   CL 92 (L) 04/02/2021 0500   CO2 11 (L) 04/02/2021 0500   GLUCOSE 319 (H) 04/02/2021 0500   BUN 33 (H) 04/02/2021 0500   CREATININE 1.55 (H) 04/02/2021 0500   CALCIUM 8.9 04/02/2021 0500   GFRNONAA 51 (L) 04/02/2021 0500   GFRAA  04/03/2007 0425    >60        The eGFR has been calculated using the MDRD equation. This calculation has not been validated in all clinical   CBC    Component Value Date/Time   WBC 8.2 03/21/2021 0446   RBC 3.49 (L) 03/21/2021 0446   HGB 9.7 (L) 03/22/2021 0422   HCT 30.0 (L) 03/22/2021 0422   PLT 288 03/21/2021 0446   MCV 85.4 03/21/2021 0446   MCH 27.2 03/21/2021 0446   MCHC 31.9 03/21/2021 0446   RDW 19.8 (H) 03/21/2021 0446   LYMPHSABS 1.0 03/15/2021 0557   MONOABS 1.2 (H) 03/15/2021 0557   EOSABS 0.0 03/15/2021 0557   BASOSABS 0.0 03/15/2021 0557   ABG    Component Value Date/Time   PHART 7.287 (L) 03/15/2021 1349   PCO2ART 19.7 (LL) 03/15/2021 1349   PO2ART 105 03/15/2021 1349   HCO3 11.6 (L) 04/02/2021 0553   TCO2 10 (L) 03/15/2021 1349   ACIDBASEDEF 14.7 (H) 04/02/2021 0553   O2SAT 73.3 04/02/2021 0553   CT Head 04/02/21 - Large remote right MCA. No  acute intracranial abnormalities CT Chest 04/02/21 - Multifocal airspace disease bilaterally, moderate right pleural fluid  Assessment/Plan  Undifferentiated shock, suspect septic + cardiogenic Volume overload secondary cardiogenic shock, third spacing from severe protein malnutrition --If worsening hypotension, start levophed for MAP goal >65 --Continue broad spectrum antibiotics --Place central line --Check CVP, will likely need vasopressor support for diuresis --Follow-up cultures --Trend LA  AKI Metabolic acidosis with respiratory compensation Lactic acidosis --Shock management as above --Monitor UOP/Cr  Acute encephalopathy CT head negative --Supportive care  [facility] to [facility] for ICU bed placement.  The patient is critically ill with multiple organ systems failure and requires high complexity decision making for assessment and support, frequent evaluation and titration of therapies, application of advanced monitoring technologies and extensive interpretation of multiple databases.  Independent Critical Care Time: 78 Minutes.   Rodman Pickle, M.D. [facility] Pulmonary/Critical Care Medicine 04/02/2021 10:10 AM   Please see Amion for pager number to reach on-call Pulmonary and Critical Care Team.

## 2021-04-02 NOTE — H&P (Addendum)
NAME:  Fernando Maddox, MRN:  [id number], DOB:  01-24-1961, LOS: 0 ADMISSION DATE:  04/02/2021, CONSULTATION DATE:  04/02/21 REFERRING MD:  ER, CHIEF COMPLAINT:  FTT   History of Present Illness:  60 year old man with recent admission for likely cardiogenic shock presenting from SNF with FTT, anorexia, developing confusion.  Found to be hypoglycemic with lactate > 11.  PCCM consulted for admission.  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 04/02/21 ER, CT Head>>  Interim History / Subjective:  Admitting  Objective   Blood pressure 118/84, pulse (!) 117, temperature 97.6 F (36.4 C), temperature source Oral, resp. rate (!) 24, SpO2 97 %.       No intake or output data in the 24 hours ending 04/02/21 0609 There were no vitals filed for this visit.  Examination: Constitutional: ill appearing man lying in bed moaning with IV checks  Eyes: L>R pupils, reactive Ears, nose, mouth, and throat: MM dry, trachea midline Cardiovascular: tachycardic, bedside US with more squeeze then expected (25-30%), RV not pumping too well, very limited views; ext cool to touch, marked anasarca Respiratory: Diminished at bases with crackles Gastrointestinal: soft, hypoactive BS, nontender Skin: chronic vascular changes, L AKA, R with several missing  toes Neurologic: withdraws x 4, moans, occasionally makes sense with words Psychiatric: RASS -2   Labs/imaging that I havepersonally reviewed  (right click and "Reselect all SmartList Selections" daily)  Lactate > 11 BNP 1999 from 1400 on DC CXR atypical pulmonary edema vs. Multifocal infiltrates Recent MBSS with mild aspiration risk  Resolved Hospital Problem list   n/a  Assessment & Plan:  Acute likely cardiogenic shock Question component septic from aspiration (vs. Atypical pulmonary edema) Chronic afib on AC PTA Question of SIADH PTA Anemia Toxic metabolic encephalopathy- CT Head pending, nonfocal exam Anion gap acidemia from lactic acidosis, no s/s of intestinal ischemia, suspect low flow state Muscular deconditioning Volume overloaded state of heart Recurrent admissions  Lots of labs pending  - f/u BMP, CBC, repeat lactate - Get weight to compare to prior admits, he appears markedly up - Start bicarb drip - Stat limited echo - Will need central line, coox and dobutamine; consideration for [facility] transfer for CHF team evaluation - Add CT chest to CT head - Hold AC until CT Head - Broad spectrum abx - Guarded prognosis, needs frank GOC discussion  Best practice (right click and "Reselect all SmartList Selections" daily)  Diet:  NPO Pain/Anxiety/Delirium protocol (if indicated): No VAP protocol (if indicated): Not indicated DVT prophylaxis: SCD GI prophylaxis: N/A Glucose control:  SSI Yes Central venous access:  Yes, and it is still needed Arterial line:  N/A Foley:  N/A Mobility:  bed rest  PT consulted: N/A Last date of multidisciplinary goals of care discussion [pending, will put in palliative consult, this patient is not going to do well] Code Status:  full code Disposition: ICU  Labs  CBC: No results for input(s): WBC, NEUTROABS, HGB, HCT, MCV, PLT in the last 168 hours.  Basic Metabolic Panel: No results for input(s): NA, K, CL, CO2, GLUCOSE, BUN,  CREATININE, CALCIUM, MG, PHOS in the last 168 hours. GFR: Estimated Creatinine Clearance: 112.3 mL/min (by C-G formula based on SCr of 0.99 mg/dL). Recent Labs  Lab 04/02/21 0346  LATICACIDVEN >11.0*    Liver Function Tests: No results for input(s): AST, ALT, ALKPHOS, BILITOT, PROT, ALBUMIN in the last 168 hours. No results for input(s): LIPASE, AMYLASE in the last 168 hours. No results for input(s): AMMONIA in the last 168 hours.  ABG    Component Value Date/Time   PHART 7.287 (L) 03/15/2021 1349   PCO2ART 19.7 (LL) 03/15/2021 1349   PO2ART 105 03/15/2021 1349   HCO3 11.6 (L) 04/02/2021 0553   TCO2 10 (L) 03/15/2021 1349   ACIDBASEDEF 14.7 (H) 04/02/2021 0553   O2SAT 73.3 04/02/2021 0553     Coagulation Profile: No results for input(s): INR, PROTIME in the last 168 hours.  Cardiac Enzymes: No results for input(s): CKTOTAL, CKMB, CKMBINDEX, TROPONINI in the last 168 hours.  HbA1C: Hgb A1c MFr Bld  Date/Time Value Ref Range Status  02/18/2021 09:40 AM 7.7 (H) 4.8 - 5.6 % Final    Comment:    (NOTE) Pre diabetes:          5.7%-6.4%  Diabetes:              >6.4%  Glycemic control for   <7.0% adults with diabetes   02/16/2021 06:22 AM 7.8 (H) 4.8 - 5.6 % Final    Comment:    (NOTE) Pre diabetes:          5.7%-6.4%  Diabetes:              >6.4%  Glycemic control for   <7.0% adults with diabetes     CBG: Recent Labs  Lab 04/02/21 0344 04/02/21 0506  GLUCAP 96 133*    Review of Systems:   Limited by encephalopathy  Past Medical History:  He,  has a past medical history of Acute combined systolic and diastolic CHF, NYHA class 1 (HCC) (08/07/2016), Anemia, Atrial fibrillation (HCC), BACK PAIN (02/11/2010), CAD (coronary artery disease) (02/11/2010), Cellulitis (09/29/2017), Cerebrovascular disease, CHEST PAIN (02/11/2010), Chronic anticoagulation (03/25/2018), Chronic pain syndrome, Chronic systolic congestive heart failure (HCC) (03/25/2018), Community acquired  pneumonia (08/11/2016), DEPRESSION/ANXIETY (02/11/2010), Diabetes mellitus, type 2 (HCC) (02/11/2010), Diabetic ulcer of toe of right foot associated with type 2 diabetes mellitus (HCC) (10/01/2017), ESOPHAGEAL STRICTURE (02/11/2010), GERD (gastroesophageal reflux disease), HEADACHE (02/11/2010), History of CVA (cerebrovascular accident) (09/29/2017), History of drug abuse (HCC) (08/05/2016), Hypercholesteremia, Hyperlipemia (02/11/2010), Hyperlipidemia, Hypertension (02/11/2010), HYPERTENSION, UNSPECIFIED (02/11/2010), Hypogonadism male, 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   Surgical History:   Past Surgical History:  Procedure Laterality Date  . AMPUTATION Left 02/20/2021   Procedure: LEFT TRANSMETATARSAL AMPUTATION;  Surgeon: [name], MD;  Location: [facility] OR;  Service: Orthopedics;  Laterality: Left;  . AMPUTATION Left 03/06/2021   Procedure: LEFT BELOW KNEE AMPUTATION;  Surgeon: [name], MD;  Location: [facility] OR;  Service: Orthopedics;  Laterality: Left;  . NO PAST SURGERIES  Social History:   reports that he has quit smoking. His smoking use included cigarettes. He started smoking about 4 years ago. He quit smokeless tobacco use about 5 years ago.   Family History:  His family history includes Hypertension in his father and mother.   Allergies Allergies  Allergen Reactions  . Codeine Itching and Other (See Comments)    "Allergic," per paperwork from facility  . Bee Venom Other (See Comments)    "Feels like his head is on fire when he goes  outside in the sun" and "Allergic," per paperwork from facility  . Hydrocodone-Acetaminophen Itching and Other (See Comments)    "Allergic," per paperwork from facility  . Cephalexin Other (See Comments)    No mention of this allergy in notes from 07/2016 admission for osteomyelitis.  Patient was treated with Ancef and Unasyn for broader anaerobic coverage.  Patient is not aware of allergy. -08/02/2020  On 03/15/2021: "Allergic," per paperwork from facility  . Vicodin [Hydrocodone-Acetaminophen] Itching and Other (See Comments)    "Allergic," per paperwork from facility  . Zocor [Simvastatin] Other (See Comments)    Myalgias and "Allergic," per paperwork from facility     Home Medications  Prior to Admission medications   Medication Sig Start Date End Date Taking? Authorizing Provider  acetaminophen (TYLENOL) 325 MG tablet Take 2 tablets (650 mg total) by mouth every 6 (six) hours as needed for mild pain (or Fever >/= 101). 03/08/21   Leroy Sea, MD  albuterol (PROVENTIL) (2.5 MG/3ML) 0.083% nebulizer solution Inhale 3 mLs into the lungs every 4 (four) hours as needed for wheezing or shortness of breath. Patient taking differently: Inhale 2.5 mg into the lungs every 4 (four) hours as needed for wheezing or shortness of breath. 03/08/21   Leroy Sea, MD  DULoxetine (CYMBALTA) 20 MG capsule Take 1 capsule (20 mg total) by mouth daily. 03/22/21   Almon Hercules, MD  empagliflozin (JARDIANCE) 10 MG TABS tablet Take 1 tablet (10 mg total) by mouth daily. 03/22/21   Almon Hercules, MD  folic acid (FOLVITE) 1 MG tablet Take 1 mg by mouth daily.    [provider]  furosemide (LASIX) 20 MG tablet Take 1 tablet (20 mg total) by mouth 2 (two) times daily. 03/20/21   Almon Hercules, MD  gabapentin (NEURONTIN) 100 MG capsule Take 100 mg by mouth 3 (three) times daily.    [provider]  insulin aspart (NOVOLOG) 100 UNIT/ML injection Inject 1-5 Units into the skin 4 (four) times  daily -  before meals and at bedtime. CBG 70 - 120: 0 units CBG 121 - 150: 0 units CBG 151 - 200: 0 units CBG 201 - 250: 2 units CBG 251 - 300: 3 units CBG 301 - 350: 4 units CBG 351 - 400: 5 units CBG > 400: call MD and obtain STAT lab verification 03/20/21   Almon Hercules, MD  midodrine (PROAMATINE) 5 MG tablet Take 1 tablet (5 mg total) by mouth 3 (three) times daily with meals. Patient not taking: Reported on 03/18/2021 03/09/21   Leroy Sea, MD  pantoprazole (PROTONIX) 40 MG tablet Take 1 tablet (40 mg total) by mouth daily. 03/08/21   Leroy Sea, MD  potassium chloride SA (KLOR-CON) 20 MEQ tablet Take 1 tablet (20 mEq total) by mouth 2 (two) times daily. 03/22/21   Almon Hercules, MD  rivaroxaban (XARELTO) 20 MG TABS tablet Take 20 mg by mouth daily with supper.  Patient not taking: Reported on 03/18/2021    [provider]  spironolactone (ALDACTONE) 25 MG tablet Take 0.5 tablets (12.5 mg total) by mouth daily. 03/22/21 03/22/22  Almon Hercules, MD  thiamine 100 MG tablet Take 1 tablet (100 mg total) by mouth daily. 03/21/21   Almon Hercules, MD     Critical care time: 44 minutes not including any separately billable procedures

## 2021-04-02 NOTE — Progress Notes (Signed)
Pharmacy Antibiotic Note  Fernando Maddox is a 60 y.o. male form SNF admitted on 04/02/2021 with confusion and undifferentiated shock (septic + cardiogenic?).  Pharmacy has been consulted for vancomycin and cefepime dosing. Cr climbing up to 1.8 crcl 59ml/min  Plan: Zosyn x 1 in ED  Cefepime 2 g iv q 8 hours 6 hours post Zosyn dose - will reassess renal function in am and adjust dose as needed   Vancomycin 2000 mg IV once followed by 1250 IV Q 24 hrs. Goal AUC 400-550.Dose adjusted for worsening renal function today   Will f/u renal function, culture results, and clinical course MRSA PCR ordered   Height: 6\' 3"  (190.5 cm) Weight: 120.3 kg (265 lb 3.4 oz) IBW/kg (Calculated) : 84.5  Temp (24hrs), Avg:97.6 F (36.4 C), Min:97.6 F (36.4 C), Max:97.6 F (36.4 C)  Recent Labs  Lab 04/02/21 0346 04/02/21 0500 04/02/21 0631 04/02/21 0928 04/02/21 0947  WBC  --   --   --  14.5*  --   CREATININE  --  1.55*  --   --  1.81*  LATICACIDVEN >11.0*  --  >11.0*  --  7.7*    Estimated Creatinine Clearance: 61.4 mL/min (A) (by C-G formula based on SCr of 1.81 mg/dL (H)).    Allergies  Allergen Reactions  . Codeine Itching and Other (See Comments)    "Allergic," per paperwork from facility  . Bee Venom Other (See Comments)    "Feels like his head is on fire when he goes outside in the sun" and "Allergic," per paperwork from facility  . Hydrocodone-Acetaminophen Itching and Other (See Comments)    "Allergic," per paperwork from facility  . Cephalexin Other (See Comments)    No mention of this allergy in notes from 07/2016 admission for osteomyelitis.  Patient was treated with Ancef and Unasyn for broader anaerobic coverage.  Patient is not aware of allergy. -08/02/2020  On 04/02/2021: "Allergic," per paperwork from facility  . Vicodin [Hydrocodone-Acetaminophen] Itching and Other (See Comments)    "Allergic," per paperwork from facility  . Zocor [Simvastatin] Other (See Comments)     Myalgias and "Allergic," per paperwork from facility    Antimicrobials this admission: 6/7 Zosyn x 1 6/7 cefepime >>  6/7 vancomycin >>  Dose adjustments this admission:  Microbiology results: 6/7 BCx:  6/7 MRSA PCR:  6/7 COVID:    8/7 Pharm.D. CPP, BCPS Clinical Pharmacist [phone] 04/02/2021 1:25 PM

## 2021-04-02 NOTE — Progress Notes (Signed)
Notified ICU  bedside nurse of need to draw repeat lactic acid #3 (ordered every 3 hours).

## 2021-04-02 NOTE — ED Provider Notes (Signed)
[facility]-EMERGENCY DEPT Provider Note  CSN: [id number] Arrival date & time: 04/02/21 0326  Chief Complaint(s) Hypoglycemia and Altered Mental Status  HPI Fernando Maddox is a 60 y.o. male with extensive past medical history listed below here from skilled nursing facility by EMS for failure to thrive.  EMS reports they were called out due to CBG of 30.  Patient has not been taking much p.o. per the facility. They have given him oral glucose and ensures to try to keep her sugars up. No vomiting.  No diarrhea.  No abdominal pain.  Additionally patient has been pulling off his nasal cannula.  He requires 5 L nasal cannula due to chronic combined systolic/diastolic heart failure.   Patient complains only of chronic leg pain and sacral pain.  Denies any chest pain or abdominal pain.  No other physical complaints.  HPI  Past Medical History Past Medical History:  Diagnosis Date  . Acute combined systolic and diastolic CHF, NYHA class 1 (HCC) 08/07/2016  . Anemia   . Atrial fibrillation (HCC)   . BACK PAIN 02/11/2010   Qualifier: Diagnosis of  By: Manson Passey, RN, BSN, Lauren    . CAD (coronary artery disease) 02/11/2010   Qualifier: Diagnosis of  By: Manson Passey, RN, BSN, Lauren    . Cellulitis 09/29/2017  . Cerebrovascular disease   . CHEST PAIN 02/11/2010   Qualifier: Diagnosis of  By: Manson Passey, RN, BSN, Lauren    . Chronic anticoagulation 03/25/2018  . Chronic pain syndrome   . Chronic systolic congestive heart failure (HCC) 03/25/2018  . Community acquired pneumonia 08/11/2016  . DEPRESSION/ANXIETY 02/11/2010   Qualifier: Diagnosis of  By: Manson Passey, RN, BSN, Lauren    . Diabetes mellitus, type 2 (HCC) 02/11/2010   Qualifier: Diagnosis of  By: Manson Passey, RN, BSN, Lauren    . Diabetic ulcer of toe of right foot associated with type 2 diabetes mellitus (HCC) 10/01/2017  . ESOPHAGEAL STRICTURE 02/11/2010   Qualifier: Diagnosis of  By: Manson Passey, RN, BSN, Lauren    . GERD (gastroesophageal reflux  disease)   . HEADACHE 02/11/2010   Qualifier: Diagnosis of  By: Manson Passey, RN, BSN, Lauren    . History of CVA (cerebrovascular accident) 09/29/2017  . History of drug abuse (HCC) 08/05/2016  . Hypercholesteremia   . Hyperlipemia 02/11/2010   Qualifier: Diagnosis of  By: Manson Passey, RN, BSN, Lauren    . Hyperlipidemia   . Hypertension 02/11/2010   Qualifier: Diagnosis of  By: Manson Passey, RN, BSN, Lauren    . HYPERTENSION, UNSPECIFIED 02/11/2010   Qualifier: Diagnosis of  By: Manson Passey, RN, BSN, Lauren    . Hypogonadism male   . 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 Status post thrombectomy  . Osteoarthritis 08/05/2016  . Osteomyelitis, unspecified (HCC) 10/06/2017  . PAF (paroxysmal atrial fibrillation) (HCC) 08/05/2016  . Polysubstance abuse (HCC) 09/29/2017  . Rotator cuff syndrome    Shoulder  . Septic shock (HCC) 02/14/2021  . Shoulder pain   . 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 Sleep apnea   . Stroke (HCC) 08/05/2016  . Tobacco use 08/11/2016  . Ulcer of left foot with necrosis of bone [facility])    Patient Active Problem List   Diagnosis Date Noted  . Cardiogenic shock (HCC) 04/02/2021  . Shock (HCC) 04/02/2021  . Acute metabolic encephalopathy 03/18/2021  . AKI (acute kidney injury) (HCC) 03/18/2021  . Transaminitis 03/18/2021  .  Dehiscence of amputation stump (HCC)   . Foot ulcer (HCC)   . Acute combined systolic and diastolic congestive heart failure (HCC)   . Hyponatremia   . Ulcer of left foot with necrosis of bone (HCC)   . Septic shock (HCC) 02/14/2021  . Atrial fibrillation (HCC)   . Cerebrovascular disease   . Chronic pain syndrome   . Hyperlipidemia   . Shoulder pain   . Normocytic anemia   . GERD (gastroesophageal reflux disease)   . Hypercholesteremia   . Hypogonadism male   . Rotator cuff syndrome   . Sleep apnea   . Chronic anticoagulation 03/25/2018  .  Chronic systolic congestive heart failure (HCC) 03/25/2018  . Osteomyelitis, unspecified (HCC) 10/06/2017  . Diabetic ulcer of toe of right foot associated with type 2 diabetes mellitus (HCC) 10/01/2017  . Cellulitis 09/29/2017  . History of CVA (cerebrovascular accident) 09/29/2017  . Polysubstance abuse (HCC) 09/29/2017  . 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 Community acquired pneumonia 08/11/2016  . Tobacco use 08/11/2016  . Acute combined systolic and diastolic CHF, NYHA class 1 (HCC) 08/07/2016  . 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 History of drug abuse (HCC) 08/05/2016  . Osteoarthritis 08/05/2016  . PAF (paroxysmal atrial fibrillation) (HCC) 08/05/2016  . Stroke (HCC) 08/05/2016  . Diabetes mellitus, type 2 (HCC) 02/11/2010  . Hyperlipemia 02/11/2010  . HYPERTENSION, UNSPECIFIED 02/11/2010  . CAD (coronary artery disease) 02/11/2010  . Hypertension 02/11/2010  . ESOPHAGEAL STRICTURE 02/11/2010  . BACK PAIN 02/11/2010  . HEADACHE 02/11/2010  . CHEST PAIN 02/11/2010  . DEPRESSION/ANXIETY 02/11/2010   Home Medication(s) Prior to Admission medications   Medication Sig Start Date End Date Taking? Authorizing Provider  acetaminophen (TYLENOL) 325 MG tablet Take 2 tablets (650 mg total) by mouth every 6 (six) hours as needed for mild pain (or Fever >/= 101). 03/08/21   Leroy SeaSingh, Prashant K, MD  albuterol (PROVENTIL) (2.5 MG/3ML) 0.083% nebulizer solution Inhale 3 mLs into the lungs every 4 (four) hours as needed for wheezing or shortness of breath. Patient taking differently: Inhale 2.5 mg into the lungs every 4 (four) hours as needed for wheezing or shortness of breath. 03/08/21   Leroy SeaSingh, Prashant K, MD  DULoxetine (CYMBALTA) 20 MG capsule Take 1 capsule (20 mg total) by mouth daily. 03/22/21   Almon HerculesGonfa, Taye T, MD  empagliflozin (JARDIANCE) 10 MG TABS tablet Take 1 tablet (10 mg total) by mouth daily. 03/22/21   Almon HerculesGonfa, Taye T, MD   folic acid (FOLVITE) 1 MG tablet Take 1 mg by mouth daily.    [provider]  furosemide (LASIX) 20 MG tablet Take 1 tablet (20 mg total) by mouth 2 (two) times daily. 03/20/21   Almon HerculesGonfa, Taye T, MD  gabapentin (NEURONTIN) 100 MG capsule Take 100 mg by mouth 3 (three) times daily.    [provider]  insulin aspart (NOVOLOG) 100 UNIT/ML injection Inject 1-5 Units into the skin 4 (four) times daily -  before meals and at bedtime. CBG 70 - 120: 0 units CBG 121 - 150: 0 units CBG 151 - 200: 0 units CBG 201 - 250: 2 units CBG 251 - 300: 3 units CBG 301 - 350: 4 units CBG 351 - 400: 5 units CBG > 400: call MD and obtain STAT lab verification 03/20/21   Almon HerculesGonfa, Taye T, MD  midodrine (PROAMATINE) 5 MG tablet Take 1 tablet (5 mg total) by mouth 3 (three) times daily with meals.  Patient not taking: Reported on 03/18/2021 03/09/21   Leroy Sea, MD  pantoprazole (PROTONIX) 40 MG tablet Take 1 tablet (40 mg total) by mouth daily. 03/08/21   Leroy Sea, MD  potassium chloride SA (KLOR-CON) 20 MEQ tablet Take 1 tablet (20 mEq total) by mouth 2 (two) times daily. 03/22/21   Almon Hercules, MD  rivaroxaban (XARELTO) 20 MG TABS tablet Take 20 mg by mouth daily with supper. Patient not taking: Reported on 03/18/2021    [provider]  spironolactone (ALDACTONE) 25 MG tablet Take 0.5 tablets (12.5 mg total) by mouth daily. 03/22/21 03/22/22  Almon Hercules, MD  thiamine 100 MG tablet Take 1 tablet (100 mg total) by mouth daily. 03/21/21   Almon Hercules, MD                                                                                                                                    Past Surgical History Past Surgical History:  Procedure Laterality Date  . AMPUTATION Left 02/20/2021   Procedure: LEFT TRANSMETATARSAL AMPUTATION;  Surgeon: [name], MD;  Location: [facility] OR;  Service: Orthopedics;  Laterality: Left;  . AMPUTATION Left 03/06/2021   Procedure: LEFT BELOW KNEE  AMPUTATION;  Surgeon: [name], MD;  Location: [facility] OR;  Service: Orthopedics;  Laterality: Left;  . NO PAST SURGERIES     Family History Family History  Problem Relation Age of Onset  . Hypertension Mother   . Hypertension Father     Social History Social History   Tobacco Use  . Smoking status: Former Smoker    Types: Cigarettes    Start date: 11/06/2016  . Smokeless tobacco: Former Neurosurgeon    Quit date: 03/06/2016   Allergies Codeine, Bee venom, Hydrocodone-acetaminophen, Cephalexin, Vicodin [hydrocodone-acetaminophen], and Zocor [simvastatin]  Review of Systems Review of Systems All other systems are reviewed and are negative for acute change except as noted in the HPI  Physical Exam Vital Signs  I have reviewed the triage vital signs BP 135/80 (BP Location: Right Arm)   Pulse (!) 112   Temp 97.6 F (36.4 C) (Oral)   SpO2 98%   Physical Exam Vitals reviewed.  Constitutional:      General: He is not in acute distress.    Appearance: He is well-developed. He is not diaphoretic.  HENT:     Head: Normocephalic and atraumatic.     Nose: Nose normal.  Eyes:     General: No scleral icterus.       Right eye: No discharge.        Left eye: No discharge.     Conjunctiva/sclera: Conjunctivae normal.     Pupils: Pupils are equal, round, and reactive to light.  Cardiovascular:     Rate and Rhythm: Normal rate and regular rhythm.     Heart sounds: No murmur heard. No friction rub. No gallop.  Pulmonary:     Effort: Pulmonary effort is normal. No respiratory distress.     Breath sounds: Normal breath sounds. No stridor. No rales.  Abdominal:     General: There is no distension.     Palpations: Abdomen is soft.     Tenderness: There is no abdominal tenderness.  Musculoskeletal:        General: No tenderness.     Cervical back: Normal range of motion and neck supple.     Right lower leg: 3+ Pitting Edema (cold to touch) present.       Legs:  Skin:    General:  Skin is warm and dry.     Findings: No erythema or rash.  Neurological:     Mental Status: He is alert and oriented to person, place, and time.     Comments: Left sided deficits (baseline)     ED Results and Treatments Labs (all labs ordered are listed, but only abnormal results are displayed) Labs Reviewed  LACTIC ACID, PLASMA - Abnormal; Notable for the following components:      Result Value   Lactic Acid, Venous >11.0 (*)    All other components within normal limits  LACTIC ACID, PLASMA - Abnormal; Notable for the following components:   Lactic Acid, Venous >11.0 (*)    All other components within normal limits  BRAIN NATRIURETIC PEPTIDE - Abnormal; Notable for the following components:   B Natriuretic Peptide 1,993.9 (*)    All other components within normal limits  COMPREHENSIVE METABOLIC PANEL - Abnormal; Notable for the following components:   Sodium 128 (*)    Chloride 92 (*)    CO2 11 (*)    Glucose, Bld 319 (*)    BUN 33 (*)    Creatinine, Ser 1.55 (*)    Total Protein 6.2 (*)    Albumin 2.6 (*)    AST 549 (*)    ALT 133 (*)    Total Bilirubin 5.4 (*)    GFR, Estimated 51 (*)    Anion gap 25 (*)    All other components within normal limits  BLOOD GAS, VENOUS - Abnormal; Notable for the following components:   pH, Ven 7.219 (*)    pCO2, Ven 29.5 (*)    pO2, Ven 52.8 (*)    Bicarbonate 11.6 (*)    Acid-base deficit 14.7 (*)    All other components within normal limits  CBG MONITORING, ED - Abnormal; Notable for the following components:   Glucose-Capillary 133 (*)    All other components within normal limits  SARS CORONAVIRUS 2 (TAT 6-24 HRS)  CULTURE, BLOOD (ROUTINE X 2)  CULTURE, BLOOD (ROUTINE X 2)  MRSA PCR SCREENING  URINALYSIS, ROUTINE W REFLEX MICROSCOPIC  AMMONIA  BASIC METABOLIC PANEL  CBC  LACTIC ACID, PLASMA  LACTIC ACID, PLASMA  BASIC METABOLIC PANEL  CBG MONITORING, ED  CBG MONITORING, ED  EKG  EKG Interpretation  Date/Time:  Tuesday April 02 2021 04:11:57 EDT Ventricular Rate:  123 PR Interval:    QRS Duration: 114 QT Interval:  300 QTC Calculation: 429 R Axis:   236 Text Interpretation: Atrial fibrillation with rapid ventricular response Low voltage QRS Possible Anterolateral infarct , age undetermined Abnormal ECG Confirmed by Drema Pry [id number]) on 04/02/2021 4:41:55 AM      Radiology CT Head Wo Contrast  Result Date: 04/02/2021 CLINICAL DATA:  Mental status change with unknown cause EXAM: CT HEAD WITHOUT CONTRAST TECHNIQUE: Contiguous axial images were obtained from the base of the skull through the vertex without intravenous contrast. COMPARISON:  03/15/2021 FINDINGS: Brain: Large remote right MCA territory infarct with dense encephalomalacia throughout the territory. No evidence of acute infarct, hemorrhage, hydrocephalus, or mass. Vascular: No hyperdense vessel or unexpected calcification. Skull: Normal. Negative for fracture or focal lesion. Sinuses/Orbits: Negative Other: Moderately motion degraded. IMPRESSION: 1. Motion degraded study without acute finding. 2. Remote MCA territory infarct. Electronically Signed   By: Marnee Spring M.D.   On: 04/02/2021 07:23   CT CHEST WO CONTRAST  Result Date: 04/02/2021 CLINICAL DATA:  Aspiration.  Abnormal chest x-ray EXAM: CT CHEST WITHOUT CONTRAST TECHNIQUE: Multidetector CT imaging of the chest was performed following the standard protocol without IV contrast. COMPARISON:  02/03/2021 FINDINGS: Cardiovascular: Cardiomegaly. No pericardial effusion. Extensive atheromatous calcification of the coronaries. No acute vascular finding without contrast Mediastinum/Nodes: No adenopathy or air leak Lungs/Pleura: Multifocal airspace disease affecting all lobes and most confluent at the right upper lobe. Small left and more moderate right layering pleural fluid that  appears low-density. No septal thickening. No air leak Upper Abdomen: Negative Musculoskeletal: Spondylosis. Remote bilateral rib fractures. No acute finding. Other: Motion artifact. IMPRESSION: 1. Multifocal pneumonia. 2. Layering pleural effusions, moderate on the right and small on the left. 3. Cardiomegaly and extensive coronary atherosclerosis. Electronically Signed   By: Marnee Spring M.D.   On: 04/02/2021 07:25   DG Chest Port 1 View  Result Date: 04/02/2021 CLINICAL DATA:  Edema. EXAM: PORTABLE CHEST 1 VIEW COMPARISON:  03/16/2021. FINDINGS: Interval removal of left IJ line. Cardiomegaly. Bilateral multifocal infiltrates/edema, right side greater than left. Small bilateral pleural effusions cannot be excluded. No pneumothorax. Old left rib fractures noted. IMPRESSION: 1.  Interval removal of left IJ line.  No pneumothorax. 2.  Cardiomegaly. 3. Bilateral multifocal infiltrates/edema, right side greater than left. CHF and/or multifocal pneumonia could present this fashion. Small bilateral pleural effusions. Electronically Signed   By: Maisie Fus  Register   On: 04/02/2021 05:18    Pertinent labs & imaging results that were available during my care of the patient were reviewed by me and considered in my medical decision making (see chart for details).  Medications Ordered in ED Medications  sodium chloride flush (NS) 0.9 % injection 3 mL (has no administration in time range)  sodium chloride flush (NS) 0.9 % injection 3 mL (has no administration in time range)  0.9 %  sodium chloride infusion (has no administration in time range)  vancomycin (VANCOREADY) IVPB 2000 mg/400 mL (2,000 mg Intravenous New Bag/Given 04/02/21 0734)  sodium bicarbonate 150 mEq in dextrose 5 % 1,150 mL infusion ( Intravenous New Bag/Given 04/02/21 0709)  docusate sodium (COLACE) capsule 100 mg (has no administration in time range)  polyethylene glycol (MIRALAX / GLYCOLAX) packet 17 g (has no administration in time range)   vancomycin (VANCOREADY) IVPB 1750 mg/350 mL (has no administration in time range)  ceFEPIme (MAXIPIME) 2  g in sodium chloride 0.9 % 100 mL IVPB (has no administration in time range)  dextrose 10 % infusion (100 mLs Intravenous New Bag/Given 04/02/21 0408)  ondansetron (ZOFRAN) injection 4 mg (4 mg Intravenous Given 04/02/21 0422)  piperacillin-tazobactam (ZOSYN) IVPB 3.375 g (0 g Intravenous Stopped 04/02/21 0710)                                                                                                                                    Procedures .1-3 Lead EKG Interpretation Performed by: Nira Conn, MD Authorized by: Nira Conn, MD     Interpretation: abnormal     ECG rate:  105   Rhythm: atrial fibrillation     Ectopy: none     Conduction: normal   .Critical Care Performed by: Nira Conn, MD Authorized by: Nira Conn, MD   Critical care provider statement:    Critical care time (minutes):  45   Critical care was necessary to treat or prevent imminent or life-threatening deterioration of the following conditions:  Shock   Critical care was time spent personally by me on the following activities:  Discussions with consultants, evaluation of patient's response to treatment, examination of patient, ordering and performing treatments and interventions, ordering and review of laboratory studies, ordering and review of radiographic studies, pulse oximetry, re-evaluation of patient's condition, obtaining history from patient or surrogate and review of old charts   Care discussed with: admitting provider      (including critical care time)  Medical Decision Making / ED Course I have reviewed the nursing notes for this encounter and the patient's prior records (if available in EHR or on provided paperwork).   Roxy Filler Evinger was evaluated in Emergency Department on 04/02/2021 for the symptoms described in the history of present illness. He was  evaluated in the context of the global COVID-19 pandemic, which necessitated consideration that the patient might be at risk for infection with the SARS-CoV-2 virus that causes COVID-19. Institutional protocols and algorithms that pertain to the evaluation of patients at risk for COVID-19 are in a state of rapid change based on information released by regulatory bodies including the CDC and federal and state organizations. These policies and algorithms were followed during the patient's care in the ED.  Patient here for failure to thrive and hypoglycemia. Patient is afebrile with stable vital signs. Initial CBG of 90. Patient placed on D10. Rest of labs notable for lactic of greater than 11. Exam concerning for possible aspiration pneumonia.  Patient with recent admission to the hospital for cardiogenic versus septic shock. Feel clinical picture is the same tonight.  Code sepsis initiated patient started on empiric antibiotics for aspiration pneumonia. BNP close to 2000, increased from prior. Will need to be careful with IV hydration given possible cardiogenic shock Spoke with critical care who admitted the patient for continued work-up and management.  Final Clinical Impression(s) / ED Diagnoses Final diagnoses:  Shock (HCC)      This chart was dictated using voice recognition software.  Despite best efforts to proofread,  errors can occur which can change the documentation meaning.   Nira Conn, MD 04/02/21 [phone]

## 2021-04-02 NOTE — Progress Notes (Signed)
ANTICOAGULATION CONSULT NOTE - Initial Consult  Pharmacy Consult for: Heparin Indication: atrial fibrillation  Allergies  Allergen Reactions  . Codeine Itching and Other (See Comments)    "Allergic," per paperwork from facility  . Bee Venom Other (See Comments)    "Feels like his head is on fire when he goes outside in the sun" and "Allergic," per paperwork from facility  . Hydrocodone-Acetaminophen Itching and Other (See Comments)    "Allergic," per paperwork from facility  . Cephalexin Other (See Comments)    No mention of this allergy in notes from 07/2016 admission for osteomyelitis.  Patient was treated with Ancef and Unasyn for broader anaerobic coverage.  Patient is not aware of allergy. -08/02/2020  On 04/02/2021: "Allergic," per paperwork from facility  . Vicodin [Hydrocodone-Acetaminophen] Itching and Other (See Comments)    "Allergic," per paperwork from facility  . Zocor [Simvastatin] Other (See Comments)    Myalgias and "Allergic," per paperwork from facility    Patient Measurements: Height: 6\' 3"  (190.5 cm) Weight: 120.3 kg (265 lb 3.4 oz) IBW/kg (Calculated) : 84.5 Heparin Dosing Weight: 110 kg  Vital Signs: Temp: 97.6 F (36.4 C) (06/07 0344) Temp Source: Oral (06/07 0344) BP: 116/82 (06/07 1200) Pulse Rate: 93 (06/07 1200)  Labs: Recent Labs    04/02/21 0500 04/02/21 0928 04/02/21 0947  HGB  --  9.7*  --   HCT  --  32.7*  --   PLT  --  289  --   CREATININE 1.55*  --  1.81*    Estimated Creatinine Clearance: 61.4 mL/min (A) (by C-G formula based on SCr of 1.81 mg/dL (H)).  Assessment: 60 yo WM w/ hx of CAD, chronic Afib (on PTA Xarelto), systolic HF - presenting with failure to thrive, confusion and anorexia.   Goal of Therapy:  aPTT 66-102 seconds Monitor platelets by anticoagulation protocol: Yes   Plan:  Start heparin infusion at 1500 units/hr -will monitor daily HL, aPT, CBC and s/sx of bleeding  -f/u on post heparin gtt initiation 6h aPTT  at 2000 tonight  46, PharmD, BCCCP Emergency Medicine Clinical Pharmacist  Please check AMION for all [facility] phone numbers After 10:00 PM, call Main Pharmacy [phone]

## 2021-04-02 NOTE — Progress Notes (Signed)
Notified provider of need to draw repeat lactic acid (#3).

## 2021-04-02 NOTE — ED Notes (Signed)
[facility] transport requested.

## 2021-04-02 NOTE — Progress Notes (Addendum)
Patient arrived from [facility] with [facility]. Not hypotensive- SBP ~90. CVC in place but not verified on CXR yet.  BP 100/76   Pulse (!) 106   Temp 97.6 F (36.4 C) (Oral)   Resp 16   SpO2 100%  Chronically ill appearing man lying in bed in NAD [place]/AT, eyes anicteric Edematous to shoulders Awake, alert, answering questions appropriately, globally weak   Plan: Cardiology consult STAT CXR to verify line Coox, CVP monitoring Starting milrinone lasix Palliative Care consult Repeat lactate and BMP, repeat coox after a few hours on milrinone  GOC discussion-- when we discussed code status he indicated that he would like to be DNR. RNs present during discussion. Code status updated in Epic.  Steffanie Dunn, DO 04/02/21 9:49 AM [facility]   CXR with RUL infiltrate. Adding azithromycin.  Steffanie Dunn, DO 04/02/21 9:57 AM [facility]

## 2021-04-02 NOTE — ED Triage Notes (Signed)
EMS vitals BP 106/58 HR 90 RR 24 O2 93% (5L) CBG 130 (repeated after D10)

## 2021-04-02 NOTE — Consult Note (Signed)
Cardiology Consultation:   Patient ID: Fernando Maddox MRN: [id number]; DOB: 1961-05-10  Admit date: 04/02/2021 Date of Consult: 04/02/2021  PCP:  Sherron Monday, MD   [facility] Providers Cardiologist:  Garwin Brothers, MD        Patient Profile:   Fernando Maddox is a 60 y.o. male with a hx of CAD, chronic atrial fibrillation, history of CVA, OSA on CPAP, HTN, HLD, DM2, tobacco abuse, combined systolic and diastolic heart failure, recent left BKA secondary to osteomyelitis, and history of polysubstance abuse who is being seen 04/02/2021 for the evaluation of cardiogenic vs septic shock at the request of Dr. Everardo All.  History of Present Illness:   Fernando Maddox is a 60 year old male with past medical history of CAD, chronic atrial fibrillation, history of CVA, OSA on CPAP, HTN, HLD, DM2, tobacco abuse, combined systolic and diastolic heart failure, recent left BKA secondary to osteomyelitis, and history of polysubstance abuse.  Per previous report from [facility] system, patient did underwent angioplasty with stent in the remote past.  However record is quite scant on what is the stent was placed.  He is a very poor historian with questionable compliance with medication.  Echocardiogram in January 2018 showed four-chamber dilatation, EF 30 to 35%, mild MR, diffuse LV hypokinesis, borderline and mild elevation of the PA pressure.  Previous Myoview in January 2017 demonstrated EF 26%, no significant perfusion defect.  Repeat echocardiogram in June 2019 showed moderate MR, moderate-severe TR, EF 30 to 35%.  Patient was last seen by Dr. Tomie China in [place] office in October 2021, he was a very poor historian during the interview and was only able to communicate in single words or 2 words at best.  Heart rate was not controlled at the time, there was significant concern that the patient may not be compliant.  Metoprolol was increased from 25 mg daily to 50 mg a.m. and 25 mg p.m.  He was instructed to follow-up  in 1 month, however has been lost to follow-up since.    Since April 2022, patient has been admitted to the hospital multiple times.  He was admitted in April 2022 with altered mental status after being found down on the floor for 2 days.  He was also noted to have gangrene 1 g right lower extremity with erythema and edema in the left lower extremity.  He was subsequently transferred from [facility] to [facility]. During the hospitalization, cardiology service was consulted for CHF and atrial fibrillation.  Echocardiogram obtained on 02/15/2021 showed EF 20 to 25%, severely decreased left ventricular RV systolic function, flattening of the interventricular septum consistent with right ventricular pressure and volume overload, moderately dilated left and right atrium, borderline dilated aortic root measuring at 40 mm.  He required brief pressor support due to septic shock.  Amiodarone was used to help with rate control due to hypotension.  Hospitalization was complicated by leukocytosis, AKI and septic arthritis in his foot. He underwent left transmetatarsal amputation on 02/20/2021 by Dr. Lajoyce Corners.  He eventually underwent left BKA by Dr. Lajoyce Corners on 03/06/2021.  Patient was eventually discharged to skilled nursing facility however returned 190 2022 with either cardiogenic versus septic shock.  There was no clear source of infection.  He was treated with IV pressure with dobutamine and also p.o. midodrine.  Lab work shows lactic acidosis with acute metabolic encephalopathy.  He did have AKI however resolved prior to discharge on 5/27.  Patient was sent to ED from the  skilled nursing facility today for failure to thrive, increased confusion, anorexia.  While at the ED, it was also noted to the patient had hypoglycemia and severe lactic acidosis with lactic acid greater than 11.  Chest x-ray concerning for right lower lobe pneumonia.  Patient is at significant risk for aspiration pneumonia.  Creatinine jumped  from the previous 0.99 up to 1.55.  Potassium elevated to 5.5.  White blood cell count 14.5.  Hemoglobin 9.7.  Repeat limited echocardiogram shows EF 20 to 25%, interventricular septum flattening in systole and diastole consistent with right ventricular pressure and volume overload, severe global hypokinesis with relative preservation of the basal and mid segment of the lateral wall, RVSP 46.8 mmHg, moderate dilatation of the left and right atrium, moderate to severe TR, moderate to severe MR.  Blood cultures currently pending.  Patient has been placed on Lasix 60 mg every 6 hours due to anasarca.  Azithromycin, cefepime and milrinone were started.  Systolic blood pressure ranging between 100 130s.  Heart rate upper 90s to 110 range while in atrial fibrillation.   Past Medical History:  Diagnosis Date  . Acute combined systolic and diastolic CHF, NYHA class 1 (HCC) 08/07/2016  . Anemia   . Atrial fibrillation (HCC)   . BACK PAIN 02/11/2010   Qualifier: Diagnosis of  By: Manson Passey, RN, BSN, Lauren    . CAD (coronary artery disease) 02/11/2010   Qualifier: Diagnosis of  By: Manson Passey, RN, BSN, Lauren    . Cellulitis 09/29/2017  . Cerebrovascular disease   . CHEST PAIN 02/11/2010   Qualifier: Diagnosis of  By: Manson Passey, RN, BSN, Lauren    . Chronic anticoagulation 03/25/2018  . Chronic pain syndrome   . Chronic systolic congestive heart failure (HCC) 03/25/2018  . Community acquired pneumonia 08/11/2016  . DEPRESSION/ANXIETY 02/11/2010   Qualifier: Diagnosis of  By: Manson Passey, RN, BSN, Lauren    . Diabetes mellitus, type 2 (HCC) 02/11/2010   Qualifier: Diagnosis of  By: Manson Passey, RN, BSN, Lauren    . Diabetic ulcer of toe of right foot associated with type 2 diabetes mellitus (HCC) 10/01/2017  . ESOPHAGEAL STRICTURE 02/11/2010   Qualifier: Diagnosis of  By: Manson Passey, RN, BSN, Lauren    . GERD (gastroesophageal reflux disease)   . HEADACHE 02/11/2010   Qualifier: Diagnosis of  By: Manson Passey, RN, BSN, Lauren    . History of  CVA (cerebrovascular accident) 09/29/2017  . History of drug abuse (HCC) 08/05/2016  . Hypercholesteremia   . Hyperlipemia 02/11/2010   Qualifier: Diagnosis of  By: Manson Passey, RN, BSN, Lauren    . Hyperlipidemia   . Hypertension 02/11/2010   Qualifier: Diagnosis of  By: Manson Passey, RN, BSN, Lauren    . HYPERTENSION, UNSPECIFIED 02/11/2010   Qualifier: Diagnosis of  By: Manson Passey, RN, BSN, Lauren    . Hypogonadism male   . 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 Status post thrombectomy  . Osteoarthritis 08/05/2016  . Osteomyelitis, unspecified (HCC) 10/06/2017  . PAF (paroxysmal atrial fibrillation) (HCC) 08/05/2016  . Polysubstance abuse (HCC) 09/29/2017  . Rotator cuff syndrome    Shoulder  . Septic shock (HCC) 02/14/2021  . Shoulder pain   . 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 Sleep apnea   . Stroke (HCC) 08/05/2016  . Tobacco use 08/11/2016  . Ulcer of left foot with necrosis of bone [facility])     Past Surgical History:  Procedure Laterality Date  .  AMPUTATION Left 02/20/2021   Procedure: LEFT TRANSMETATARSAL AMPUTATION;  Surgeon: [name], MD;  Location: [facility] OR;  Service: Orthopedics;  Laterality: Left;  . AMPUTATION Left 03/06/2021   Procedure: LEFT BELOW KNEE AMPUTATION;  Surgeon: [name], MD;  Location: [facility] OR;  Service: Orthopedics;  Laterality: Left;  . NO PAST SURGERIES       Home Medications:  Prior to Admission medications   Medication Sig Start Date End Date Taking? Authorizing Provider  acetaminophen (TYLENOL) 325 MG tablet Take 2 tablets (650 mg total) by mouth every 6 (six) hours as needed for mild pain (or Fever >/= 101). 03/08/21  Yes Leroy Sea, MD  albuterol (PROVENTIL) (2.5 MG/3ML) 0.083% nebulizer solution Inhale 3 mLs into the lungs every 4 (four) hours as needed for wheezing or shortness of breath. 03/08/21  Yes Leroy Sea, MD  DULoxetine  (CYMBALTA) 60 MG capsule Take 60 mg by mouth daily.   Yes [provider]  empagliflozin (JARDIANCE) 10 MG TABS tablet Take 1 tablet (10 mg total) by mouth daily. 03/22/21  Yes Almon Hercules, MD  folic acid (FOLVITE) 1 MG tablet Take 1 mg by mouth daily.   Yes [provider]  furosemide (LASIX) 20 MG tablet Take 1 tablet (20 mg total) by mouth 2 (two) times daily. 03/20/21  Yes Almon Hercules, MD  gabapentin (NEURONTIN) 100 MG capsule Take 100 mg by mouth 3 (three) times daily.   Yes [provider]  insulin aspart (NOVOLOG) 100 UNIT/ML injection Inject 1-5 Units into the skin 4 (four) times daily -  before meals and at bedtime. CBG 70 - 120: 0 units CBG 121 - 150: 0 units CBG 151 - 200: 0 units CBG 201 - 250: 2 units CBG 251 - 300: 3 units CBG 301 - 350: 4 units CBG 351 - 400: 5 units CBG > 400: call MD and obtain STAT lab verification 03/20/21  Yes Gonfa, Boyce Medici, MD  midodrine (PROAMATINE) 5 MG tablet Take 1 tablet (5 mg total) by mouth 3 (three) times daily with meals. 03/09/21  Yes Leroy Sea, MD  pantoprazole (PROTONIX) 40 MG tablet Take 1 tablet (40 mg total) by mouth daily. 03/08/21  Yes Leroy Sea, MD  potassium chloride SA (KLOR-CON) 20 MEQ tablet Take 1 tablet (20 mEq total) by mouth 2 (two) times daily. 03/22/21  Yes Almon Hercules, MD  rivaroxaban (XARELTO) 20 MG TABS tablet Take 20 mg by mouth daily with supper.   Yes [provider]  spironolactone (ALDACTONE) 25 MG tablet Take 0.5 tablets (12.5 mg total) by mouth daily. 03/22/21 03/22/22 Yes Almon Hercules, MD  thiamine 100 MG tablet Take 1 tablet (100 mg total) by mouth daily. 03/21/21  Yes Almon Hercules, MD    Inpatient Medications: Scheduled Meds: . Chlorhexidine Gluconate Cloth  6 each Topical Daily  . furosemide  60 mg Intravenous Q6H  . mouth rinse  15 mL Mouth Rinse BID  . sodium chloride flush  10-40 mL Intracatheter Q12H  . sodium chloride flush  3 mL Intravenous Q12H    Continuous Infusions: . sodium chloride    . azithromycin    . ceFEPime (MAXIPIME) IV    . milrinone 0.25 mcg/kg/min (04/02/21 1042)  . sodium bicarbonate 150 mEq in D5W infusion 125 mL/hr at 04/02/21 0709  . [START ON 04/03/2021] vancomycin     PRN Meds: sodium chloride, docusate sodium, polyethylene glycol, sodium chloride flush, sodium chloride flush  Allergies:    Allergies  Allergen Reactions  . Codeine Itching and Other (See Comments)    "Allergic," per paperwork from facility  . Bee Venom Other (See Comments)    "Feels like his head is on fire when he goes outside in the sun" and "Allergic," per paperwork from facility  . Hydrocodone-Acetaminophen Itching and Other (See Comments)    "Allergic," per paperwork from facility  . Cephalexin Other (See Comments)    No mention of this allergy in notes from 07/2016 admission for osteomyelitis.  Patient was treated with Ancef and Unasyn for broader anaerobic coverage.  Patient is not aware of allergy. -08/02/2020  On 04/02/2021: "Allergic," per paperwork from facility  . Vicodin [Hydrocodone-Acetaminophen] Itching and Other (See Comments)    "Allergic," per paperwork from facility  . Zocor [Simvastatin] Other (See Comments)    Myalgias and "Allergic," per paperwork from facility    Social History:   Social History   Socioeconomic History  . Marital status: Married    Spouse name: Not on file  . Number of children: Not on file  . Years of education: Not on file  . Highest education level: Not on file  Occupational History  . Not on file  Tobacco Use  . Smoking status: Former Smoker    Types: Cigarettes    Start date: 11/06/2016  . Smokeless tobacco: Former Neurosurgeon    Quit date: 03/06/2016  Substance and Sexual Activity  . Alcohol use: Not on file  . Drug use: Not on file  . Sexual activity: Not on file  Other Topics Concern  . Not on file  Social History Narrative  . Not on file   Social Determinants of Health    Financial Resource Strain: Not on file  Food Insecurity: Not on file  Transportation Needs: Not on file  Physical Activity: Not on file  Stress: Not on file  Social Connections: Not on file  Intimate Partner Violence: Not on file    Family History:    Family History  Problem Relation Age of Onset  . Hypertension Mother   . Hypertension Father      ROS:  Please see the history of present illness.   All other ROS reviewed and negative.     Physical Exam/Data:   Vitals:   04/02/21 0409 04/02/21 0437 04/02/21 0630 04/02/21 0830  BP: 110/82 118/84 103/87 100/76  Pulse: (!) 116 (!) 117 (!) 104 (!) 106  Resp: (!) 23 (!) 24 14 16   Temp:      TempSrc:      SpO2: 100% 97% 99% 100%    Intake/Output Summary (Last 24 hours) at 04/02/2021 1052 Last data filed at 04/02/2021 0957 Gross per 24 hour  Intake --  Output 225 ml  Net -225 ml   Last 3 Weights 03/19/2021 03/18/2021 03/16/2021  Weight (lbs) 265 lb 3.4 oz 236 lb 5.5 oz 236 lb 5.3 oz  Weight (kg) 120.3 kg 107.205 kg 107.2 kg     There is no height or weight on file to calculate BMI.  General:  Did not wake up for me during interview.  HEENT: normal. Lymph: no adenopathy Neck: R IJ in place Endocrine:  No thryomegaly Vascular: No carotid bruits; FA pulses 2+ bilaterally without bruits  Cardiac:  normal S1, S2; irregularly irregular; no murmur  Lungs:  clear to auscultation bilaterally, no wheezing, rhonchi or rales  Abd: soft, nontender, no hepatomegaly  Ext: diffuse edema Musculoskeletal:  No deformities, BUE and BLE  strength normal and equal Skin: cool to touch  Neuro:  CNs 2-12 intact, no focal abnormalities noted Psych:  Normal affect   EKG:  The EKG was personally reviewed and demonstrates:  Atrial fibrillation with poor R wave progression Telemetry:  Telemetry was personally reviewed and demonstrates:  Atrial fibrillation with HR 90-110s  Relevant CV Studies:  Limited echo 04/02/2021 1. Left ventricular  ejection fraction, by estimation, is 20 to 25%. The  left ventricle has severely decreased function. The left ventricle  demonstrates regional wall motion abnormalities (see scoring  diagram/findings for description). The left  ventricular internal cavity size was mildly to moderately dilated. Left  ventricular diastolic function could not be evaluated. There is the  interventricular septum is flattened in systole and diastole, consistent  with right ventricular pressure and  volume overload. There is severe global hypokinesis with relative  preservation of the basal and mid segments of the lateral wall.  2. Right ventricular systolic function is severely reduced. The right  ventricular size is severely enlarged. There is moderately elevated  pulmonary artery systolic pressure. The estimated right ventricular  systolic pressure is 46.8 mmHg.  3. Left atrial size was moderately dilated.  4. Right atrial size was moderately dilated.  5. The pericardial effusion is circumferential.  6. The mitral valve is normal in structure. Moderate to severe mitral  valve regurgitation. No evidence of mitral stenosis.  7. Tricuspid valve regurgitation is moderate to severe.  8. The aortic valve is tricuspid. There is mild calcification of the  aortic valve. Aortic valve regurgitation is not visualized. Mild aortic  valve sclerosis is present, with no evidence of aortic valve stenosis.  9. The inferior vena cava is dilated in size with <50% respiratory  variability, suggesting right atrial pressure of 15 mmHg.   Comparison(s): Prior images reviewed side by side. Mitral insufficiency  was underestimated on the previous study, when it was also probably  moderate-severe. Tricuspid insufficiency has worsened on the current  study.   Laboratory Data:  High Sensitivity Troponin:   Recent Labs  Lab 03/15/21 0557 03/15/21 0730  TROPONINIHS 27* 25*     Chemistry Recent Labs  Lab 04/02/21 0500   NA 128*  K SPECIMEN HEMOLYZED. HEMOLYSIS MAY AFFECT INTEGRITY OF RESULTS.  CL 92*  CO2 11*  GLUCOSE 319*  BUN 33*  CREATININE 1.55*  CALCIUM 8.9  GFRNONAA 51*  ANIONGAP 25*    Recent Labs  Lab 04/02/21 0500  PROT 6.2*  ALBUMIN 2.6*  AST 549*  ALT 133*  ALKPHOS 92  BILITOT 5.4*   Hematology Recent Labs  Lab 04/02/21 0928  WBC 14.5*  RBC 3.64*  HGB 9.7*  HCT 32.7*  MCV 89.8  MCH 26.6  MCHC 29.7*  RDW 19.4*  PLT 289   BNP Recent Labs  Lab 04/02/21 0346  BNP 1,993.9*    DDimer No results for input(s): DDIMER in the last 168 hours.   Radiology/Studies:  CT Head Wo Contrast  Result Date: 04/02/2021 CLINICAL DATA:  Mental status change with unknown cause EXAM: CT HEAD WITHOUT CONTRAST TECHNIQUE: Contiguous axial images were obtained from the base of the skull through the vertex without intravenous contrast. COMPARISON:  03/15/2021 FINDINGS: Brain: Large remote right MCA territory infarct with dense encephalomalacia throughout the territory. No evidence of acute infarct, hemorrhage, hydrocephalus, or mass. Vascular: No hyperdense vessel or unexpected calcification. Skull: Normal. Negative for fracture or focal lesion. Sinuses/Orbits: Negative Other: Moderately motion degraded. IMPRESSION: 1. Motion degraded study without acute finding.  2. Remote MCA territory infarct. Electronically Signed   By: Marnee SpringJonathon  Watts M.D.   On: 04/02/2021 07:23   CT CHEST WO CONTRAST  Result Date: 04/02/2021 CLINICAL DATA:  Aspiration.  Abnormal chest x-ray EXAM: CT CHEST WITHOUT CONTRAST TECHNIQUE: Multidetector CT imaging of the chest was performed following the standard protocol without IV contrast. COMPARISON:  02/03/2021 FINDINGS: Cardiovascular: Cardiomegaly. No pericardial effusion. Extensive atheromatous calcification of the coronaries. No acute vascular finding without contrast Mediastinum/Nodes: No adenopathy or air leak Lungs/Pleura: Multifocal airspace disease affecting all lobes and  most confluent at the right upper lobe. Small left and more moderate right layering pleural fluid that appears low-density. No septal thickening. No air leak Upper Abdomen: Negative Musculoskeletal: Spondylosis. Remote bilateral rib fractures. No acute finding. Other: Motion artifact. IMPRESSION: 1. Multifocal pneumonia. 2. Layering pleural effusions, moderate on the right and small on the left. 3. Cardiomegaly and extensive coronary atherosclerosis. Electronically Signed   By: Marnee SpringJonathon  Watts M.D.   On: 04/02/2021 07:25   DG CHEST PORT 1 VIEW  Result Date: 04/02/2021 CLINICAL DATA:  Central catheter placement EXAM: PORTABLE CHEST 1 VIEW COMPARISON:  April 02, 2021 chest radiograph obtained earlier in the day as well as April 02, 2021 chest CT. FINDINGS: Central catheter tip is in the superior vena cava. No pneumothorax. Airspace opacity in the right upper lobe consistent with pneumonia persists. There is ill-defined opacity in portions of each lung base, also likely foci of pneumonia. There is cardiac enlargement with pulmonary vascularity normal. No adenopathy. No bone lesions. IMPRESSION: Central catheter tip in superior vena cava without pneumothorax. Extensive airspace opacity consistent with pneumonia right lobe with ill-defined areas of bibasilar opacity, likely foci of pneumonia as well. Stable cardiomegaly. Electronically Signed   By: Bretta BangWilliam  Woodruff III M.D.   On: 04/02/2021 10:21   DG Chest Port 1 View  Result Date: 04/02/2021 CLINICAL DATA:  Edema. EXAM: PORTABLE CHEST 1 VIEW COMPARISON:  03/16/2021. FINDINGS: Interval removal of left IJ line. Cardiomegaly. Bilateral multifocal infiltrates/edema, right side greater than left. Small bilateral pleural effusions cannot be excluded. No pneumothorax. Old left rib fractures noted. IMPRESSION: 1.  Interval removal of left IJ line.  No pneumothorax. 2.  Cardiomegaly. 3. Bilateral multifocal infiltrates/edema, right side greater than left. CHF and/or  multifocal pneumonia could present this fashion. Small bilateral pleural effusions. Electronically Signed   By: Maisie Fushomas  Register   On: 04/02/2021 05:18   ECHOCARDIOGRAM LIMITED  Result Date: 04/02/2021    ECHOCARDIOGRAM LIMITED REPORT   Patient Name:   Fernando Maddox Date of Exam: 04/02/2021 Medical Rec #:  [id number]     Height:       75.0 in Accession #:    [id number]    Weight:       265.2 lb Date of Birth:  10/15/1961     BSA:          2.474 m Patient Age:    59 years      BP:           103/87 mmHg Patient Gender: M             HR:           103 bpm. Exam Location:  Inpatient Procedure: Limited Echo, Limited Color Doppler and Cardiac Doppler                     STAT ECHO Reported to: Dr. Royann Shiversroitoru on 04/02/2021 8:38:00 AM. Indications:    Cardiomyopathy-Unspecified I42.9  History:        Patient has prior history of Echocardiogram examinations, most                 recent 02/15/2021. CHF, CAD, Stroke, Arrythmias:Atrial                 Fibrillation; Risk Factors:Current Smoker, Hypertension,                 Dyslipidemia and Diabetes.  Sonographer:    Eulah Pont RDCS Referring Phys: [id number] Lorin Glass IMPRESSIONS  1. Left ventricular ejection fraction, by estimation, is 20 to 25%. The left ventricle has severely decreased function. The left ventricle demonstrates regional wall motion abnormalities (see scoring diagram/findings for description). The left ventricular internal cavity size was mildly to moderately dilated. Left ventricular diastolic function could not be evaluated. There is the interventricular septum is flattened in systole and diastole, consistent with right ventricular pressure and volume overload. There is severe global hypokinesis with relative preservation of the basal and mid segments of the lateral wall.  2. Right ventricular systolic function is severely reduced. The right ventricular size is severely enlarged. There is moderately elevated pulmonary artery systolic pressure. The estimated  right ventricular systolic pressure is 46.8 mmHg.  3. Left atrial size was moderately dilated.  4. Right atrial size was moderately dilated.  5. The pericardial effusion is circumferential.  6. The mitral valve is normal in structure. Moderate to severe mitral valve regurgitation. No evidence of mitral stenosis.  7. Tricuspid valve regurgitation is moderate to severe.  8. The aortic valve is tricuspid. There is mild calcification of the aortic valve. Aortic valve regurgitation is not visualized. Mild aortic valve sclerosis is present, with no evidence of aortic valve stenosis.  9. The inferior vena cava is dilated in size with <50% respiratory variability, suggesting right atrial pressure of 15 mmHg. Comparison(s): Prior images reviewed side by side. Mitral insufficiency was underestimated on the previous study, when it was also probably moderate-severe. Tricuspid insufficiency has worsened on the current study. FINDINGS  Left Ventricle: Left ventricular ejection fraction, by estimation, is 20 to 25%. The left ventricle has severely decreased function. The left ventricle demonstrates regional wall motion abnormalities. The left ventricular internal cavity size was mildly  to moderately dilated. There is no left ventricular hypertrophy. The interventricular septum is flattened in systole and diastole, consistent with right ventricular pressure and volume overload. Left ventricular diastolic function could not be evaluated. Left ventricular diastolic function could not be evaluated due to atrial fibrillation.  LV Wall Scoring: There is severe global hypokinesis with relative preservation of the basal and mid segments of the lateral wall. Right Ventricle: The right ventricular size is severely enlarged. Right ventricular systolic function is severely reduced. There is moderately elevated pulmonary artery systolic pressure. The tricuspid regurgitant velocity is 2.82 m/s, and with an assumed right atrial pressure of 15  mmHg, the estimated right ventricular systolic pressure is 46.8 mmHg. Left Atrium: Left atrial size was moderately dilated. Right Atrium: Right atrial size was moderately dilated. Pericardium: Trivial pericardial effusion is present. The pericardial effusion is circumferential. Mitral Valve: The mitral valve is normal in structure. Moderate to severe mitral valve regurgitation, with eccentric posteriorly directed jet. No evidence of mitral valve stenosis. Tricuspid Valve: The tricuspid valve is normal in structure. Tricuspid valve regurgitation is moderate to severe. The flow in the hepatic veins is reversed during ventricular systole. Aortic Valve: The aortic valve is tricuspid. There is mild calcification of  the aortic valve. Aortic valve regurgitation is not visualized. Mild aortic valve sclerosis is present, with no evidence of aortic valve stenosis. Pulmonic Valve: The pulmonic valve was normal in structure. Pulmonic valve regurgitation is not visualized. Aorta: The aortic root is normal in size and structure. Venous: The inferior vena cava is dilated in size with less than 50% respiratory variability, suggesting right atrial pressure of 15 mmHg. IAS/Shunts: No atrial level shunt detected by color flow Doppler. Additional Comments: Mild ascites is present. LEFT VENTRICLE PLAX 2D LVIDd:         6.00 cm LVIDs:         5.20 cm LV PW:         0.90 cm LV IVS:        1.00 cm LVOT diam:     2.20 cm LV SV:         40 LV SV Index:   16 LVOT Area:     3.80 cm  LV Volumes (MOD) LV vol d, MOD A4C: 200.0 ml LV vol s, MOD A4C: 153.0 ml LV SV MOD A4C:     200.0 ml RIGHT VENTRICLE TAPSE (M-mode): 1.0 cm LEFT ATRIUM         Index LA diam:    4.60 cm 1.86 cm/m  AORTIC VALVE LVOT Vmax:   77.67 cm/s LVOT Vmean:  50.900 cm/s LVOT VTI:    0.105 m  AORTA Ao Root diam: 3.70 cm MR Peak grad: 67.2 mmHg   TRICUSPID VALVE MR Mean grad: 44.0 mmHg   TR Peak grad:   31.8 mmHg MR Vmax:      410.00 cm/s TR Vmax:        282.00 cm/s MR Vmean:      313.0 cm/s                           SHUNTS                           Systemic VTI:  0.10 m                           Systemic Diam: 2.20 cm Rachelle Hora Croitoru MD Electronically signed by Thurmon Fair MD Signature Date/Time: 04/02/2021/9:13:44 AM    Final      Assessment and Plan:   1. Septic versus cardiogenic shock:  - discussed with Dr. [place], with COOX at 65.9, this is more likely to be septic shock rather than cardiogenic shock. See MD recommendation. No plan for aggressive cardiac intervention. Agree with palliative care consult. Continue IV diuresis while monitoring renal function.   2. Right lung pneumonia:  - with likely sepsis, treatment for PCCM, started on 2 abx  3. Acute on chronic combined systolic and diastolic heart failure with diffuse anasarca  - EF dropped from 30-35% in 2019 down to 25% since Apr 2022. Myoview in 2017 showed low risk, but normal perfusion.  - agree with IV diuresis  4. Lactic acidosis: treat underlying infection   5. CAD: no prior record. Remote h/o CAD s/p PCI, previously felt his low EF may be more tachy-mediated.  6. PAD s/p L BKA  7. history of CVA  8. OSA on CPAP  9. HTN  10. HLD  11. DM2   12. recent left BKA secondary to osteomyelitis  13. history of polysubstance abuse   Risk Assessment/Risk Scores:  New York Heart Association (NYHA) Functional Class NYHA Class IV  CHA2DS2-VASc Score = 6  This indicates a 9.7% annual risk of stroke. The patient's score is based upon: CHF History: Yes HTN History: Yes Diabetes History: Yes Stroke History: Yes Vascular Disease History: Yes Age Score: 0 Gender Score: 0         For questions or updates, please contact [facility] Please consult [email or web address] for contact info under    Ramond Dial, [place]  04/02/2021 10:52 AM

## 2021-04-02 NOTE — Consult Note (Signed)
Consultation Note Date: 04/02/2021   Patient Name: Fernando Maddox  DOB: [birth year]  MRN: [id number]  Age / Sex: 60 y.o., male  PCP: Fernando Marble, MD Referring Physician: Margaretha Seeds, MD  Reason for Consultation: Establishing goals of care and Psychosocial/spiritual support  HPI/Patient Profile: 60 y.o. male  admitted on 04/02/2021  with history of CAD, chronic Afib, chronic systolic CHF presents with failure to thrive, confusion, and anorexia. 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 Has severe CHF with biventricular failure. EF 20-25%. On recent admission required midodrine for hypotension. Was treated with diuretics and Toprol XL for rate control. Has been on chronic Xarelto. There has been concern about patient compliance in the past but he has been in a SNF since last discharge. He has a standing DNR order.    On Admission patient noted to be severely acidotic and has diffuse anasarca. CXR concerning for RLL PNA. He has worsening renal function. Anemic with Hgb 9.7. Repeat Echo showed EF 20-25% with biventricular failure and mod to severe MR.    Currently admitted to cardiac intensive care.   Palliative medicine consulted to help with establishing goals of care and emotional support.   Clinical Assessment and Goals of Care:   This NP Fernando Maddox reviewed medical records, received report from team, assessed the patient and then meet at the patient's bedside along with his parents to discuss diagnosis, prognosis, GOC, EOL wishes disposition and options.  Currently patient is too lethargic to participate in conversation.   Concept of Palliative Care was introduced as specialized medical care for people and their families living with serious illness.  If focuses on providing relief from the symptoms and stress of a serious illness.  The goal is  to improve quality of life for both the patient and the family.  Family has met with palliative medicine in the past.  Values and goals of care important to patient and family were attempted to be elicited.  Created space and opportunity for patient  and family to explore thoughts and feelings regarding current medical situation.  Parents verbalized an understanding of the seriousness of the patient's current medical situation.  They recognize that he has been failing to thrive over the past several weeks, he has been in a SNF since discharge from last hospitalization only few weeks ago.  They tell Fernando Maddox's difficult life story of many hardships.  They expressed their love and support for him.    A  discussion was had today regarding advanced directives.  Concepts specific to code status, artifical feeding and hydration, continued IV antibiotics and rehospitalization was had.  The difference between a aggressive medical intervention path  and a palliative comfort care path for this patient at this time was had.     Education offered on hospice benefit.  MOST form introduced.  Hard Choices booklet left for review.     Natural trajectory and expectations at EOL were discussed.  Questions and concerns addressed.  Patient  encouraged to call with questions or concerns.     PMT will continue to support holistically.        No documented H POA.  Parents are next of kin and will act as decision-maker in the event the patient cannot make decisions for himself..  They are interested in securing each POA and advanced directive documents with the support of spiritual care in the event the patient clears and is able to participate in those  decisions.    SUMMARY OF RECOMMENDATIONS    Code Status/Advance Care Planning:  DNR    Symptom Management:   Per attending   Palliative Prophylaxis:   Aspiration, Bowel Regimen, Delirium Protocol, Frequent Pain Assessment and Oral Care  Additional  Recommendations (Limitations, Scope, Preferences):  Full Scope Treatment  Psycho-social/Spiritual:   Desire for further Chaplaincy support:yes   Family hope to secure HPOA and AD documents.  Parentss believe patient would like a visit from the chaplain from a spiritual care perspective.    Additional Recommendations: Education on Hospice  Prognosis:   Unable to determine  Discharge Planning: To Be Determined      Primary Diagnoses: Present on Admission: . Cardiogenic shock ([place]) . Shock ([place])   I have reviewed the medical record, interviewed the patient and family, and examined the patient. The following aspects are pertinent.  Past Medical History:  Diagnosis Date  . Acute combined systolic and diastolic CHF, NYHA class 1 ([place]) 08/07/2016  . Anemia   . Atrial fibrillation ([place])   . BACK PAIN 02/11/2010   Qualifier: Diagnosis of  By: Owens Shark, RN, BSN, Lauren    . CAD (coronary artery disease) 02/11/2010   Qualifier: Diagnosis of  By: Owens Shark, RN, BSN, Lauren    . Cellulitis 09/29/2017  . Cerebrovascular disease   . CHEST PAIN 02/11/2010   Qualifier: Diagnosis of  By: Owens Shark, RN, BSN, Lauren    . Chronic anticoagulation 03/25/2018  . Chronic pain syndrome   . Chronic systolic congestive heart failure ([place]) 03/25/2018  . Community acquired pneumonia 08/11/2016  . DEPRESSION/ANXIETY 02/11/2010   Qualifier: Diagnosis of  By: Owens Shark, RN, BSN, Lauren    . Diabetes mellitus, type 2 ([place]) 02/11/2010   Qualifier: Diagnosis of  By: Owens Shark, RN, BSN, Lauren    . Diabetic ulcer of toe of right foot associated with type 2 diabetes mellitus ([place]) 10/01/2017  . ESOPHAGEAL STRICTURE 02/11/2010   Qualifier: Diagnosis of  By: Owens Shark, RN, BSN, Lauren    . GERD (gastroesophageal reflux disease)   . HEADACHE 02/11/2010   Qualifier: Diagnosis of  By: Owens Shark, RN, BSN, Lauren    . History of CVA (cerebrovascular accident) 09/29/2017  . History of drug abuse ([place]) 08/05/2016  . Hypercholesteremia   .  Hyperlipemia 02/11/2010   Qualifier: Diagnosis of  By: Owens Shark, RN, BSN, Lauren    . Hyperlipidemia   . Hypertension 02/11/2010   Qualifier: Diagnosis of  By: Owens Shark, RN, BSN, Lauren    . HYPERTENSION, UNSPECIFIED 02/11/2010   Qualifier: Diagnosis of  By: Owens Shark, RN, BSN, Lauren    . Hypogonadism male   . 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 Status post thrombectomy  . Osteoarthritis 08/05/2016  . Osteomyelitis, unspecified ([place]) 10/06/2017  . PAF (paroxysmal atrial fibrillation) ([place]) 08/05/2016  . Polysubstance abuse ([place]) 09/29/2017  . Rotator cuff syndrome    Shoulder  . Septic shock ([place]) 02/14/2021  . Shoulder pain   . SIADH (syndrome of inappropriate ADH production) ([place]) 08/07/2016  . Sleep apnea   . Stroke ([place]) 08/05/2016  . Tobacco use 08/11/2016  . Ulcer of left foot with necrosis of bone ([facility])    Social History   Socioeconomic History  . Marital status: Married    Spouse name: Not on file  . Number of children: Not on file  . Years of education: Not on file  . Highest education level: Not on file  Occupational  History  . Not on file  Tobacco Use  . Smoking status: Former Smoker    Types: Cigarettes    Start date: 11/06/2016  . Smokeless tobacco: Former Systems developer    Quit date: 03/06/2016  Substance and Sexual Activity  . Alcohol use: Not on file  . Drug use: Not on file  . Sexual activity: Not on file  Other Topics Concern  . Not on file  Social History Narrative  . Not on file   Social Determinants of Health   Financial Resource Strain: Not on file  Food Insecurity: Not on file  Transportation Needs: Not on file  Physical Activity: Not on file  Stress: Not on file  Social Connections: Not on file   Family History  Problem Relation Age of Onset  . Hypertension Mother   . Hypertension Father    Scheduled Meds: . Chlorhexidine Gluconate Cloth  6 each Topical Daily  .  furosemide  60 mg Intravenous Q6H  . mouth rinse  15 mL Mouth Rinse BID  . sodium chloride flush  10-40 mL Intracatheter Q12H  . sodium chloride flush  3 mL Intravenous Q12H   Continuous Infusions: . sodium chloride    . azithromycin    . ceFEPime (MAXIPIME) IV    . milrinone 0.25 mcg/kg/min (04/02/21 1100)  . sodium bicarbonate 150 mEq in D5W infusion 125 mL/hr at 04/02/21 1100  . [START ON 04/03/2021] vancomycin     PRN Meds:.sodium chloride, docusate sodium, polyethylene glycol, sodium chloride flush, sodium chloride flush Medications Prior to Admission:  Prior to Admission medications   Medication Sig Start Date End Date Taking? Authorizing Provider  acetaminophen (TYLENOL) 325 MG tablet Take 2 tablets (650 mg total) by mouth every 6 (six) hours as needed for mild pain (or Fever >/= 101). 03/08/21  Yes Thurnell Lose, MD  albuterol (PROVENTIL) (2.5 MG/3ML) 0.083% nebulizer solution Inhale 3 mLs into the lungs every 4 (four) hours as needed for wheezing or shortness of breath. 03/08/21  Yes Thurnell Lose, MD  DULoxetine (CYMBALTA) 60 MG capsule Take 60 mg by mouth daily.   Yes [provider]  empagliflozin (JARDIANCE) 10 MG TABS tablet Take 1 tablet (10 mg total) by mouth daily. 03/22/21  Yes Mercy Riding, MD  folic acid (FOLVITE) 1 MG tablet Take 1 mg by mouth daily.   Yes [provider]  furosemide (LASIX) 20 MG tablet Take 1 tablet (20 mg total) by mouth 2 (two) times daily. 03/20/21  Yes Mercy Riding, MD  gabapentin (NEURONTIN) 100 MG capsule Take 100 mg by mouth 3 (three) times daily.   Yes [provider]  insulin aspart (NOVOLOG) 100 UNIT/ML injection Inject 1-5 Units into the skin 4 (four) times daily -  before meals and at bedtime. CBG 70 - 120: 0 units CBG 121 - 150: 0 units CBG 151 - 200: 0 units CBG 201 - 250: 2 units CBG 251 - 300: 3 units CBG 301 - 350: 4 units CBG 351 - 400: 5 units CBG > 400: call MD and obtain STAT lab verification  03/20/21  Yes Gonfa, Charlesetta Ivory, MD  midodrine (PROAMATINE) 5 MG tablet Take 1 tablet (5 mg total) by mouth 3 (three) times daily with meals. 03/09/21  Yes Thurnell Lose, MD  pantoprazole (PROTONIX) 40 MG tablet Take 1 tablet (40 mg total) by mouth daily. 03/08/21  Yes Thurnell Lose, MD  potassium chloride SA (KLOR-CON) 20 MEQ tablet Take 1 tablet (20  mEq total) by mouth 2 (two) times daily. 03/22/21  Yes Mercy Riding, MD  rivaroxaban (XARELTO) 20 MG TABS tablet Take 20 mg by mouth daily with supper.   Yes [provider]  spironolactone (ALDACTONE) 25 MG tablet Take 0.5 tablets (12.5 mg total) by mouth daily. 03/22/21 03/22/22 Yes Mercy Riding, MD  thiamine 100 MG tablet Take 1 tablet (100 mg total) by mouth daily. 03/21/21  Yes Mercy Riding, MD   Allergies  Allergen Reactions  . Codeine Itching and Other (See Comments)    "Allergic," per paperwork from facility  . Bee Venom Other (See Comments)    "Feels like his head is on fire when he goes outside in the sun" and "Allergic," per paperwork from facility  . Hydrocodone-Acetaminophen Itching and Other (See Comments)    "Allergic," per paperwork from facility  . Cephalexin Other (See Comments)    No mention of this allergy in notes from 07/2016 admission for osteomyelitis.  Patient was treated with Ancef and Unasyn for broader anaerobic coverage.  Patient is not aware of allergy. -08/02/2020  On 04/02/2021: "Allergic," per paperwork from facility  . Vicodin [Hydrocodone-Acetaminophen] Itching and Other (See Comments)    "Allergic," per paperwork from facility  . Zocor [Simvastatin] Other (See Comments)    Myalgias and "Allergic," per paperwork from facility   Review of Systems  Unable to perform ROS: Mental status change    Physical Exam Constitutional:      Appearance: He is underweight. He is ill-appearing.     Interventions: Nasal cannula in place.  HENT:     Mouth/Throat:     Mouth: Mucous membranes are dry.   Cardiovascular:     Rate and Rhythm: Normal rate.  Skin:    General: Skin is warm and dry.  Neurological:     Mental Status: He is lethargic.     Vital Signs: BP 111/87   Pulse 98   Temp 97.6 F (36.4 C) (Oral)   Resp 16   SpO2 97%  Pain Scale: 0-10   Pain Score: 0-No pain   SpO2: SpO2: 97 % O2 Device:SpO2: 97 % O2 Flow Rate: .O2 Flow Rate (L/min): 4 L/min  IO: Intake/output summary:   Intake/Output Summary (Last 24 hours) at 04/02/2021 1118 Last data filed at 04/02/2021 1100 Gross per 24 hour  Intake 477.41 ml  Output 225 ml  Net 252.41 ml    LBM:   Baseline Weight:   Most recent weight:       Palliative Assessment/Data: 30 % at best   Discussed with Dr Loanne Drilling via secure chat  Time In: 1300 Time Out: 1415 Time Total: 75 minutes Greater than 50%  of this time was spent counseling and coordinating care related to the above assessment and plan.  Signed by: Fernando Lessen, NP   Please contact Palliative Medicine Team phone at [phone] for questions and concerns.  For individual provider: See Shea Evans

## 2021-04-02 NOTE — Progress Notes (Signed)
Notified bedside nurse of need to draw repeat lactic acid (#3). 

## 2021-04-02 NOTE — ED Triage Notes (Addendum)
Pt BIB via EMS from [facility]. FTT at the facility. Pt has had poor oral intake so it's been difficult to manage. Pt is altered from baseline. CBG was 38 with EMS. 1 amp of D10. Alert and oriented to self. 5L O2 at baseline.   Left side weakness related to past stroke  Fluid restrictions: 1500 ml/day

## 2021-04-02 NOTE — ED Notes (Signed)
CC at bedside inserting central line.

## 2021-04-02 NOTE — Sepsis Progress Note (Signed)
Following for sepsis monitoring. Limited access. Hx of ESRD, antibiotics ordered with Hemodialysis.

## 2021-04-02 NOTE — ED Notes (Signed)
Patient moved to room 23.

## 2021-04-02 NOTE — Progress Notes (Signed)
Notified bedside nurse of need to draw repeat lactic acid #3 now (ordered every 3 hours).

## 2021-04-02 NOTE — Progress Notes (Signed)
Pharmacy Antibiotic Note  Fernando Maddox is a 60 y.o. male form SNF admitted on 04/02/2021 with confusion and undifferentiated shock (septic + cardiogenic?).  Pharmacy has been consulted for vancomycin and cefepime dosing.  Plan: Zosyn x 1 in ED  Cefepime 2 g iv q 8 hours 6 hours post Zosyn dose  Vancomycin 2000 mg IV once followed by 1750 IV Q 24 hrs. Goal AUC 400-550. Expected AUC: 526 SCr used: 1.55  Will f/u renal function, culture results, and clinical course MRSA PCR ordered      Temp (24hrs), Avg:97.6 F (36.4 C), Min:97.6 F (36.4 C), Max:97.6 F (36.4 C)  Recent Labs  Lab 04/02/21 0346 04/02/21 0500 04/02/21 0631  CREATININE  --  1.55*  --   LATICACIDVEN >11.0*  --  >11.0*    Estimated Creatinine Clearance: 71.7 mL/min (A) (by C-G formula based on SCr of 1.55 mg/dL (H)).    Allergies  Allergen Reactions  . Codeine Itching and Other (See Comments)    "Allergic," per paperwork from facility  . Bee Venom Other (See Comments)    "Feels like his head is on fire when he goes outside in the sun" and "Allergic," per paperwork from facility  . Hydrocodone-Acetaminophen Itching and Other (See Comments)    "Allergic," per paperwork from facility  . Cephalexin Other (See Comments)    No mention of this allergy in notes from 07/2016 admission for osteomyelitis.  Patient was treated with Ancef and Unasyn for broader anaerobic coverage.  Patient is not aware of allergy. -08/02/2020  On 03/15/2021: "Allergic," per paperwork from facility  . Vicodin [Hydrocodone-Acetaminophen] Itching and Other (See Comments)    "Allergic," per paperwork from facility  . Zocor [Simvastatin] Other (See Comments)    Myalgias and "Allergic," per paperwork from facility    Antimicrobials this admission: 6/7 Zosyn x 1 6/7 cefepime >>  6/7 vancomycin >>  Dose adjustments this admission:  Microbiology results: 6/7 BCx:  6/7 MRSA PCR:  6/7 COVID:   Thank you for allowing pharmacy to be a  part of this patient's care.  Luisa Hart D 04/02/2021 8:12 AM

## 2021-04-02 NOTE — Progress Notes (Signed)
A consult was received from an ED physician for Vancomycin & Zosyn per pharmacy dosing.  The patient's profile has been reviewed for ht/wt/allergies/indication/available labs.   A one time order has been placed for Zosyn 3.375gm & Vancomycin 2gm IV.  Further antibiotics/pharmacy consults should be ordered by admitting physician if indicated.                       Thank you, Junita Push PharmD 04/02/2021  6:07 AM

## 2021-04-02 NOTE — Progress Notes (Signed)
  Echocardiogram 2D Echocardiogram has been performed.  Augustine Radar 04/02/2021, 8:49 AM

## 2021-04-02 NOTE — ED Notes (Signed)
Bright red blood visualized in emesis. Dr. Eudelia Bunch notified and at bedside to assess.

## 2021-04-03 ENCOUNTER — Other Ambulatory Visit: Payer: Self-pay

## 2021-04-03 DIAGNOSIS — A419 Sepsis, unspecified organism: Principal | ICD-10-CM

## 2021-04-03 DIAGNOSIS — D649 Anemia, unspecified: Secondary | ICD-10-CM

## 2021-04-03 DIAGNOSIS — R6521 Severe sepsis with septic shock: Secondary | ICD-10-CM

## 2021-04-03 DIAGNOSIS — E872 Acidosis: Secondary | ICD-10-CM

## 2021-04-03 DIAGNOSIS — I4811 Longstanding persistent atrial fibrillation: Secondary | ICD-10-CM

## 2021-04-03 DIAGNOSIS — N179 Acute kidney failure, unspecified: Secondary | ICD-10-CM

## 2021-04-03 LAB — PROTIME-INR
INR: 6.2 (ref 0.8–1.2)
INR: 3.9 — ABNORMAL HIGH (ref 0.8–1.2)
INR: 3.1 — ABNORMAL HIGH (ref 0.8–1.2)
Prothrombin Time: 55 seconds — ABNORMAL HIGH (ref 11.4–15.2)
Prothrombin Time: 38.3 seconds — ABNORMAL HIGH (ref 11.4–15.2)
Prothrombin Time: 32.1 seconds — ABNORMAL HIGH (ref 11.4–15.2)

## 2021-04-03 LAB — COMPREHENSIVE METABOLIC PANEL
ALT: 142 U/L — ABNORMAL HIGH (ref 0–44)
AST: 297 U/L — ABNORMAL HIGH (ref 15–41)
Albumin: 2.2 g/dL — ABNORMAL LOW (ref 3.5–5.0)
Alkaline Phosphatase: 78 U/L (ref 38–126)
Anion gap: 11 (ref 5–15)
BUN: 43 mg/dL — ABNORMAL HIGH (ref 6–20)
CO2: 29 mmol/L (ref 22–32)
Calcium: 8.1 mg/dL — ABNORMAL LOW (ref 8.9–10.3)
Chloride: 93 mmol/L — ABNORMAL LOW (ref 98–111)
Creatinine, Ser: 1.56 mg/dL — ABNORMAL HIGH (ref 0.61–1.24)
GFR, Estimated: 51 mL/min — ABNORMAL LOW (ref >60)
Glucose, Bld: 172 mg/dL — ABNORMAL HIGH (ref 70–99)
Potassium: 3.8 mmol/L (ref 3.5–5.1)
Sodium: 133 mmol/L — ABNORMAL LOW (ref 135–145)
Total Bilirubin: 3.5 mg/dL — ABNORMAL HIGH (ref 0.3–1.2)
Total Protein: 5.5 g/dL — ABNORMAL LOW (ref 6.5–8.1)

## 2021-04-03 LAB — CBC
HCT: 29.6 % — ABNORMAL LOW (ref 39.0–52.0)
Hemoglobin: 9.2 g/dL — ABNORMAL LOW (ref 13.0–17.0)
MCH: 27.4 pg (ref 26.0–34.0)
MCHC: 31.1 g/dL (ref 30.0–36.0)
MCV: 88.1 fL (ref 80.0–100.0)
Platelets: 269 10*3/uL (ref 150–400)
RBC: 3.36 MIL/uL — ABNORMAL LOW (ref 4.22–5.81)
RDW: 19.3 % — ABNORMAL HIGH (ref 11.5–15.5)
WBC: 12.4 10*3/uL — ABNORMAL HIGH (ref 4.0–10.5)
nRBC: 0 % (ref 0.0–0.2)

## 2021-04-03 LAB — PHOSPHORUS: Phosphorus: 3.6 mg/dL (ref 2.5–4.6)

## 2021-04-03 LAB — APTT
aPTT: >200 seconds (ref 24–36)
aPTT: 62 seconds — ABNORMAL HIGH (ref 24–36)
aPTT: 142 seconds — ABNORMAL HIGH (ref 24–36)

## 2021-04-03 LAB — GLUCOSE, CAPILLARY
Glucose-Capillary: 137 mg/dL — ABNORMAL HIGH (ref 70–99)
Glucose-Capillary: 165 mg/dL — ABNORMAL HIGH (ref 70–99)
Glucose-Capillary: 168 mg/dL — ABNORMAL HIGH (ref 70–99)
Glucose-Capillary: 169 mg/dL — ABNORMAL HIGH (ref 70–99)
Glucose-Capillary: 174 mg/dL — ABNORMAL HIGH (ref 70–99)
Glucose-Capillary: 188 mg/dL — ABNORMAL HIGH (ref 70–99)
Glucose-Capillary: 205 mg/dL — ABNORMAL HIGH (ref 70–99)

## 2021-04-03 LAB — TROPONIN I (HIGH SENSITIVITY): Troponin I (High Sensitivity): 28 ng/L — ABNORMAL HIGH (ref <18)

## 2021-04-03 LAB — COOXEMETRY PANEL
Carboxyhemoglobin: 1.9 % — ABNORMAL HIGH (ref 0.5–1.5)
Methemoglobin: 0.7 % (ref 0.0–1.5)
O2 Saturation: 68.4 %
Total hemoglobin: 9.3 g/dL — ABNORMAL LOW (ref 12.0–16.0)

## 2021-04-03 LAB — PROCALCITONIN: Procalcitonin: 2.57 ng/mL

## 2021-04-03 LAB — HEPARIN LEVEL (UNFRACTIONATED): Heparin Unfractionated: >1.1 IU/mL — ABNORMAL HIGH (ref 0.30–0.70)

## 2021-04-03 LAB — MAGNESIUM: Magnesium: 1.7 mg/dL (ref 1.7–2.4)

## 2021-04-03 MED ORDER — POTASSIUM CHLORIDE CRYS ER 20 MEQ PO TBCR: 40.0000 meq | EXTENDED_RELEASE_TABLET | Freq: Two times a day (BID) | ORAL | Status: DC

## 2021-04-03 MED ORDER — DIGOXIN 0.25 MG/ML IJ SOLN
0.1250 mg | Freq: Every day | INTRAMUSCULAR | Status: DC
  Administered 2021-04-03: 0.125 mg via INTRAVENOUS
  Filled 2021-04-03: qty 2

## 2021-04-03 MED ORDER — ENSURE ENLIVE PO LIQD
237.0000 mL | Freq: Three times a day (TID) | ORAL | Status: DC
  Administered 2021-04-03 – 2021-04-05 (×4): 237 mL via ORAL

## 2021-04-03 MED ORDER — ADULT MULTIVITAMIN W/MINERALS CH
1.0000 | ORAL_TABLET | Freq: Every day | ORAL | Status: DC
  Administered 2021-04-03 – 2021-04-05 (×3): 1 via ORAL
  Filled 2021-04-03 (×3): qty 1

## 2021-04-03 MED ORDER — FENTANYL CITRATE (PF) 100 MCG/2ML IJ SOLN
25.0000 ug | INTRAMUSCULAR | Status: DC | PRN
  Administered 2021-04-03: 50 ug via INTRAVENOUS
  Filled 2021-04-03: qty 2

## 2021-04-03 MED ORDER — HEPARIN (PORCINE) 25000 UT/250ML-% IV SOLN: 900.0000 [IU]/h | INTRAVENOUS | Status: DC

## 2021-04-03 MED ORDER — INSULIN GLARGINE 100 UNIT/ML ~~LOC~~ SOLN
10.0000 [IU] | Freq: Every day | SUBCUTANEOUS | Status: DC
  Administered 2021-04-03 – 2021-04-05 (×3): 10 [IU] via SUBCUTANEOUS
  Filled 2021-04-03 (×4): qty 0.1

## 2021-04-03 MED ORDER — HEPARIN (PORCINE) 25000 UT/250ML-% IV SOLN
1200.0000 [IU]/h | INTRAVENOUS | Status: DC
  Administered 2021-04-03: 1200 [IU]/h via INTRAVENOUS
  Filled 2021-04-03: qty 250

## 2021-04-03 MED ORDER — VITAMIN K1 10 MG/ML IJ SOLN
5.0000 mg | Freq: Once | INTRAVENOUS | Status: AC
  Administered 2021-04-03: 5 mg via INTRAVENOUS
  Filled 2021-04-03: qty 0.5

## 2021-04-03 MED ORDER — MAGNESIUM SULFATE 2 GM/50ML IV SOLN
2.0000 g | Freq: Once | INTRAVENOUS | Status: AC
  Administered 2021-04-03: 2 g via INTRAVENOUS
  Filled 2021-04-03: qty 50

## 2021-04-03 MED ORDER — SODIUM CHLORIDE 0.9% IV SOLUTION: Freq: Once | INTRAVENOUS | Status: AC

## 2021-04-03 MED ORDER — POTASSIUM CHLORIDE 10 MEQ/50ML IV SOLN
10.0000 meq | INTRAVENOUS | Status: AC
  Administered 2021-04-03 (×5): 10 meq via INTRAVENOUS
  Filled 2021-04-03 (×5): qty 50

## 2021-04-03 NOTE — Progress Notes (Signed)
Initial Nutrition Assessment  DOCUMENTATION CODES:   Non-severe (moderate) malnutrition in context of chronic illness  INTERVENTION:   Ensure Enlive po TID, each supplement provides 350 kcal and 20 grams of protein  Magic cup TID with meals, each supplement provides 290 kcal and 9 grams of protein  MVI with Minerals daily  Recommend liberalizing diet if po intake inadequate   NUTRITION DIAGNOSIS:   Moderate Malnutrition related to chronic illness as evidenced by moderate fat depletion,severe muscle depletion,moderate muscle depletion,edema.  GOAL:   Patient will meet greater than or equal to 90% of their needs  MONITOR:   PO intake,Supplement acceptance,Labs,Weight trends,Skin  REASON FOR ASSESSMENT:   Rounds    ASSESSMENT:   60 yo male admitted with FTT, anorexia, confusion with shock-mixed cardiogenc and septic, metabolic encephalopathy, acute respiratory failure.   PMH includes CHF, CAD, DM, recent BKA, esophageal stricture, SIADH, HTN, HLD, stroke, hx of prior tobacco and polysubstance abuse.   Pt is DNR, noted palliative care consulted  Pt with recent L BKA 4 weeks ago  Currently NPO. On visit today, pt lethargic but arousable. Falls asleep during conversation. Unable to get diet and weight history from patient at this time  Discussed possibility of Cortrak insertion given NPO status and FTT, prescence of malnutrition and wounds if pt agrees and algins with GOC. MD in agreement but INR >6, plan to hold on Cortrak today  Later this afternoon, pt more alert and evaluated by SLP and diet advanced. Also noted per palliative care that pt would not want artificial feeding  Labs: reviewed Meds: lasix, ss novolog, lantus   NUTRITION - FOCUSED PHYSICAL EXAM:  Flowsheet Row Most Recent Value  Orbital Region Moderate depletion  Upper Arm Region Unable to assess  Thoracic and Lumbar Region Mild depletion  Buccal Region Moderate depletion  Temple Region Moderate  depletion  Clavicle Bone Region Moderate depletion  Clavicle and Acromion Bone Region Severe depletion  Scapular Bone Region Severe depletion  Patellar Region Unable to assess  Anterior Thigh Region Unable to assess  Posterior Calf Region Unable to assess  Edema (RD Assessment) Severe       Diet Order:   Diet Order            Diet heart healthy/carb modified Room service appropriate? Yes; Fluid consistency: Thin; Fluid restriction: 1500 mL Fluid  Diet effective now                 EDUCATION NEEDS:   Not appropriate for education at this time  Skin:  Skin Assessment: Skin Integrity Issues: Skin Integrity Issues:: DTI,Other (Comment) DTI: sacrum Other: recent L BKA 4 weeks ago  Last BM:  PTA  Height:   Ht Readings from Last 1 Encounters:  04/02/21 6\' 3"  (1.905 m)    Weight:   Wt Readings from Last 1 Encounters:  04/03/21 117.5 kg    BMI:  Body mass index is 32.38 kg/m.  Estimated Nutritional Needs:   Kcal:  2500-2700 kcals  Protein:  140-160 g  Fluid:  1.5 L    06/03/21 MS, RDN, LDN, CNSC Registered Dietitian III Clinical Nutrition RD Pager and On-Call Pager Number Located in [place]

## 2021-04-03 NOTE — Progress Notes (Signed)
NAME:  Fernando Maddox, MRN:  [id number], DOB:  May 20, 1961, LOS: 1 ADMISSION DATE:  04/02/2021, CONSULTATION DATE:  04/02/21 REFERRING MD:  ER, CHIEF COMPLAINT:  FTT   History of Present Illness:  60 year old man with recent admission for likely cardiogenic shock presenting from SNF with FTT, anorexia, developing confusion.  Found to be hypoglycemic with lactate > 11.  PCCM consulted for admission.  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 04/02/21 Admitted, transferred to [facility] for heart failure. Coox 73%, mixed septic and cardiogenic shock. Edema to his shoulders. On milrinone, CAP antibiotics. . 6/8 wound care and ortho consulted for BKA  Interim History / Subjective:  His main complaint is wanting to eat. Still falls asleep mid- conversation.  Objective   Blood pressure 112/73, pulse 95, temperature (!) 96.5 F (35.8 C), temperature source Axillary, resp. rate 12, height 6\' 3"  (1.905 m), weight 117.5 kg, SpO2 100 %. CVP:  [12 mmHg-22 mmHg] 12 mmHg      Intake/Output Summary (Last 24 hours) at 04/03/2021 1058 Last data filed at 04/03/2021 1000 Gross per 24 hour  Intake 4024.2 ml  Output 8175 ml  Net -4150.8 ml   Filed Weights   04/02/21 1300 04/03/21 0600  Weight: 120.3 kg 117.5 kg    Examination: Constitutional: chronically  ill appearing man lying in bed in NAD  HEENT: [place]/AT, eyes anicteric Cardiovascular: tachycardic, reg rhythm Respiratory: no tachypnea or accessory muscle use, breathing comfortably on 4L [place], reduced basilar breath sounds, posterobasilar rhales Gastrointestinal: obese, soft, NT Skin: chronic venous stasis changes, L BKA-- stump with dressing with dried blood, staples remain. Does not appear infected. R foot missing digits. Neurologic: moving all extremities, answering questions appropriately but falls back asleep easily.  Psychiatric: shouting, aggressive speech intermittently   Labs/imaging that I havepersonally reviewed  (right click and "Reselect all SmartList Selections" daily)  coox 68% Na+ 133 BUN 43 Cr 1.56 AST 297 ALT 142 Tbili 3.5 WBC 12.4 PCT 2.99 >2.57  Resolved Hospital Problem list     Assessment & Plan:  Shock- mixed cardiac and septic Acute on chronic HFrEF due to ICM, CAD with severe hypervolemia Chronic Afib, rate controlled -responding well to milrinone -diuresis -heparin gtt -appreciate cardiology's input- adding digoxin, con't holding Bblocker -tele monitoring -serial coox   Acute respiratory failure with hypoxia Multilobar pneumonia; probably less of a contributor to shock given favorable response to milrinone -con't antibiotics to complete 7 days -diuresis -wean O2 as able -pulmonary hygiene  PAD s/p BKA, previous dehisced TMA -consult to wound care and ortho- Dr. 06/03/21  Metabolic encephaloapthy due to sepsis -still not alert enough consistently for a diet; can start when he has improved -delirium precautions -ammonia WNL  AKI severe AGMA> resolved -stop bicarb gtt -strict I/Os -renally dose meds, avoid nephrotoxic meds -con't to monitor renal function  Hypokalemia -replete IV -con't to monitor electrolytes  Congestive hepatopathy- elevated bilirubin and  transaminases -con't to monitor -diuresis  Hyperglycemia, uncontrolled -start  lantus 10 units today -SSI PRN -goal BG 140-180 while in ICU  Question of SIADH PTA; would currently favor heart failure -diuresis -monitor Na+  Chronic anemia due to critical illness -transfuse for Hb <7 or hemodynamically significant bleeding  Muscular deconditioning -PT, OT  Recurrent admissions with severe underlying disease -palliative care consult -agree with patient that DNR is appropriate    Best practice (right click and "Reselect all SmartList Selections" daily)  Diet:  NPO Pain/Anxiety/Delirium protocol (if indicated): No VAP protocol (if indicated): Not indicated DVT prophylaxis: SCD and Systemic AC GI prophylaxis: N/A Glucose control:  SSI Yes and Basal insulin Yes Central venous access:  Yes, and it is still needed Arterial line:  N/A Foley:  N/A Mobility:  bed rest  PT consulted: N/A Last date of multidisciplinary goals of care discussion [pending, palliative consult placed] Code Status:  DNR Disposition: ICU  Labs   CBC: Recent Labs  Lab 04/02/21 0928 04/03/21 0434  WBC 14.5* 12.4*  HGB 9.7* 9.2*  HCT 32.7* 29.6*  MCV 89.8 88.1  PLT 289 269    Basic Metabolic Panel: Recent Labs  Lab 04/02/21 0500 04/02/21 0947 04/02/21 2002 04/03/21 0434  NA 128* 132* 133* 133*  K SPECIMEN HEMOLYZED. HEMOLYSIS MAY AFFECT INTEGRITY OF RESULTS. 5.5* 4.6 3.8  CL 92* 96* 94* 93*  CO2 11* 20* 29 29  GLUCOSE 319* 189* 144* 172*  BUN 33* 40* 42* 43*  CREATININE 1.55* 1.81* 1.64* 1.56*  CALCIUM 8.9 8.5* 8.3* 8.1*  MG  --   --  1.9 1.7  PHOS  --   --   --  3.6   GFR: Estimated Creatinine Clearance: 70.5 mL/min (A) (by C-G formula based on SCr of 1.56 mg/dL (H)). Recent Labs  Lab 04/02/21 0346 04/02/21 0631 04/02/21 0928 04/02/21 0947 04/02/21 1220 04/02/21 1547 04/03/21 0434  PROCALCITON  --   --   --   --   --  2.99 2.57  WBC  --   --  14.5*  --   --   --  12.4*  LATICACIDVEN >11.0* >11.0*  --  7.7* 5.5*  --   --     Liver Function  Tests: Recent Labs  Lab 04/02/21 0500 04/03/21 0434  AST 549* 297*  ALT 133* 142*  ALKPHOS 92 78  BILITOT 5.4* 3.5*  PROT 6.2* 5.5*  ALBUMIN 2.6* 2.2*   No results for input(s): LIPASE, AMYLASE in the last 168 hours. Recent Labs  Lab 04/02/21 0928  AMMONIA 27    ABG    Component Value Date/Time   PHART 7.287 (L) 03/15/2021 1349   PCO2ART 19.7 (LL) 03/15/2021 1349   PO2ART 105 03/15/2021 1349   HCO3 11.6 (L) 04/02/2021 0553   TCO2 10 (L) 03/15/2021 1349   ACIDBASEDEF 14.7 (H) 04/02/2021 0553   O2SAT 68.4 04/03/2021 0434      This patient is critically ill with multiple organ system failure which requires frequent high complexity decision making, assessment, support, evaluation, and titration of therapies. This was completed through the application of advanced monitoring technologies and extensive interpretation of multiple databases. During this encounter critical care time was devoted to patient care services described in this note for 45 minutes.  Steffanie Dunn, DO 04/03/21 11:21 AM [facility]

## 2021-04-03 NOTE — Progress Notes (Signed)
ANTICOAGULATION CONSULT NOTE   Pharmacy Consult for: Heparin Indication: atrial fibrillation  Allergies  Allergen Reactions  . Codeine Itching and Other (See Comments)    "Allergic," per paperwork from facility  . Bee Venom Other (See Comments)    "Feels like his head is on fire when he goes outside in the sun" and "Allergic," per paperwork from facility  . Hydrocodone-Acetaminophen Itching and Other (See Comments)    "Allergic," per paperwork from facility  . Cephalexin Other (See Comments)    No mention of this allergy in notes from 07/2016 admission for osteomyelitis.  Patient was treated with Ancef and Unasyn for broader anaerobic coverage.  Patient is not aware of allergy. -08/02/2020  On 04/02/2021: "Allergic," per paperwork from facility  . Vicodin [Hydrocodone-Acetaminophen] Itching and Other (See Comments)    "Allergic," per paperwork from facility  . Zocor [Simvastatin] Other (See Comments)    Myalgias and "Allergic," per paperwork from facility    Patient Measurements: Height: 6\' 3"  (190.5 cm) Weight: 117.5 kg (259 lb 0.7 oz) IBW/kg (Calculated) : 84.5 Heparin Dosing Weight: 110 kg  Vital Signs: Temp: 96.4 F (35.8 C) (06/08 1134) Temp Source: Oral (06/08 1134) BP: 112/73 (06/08 1000) Pulse Rate: 95 (06/08 1000)  Labs: Recent Labs    04/02/21 0928 04/02/21 0947 04/02/21 2002 04/02/21 2002 04/02/21 2241 04/03/21 0213 04/03/21 0434 04/03/21 1028  HGB 9.7*  --   --   --   --   --  9.2*  --   HCT 32.7*  --   --   --   --   --  29.6*  --   PLT 289  --   --   --   --   --  269  --   APTT  --   --  >200*   < > >200* 62*  --  142*  CREATININE  --  1.81* 1.64*  --   --   --  1.56*  --    < > = values in this interval not displayed.    Estimated Creatinine Clearance: 70.5 mL/min (A) (by C-G formula based on SCr of 1.56 mg/dL (H)).  Assessment: 60 yo WM w/ hx of CAD, chronic Afib (on PTA Xarelto), systolic HF - presenting with failure to thrive, confusion and  anorexia. He is on heparin while unable to take PO. -aPTT= 142 (was 62 this morning when heparin was held)  Goal of Therapy:  aPTT 66-102 seconds Monitor platelets by anticoagulation protocol: Yes   Plan:  -hold heparin for 1 hour then decrease to 900 units/hr -Heparin level in 6 hours and daily wth CBC daily  46, PharmD Clinical Pharmacist **Pharmacist phone directory can now be found on [email or web address] ([email or web address]).  Listed under [facility].

## 2021-04-03 NOTE — Consult Note (Signed)
ORTHOPAEDIC CONSULTATION  REQUESTING PHYSICIAN: Steffanie Dunn, DO  Chief Complaint: Pain left below-knee amputation.  HPI: Fernando Maddox is a 59 y.o. male who presents with left below the knee amputation he is 4 weeks out from surgery patient has not returned for follow-up evaluation.  Past Medical History:  Diagnosis Date  . Acute combined systolic and diastolic CHF, NYHA class 1 (HCC) 08/07/2016  . Acute combined systolic and diastolic congestive heart failure (HCC)   . Acute metabolic encephalopathy 03/18/2021  . Acute on chronic systolic heart failure (HCC) 03/25/2018  . AKI (acute kidney injury) (HCC) 03/18/2021  . Anemia   . Atrial fibrillation (HCC)   . BACK PAIN 02/11/2010   Qualifier: Diagnosis of  By: Manson Passey, RN, BSN, Lauren    . CAD (coronary artery disease) 02/11/2010   Qualifier: Diagnosis of  By: Manson Passey, RN, BSN, Lauren    . Cardiogenic shock (HCC) 04/02/2021  . Cellulitis 09/29/2017  . Cerebrovascular disease   . CHEST PAIN 02/11/2010   Qualifier: Diagnosis of  By: Manson Passey, RN, BSN, Lauren    . Chronic anticoagulation 03/25/2018  . Chronic pain syndrome   . Chronic systolic congestive heart failure (HCC) 03/25/2018  . Community acquired pneumonia 08/11/2016  . Dehiscence of amputation stump (HCC)   . DEPRESSION/ANXIETY 02/11/2010   Qualifier: Diagnosis of  By: Manson Passey, RN, BSN, Lauren    . Diabetes mellitus, type 2 (HCC) 02/11/2010   Qualifier: Diagnosis of  By: Manson Passey, RN, BSN, Lauren    . Diabetic ulcer of toe of right foot associated with type 2 diabetes mellitus (HCC) 10/01/2017  . ESOPHAGEAL STRICTURE 02/11/2010   Qualifier: Diagnosis of  By: Manson Passey, RN, BSN, Lauren    . Foot ulcer (HCC)   . GERD (gastroesophageal reflux disease)   . HEADACHE 02/11/2010   Qualifier: Diagnosis of  By: Manson Passey, RN, BSN, Lauren    . History of CVA (cerebrovascular accident) 09/29/2017  . History of drug abuse (HCC) 08/05/2016  . Hypercholesteremia   . Hyperlipemia 02/11/2010   Qualifier:  Diagnosis of  By: Manson Passey, RN, BSN, Lauren    . Hyperlipidemia   . Hypertension 02/11/2010   Qualifier: Diagnosis of  By: Manson Passey, RN, BSN, Lauren    . HYPERTENSION, UNSPECIFIED 02/11/2010   Qualifier: Diagnosis of  By: Manson Passey, RN, BSN, Lauren    . Hypogonadism male   . Hyponatremia   . Lactic acidosis   . 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 Status post thrombectomy  . Normocytic anemia   . Osteoarthritis 08/05/2016  . Osteomyelitis, unspecified (HCC) 10/06/2017  . PAF (paroxysmal atrial fibrillation) (HCC) 08/05/2016  . Polysubstance abuse (HCC) 09/29/2017  . Rotator cuff syndrome    Shoulder  . Septic shock (HCC) 02/14/2021  . Shock (HCC) 04/02/2021  . Shoulder pain   . 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 Sleep apnea   . Stroke (HCC) 08/05/2016  . Tobacco use 08/11/2016  . Transaminitis 03/18/2021  . Ulcer of left foot with necrosis of bone [facility]    Past Surgical History:  Procedure Laterality Date  . AMPUTATION Left 02/20/2021   Procedure: LEFT TRANSMETATARSAL AMPUTATION;  Surgeon: [name], MD;  Location: [facility] OR;  Service: Orthopedics;  Laterality: Left;  . AMPUTATION Left 03/06/2021   Procedure: LEFT BELOW KNEE AMPUTATION;  Surgeon: [name], MD;  Location: [facility] OR;  Service: Orthopedics;  Laterality: Left;  . NO  PAST SURGERIES     Social History   Socioeconomic History  . Marital status: Married    Spouse name: Not on file  . Number of children: Not on file  . Years of education: Not on file  . Highest education level: Not on file  Occupational History  . Not on file  Tobacco Use  . Smoking status: Former Smoker    Types: Cigarettes    Start date: 11/06/2016  . Smokeless tobacco: Former Neurosurgeon    Quit date: 03/06/2016  Substance and Sexual Activity  . Alcohol use: Not on file  . Drug use: Not on file  . Sexual activity: Not on file  Other Topics  Concern  . Not on file  Social History Narrative  . Not on file   Social Determinants of Health   Financial Resource Strain: Not on file  Food Insecurity: Not on file  Transportation Needs: Not on file  Physical Activity: Not on file  Stress: Not on file  Social Connections: Not on file   Family History  Problem Relation Age of Onset  . Hypertension Mother   . Hypertension Father    - negative except otherwise stated in the family history section Allergies  Allergen Reactions  . Codeine Itching and Other (See Comments)    "Allergic," per paperwork from facility  . Bee Venom Other (See Comments)    "Feels like his head is on fire when he goes outside in the sun" and "Allergic," per paperwork from facility  . Hydrocodone-Acetaminophen Itching and Other (See Comments)    "Allergic," per paperwork from facility  . Cephalexin Other (See Comments)    No mention of this allergy in notes from 07/2016 admission for osteomyelitis.  Patient was treated with Ancef and Unasyn for broader anaerobic coverage.  Patient is not aware of allergy. -08/02/2020  On 04/02/2021: "Allergic," per paperwork from facility  . Vicodin [Hydrocodone-Acetaminophen] Itching and Other (See Comments)    "Allergic," per paperwork from facility  . Zocor [Simvastatin] Other (See Comments)    Myalgias and "Allergic," per paperwork from facility   Prior to Admission medications   Medication Sig Start Date End Date Taking? Authorizing Provider  acetaminophen (TYLENOL) 325 MG tablet Take 2 tablets (650 mg total) by mouth every 6 (six) hours as needed for mild pain (or Fever >/= 101). 03/08/21  Yes Leroy Sea, MD  albuterol (PROVENTIL) (2.5 MG/3ML) 0.083% nebulizer solution Inhale 3 mLs into the lungs every 4 (four) hours as needed for wheezing or shortness of breath. 03/08/21  Yes Leroy Sea, MD  DULoxetine (CYMBALTA) 60 MG capsule Take 60 mg by mouth daily.   Yes [provider]  empagliflozin  (JARDIANCE) 10 MG TABS tablet Take 1 tablet (10 mg total) by mouth daily. 03/22/21  Yes Almon Hercules, MD  folic acid (FOLVITE) 1 MG tablet Take 1 mg by mouth daily.   Yes [provider]  furosemide (LASIX) 20 MG tablet Take 1 tablet (20 mg total) by mouth 2 (two) times daily. 03/20/21  Yes Almon Hercules, MD  gabapentin (NEURONTIN) 100 MG capsule Take 100 mg by mouth 3 (three) times daily.   Yes [provider]  insulin aspart (NOVOLOG) 100 UNIT/ML injection Inject 1-5 Units into the skin 4 (four) times daily -  before meals and at bedtime. CBG 70 - 120: 0 units CBG 121 - 150: 0 units CBG 151 - 200: 0 units CBG 201 - 250: 2 units CBG 251 -  300: 3 units CBG 301 - 350: 4 units CBG 351 - 400: 5 units CBG > 400: call MD and obtain STAT lab verification 03/20/21  Yes Gonfa, Boyce Medici, MD  midodrine (PROAMATINE) 5 MG tablet Take 1 tablet (5 mg total) by mouth 3 (three) times daily with meals. 03/09/21  Yes Leroy Sea, MD  pantoprazole (PROTONIX) 40 MG tablet Take 1 tablet (40 mg total) by mouth daily. 03/08/21  Yes Leroy Sea, MD  potassium chloride SA (KLOR-CON) 20 MEQ tablet Take 1 tablet (20 mEq total) by mouth 2 (two) times daily. 03/22/21  Yes Almon Hercules, MD  rivaroxaban (XARELTO) 20 MG TABS tablet Take 20 mg by mouth daily with supper.   Yes [provider]  spironolactone (ALDACTONE) 25 MG tablet Take 0.5 tablets (12.5 mg total) by mouth daily. 03/22/21 03/22/22 Yes Almon Hercules, MD  thiamine 100 MG tablet Take 1 tablet (100 mg total) by mouth daily. 03/21/21  Yes Almon Hercules, MD  losartan (COZAAR) 25 MG tablet Take 25 mg by mouth 2 (two) times daily. 03/20/21   [provider]   CT Head Wo Contrast  Result Date: 04/02/2021 CLINICAL DATA:  Mental status change with unknown cause EXAM: CT HEAD WITHOUT CONTRAST TECHNIQUE: Contiguous axial images were obtained from the base of the skull through the vertex without intravenous contrast. COMPARISON:   03/15/2021 FINDINGS: Brain: Large remote right MCA territory infarct with dense encephalomalacia throughout the territory. No evidence of acute infarct, hemorrhage, hydrocephalus, or mass. Vascular: No hyperdense vessel or unexpected calcification. Skull: Normal. Negative for fracture or focal lesion. Sinuses/Orbits: Negative Other: Moderately motion degraded. IMPRESSION: 1. Motion degraded study without acute finding. 2. Remote MCA territory infarct. Electronically Signed   By: Marnee Spring M.D.   On: 04/02/2021 07:23   CT CHEST WO CONTRAST  Result Date: 04/02/2021 CLINICAL DATA:  Aspiration.  Abnormal chest x-ray EXAM: CT CHEST WITHOUT CONTRAST TECHNIQUE: Multidetector CT imaging of the chest was performed following the standard protocol without IV contrast. COMPARISON:  02/03/2021 FINDINGS: Cardiovascular: Cardiomegaly. No pericardial effusion. Extensive atheromatous calcification of the coronaries. No acute vascular finding without contrast Mediastinum/Nodes: No adenopathy or air leak Lungs/Pleura: Multifocal airspace disease affecting all lobes and most confluent at the right upper lobe. Small left and more moderate right layering pleural fluid that appears low-density. No septal thickening. No air leak Upper Abdomen: Negative Musculoskeletal: Spondylosis. Remote bilateral rib fractures. No acute finding. Other: Motion artifact. IMPRESSION: 1. Multifocal pneumonia. 2. Layering pleural effusions, moderate on the right and small on the left. 3. Cardiomegaly and extensive coronary atherosclerosis. Electronically Signed   By: Marnee Spring M.D.   On: 04/02/2021 07:25   DG CHEST PORT 1 VIEW  Result Date: 04/02/2021 CLINICAL DATA:  Central catheter placement EXAM: PORTABLE CHEST 1 VIEW COMPARISON:  April 02, 2021 chest radiograph obtained earlier in the day as well as April 02, 2021 chest CT. FINDINGS: Central catheter tip is in the superior vena cava. No pneumothorax. Airspace opacity in the right upper lobe  consistent with pneumonia persists. There is ill-defined opacity in portions of each lung base, also likely foci of pneumonia. There is cardiac enlargement with pulmonary vascularity normal. No adenopathy. No bone lesions. IMPRESSION: Central catheter tip in superior vena cava without pneumothorax. Extensive airspace opacity consistent with pneumonia right lobe with ill-defined areas of bibasilar opacity, likely foci of pneumonia as well. Stable cardiomegaly. Electronically Signed   By: Bretta Bang III M.D.   On:  04/02/2021 10:21   DG Chest Port 1 View  Result Date: 04/02/2021 CLINICAL DATA:  Edema. EXAM: PORTABLE CHEST 1 VIEW COMPARISON:  03/16/2021. FINDINGS: Interval removal of left IJ line. Cardiomegaly. Bilateral multifocal infiltrates/edema, right side greater than left. Small bilateral pleural effusions cannot be excluded. No pneumothorax. Old left rib fractures noted. IMPRESSION: 1.  Interval removal of left IJ line.  No pneumothorax. 2.  Cardiomegaly. 3. Bilateral multifocal infiltrates/edema, right side greater than left. CHF and/or multifocal pneumonia could present this fashion. Small bilateral pleural effusions. Electronically Signed   By: Maisie Fus  Register   On: 04/02/2021 05:18   ECHOCARDIOGRAM LIMITED  Result Date: 04/02/2021    ECHOCARDIOGRAM LIMITED REPORT   Patient Name:   Fernando Maddox Date of Exam: 04/02/2021 Medical Rec #:  [id number]     Height:       75.0 in Accession #:    [id number]    Weight:       265.2 lb Date of Birth:  09/07/1961     BSA:          2.474 m Patient Age:    59 years      BP:           103/87 mmHg Patient Gender: M             HR:           103 bpm. Exam Location:  Inpatient Procedure: Limited Echo, Limited Color Doppler and Cardiac Doppler                     STAT ECHO Reported to: Dr. Royann Shivers on 04/02/2021 8:38:00 AM. Indications:    Cardiomyopathy-Unspecified I42.9  History:        Patient has prior history of Echocardiogram examinations, most                  recent 02/15/2021. CHF, CAD, Stroke, Arrythmias:Atrial                 Fibrillation; Risk Factors:Current Smoker, Hypertension,                 Dyslipidemia and Diabetes.  Sonographer:    Eulah Pont RDCS Referring Phys: [id number] Lorin Glass IMPRESSIONS  1. Left ventricular ejection fraction, by estimation, is 20 to 25%. The left ventricle has severely decreased function. The left ventricle demonstrates regional wall motion abnormalities (see scoring diagram/findings for description). The left ventricular internal cavity size was mildly to moderately dilated. Left ventricular diastolic function could not be evaluated. There is the interventricular septum is flattened in systole and diastole, consistent with right ventricular pressure and volume overload. There is severe global hypokinesis with relative preservation of the basal and mid segments of the lateral wall.  2. Right ventricular systolic function is severely reduced. The right ventricular size is severely enlarged. There is moderately elevated pulmonary artery systolic pressure. The estimated right ventricular systolic pressure is 46.8 mmHg.  3. Left atrial size was moderately dilated.  4. Right atrial size was moderately dilated.  5. The pericardial effusion is circumferential.  6. The mitral valve is normal in structure. Moderate to severe mitral valve regurgitation. No evidence of mitral stenosis.  7. Tricuspid valve regurgitation is moderate to severe.  8. The aortic valve is tricuspid. There is mild calcification of the aortic valve. Aortic valve regurgitation is not visualized. Mild aortic valve sclerosis is present, with no evidence of aortic valve stenosis.  9. The inferior vena cava is dilated  in size with <50% respiratory variability, suggesting right atrial pressure of 15 mmHg. Comparison(s): Prior images reviewed side by side. Mitral insufficiency was underestimated on the previous study, when it was also probably moderate-severe. Tricuspid  insufficiency has worsened on the current study. FINDINGS  Left Ventricle: Left ventricular ejection fraction, by estimation, is 20 to 25%. The left ventricle has severely decreased function. The left ventricle demonstrates regional wall motion abnormalities. The left ventricular internal cavity size was mildly  to moderately dilated. There is no left ventricular hypertrophy. The interventricular septum is flattened in systole and diastole, consistent with right ventricular pressure and volume overload. Left ventricular diastolic function could not be evaluated. Left ventricular diastolic function could not be evaluated due to atrial fibrillation.  LV Wall Scoring: There is severe global hypokinesis with relative preservation of the basal and mid segments of the lateral wall. Right Ventricle: The right ventricular size is severely enlarged. Right ventricular systolic function is severely reduced. There is moderately elevated pulmonary artery systolic pressure. The tricuspid regurgitant velocity is 2.82 m/s, and with an assumed right atrial pressure of 15 mmHg, the estimated right ventricular systolic pressure is 46.8 mmHg. Left Atrium: Left atrial size was moderately dilated. Right Atrium: Right atrial size was moderately dilated. Pericardium: Trivial pericardial effusion is present. The pericardial effusion is circumferential. Mitral Valve: The mitral valve is normal in structure. Moderate to severe mitral valve regurgitation, with eccentric posteriorly directed jet. No evidence of mitral valve stenosis. Tricuspid Valve: The tricuspid valve is normal in structure. Tricuspid valve regurgitation is moderate to severe. The flow in the hepatic veins is reversed during ventricular systole. Aortic Valve: The aortic valve is tricuspid. There is mild calcification of the aortic valve. Aortic valve regurgitation is not visualized. Mild aortic valve sclerosis is present, with no evidence of aortic valve stenosis. Pulmonic  Valve: The pulmonic valve was normal in structure. Pulmonic valve regurgitation is not visualized. Aorta: The aortic root is normal in size and structure. Venous: The inferior vena cava is dilated in size with less than 50% respiratory variability, suggesting right atrial pressure of 15 mmHg. IAS/Shunts: No atrial level shunt detected by color flow Doppler. Additional Comments: Mild ascites is present. LEFT VENTRICLE PLAX 2D LVIDd:         6.00 cm LVIDs:         5.20 cm LV PW:         0.90 cm LV IVS:        1.00 cm LVOT diam:     2.20 cm LV SV:         40 LV SV Index:   16 LVOT Area:     3.80 cm  LV Volumes (MOD) LV vol d, MOD A4C: 200.0 ml LV vol s, MOD A4C: 153.0 ml LV SV MOD A4C:     200.0 ml RIGHT VENTRICLE TAPSE (M-mode): 1.0 cm LEFT ATRIUM         Index LA diam:    4.60 cm 1.86 cm/m  AORTIC VALVE LVOT Vmax:   77.67 cm/s LVOT Vmean:  50.900 cm/s LVOT VTI:    0.105 m  AORTA Ao Root diam: 3.70 cm MR Peak grad: 67.2 mmHg   TRICUSPID VALVE MR Mean grad: 44.0 mmHg   TR Peak grad:   31.8 mmHg MR Vmax:      410.00 cm/s TR Vmax:        282.00 cm/s MR Vmean:     313.0 cm/s  SHUNTS                           Systemic VTI:  0.10 m                           Systemic Diam: 2.20 cm Mihai Croitoru MD Electronically signed by Thurmon Fair MD Signature Date/Time: 04/02/2021/9:13:44 AM    Final    - pertinent xrays, CT, MRI studies were reviewed and independently interpreted  Positive ROS: All other systems have been reviewed and were otherwise negative with the exception of those mentioned in the HPI and as above.  Physical Exam: General: Alert, no acute distress Psychiatric: Patient is competent for consent with normal mood and affect Lymphatic: No axillary or cervical lymphadenopathy Cardiovascular: No pedal edema Respiratory: No cyanosis, no use of accessory musculature GI: No organomegaly, abdomen is soft and non-tender    Images:  @ENCIMAGES @  Labs:  Lab Results  Component  Value Date   HGBA1C 7.7 (H) 02/18/2021   HGBA1C 7.8 (H) 02/16/2021   HGBA1C (H) 01/15/2010    7.2 (NOTE) The ADA recommends the following therapeutic goal for glycemic control related to Hgb A1c measurement: Goal of therapy: <6.5 Hgb A1c  Reference: American Diabetes Association: Clinical Practice Recommendations 2010, Diabetes Care, 2010, 33: (Suppl  1).   ESRSEDRATE 14 02/18/2021   CRP 13.2 (H) 02/18/2021   LABURIC 5.2 03/03/2021   LABURIC 4.6 02/18/2021   REPTSTATUS PENDING 04/02/2021   CULT  04/02/2021    NO GROWTH < 24 HOURS Performed at [facility], [address] [place], [place]     Lab Results  Component Value Date   ALBUMIN 2.2 (L) 04/03/2021   ALBUMIN 2.6 (L) 04/02/2021   ALBUMIN 2.2 (L) 03/22/2021   PREALBUMIN <5.0 (L) 02/18/2021   LABURIC 5.2 03/03/2021   LABURIC 4.6 02/18/2021     CBC EXTENDED Latest Ref Rng & Units 04/03/2021 04/02/2021 03/22/2021  WBC 4.0 - 10.5 K/uL 12.4(H) 14.5(H) -  RBC 4.22 - 5.81 MIL/uL 3.36(L) 3.64(L) -  HGB 13.0 - 17.0 g/dL 03/24/2021) 3.4(H) 9.6(Q)  HCT 39.0 - 52.0 % 29.6(L) 32.7(L) 30.0(L)  PLT 150 - 400 K/uL 269 289 -  NEUTROABS 1.7 - 7.7 K/uL - - -  LYMPHSABS 0.7 - 4.0 K/uL - - -    Neurologic: Patient does not have protective sensation bilateral lower extremities.   MUSCULOSKELETAL:   Skin: Examination the wound edges are well approximated there is no cellulitis no purulent drainage there is a small amount of resolving hematoma draining.  The wound edges do not have good epithelialization.  Is starting to develop a flexion contracture with stretching he lacks about 10 degrees to full extension.  Assessment: 4 weeks status post left transtibial amputation with developing a flexion contracture and insufficient epithelialization.  Plan: Plan: Patient was given instructions and demonstrated knee extension exercises the stump shrinker was reapplied with it pulled up over his knee.  I will plan for staple removal in a  week or 2.  Thank you for the consult and the opportunity to see Mr. Gardiner Espana, MD [facility] [phone] 11:19 AM

## 2021-04-03 NOTE — Evaluation (Signed)
Clinical/Bedside Swallow Evaluation Patient Details  Name: Fernando Maddox MRN: [id number] Date of Birth: August 14, 1961  Today's Date: 04/03/2021 Time: SLP Start Time (ACUTE ONLY): 1427 SLP Stop Time (ACUTE ONLY): 1438 SLP Time Calculation (min) (ACUTE ONLY): 11 min  Past Medical History:  Past Medical History:  Diagnosis Date  . Acute combined systolic and diastolic CHF, NYHA class 1 (HCC) 08/07/2016  . Acute combined systolic and diastolic congestive heart failure (HCC)   . Acute metabolic encephalopathy 03/18/2021  . Acute on chronic systolic heart failure (HCC) 03/25/2018  . AKI (acute kidney injury) (HCC) 03/18/2021  . Anemia   . Atrial fibrillation (HCC)   . BACK PAIN 02/11/2010   Qualifier: Diagnosis of  By: Manson Passey, RN, BSN, Lauren    . CAD (coronary artery disease) 02/11/2010   Qualifier: Diagnosis of  By: Manson Passey, RN, BSN, Lauren    . Cardiogenic shock (HCC) 04/02/2021  . Cellulitis 09/29/2017  . Cerebrovascular disease   . CHEST PAIN 02/11/2010   Qualifier: Diagnosis of  By: Manson Passey, RN, BSN, Lauren    . Chronic anticoagulation 03/25/2018  . Chronic pain syndrome   . Chronic systolic congestive heart failure (HCC) 03/25/2018  . Community acquired pneumonia 08/11/2016  . Dehiscence of amputation stump (HCC)   . DEPRESSION/ANXIETY 02/11/2010   Qualifier: Diagnosis of  By: Manson Passey, RN, BSN, Lauren    . Diabetes mellitus, type 2 (HCC) 02/11/2010   Qualifier: Diagnosis of  By: Manson Passey, RN, BSN, Lauren    . Diabetic ulcer of toe of right foot associated with type 2 diabetes mellitus (HCC) 10/01/2017  . ESOPHAGEAL STRICTURE 02/11/2010   Qualifier: Diagnosis of  By: Manson Passey, RN, BSN, Lauren    . Foot ulcer (HCC)   . GERD (gastroesophageal reflux disease)   . HEADACHE 02/11/2010   Qualifier: Diagnosis of  By: Manson Passey, RN, BSN, Lauren    . History of CVA (cerebrovascular accident) 09/29/2017  . History of drug abuse (HCC) 08/05/2016  . Hypercholesteremia   . Hyperlipemia 02/11/2010   Qualifier: Diagnosis of   By: Manson Passey, RN, BSN, Lauren    . Hyperlipidemia   . Hypertension 02/11/2010   Qualifier: Diagnosis of  By: Manson Passey, RN, BSN, Lauren    . HYPERTENSION, UNSPECIFIED 02/11/2010   Qualifier: Diagnosis of  By: Manson Passey, RN, BSN, Lauren    . Hypogonadism male   . Hyponatremia   . Lactic acidosis   . 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 Status post thrombectomy  . Normocytic anemia   . Osteoarthritis 08/05/2016  . Osteomyelitis, unspecified (HCC) 10/06/2017  . PAF (paroxysmal atrial fibrillation) (HCC) 08/05/2016  . Polysubstance abuse (HCC) 09/29/2017  . Rotator cuff syndrome    Shoulder  . Septic shock (HCC) 02/14/2021  . Shock (HCC) 04/02/2021  . Shoulder pain   . 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 Sleep apnea   . Stroke (HCC) 08/05/2016  . Tobacco use 08/11/2016  . Transaminitis 03/18/2021  . Ulcer of left foot with necrosis of bone [facility])    Past Surgical History:  Past Surgical History:  Procedure Laterality Date  . AMPUTATION Left 02/20/2021   Procedure: LEFT TRANSMETATARSAL AMPUTATION;  Surgeon: [name], MD;  Location: [facility] OR;  Service: Orthopedics;  Laterality: Left;  . AMPUTATION Left 03/06/2021   Procedure: LEFT BELOW KNEE AMPUTATION;  Surgeon: [name], MD;  Location: [facility] OR;  Service: Orthopedics;  Laterality: Left;  . NO  PAST SURGERIES     HPI:  60 y.o. male  admitted on 04/02/2021  with history of CAD, chronic Afib, chronic systolic CHF presents with failure to thrive, confusion, and anorexia. 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 Has severe CHF with biventricular failure. EF 20-25%. On recent admission required midodrine for hypotension. Was treated with diuretics and Toprol XL for rate control. Has been on chronic Xarelto. There has been concern about patient compliance in the past but he  has been in a SNF since last discharge. He has a standing DNR order. patient noted to be severely acidotic and has diffuse anasarca. CXR concerning for RLL PNA. He has worsening renal function. Anemic with Hgb 9.7. Repeat Echo showed EF 20-25% with biventricular failure and mod to severe MR.   Assessment / Plan / Recommendation Clinical Impression  Pts swallow appears within functional limits, consistent with recent objective swallow study workup 03/19/21: MBSS with within functional limits. Pt with hx of impulsivity with POs likely leading to past events of episodic difficulty. No overt s/sx of aspiration exhibited with any PO this date. Recommend regular and thin liquid diet with supervision at meals from staff to assist with safe swallowing strategies. No further ST needs identified.  SLP Visit Diagnosis: Dysphagia, unspecified (R13.10)    Aspiration Risk  Mild aspiration risk    Diet Recommendation   Regular thin liquids   Medication Administration: Whole meds with liquid    Other  Recommendations Oral Care Recommendations: Oral care BID   Follow up Recommendations None      Frequency and Duration  n/a          Prognosis        Swallow Study   General Date of Onset: 04/02/21 HPI: 60 y.o. male  admitted on 04/02/2021  with history of CAD, chronic Afib, chronic systolic CHF presents with failure to thrive, confusion, and anorexia. 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 Has severe CHF with biventricular failure. EF 20-25%. On recent admission required midodrine for hypotension. Was treated with diuretics and Toprol XL for rate control. Has been on chronic Xarelto. There has been concern about patient compliance in the past but he has been in a SNF since last discharge. He has a standing DNR order. patient noted to be severely acidotic and has diffuse anasarca. CXR concerning for RLL PNA. He has worsening renal function.  Anemic with Hgb 9.7. Repeat Echo showed EF 20-25% with biventricular failure and mod to severe MR. Type of Study: Bedside Swallow Evaluation Previous Swallow Assessment: MBSS 03/19/21 WFL Diet Prior to this Study: NPO Temperature Spikes Noted: No Respiratory Status: Nasal cannula History of Recent Intubation: No Behavior/Cognition: Alert;Cooperative;Impulsive Oral Cavity Assessment: Within Functional Limits Oral Care Completed by SLP: No Oral Cavity - Dentition: Adequate natural dentition;Poor condition Vision: Functional for self-feeding Self-Feeding Abilities: Able to feed self Patient Positioning: Upright in bed Baseline Vocal Quality: Normal Volitional Cough: Strong Volitional Swallow: Able to elicit    Oral/Motor/Sensory Function Overall Oral Motor/Sensory Function: Within functional limits   Ice Chips Ice chips: Not tested   Thin Liquid Thin Liquid: Within functional limits Presentation: Cup;Straw    Nectar Thick Nectar Thick Liquid: Not tested   Honey Thick Honey Thick Liquid: Not tested   Puree Puree: Within functional limits   Solid     Solid: Within functional limits      Calena Salem H. MA, CCC-SLP Acute Rehabilitation  Services   04/03/2021,2:47 PM

## 2021-04-03 NOTE — Progress Notes (Signed)
ANTICOAGULATION CONSULT NOTE  Pharmacy Consult for: Heparin Indication: atrial fibrillation  Allergies  Allergen Reactions  . Codeine Itching and Other (See Comments)    "Allergic," per paperwork from facility  . Bee Venom Other (See Comments)    "Feels like his head is on fire when he goes outside in the sun" and "Allergic," per paperwork from facility  . Hydrocodone-Acetaminophen Itching and Other (See Comments)    "Allergic," per paperwork from facility  . Cephalexin Other (See Comments)    No mention of this allergy in notes from 07/2016 admission for osteomyelitis.  Patient was treated with Ancef and Unasyn for broader anaerobic coverage.  Patient is not aware of allergy. -08/02/2020  On 04/02/2021: "Allergic," per paperwork from facility  . Vicodin [Hydrocodone-Acetaminophen] Itching and Other (See Comments)    "Allergic," per paperwork from facility  . Zocor [Simvastatin] Other (See Comments)    Myalgias and "Allergic," per paperwork from facility    Patient Measurements: Height: 6\' 3"  (190.5 cm) Weight: 120.3 kg (265 lb 3.4 oz) IBW/kg (Calculated) : 84.5 Heparin Dosing Weight: 110 kg  Vital Signs: Temp: 96.8 F (36 C) (06/08 0011) Temp Source: Axillary (06/08 0011) BP: 93/57 (06/08 0000) Pulse Rate: 101 (06/08 0000)  Labs: Recent Labs    04/02/21 0500 04/02/21 0928 04/02/21 0947 04/02/21 2002 04/02/21 2241  HGB  --  9.7*  --   --   --   HCT  --  32.7*  --   --   --   PLT  --  289  --   --   --   APTT  --   --   --  >200* >200*  CREATININE 1.55*  --  1.81* 1.64*  --     Estimated Creatinine Clearance: 67.8 mL/min (A) (by C-G formula based on SCr of 1.64 mg/dL (H)).  Assessment: 61 y.o. male with h/o Afib, Xarelto on hold, for heparin  Goal of Therapy:  aPTT 66-102 seconds Monitor platelets by anticoagulation protocol: Yes   Plan:  Hold heparin x 90 min, then decrease heparin 1200 units/hr APTT in 8 hours  46, PharmD, BCPS

## 2021-04-03 NOTE — Progress Notes (Signed)
This chaplain responded to the PMT referral for creating the Pt. HCPOA.  The Pt. completed HCPOA education with the chaplain.    The chaplain is present with the Pt., Pt. parents-[name] and Rama, along with the notary and two witnesses for notarizing the Pt. HCPOA.  The Pt. named Rama Pottinger as his healthcare agent. If this person is unable or unwilling to serve as his healthcare agent, the Pt. next choice is [employer].  The chaplain gave the Pt. the original HCPOA along with two copies.  A copy was scanned into the Pt. EMR.

## 2021-04-03 NOTE — Progress Notes (Signed)
INR 6.2, on xarelto rather than warfarin PTA. Likely this is from liver failure more than PTA xarelto. Will give vit K & FFP x 2. Recheck INR this afternoon. Discussed with pharmacy. Heparin discontinued.  Steffanie Dunn, DO 04/03/21 1:01 PM [facility]

## 2021-04-03 NOTE — Progress Notes (Signed)
eLink Physician-Brief Progress Note Patient Name: Fernando Maddox DOB: 21-Aug-1961 MRN: [id number]   Date of Service  04/03/2021  HPI/Events of Note  Patient with markedly elevated aPTT (> 200).  eICU Interventions  Heparin infusion held, repeat aPTT ordered.        [name] U Quincey Quesinberry 04/03/2021, 1:01 AM

## 2021-04-03 NOTE — Progress Notes (Signed)
This chaplain responded to PMT consult for spiritual care and creating/updating the Pt. HCPOA.  This chaplain understands after reading the chart notes and communicating with the PMT, the Pt. remains unable to complete a HCPOA at this time.  This chaplain will F/U with spiritual care as needed.

## 2021-04-03 NOTE — Progress Notes (Signed)
Blood bank notified me FFP is ready for pick up. The patient does not have a blood bank bracelet, was told by blood bank no type and screen or blood bank bracelet is needed for FFP.  Judeth Horn, RN

## 2021-04-03 NOTE — Progress Notes (Signed)
    Progress Note from the Palliative Medicine Team at [facility]   Patient Name: Fernando Maddox        Date: 04/03/2021 DOB: [birth year]  Age: 60 y.o. MRN#: [id number] Attending Physician: Steffanie Dunn, DO Primary Care Physician: Sherron Monday, MD Admit Date: 04/02/2021   Medical records reviewed   60 y.o. male  admitted on 04/02/2021  with history of CAD, chronic Afib, chronic systolic CHF presents with failure to thrive, confusion, and anorexia. 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 Has severe CHF with biventricular failure. EF 20-25%. On recent admission required midodrine for hypotension. Was treated with diuretics and Toprol XL for rate control. Has been on chronic Xarelto. There has been concern about patient compliance in the past but he has been in a SNF since last discharge. He has a standing DNR order.  On Admission patient noted to be severely acidotic and has diffuse anasarca. CXR concerning for RLL PNA. He has worsening renal function. Anemic with Hgb 9.7. Repeat Echo showed EF 20-25% with biventricular failure and mod to severe MR.   Currently admitted to cardiac intensive care.   This NP visited patient at the bedside as a follow up to  yesterday's GOCs meeting.  Patient is alert today and engaged in conversation.  Parents at bedside  Plan of Care; -DNR/DNI - no artificial feeding now or in the future, asking to advance oral intake -Speech evaluation for diet recommendation -Continue to treat the treatable, hopeful for improvement and stabilization -Transition back to skilled nursing facility for ongoing rehabilitation -Family hopes to secure H POA with the assistance of spiritual care -Recommend outpatient community-based palliative on discharge       Discussed with patient the importance of continued conversation with his  family and their  medical providers regarding overall plan of care and  treatment options,  ensuring decisions are within the context of the patients values and GOCs.  Questions and concerns addressed   Discussed with bedside RN  This nurse practitioner informed  the patient/family  that I will be out of the hospital until Monday morning.  If the patient is still hospitalized I will follow-up at that time.  Call palliative medicine team phone # [phone] with questions or concerns in the interim   Total time spent on the unit was 35 minutes  Greater than 50% of the time was spent in counseling and coordination of care  Lorinda Creed NP  Palliative Medicine Team Team Phone # [phone] Pager [phone]

## 2021-04-03 NOTE — Progress Notes (Signed)
Date and time results received: 04/03/21 1130 (use smartphrase ".now" to insert current time)  Test: INR Critical Value: 6.2  Name of Provider Notified: Karie Fetch, DO  Orders Received? Or Actions Taken?: Discontinue Heparin, placing orders for Vit K.  Judeth Horn, RN

## 2021-04-03 NOTE — Progress Notes (Signed)
Progress Note  Patient Name: Fernando Maddox Date of Encounter: 04/03/2021  [facility] Cardiologist: Garwin Brothers, MD   Subjective   Somnolent. Opens eyes to commands but no verbal response to questions. More alert than yesterday.   Inpatient Medications    Scheduled Meds: . Chlorhexidine Gluconate Cloth  6 each Topical Daily  . furosemide  60 mg Intravenous Q6H  . insulin aspart  0-15 Units Subcutaneous TID WC  . mouth rinse  15 mL Mouth Rinse BID  . potassium chloride  40 mEq Oral BID  . sodium chloride flush  10-40 mL Intracatheter Q12H  . sodium chloride flush  3 mL Intravenous Q12H   Continuous Infusions: . sodium chloride    . azithromycin Stopped (04/02/21 1322)  . ceFEPime (MAXIPIME) IV Stopped (04/03/21 0431)  . heparin 1,200 Units/hr (04/03/21 0600)  . milrinone 0.25 mcg/kg/min (04/03/21 0813)  . sodium bicarbonate 150 mEq in D5W infusion 125 mL/hr at 04/03/21 0600  . vancomycin 1,250 mg (04/03/21 0820)   PRN Meds: sodium chloride, docusate sodium, polyethylene glycol, sodium chloride flush, sodium chloride flush   Vital Signs    Vitals:   04/03/21 0530 04/03/21 0545 04/03/21 0600 04/03/21 0753  BP: (!) 144/105  97/61   Pulse:  (!) 114 (!) 101   Resp: 16 14 11    Temp:    (!) 96.5 F (35.8 C)  TempSrc:    Axillary  SpO2:  97% 99%   Weight:   117.5 kg   Height:        Intake/Output Summary (Last 24 hours) at 04/03/2021 0852 Last data filed at 04/03/2021 0700 Gross per 24 hour  Intake 3161.8 ml  Output 7150 ml  Net -3988.2 ml   Last 3 Weights 04/03/2021 04/02/2021 03/19/2021  Weight (lbs) 259 lb 0.7 oz 265 lb 3.4 oz 265 lb 3.4 oz  Weight (kg) 117.5 kg 120.3 kg 120.3 kg      Telemetry    Afib with controlled rate 100-110  - Personally Reviewed  ECG    None today - Personally Reviewed  Physical Exam   GEN: No acute distress.   Neck: No JVD, central line in place.  Cardiac: IRRR, no murmurs, rubs, or gallops.  Respiratory: Clear to  auscultation bilaterally. GI: Soft, nontender, non-distended  Ext: 3+ diffuse edema/anasarca Neuro:  Nonfocal  Psych: Normal affect   Labs    High Sensitivity Troponin:   Recent Labs  Lab 03/15/21 0557 03/15/21 0730  TROPONINIHS 27* 25*      Chemistry Recent Labs  Lab 04/02/21 0500 04/02/21 0947 04/02/21 2002 04/03/21 0434  NA 128* 132* 133* 133*  K SPECIMEN HEMOLYZED. HEMOLYSIS MAY AFFECT INTEGRITY OF RESULTS. 5.5* 4.6 3.8  CL 92* 96* 94* 93*  CO2 11* 20* 29 29  GLUCOSE 319* 189* 144* 172*  BUN 33* 40* 42* 43*  CREATININE 1.55* 1.81* 1.64* 1.56*  CALCIUM 8.9 8.5* 8.3* 8.1*  PROT 6.2*  --   --  5.5*  ALBUMIN 2.6*  --   --  2.2*  AST 549*  --   --  297*  ALT 133*  --   --  142*  ALKPHOS 92  --   --  78  BILITOT 5.4*  --   --  3.5*  GFRNONAA 51* 43* 48* 51*  ANIONGAP 25* 16* 10 11     Hematology Recent Labs  Lab 04/02/21 0928 04/03/21 0434  WBC 14.5* 12.4*  RBC 3.64* 3.36*  HGB 9.7* 9.2*  HCT 32.7* 29.6*  MCV 89.8 88.1  MCH 26.6 27.4  MCHC 29.7* 31.1  RDW 19.4* 19.3*  PLT 289 269    BNP Recent Labs  Lab 04/02/21 0346  BNP 1,993.9*     DDimer No results for input(s): DDIMER in the last 168 hours.   Radiology    CT Head Wo Contrast  Result Date: 04/02/2021 CLINICAL DATA:  Mental status change with unknown cause EXAM: CT HEAD WITHOUT CONTRAST TECHNIQUE: Contiguous axial images were obtained from the base of the skull through the vertex without intravenous contrast. COMPARISON:  03/15/2021 FINDINGS: Brain: Large remote right MCA territory infarct with dense encephalomalacia throughout the territory. No evidence of acute infarct, hemorrhage, hydrocephalus, or mass. Vascular: No hyperdense vessel or unexpected calcification. Skull: Normal. Negative for fracture or focal lesion. Sinuses/Orbits: Negative Other: Moderately motion degraded. IMPRESSION: 1. Motion degraded study without acute finding. 2. Remote MCA territory infarct. Electronically Signed   By:  Marnee Spring M.D.   On: 04/02/2021 07:23   CT CHEST WO CONTRAST  Result Date: 04/02/2021 CLINICAL DATA:  Aspiration.  Abnormal chest x-ray EXAM: CT CHEST WITHOUT CONTRAST TECHNIQUE: Multidetector CT imaging of the chest was performed following the standard protocol without IV contrast. COMPARISON:  02/03/2021 FINDINGS: Cardiovascular: Cardiomegaly. No pericardial effusion. Extensive atheromatous calcification of the coronaries. No acute vascular finding without contrast Mediastinum/Nodes: No adenopathy or air leak Lungs/Pleura: Multifocal airspace disease affecting all lobes and most confluent at the right upper lobe. Small left and more moderate right layering pleural fluid that appears low-density. No septal thickening. No air leak Upper Abdomen: Negative Musculoskeletal: Spondylosis. Remote bilateral rib fractures. No acute finding. Other: Motion artifact. IMPRESSION: 1. Multifocal pneumonia. 2. Layering pleural effusions, moderate on the right and small on the left. 3. Cardiomegaly and extensive coronary atherosclerosis. Electronically Signed   By: Marnee Spring M.D.   On: 04/02/2021 07:25   DG CHEST PORT 1 VIEW  Result Date: 04/02/2021 CLINICAL DATA:  Central catheter placement EXAM: PORTABLE CHEST 1 VIEW COMPARISON:  April 02, 2021 chest radiograph obtained earlier in the day as well as April 02, 2021 chest CT. FINDINGS: Central catheter tip is in the superior vena cava. No pneumothorax. Airspace opacity in the right upper lobe consistent with pneumonia persists. There is ill-defined opacity in portions of each lung base, also likely foci of pneumonia. There is cardiac enlargement with pulmonary vascularity normal. No adenopathy. No bone lesions. IMPRESSION: Central catheter tip in superior vena cava without pneumothorax. Extensive airspace opacity consistent with pneumonia right lobe with ill-defined areas of bibasilar opacity, likely foci of pneumonia as well. Stable cardiomegaly. Electronically  Signed   By: Bretta Bang III M.D.   On: 04/02/2021 10:21   DG Chest Port 1 View  Result Date: 04/02/2021 CLINICAL DATA:  Edema. EXAM: PORTABLE CHEST 1 VIEW COMPARISON:  03/16/2021. FINDINGS: Interval removal of left IJ line. Cardiomegaly. Bilateral multifocal infiltrates/edema, right side greater than left. Small bilateral pleural effusions cannot be excluded. No pneumothorax. Old left rib fractures noted. IMPRESSION: 1.  Interval removal of left IJ line.  No pneumothorax. 2.  Cardiomegaly. 3. Bilateral multifocal infiltrates/edema, right side greater than left. CHF and/or multifocal pneumonia could present this fashion. Small bilateral pleural effusions. Electronically Signed   By: Maisie Fus  Register   On: 04/02/2021 05:18   ECHOCARDIOGRAM LIMITED  Result Date: 04/02/2021    ECHOCARDIOGRAM LIMITED REPORT   Patient Name:   EVEN BUDLONG Date of Exam: 04/02/2021 Medical Rec #:  [id number]  Height:       75.0 in Accession #:    [id number]    Weight:       265.2 lb Date of Birth:  December 07, 1960     BSA:          2.474 m Patient Age:    59 years      BP:           103/87 mmHg Patient Gender: M             HR:           103 bpm. Exam Location:  Inpatient Procedure: Limited Echo, Limited Color Doppler and Cardiac Doppler                     STAT ECHO Reported to: Dr. Royann Shivers on 04/02/2021 8:38:00 AM. Indications:    Cardiomyopathy-Unspecified I42.9  History:        Patient has prior history of Echocardiogram examinations, most                 recent 02/15/2021. CHF, CAD, Stroke, Arrythmias:Atrial                 Fibrillation; Risk Factors:Current Smoker, Hypertension,                 Dyslipidemia and Diabetes.  Sonographer:    Eulah Pont RDCS Referring Phys: [id number] Lorin Glass IMPRESSIONS  1. Left ventricular ejection fraction, by estimation, is 20 to 25%. The left ventricle has severely decreased function. The left ventricle demonstrates regional wall motion abnormalities (see scoring diagram/findings for  description). The left ventricular internal cavity size was mildly to moderately dilated. Left ventricular diastolic function could not be evaluated. There is the interventricular septum is flattened in systole and diastole, consistent with right ventricular pressure and volume overload. There is severe global hypokinesis with relative preservation of the basal and mid segments of the lateral wall.  2. Right ventricular systolic function is severely reduced. The right ventricular size is severely enlarged. There is moderately elevated pulmonary artery systolic pressure. The estimated right ventricular systolic pressure is 46.8 mmHg.  3. Left atrial size was moderately dilated.  4. Right atrial size was moderately dilated.  5. The pericardial effusion is circumferential.  6. The mitral valve is normal in structure. Moderate to severe mitral valve regurgitation. No evidence of mitral stenosis.  7. Tricuspid valve regurgitation is moderate to severe.  8. The aortic valve is tricuspid. There is mild calcification of the aortic valve. Aortic valve regurgitation is not visualized. Mild aortic valve sclerosis is present, with no evidence of aortic valve stenosis.  9. The inferior vena cava is dilated in size with <50% respiratory variability, suggesting right atrial pressure of 15 mmHg. Comparison(s): Prior images reviewed side by side. Mitral insufficiency was underestimated on the previous study, when it was also probably moderate-severe. Tricuspid insufficiency has worsened on the current study. FINDINGS  Left Ventricle: Left ventricular ejection fraction, by estimation, is 20 to 25%. The left ventricle has severely decreased function. The left ventricle demonstrates regional wall motion abnormalities. The left ventricular internal cavity size was mildly  to moderately dilated. There is no left ventricular hypertrophy. The interventricular septum is flattened in systole and diastole, consistent with right ventricular  pressure and volume overload. Left ventricular diastolic function could not be evaluated. Left ventricular diastolic function could not be evaluated due to atrial fibrillation.  LV Wall Scoring: There is severe global hypokinesis with relative preservation of  the basal and mid segments of the lateral wall. Right Ventricle: The right ventricular size is severely enlarged. Right ventricular systolic function is severely reduced. There is moderately elevated pulmonary artery systolic pressure. The tricuspid regurgitant velocity is 2.82 m/s, and with an assumed right atrial pressure of 15 mmHg, the estimated right ventricular systolic pressure is 46.8 mmHg. Left Atrium: Left atrial size was moderately dilated. Right Atrium: Right atrial size was moderately dilated. Pericardium: Trivial pericardial effusion is present. The pericardial effusion is circumferential. Mitral Valve: The mitral valve is normal in structure. Moderate to severe mitral valve regurgitation, with eccentric posteriorly directed jet. No evidence of mitral valve stenosis. Tricuspid Valve: The tricuspid valve is normal in structure. Tricuspid valve regurgitation is moderate to severe. The flow in the hepatic veins is reversed during ventricular systole. Aortic Valve: The aortic valve is tricuspid. There is mild calcification of the aortic valve. Aortic valve regurgitation is not visualized. Mild aortic valve sclerosis is present, with no evidence of aortic valve stenosis. Pulmonic Valve: The pulmonic valve was normal in structure. Pulmonic valve regurgitation is not visualized. Aorta: The aortic root is normal in size and structure. Venous: The inferior vena cava is dilated in size with less than 50% respiratory variability, suggesting right atrial pressure of 15 mmHg. IAS/Shunts: No atrial level shunt detected by color flow Doppler. Additional Comments: Mild ascites is present. LEFT VENTRICLE PLAX 2D LVIDd:         6.00 cm LVIDs:         5.20 cm LV PW:          0.90 cm LV IVS:        1.00 cm LVOT diam:     2.20 cm LV SV:         40 LV SV Index:   16 LVOT Area:     3.80 cm  LV Volumes (MOD) LV vol d, MOD A4C: 200.0 ml LV vol s, MOD A4C: 153.0 ml LV SV MOD A4C:     200.0 ml RIGHT VENTRICLE TAPSE (M-mode): 1.0 cm LEFT ATRIUM         Index LA diam:    4.60 cm 1.86 cm/m  AORTIC VALVE LVOT Vmax:   77.67 cm/s LVOT Vmean:  50.900 cm/s LVOT VTI:    0.105 m  AORTA Ao Root diam: 3.70 cm MR Peak grad: 67.2 mmHg   TRICUSPID VALVE MR Mean grad: 44.0 mmHg   TR Peak grad:   31.8 mmHg MR Vmax:      410.00 cm/s TR Vmax:        282.00 cm/s MR Vmean:     313.0 cm/s                           SHUNTS                           Systemic VTI:  0.10 m                           Systemic Diam: 2.20 cm Thurmon Fair MD Electronically signed by Thurmon Fair MD Signature Date/Time: 04/02/2021/9:13:44 AM    Final     Cardiac Studies   Limited echo 04/02/2021 1. Left ventricular ejection fraction, by estimation, is 20 to 25%. The  left ventricle has severely decreased function. The left ventricle  demonstrates regional wall motion abnormalities (see scoring  diagram/findings  for description). The left  ventricular internal cavity size was mildly to moderately dilated. Left  ventricular diastolic function could not be evaluated. There is the  interventricular septum is flattened in systole and diastole, consistent  with right ventricular pressure and  volume overload. There is severe global hypokinesis with relative  preservation of the basal and mid segments of the lateral wall.  2. Right ventricular systolic function is severely reduced. The right  ventricular size is severely enlarged. There is moderately elevated  pulmonary artery systolic pressure. The estimated right ventricular  systolic pressure is 46.8 mmHg.  3. Left atrial size was moderately dilated.  4. Right atrial size was moderately dilated.  5. The pericardial effusion is circumferential.  6. The mitral  valve is normal in structure. Moderate to severe mitral  valve regurgitation. No evidence of mitral stenosis.  7. Tricuspid valve regurgitation is moderate to severe.  8. The aortic valve is tricuspid. There is mild calcification of the  aortic valve. Aortic valve regurgitation is not visualized. Mild aortic  valve sclerosis is present, with no evidence of aortic valve stenosis.  9. The inferior vena cava is dilated in size with <50% respiratory  variability, suggesting right atrial pressure of 15 mmHg.   Comparison(s): Prior images reviewed side by side. Mitral insufficiency  was underestimated on the previous study, when it was also probably  moderate-severe. Tricuspid insufficiency has worsened on the current  study.    Patient Profile     60 y.o. male with a hx of CAD, chronic atrial fibrillation, history of CVA, OSA on CPAP, HTN, HLD, DM2, tobacco abuse, combined systolic and diastolic heart failure, recent left BKA secondary to osteomyelitis, and history of polysubstance abuse who is being seen 04/02/2021 for the evaluation of cardiogenic vs septic shock at the request of Dr. Everardo AllEllison.  Assessment & Plan    1. Acute septic shock. On broad spectrum antibiotics per CCM. BC negative to date. Severely acidotic. Acidosis improving with lactate 11>>5.5. Potential sources include PNA, urinary source or infected stump.  2. Acute on chronic systolic CHF with cardiogenic shock. EF 20-25% with biventricular failure. CVP initially elevated to 23 mm Hg. Patient has diffuse anasarca with elevated.Co- ox looks better than anticipated at 66 initially. now 69% on milrinone.Marland Kitchen. BNP elevated 1,994. Now on IV milrinone. He is not hypotensive. Afib rate is controlled.Diruesing well with IV lasix - I/O negative 3.5 liters. Weight coming down nicely. Renal function improved. Will continue to hold beta blocker for now. Add digoxin for rate control and CHF.  He is not a candidate for advanced CHF therapies such as  LV assist device.   3. Acute kidney injury. Creatinine 0>9>> 1.8. now improved to 1.5.    4. CAD. Per history. ? Prior stent. No acute ischemic changes on Ecg - troponin negative.  5. S/p left BKA  6. OSA on CPAP in past  7. HLD.  8. Afib chronic rate currently controlled. On chronic anticoagulation. Now on heparin since really unable to take POs.       The patient is critically ill with multiple organ systems failure and requires high complexity decision making for assessment and support, frequent evaluation and titration of therapies, application of advanced monitoring technologies and extensive interpretation of multiple databases.   For questions or updates, please contact [facility] Please consult [email or web address] for contact info under        Signed, Jayln Branscom [place], MD  04/03/2021, 8:52 AM

## 2021-04-03 NOTE — Consult Note (Addendum)
WOC Nurse Consult Note: Reason for Consult: Consult requested for left BKA stump.  Pt had surgery performed on 5/11 by Dr Lajoyce Corners of the ortho team, according to progress notes.  Pt states he missed his post-op appointment. Wound type: Left stump with staples intact and well-approximated.  Small amt old dried blood in between.   Dressing procedure/placement/frequency: Recommend consult to Dr Lajoyce Corners for wound assessment and staple removal since this is a post-op wound; secure chat message sent to the primary team to inform them of this request.  Topical treatment orders provided for bedside nurses to perform as follows: Apply nonadherent dressings over staple site Q day, then cover with stump shrinker. Please re-consult if further assistance is needed.  Thank-you,  Cammie Mcgee MSN, RN, CWOCN, [place], CNS [phone]

## 2021-04-03 NOTE — Progress Notes (Signed)
Patient ID: Fernando Maddox, male   DOB: 27-Mar-1961, 60 y.o.   MRN: [id number] Patient is seen for his first postoperative visit 4 weeks status post left below the knee amputation.  Patient has a small amount of resolving hematoma drainage the wound edges are well approximated there is no cellulitis no signs of infection.  The wound does not have good epithelization yet, we will leave the staples in place.  The stump shrinker was reapplied this needs to be pulled up above his knee.  Patient currently lacks about 10 degrees of extension and he was given instructions to work daily on knee extension.

## 2021-04-03 NOTE — Plan of Care (Signed)

## 2021-04-04 DIAGNOSIS — I4821 Permanent atrial fibrillation: Secondary | ICD-10-CM

## 2021-04-04 DIAGNOSIS — E440 Moderate protein-calorie malnutrition: Secondary | ICD-10-CM | POA: Insufficient documentation

## 2021-04-04 DIAGNOSIS — I5023 Acute on chronic systolic (congestive) heart failure: Secondary | ICD-10-CM

## 2021-04-04 DIAGNOSIS — Z7901 Long term (current) use of anticoagulants: Secondary | ICD-10-CM

## 2021-04-04 LAB — COMPREHENSIVE METABOLIC PANEL
ALT: 124 U/L — ABNORMAL HIGH (ref 0–44)
AST: 162 U/L — ABNORMAL HIGH (ref 15–41)
Albumin: 2.3 g/dL — ABNORMAL LOW (ref 3.5–5.0)
Alkaline Phosphatase: 108 U/L (ref 38–126)
Anion gap: 9 (ref 5–15)
BUN: 35 mg/dL — ABNORMAL HIGH (ref 6–20)
CO2: 39 mmol/L — ABNORMAL HIGH (ref 22–32)
Calcium: 7.9 mg/dL — ABNORMAL LOW (ref 8.9–10.3)
Chloride: 86 mmol/L — ABNORMAL LOW (ref 98–111)
Creatinine, Ser: 1.45 mg/dL — ABNORMAL HIGH (ref 0.61–1.24)
GFR, Estimated: 56 mL/min — ABNORMAL LOW (ref >60)
Glucose, Bld: 149 mg/dL — ABNORMAL HIGH (ref 70–99)
Potassium: 3.5 mmol/L (ref 3.5–5.1)
Sodium: 134 mmol/L — ABNORMAL LOW (ref 135–145)
Total Bilirubin: 2.8 mg/dL — ABNORMAL HIGH (ref 0.3–1.2)
Total Protein: 5.8 g/dL — ABNORMAL LOW (ref 6.5–8.1)

## 2021-04-04 LAB — PREPARE FRESH FROZEN PLASMA

## 2021-04-04 LAB — COOXEMETRY PANEL
Carboxyhemoglobin: 1.8 % — ABNORMAL HIGH (ref 0.5–1.5)
Methemoglobin: 1.2 % (ref 0.0–1.5)
O2 Saturation: 51.9 %
Total hemoglobin: 9.3 g/dL — ABNORMAL LOW (ref 12.0–16.0)

## 2021-04-04 LAB — BPAM FFP
Blood Product Expiration Date: 202206102359
Blood Product Expiration Date: 202206122359
ISSUE DATE / TIME: 202206081317
ISSUE DATE / TIME: 202206081507
Unit Type and Rh: 6200
Unit Type and Rh: 6200

## 2021-04-04 LAB — CBC
HCT: 29.6 % — ABNORMAL LOW (ref 39.0–52.0)
Hemoglobin: 9.2 g/dL — ABNORMAL LOW (ref 13.0–17.0)
MCH: 27.1 pg (ref 26.0–34.0)
MCHC: 31.1 g/dL (ref 30.0–36.0)
MCV: 87.3 fL (ref 80.0–100.0)
Platelets: 261 10*3/uL (ref 150–400)
RBC: 3.39 MIL/uL — ABNORMAL LOW (ref 4.22–5.81)
RDW: 19.1 % — ABNORMAL HIGH (ref 11.5–15.5)
WBC: 9.3 10*3/uL (ref 4.0–10.5)
nRBC: 0 % (ref 0.0–0.2)

## 2021-04-04 LAB — GLUCOSE, CAPILLARY
Glucose-Capillary: 161 mg/dL — ABNORMAL HIGH (ref 70–99)
Glucose-Capillary: 163 mg/dL — ABNORMAL HIGH (ref 70–99)
Glucose-Capillary: 151 mg/dL — ABNORMAL HIGH (ref 70–99)
Glucose-Capillary: 136 mg/dL — ABNORMAL HIGH (ref 70–99)
Glucose-Capillary: 192 mg/dL — ABNORMAL HIGH (ref 70–99)

## 2021-04-04 LAB — PROTIME-INR
INR: 2.3 — ABNORMAL HIGH (ref 0.8–1.2)
Prothrombin Time: 25.5 seconds — ABNORMAL HIGH (ref 11.4–15.2)

## 2021-04-04 LAB — PROCALCITONIN: Procalcitonin: 1.57 ng/mL

## 2021-04-04 LAB — MAGNESIUM: Magnesium: 1.7 mg/dL (ref 1.7–2.4)

## 2021-04-04 MED ORDER — DIGOXIN 125 MCG PO TABS
0.1250 mg | ORAL_TABLET | Freq: Every day | ORAL | Status: DC
  Administered 2021-04-04 – 2021-04-05 (×2): 0.125 mg via ORAL
  Filled 2021-04-04 (×2): qty 1

## 2021-04-04 MED ORDER — FUROSEMIDE 10 MG/ML IJ SOLN
60.0000 mg | Freq: Three times a day (TID) | INTRAMUSCULAR | Status: DC
  Administered 2021-04-04 – 2021-04-05 (×5): 60 mg via INTRAVENOUS
  Filled 2021-04-04 (×5): qty 6

## 2021-04-04 MED ORDER — MAGNESIUM SULFATE 2 GM/50ML IV SOLN
2.0000 g | Freq: Once | INTRAVENOUS | Status: AC
  Administered 2021-04-04: 2 g via INTRAVENOUS
  Filled 2021-04-04: qty 50

## 2021-04-04 MED ORDER — FENTANYL CITRATE (PF) 100 MCG/2ML IJ SOLN: 25.0000 ug | INTRAMUSCULAR | Status: DC | PRN

## 2021-04-04 MED ORDER — ACETAMINOPHEN 500 MG PO TABS
500.0000 mg | ORAL_TABLET | Freq: Four times a day (QID) | ORAL | Status: DC | PRN
  Administered 2021-04-04: 500 mg via ORAL
  Filled 2021-04-04: qty 1

## 2021-04-04 MED ORDER — OXYCODONE HCL 5 MG PO TABS
5.0000 mg | ORAL_TABLET | ORAL | Status: DC | PRN
  Administered 2021-04-04 – 2021-04-05 (×5): 5 mg via ORAL
  Filled 2021-04-04 (×5): qty 1

## 2021-04-04 MED ORDER — POTASSIUM CHLORIDE CRYS ER 20 MEQ PO TBCR
40.0000 meq | EXTENDED_RELEASE_TABLET | Freq: Once | ORAL | Status: AC
  Administered 2021-04-04: 40 meq via ORAL
  Filled 2021-04-04: qty 2

## 2021-04-04 NOTE — Progress Notes (Signed)
K+ 3.5, Mg 1.7 Replaced per protocol

## 2021-04-04 NOTE — Progress Notes (Signed)
NAME:  Fernando Maddox, MRN:  [id number], DOB:  11-Sep-1961, LOS: 2 ADMISSION DATE:  04/02/2021, CONSULTATION DATE:  04/02/21 REFERRING MD:  ER, CHIEF COMPLAINT:  FTT   History of Present Illness:  60 year old man with recent admission for likely cardiogenic shock presenting from SNF with FTT, anorexia, developing confusion.  Found to be hypoglycemic with lactate > 11.  PCCM consulted for admission.  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 DO NOT RESUSCITATE status prior to arrival Chronic foley (POA) CAF HFrEF (20-25%)  Diastolic HF Cor Pulmonale  CAD Diabetes w/ complications including non-healing ulcers Esophageal stricture requiring dilation  Recent aspiration PNA  Chronic anemia  Depression and anxiety  headaches  HTN and hypotension (home meds including midodrine currently) HL SIADH (remote) OSA Stroke Prior tobacco abuse Prior polysubstance abuse   Significant Hospital Events: Including procedures, antibiotic start and stop dates in addition to other pertinent events   04/02/21 Admitted, transferred to [facility] for heart failure. Coox 73%, mixed septic and cardiogenic shock. Edema to his shoulders. On milrinone, CAP antibiotics. 6/8 wound care and ortho consulted for BKA  Interim History / Subjective:  Passed SLP evaluation- regular diet. Today he complains of some pain in his LLE.  Objective   Blood pressure 99/72, pulse 100, temperature (!) 97.1 F (36.2 C), temperature source Oral, resp. rate 11, height 6\' 3"  (1.905 m), weight 110.1 kg, SpO2 96 %. CVP:  [18 mmHg-20 mmHg] 18 mmHg      Intake/Output Summary (Last 24 hours) at 04/04/2021 0725 Last data filed at 04/04/2021 0600 Gross per 24 hour  Intake 4376.06 ml  Output 8675 ml  Net -4298.94 ml    Filed Weights   04/02/21 1300 04/03/21 0600 04/04/21 0600  Weight: 120.3 kg 117.5 kg 110.1 kg     Examination: Constitutional: chronically ill appearing man sitting up in bed in NAD, just finished breakfast HEENT: [place]/AT, eyes anicteric Cardiovascular: tachycardic, irreg rhythm Respiratory: no tachypnea or conversational dyspnea, reduced basilar breath sounds, faint rhales Gastrointestinal: obese, soft, NT Skin: L BKA- bandage not removed. R missing digits. No cyanosis or clubbing. Neurologic: more awake and alert, answering questions appropriately, moving all extremities Psychiatric: more cooperative today   Labs/imaging that I havepersonally reviewed  (right click and "Reselect all SmartList Selections" daily)  coox 52% Na+ 134 K+ 3.5 BUN 35 Cr 1.45 AST 162 ALT 124 Tbili 2.8 WBC 9.3 PCT 2.99 >2.57> 1.57  Resolved Hospital Problem list   Acute metabolic encephalopathy Lactic acidosis  Assessment & Plan:  Shock- mixed cardiac and septic. Coox worse today, suspect this is unmasking his severe cardiac failure as his sepsis is resolving. Acute on chronic HFrEF due to ICM, CAD with severe hypervolemia Chronic Afib, rate controlled -increase milrinone to 0.33mcg -con't aggressive diuresis & electrolyte repletion -heparin gtt on hold due to elevated INR; likely can restart tomorrow -appreciate cardiology's input- adding digoxin, con't holding Bblocker -tele monitoring -daily coox   Acute respiratory failure with hypoxia Multilobar pneumonia; probably less of a contributor to shock given favorable response to milrinone Acute cardiogenic pulmonary edema -complete 7 days of cefepime, 5 days of azithromycin -con't aggressive diuresis -wean O2 as able -pulmonary hygiene  PAD s/p BKA, previous dehisced TMA -appreciate wound care's recommendations. -Per ortho- staples to remain in place, patient should be working on extension exercises, needs to keep stump shrinker in place. Reinforced this with Fernando Maddox, Fernando Maddox today.  AKI, resolving -strict I/Os -renally dose meds, avoid  nephrotoxic meds -con't monitoring renal function  Hypokalemia -con't aggressive electrolyte repletion  Congestive hepatopathy vs shock liver- elevated bilirubin and transaminases- improving -con't to monitor  -con't diuresis and inotropic support  Hyperglycemia, controlled -continue lantus 10 units today -SSI PRN -goal BG 140-180 while in ICU  Question of SIADH PTA; would currently favor heart failure -con't diuresis -monitor Na+  Chronic anemia due to critical illness -transfuse for Hb <7 or hemodynamically significant bleeding  Muscular deconditioning -PT, OT -he has been encouraged to do in-bed exercises throughout the day  Recurrent admissions with severe underlying disease -palliative care consult- hoping for improvement and outpatient follow up with Palliative Care. They will see him again Monday inpatient. -agree with patient that DNR is appropriate    Best practice (right click and "Reselect all SmartList Selections" daily)  Diet:  Oral Pain/Anxiety/Delirium protocol (if indicated): No VAP protocol (if indicated): Not indicated DVT prophylaxis: SCD and Contraindicated GI prophylaxis: N/A Glucose control:  SSI Yes and Basal insulin Yes Central venous access:  Yes, and it is still needed Arterial line:  N/A Foley:  N/A Mobility:  OOB  PT consulted: N/A Last date of multidisciplinary goals of care discussion [pending, palliative consult placed] Code Status:  DNR Disposition: ICU  Labs   CBC: Recent Labs  Lab 04/02/21 0928 04/03/21 0434 04/04/21 0414  WBC 14.5* 12.4* 9.3  HGB 9.7* 9.2* 9.2*  HCT 32.7* 29.6* 29.6*  MCV 89.8 88.1 87.3  PLT 289 269 261     Basic Metabolic Panel: Recent Labs  Lab 04/02/21 0500 04/02/21 0947 04/02/21 2002 04/03/21 0434 04/04/21 0414  NA 128* 132* 133* 133* 134*  K SPECIMEN HEMOLYZED. HEMOLYSIS MAY AFFECT INTEGRITY OF RESULTS. 5.5* 4.6 3.8 3.5  CL 92* 96* 94* 93* 86*  CO2 11* 20* 29 29 39*  GLUCOSE 319* 189*  144* 172* 149*  BUN 33* 40* 42* 43* 35*  CREATININE 1.55* 1.81* 1.64* 1.56* 1.45*  CALCIUM 8.9 8.5* 8.3* 8.1* 7.9*  MG  --   --  1.9 1.7 1.7  PHOS  --   --   --  3.6  --     GFR: Estimated Creatinine Clearance: 73.5 mL/min (A) (by C-G formula based on SCr of 1.45 mg/dL (H)). Recent Labs  Lab 04/02/21 0346 04/02/21 0631 04/02/21 0928 04/02/21 0947 04/02/21 1220 04/02/21 1547 04/03/21 0434 04/04/21 0414  PROCALCITON  --   --   --   --   --  2.99 2.57 1.57  WBC  --   --  14.5*  --   --   --  12.4* 9.3  LATICACIDVEN >11.0* >11.0*  --  7.7* 5.5*  --   --   --      Liver Function Tests: Recent Labs  Lab 04/02/21 0500 04/03/21 0434 04/04/21 0414  AST 549* 297* 162*  ALT 133* 142* 124*  ALKPHOS 92 78 108  BILITOT 5.4* 3.5* 2.8*  PROT 6.2* 5.5* 5.8*  ALBUMIN 2.6* 2.2* 2.3*    No results for input(s): LIPASE, AMYLASE in the last 168 hours. Recent Labs  Lab 04/02/21 0928  AMMONIA 27     ABG    Component Value Date/Time   PHART 7.287 (L) 03/15/2021 1349   PCO2ART 19.7 (LL) 03/15/2021 1349   PO2ART 105 03/15/2021 1349   HCO3 11.6 (L) 04/02/2021 0553   TCO2 10 (L) 03/15/2021 1349   ACIDBASEDEF 14.7 (H) 04/02/2021 0553   O2SAT 51.9 04/04/2021 0414  This patient is critically ill with multiple organ system failure which requires frequent high complexity decision making, assessment, support, evaluation, and titration of therapies. This was completed through the application of advanced monitoring technologies and extensive interpretation of multiple databases. During this encounter critical care time was devoted to patient care services described in this note for 36 minutes.  Steffanie Dunn, DO 04/04/21 8:43 AM [facility]

## 2021-04-04 NOTE — Progress Notes (Signed)
Progress Note  Patient Name: Fernando Maddox Date of Encounter: 04/04/2021  [facility] Cardiologist: Garwin Brothersajan R Revankar, MD   Subjective   Much more alert today. Eating well. Denies SOB or chest pain.   Inpatient Medications    Scheduled Meds:  Chlorhexidine Gluconate Cloth  6 each Topical Daily   digoxin  0.125 mg Oral Daily   feeding supplement  237 mL Oral TID BM   furosemide  60 mg Intravenous Q6H   insulin aspart  0-15 Units Subcutaneous TID WC   insulin glargine  10 Units Subcutaneous Daily   mouth rinse  15 mL Mouth Rinse BID   multivitamin with minerals  1 tablet Oral Daily   potassium chloride  40 mEq Oral Once   sodium chloride flush  10-40 mL Intracatheter Q12H   sodium chloride flush  3 mL Intravenous Q12H   Continuous Infusions:  sodium chloride     azithromycin Stopped (04/03/21 1331)   ceFEPime (MAXIPIME) IV 2 g (04/04/21 0356)   magnesium sulfate bolus IVPB     milrinone 0.25 mcg/kg/min (04/04/21 0358)   PRN Meds: sodium chloride, docusate sodium, fentaNYL (SUBLIMAZE) injection, oxyCODONE, polyethylene glycol, sodium chloride flush, sodium chloride flush   Vital Signs    Vitals:   04/04/21 0530 04/04/21 0545 04/04/21 0600 04/04/21 0700  BP:   99/72 95/84  Pulse:      Resp: 11 10 11  (!) 26  Temp:    97.6 F (36.4 C)  TempSrc:    Axillary  SpO2:      Weight:   110.1 kg   Height:        Intake/Output Summary (Last 24 hours) at 04/04/2021 0816 Last data filed at 04/04/2021 0600 Gross per 24 hour  Intake 4123.85 ml  Output 8675 ml  Net -4551.15 ml    Last 3 Weights 04/04/2021 04/03/2021 04/02/2021  Weight (lbs) 242 lb 11.6 oz 259 lb 0.7 oz 265 lb 3.4 oz  Weight (kg) 110.1 kg 117.5 kg 120.3 kg      Telemetry    Afib with controlled rate 100-110  - Personally Reviewed  ECG    None today - Personally Reviewed  Physical Exam   GEN: No acute distress.   Neck: +JVD, central line in place.  Cardiac: IRRR, no murmurs, rubs, or gallops.   Respiratory: Clear to auscultation bilaterally. GI: Soft, nontender, non-distended  Ext: 3+ diffuse edema/anasarca Neuro:  Nonfocal  Psych: Normal affect   Labs    High Sensitivity Troponin:   Recent Labs  Lab 03/15/21 0557 03/15/21 0730 04/03/21 1028  TROPONINIHS 27* 25* 28*       Chemistry Recent Labs  Lab 04/02/21 0500 04/02/21 0947 04/02/21 2002 04/03/21 0434 04/04/21 0414  NA 128*   < > 133* 133* 134*  K SPECIMEN HEMOLYZED. HEMOLYSIS MAY AFFECT INTEGRITY OF RESULTS.   < > 4.6 3.8 3.5  CL 92*   < > 94* 93* 86*  CO2 11*   < > 29 29 39*  GLUCOSE 319*   < > 144* 172* 149*  BUN 33*   < > 42* 43* 35*  CREATININE 1.55*   < > 1.64* 1.56* 1.45*  CALCIUM 8.9   < > 8.3* 8.1* 7.9*  PROT 6.2*  --   --  5.5* 5.8*  ALBUMIN 2.6*  --   --  2.2* 2.3*  AST 549*  --   --  297* 162*  ALT 133*  --   --  142* 124*  ALKPHOS 92  --   --  78 108  BILITOT 5.4*  --   --  3.5* 2.8*  GFRNONAA 51*   < > 48* 51* 56*  ANIONGAP 25*   < > 10 11 9    < > = values in this interval not displayed.      Hematology Recent Labs  Lab 04/02/21 0928 04/03/21 0434 04/04/21 0414  WBC 14.5* 12.4* 9.3  RBC 3.64* 3.36* 3.39*  HGB 9.7* 9.2* 9.2*  HCT 32.7* 29.6* 29.6*  MCV 89.8 88.1 87.3  MCH 26.6 27.4 27.1  MCHC 29.7* 31.1 31.1  RDW 19.4* 19.3* 19.1*  PLT 289 269 261     BNP Recent Labs  Lab 04/02/21 0346  BNP 1,993.9*      DDimer No results for input(s): DDIMER in the last 168 hours.   Radiology    DG CHEST PORT 1 VIEW  Result Date: 04/02/2021 CLINICAL DATA:  Central catheter placement EXAM: PORTABLE CHEST 1 VIEW COMPARISON:  April 02, 2021 chest radiograph obtained earlier in the day as well as April 02, 2021 chest CT. FINDINGS: Central catheter tip is in the superior vena cava. No pneumothorax. Airspace opacity in the right upper lobe consistent with pneumonia persists. There is ill-defined opacity in portions of each lung base, also likely foci of pneumonia. There is cardiac  enlargement with pulmonary vascularity normal. No adenopathy. No bone lesions. IMPRESSION: Central catheter tip in superior vena cava without pneumothorax. Extensive airspace opacity consistent with pneumonia right lobe with ill-defined areas of bibasilar opacity, likely foci of pneumonia as well. Stable cardiomegaly. Electronically Signed   By: April 04, 2021 III M.D.   On: 04/02/2021 10:21   ECHOCARDIOGRAM LIMITED  Result Date: 04/02/2021    ECHOCARDIOGRAM LIMITED REPORT   Patient Name:   Fernando Maddox Date of Exam: 04/02/2021 Medical Rec #:  06/02/2021     Height:       75.0 in Accession #:    [id number]    Weight:       265.2 lb Date of Birth:  Dec 14, 1960     BSA:          2.474 m Patient Age:    59 years      BP:           103/87 mmHg Patient Gender: M             HR:           103 bpm. Exam Location:  Inpatient Procedure: Limited Echo, Limited Color Doppler and Cardiac Doppler                     STAT ECHO Reported to: Dr. 09/28/1961 on 04/02/2021 8:38:00 AM. Indications:    Cardiomyopathy-Unspecified I42.9  History:        Patient has prior history of Echocardiogram examinations, most                 recent 02/15/2021. CHF, CAD, Stroke, Arrythmias:Atrial                 Fibrillation; Risk Factors:Current Smoker, Hypertension,                 Dyslipidemia and Diabetes.  Sonographer:    02/17/2021 RDCS Referring Phys: Eulah Pont [id number] IMPRESSIONS  1. Left ventricular ejection fraction, by estimation, is 20 to 25%. The left ventricle has severely decreased function. The left ventricle demonstrates regional wall motion abnormalities (see scoring diagram/findings for description).  The left ventricular internal cavity size was mildly to moderately dilated. Left ventricular diastolic function could not be evaluated. There is the interventricular septum is flattened in systole and diastole, consistent with right ventricular pressure and volume overload. There is severe global hypokinesis with relative  preservation of the basal and mid segments of the lateral wall.  2. Right ventricular systolic function is severely reduced. The right ventricular size is severely enlarged. There is moderately elevated pulmonary artery systolic pressure. The estimated right ventricular systolic pressure is 46.8 mmHg.  3. Left atrial size was moderately dilated.  4. Right atrial size was moderately dilated.  5. The pericardial effusion is circumferential.  6. The mitral valve is normal in structure. Moderate to severe mitral valve regurgitation. No evidence of mitral stenosis.  7. Tricuspid valve regurgitation is moderate to severe.  8. The aortic valve is tricuspid. There is mild calcification of the aortic valve. Aortic valve regurgitation is not visualized. Mild aortic valve sclerosis is present, with no evidence of aortic valve stenosis.  9. The inferior vena cava is dilated in size with <50% respiratory variability, suggesting right atrial pressure of 15 mmHg. Comparison(s): Prior images reviewed side by side. Mitral insufficiency was underestimated on the previous study, when it was also probably moderate-severe. Tricuspid insufficiency has worsened on the current study. FINDINGS  Left Ventricle: Left ventricular ejection fraction, by estimation, is 20 to 25%. The left ventricle has severely decreased function. The left ventricle demonstrates regional wall motion abnormalities. The left ventricular internal cavity size was mildly  to moderately dilated. There is no left ventricular hypertrophy. The interventricular septum is flattened in systole and diastole, consistent with right ventricular pressure and volume overload. Left ventricular diastolic function could not be evaluated. Left ventricular diastolic function could not be evaluated due to atrial fibrillation.  LV Wall Scoring: There is severe global hypokinesis with relative preservation of the basal and mid segments of the lateral wall. Right Ventricle: The right  ventricular size is severely enlarged. Right ventricular systolic function is severely reduced. There is moderately elevated pulmonary artery systolic pressure. The tricuspid regurgitant velocity is 2.82 m/s, and with an assumed right atrial pressure of 15 mmHg, the estimated right ventricular systolic pressure is 46.8 mmHg. Left Atrium: Left atrial size was moderately dilated. Right Atrium: Right atrial size was moderately dilated. Pericardium: Trivial pericardial effusion is present. The pericardial effusion is circumferential. Mitral Valve: The mitral valve is normal in structure. Moderate to severe mitral valve regurgitation, with eccentric posteriorly directed jet. No evidence of mitral valve stenosis. Tricuspid Valve: The tricuspid valve is normal in structure. Tricuspid valve regurgitation is moderate to severe. The flow in the hepatic veins is reversed during ventricular systole. Aortic Valve: The aortic valve is tricuspid. There is mild calcification of the aortic valve. Aortic valve regurgitation is not visualized. Mild aortic valve sclerosis is present, with no evidence of aortic valve stenosis. Pulmonic Valve: The pulmonic valve was normal in structure. Pulmonic valve regurgitation is not visualized. Aorta: The aortic root is normal in size and structure. Venous: The inferior vena cava is dilated in size with less than 50% respiratory variability, suggesting right atrial pressure of 15 mmHg. IAS/Shunts: No atrial level shunt detected by color flow Doppler. Additional Comments: Mild ascites is present. LEFT VENTRICLE PLAX 2D LVIDd:         6.00 cm LVIDs:         5.20 cm LV PW:         0.90 cm LV IVS:  1.00 cm LVOT diam:     2.20 cm LV SV:         40 LV SV Index:   16 LVOT Area:     3.80 cm  LV Volumes (MOD) LV vol d, MOD A4C: 200.0 ml LV vol s, MOD A4C: 153.0 ml LV SV MOD A4C:     200.0 ml RIGHT VENTRICLE TAPSE (M-mode): 1.0 cm LEFT ATRIUM         Index LA diam:    4.60 cm 1.86 cm/m  AORTIC VALVE  LVOT Vmax:   77.67 cm/s LVOT Vmean:  50.900 cm/s LVOT VTI:    0.105 m  AORTA Ao Root diam: 3.70 cm MR Peak grad: 67.2 mmHg   TRICUSPID VALVE MR Mean grad: 44.0 mmHg   TR Peak grad:   31.8 mmHg MR Vmax:      410.00 cm/s TR Vmax:        282.00 cm/s MR Vmean:     313.0 cm/s                           SHUNTS                           Systemic VTI:  0.10 m                           Systemic Diam: 2.20 cm Thurmon Fair MD Electronically signed by Thurmon Fair MD Signature Date/Time: 04/02/2021/9:13:44 AM    Final      Cardiac Studies   Limited echo 04/02/2021 1. Left ventricular ejection fraction, by estimation, is 20 to 25%. The  left ventricle has severely decreased function. The left ventricle  demonstrates regional wall motion abnormalities (see scoring  diagram/findings for description). The left  ventricular internal cavity size was mildly to moderately dilated. Left  ventricular diastolic function could not be evaluated. There is the  interventricular septum is flattened in systole and diastole, consistent  with right ventricular pressure and  volume overload. There is severe global hypokinesis with relative  preservation of the basal and mid segments of the lateral wall.   2. Right ventricular systolic function is severely reduced. The right  ventricular size is severely enlarged. There is moderately elevated  pulmonary artery systolic pressure. The estimated right ventricular  systolic pressure is 46.8 mmHg.   3. Left atrial size was moderately dilated.   4. Right atrial size was moderately dilated.   5. The pericardial effusion is circumferential.   6. The mitral valve is normal in structure. Moderate to severe mitral  valve regurgitation. No evidence of mitral stenosis.   7. Tricuspid valve regurgitation is moderate to severe.   8. The aortic valve is tricuspid. There is mild calcification of the  aortic valve. Aortic valve regurgitation is not visualized. Mild aortic  valve sclerosis  is present, with no evidence of aortic valve stenosis.   9. The inferior vena cava is dilated in size with <50% respiratory  variability, suggesting right atrial pressure of 15 mmHg.   Comparison(s): Prior images reviewed side by side. Mitral insufficiency  was underestimated on the previous study, when it was also probably  moderate-severe. Tricuspid insufficiency has worsened on the current  study.     Patient Profile     60 y.o. male with a hx of CAD, chronic atrial fibrillation, history of CVA, OSA on CPAP, HTN,  HLD, DM2, tobacco abuse, combined systolic and diastolic heart failure, recent left BKA secondary to osteomyelitis, and history of polysubstance abuse who is being seen 04/02/2021 for the evaluation of cardiogenic vs septic shock at the request of Dr. Everardo All.  Assessment & Plan    Acute septic shock. On broad spectrum antibiotics per CCM. BC negative to date. Severely acidotic. Acidosis improving with lactate 11>>5.5. Potential sources include PNA or urinary source. Seen by Ortho and stump looks good.  Acute on chronic systolic CHF with cardiogenic shock. EF 20-25% with biventricular failure. CVP initially elevated to 23 mm Hg. Patient has diffuse anasarca with initial Co- ox better than anticipated at 66 initially. now 51% on milrinone. BNP elevated 1,994. Now on IV milrinone. BP soft but stable. Afib rate is controlled.Diruesing well with IV lasix - I/O negative 8.3 liters since admit. Negative 5 liters yesterday. CVP still elevated 18 mm Hg.  Weight coming down 23 lbs since admit but still grossly voe overloaded. Renal function improving. Will continue to hold beta blocker for now. Added digoxin for rate control and CHF.  Consider adding aldactone as renal function improves. Will increase milrinone today and reduce lasix to tid. Acute kidney injury. Creatinine 0>9>> 1.8. now improved to 1.45.    CAD. Per history. ? Prior stent. No acute ischemic changes on Ecg - troponin negative.   S/p left BKA  OSA on CPAP in past  HLD.  Afib chronic rate currently controlled. On chronic anticoagulation. Auto anticoagulated with sepsis and acute hepatic dysfunction. Plan to resume oral anticoagulation once INR < 2. Would consider switching Xarelto to Eliquis.      The patient is critically ill with multiple organ systems failure and requires high complexity decision making for assessment and support, frequent evaluation and titration of therapies, application of advanced monitoring technologies and extensive interpretation of multiple databases.   For questions or updates, please contact [facility] Please consult [email or web address] for contact info under        Signed, Kaprice Kage [place], MD  04/04/2021, 8:16 AM

## 2021-04-04 NOTE — Evaluation (Signed)
Physical Therapy Evaluation Patient Details Name: Fernando Maddox MRN: [id number] DOB: July 01, 1961 Today's Date: 04/04/2021   History of Present Illness  60 yo admitted from [facility] 6/7 with cardiogenic shock and FTT. PMHx: Lt BKA, CAD, Afib, CHf, HLD, HTN, polysubstance abuse, DM  Clinical Impression  Pt pleasant and joking throughout session with pain medication received beginning of session. Pt uanble to recall how often he gets OOB at SNF or when the last time he attempted standing was. Pt with poor transfers ability with lateral scoots and requires assist for all transfers. Pt with decreased strength, balance and transfers who will benefit from acute therapy to maximize mobility, safety and function with return to SNF.  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  Pt Mod A for getting LE back into bed and guiding trunk safely to bed. pt able to slide to [facility] with min assist of pad and cues with bil rails    Transfers Overall transfer level: Needs assistance   Transfers: Lateral/Scoot Transfers          Lateral/Scoot Transfers: Mod assist;+2 safety/equipment;+2 physical assistance General transfer comment: Overall Mod A x 2 for lateral scooting along bedside. No chair alarm on unit so  deferred chair transfer for safety  Ambulation/Gait             General Gait Details: unable- safety  Stairs            Wheelchair Mobility    Modified Rankin (Stroke Patients Only)       Balance Overall balance assessment: Needs assistance Sitting-balance support: Bilateral upper extremity supported Sitting balance-Leahy Scale: Fair Sitting balance - Comments: able to sit EOB with and without UE support                                     Pertinent Vitals/Pain Pain Assessment: Faces Pain Score: 4  Faces Pain Scale: Hurts a little bit Pain Location: LLE Pain Descriptors / Indicators: Aching;Guarding Pain Intervention(s): Limited activity within patient's tolerance;Monitored during session;Premedicated before session;Repositioned    Home Living Family/patient expects to be discharged to:: Skilled nursing facility Living Arrangements: Alone Available Help at Discharge: Family;Available PRN/intermittently Type of Home: House Home Access: Stairs to enter Entrance Stairs-Rails: Doctor, general practice of Steps: 5 Home Layout: One level   Additional Comments: Prior to recent admissions, pt lived alone but recently has been at [facility] for rehab    Prior Function Level of Independence: Needs assistance   Gait / Transfers Assistance Needed: Reports it has been a while since able to stand, unsure how often he gets to a WC  ADL's / Homemaking Assistance Needed: Assist with ADLs at SNF though unsure how  much assist  Comments: Prior to initial amputations, was Independent in daily tasks. Has required assist for transfers/ADLs at hospital and SNF rehab     Hand Dominance   Dominant Hand: Right    Extremity/Trunk Assessment   Upper Extremity Assessment Upper Extremity Assessment: Defer to OT evaluation RUE Deficits / Details: edematous, most notably around elbow LUE Deficits / Details: edematous, most notably around elbow    Lower  Extremity Assessment Lower Extremity Assessment: Generalized weakness;RLE deficits/detail RLE Deficits / Details: grossly 3/5 LLE Deficits / Details: BKA with grossly 10 degree flexion contracture    Cervical / Trunk Assessment Cervical / Trunk Assessment: Other exceptions Cervical / Trunk Exceptions: rounded shoulders  Communication   Communication: No difficulties  Cognition Arousal/Alertness: Awake/alert Behavior During Therapy: Restless Overall Cognitive Status: Impaired/Different from baseline Area of Impairment: Orientation;Attention;Memory;Following commands;Safety/judgement;Awareness;Problem solving                 Orientation Level: Disoriented to;Time Current Attention Level: Selective Memory: Decreased recall of precautions;Decreased short-term memory Following Commands: Follows one step commands with increased time;Follows multi-step commands inconsistently Safety/Judgement: Decreased awareness of deficits;Decreased awareness of safety Awareness: Emergent Problem Solving: Slow processing;Decreased initiation;Difficulty sequencing;Requires verbal cues;Requires tactile cues General Comments: Pt reports it is May but able to report year. Pt alert and easily distracted though pleasant and joking. Pt with decreased awareness of deficits/medical complexities. Perseverating on desire for drink of water. Benefits from repetition of cues for follow-through. Able to acknowledge need to urinate and asked for urinal after condom cath malfunction      General Comments General comments (skin integrity, edema, etc.): Received on 4 L O2, variable pleth and switched probes with SpO2 >97% so reduced to 2 L O2 with O2 sats remaining >95%. Educated/encouraged elevation of extremities due to swelling, as well as importance of knee extension for joint integrity as pt reports desire for prosthetic    Exercises General Exercises - Lower Extremity Long Arc Quad: AROM;Both;Seated;10 reps Hip  Flexion/Marching: AROM;Both;Seated;10 reps   Assessment/Plan    PT Assessment Patient needs continued PT services  PT Problem List Decreased strength;Decreased activity tolerance;Decreased balance;Decreased mobility;Decreased cognition;Decreased safety awareness;Decreased knowledge of precautions;Decreased knowledge of use of DME;Pain;Decreased range of motion       PT Treatment Interventions DME instruction;Functional mobility training;Therapeutic activities;Therapeutic exercise;Balance training;Cognitive remediation;Patient/family education    PT Goals (Current goals can be found in the Care Plan section)  Acute Rehab PT Goals Patient Stated Goal: move around PT Goal Formulation: With patient Time For Goal Achievement: 05-09-21 Potential to Achieve Goals: Fair    Frequency Min 2X/week   Barriers to discharge Decreased caregiver support      Co-evaluation PT/OT/SLP Co-Evaluation/Treatment: Yes Reason for Co-Treatment: Complexity of the patient's impairments (multi-system involvement);For patient/therapist safety PT goals addressed during session: Mobility/safety with mobility;Balance;Strengthening/ROM OT goals addressed during session: ADL's and self-care       AM-PAC PT "6 Clicks" Mobility  Outcome Measure Help needed turning from your back to your side while in a flat bed without using bedrails?: A Little Help needed moving from lying on your back to sitting on the side of a flat bed without using bedrails?: A Little Help needed moving to and from a bed to a chair (including a wheelchair)?: A Lot Help needed standing up from a chair using your arms (e.g., wheelchair or bedside chair)?: Total Help needed to walk in hospital room?: Total Help needed climbing 3-5 steps with a railing? : Total 6 Click Score: 11  End of Session   Activity Tolerance: Patient tolerated treatment well Patient left: in bed;with call bell/phone within reach;with bed alarm set Nurse  Communication: Need for lift equipment;Mobility status PT Visit Diagnosis: Other abnormalities of gait and mobility (R26.89);Pain;History of falling (Z91.81);Muscle weakness (generalized) (M62.81)    Time: 9191-6606 PT Time Calculation (min) (ACUTE ONLY): 29 min   Charges:   PT Evaluation $PT Eval Moderate Complexity: 1 Mod          Gissela Bloch P, PT Acute Rehabilitation Services Pager: [phone] Office: [phone]   Meridee Branum B Donnelle Olmeda 04/04/2021, 9:40 AM

## 2021-04-04 NOTE — Evaluation (Signed)
Occupational Therapy Evaluation Patient Details Name: Fernando Maddox MRN: [id number] DOB: 11-25-1960 Today's Date: 04/04/2021    History of Present Illness 60 yo admitted from [facility] 6/7 with cardiogenic shock and FTT. PMHx: Lt BKA, CAD, Afib, CHf, HLD, HTN, polysubstance abuse, DM   Clinical Impression   Pt admitted from SNF rehab with diagnoses above. Pt pleasantly confused and agreeable for EOB activities. Pt overall Mod A for bed mobility and Mod A x 2 for lateral scooting along bedside. Pt able to sit EOB for simple UB ADLs with intermittent posterior lean due to fatigue, benefiting from cues to stay on task. Pt requires Min A for UB ADLs and Total A for LB ADLs. Encouraged elevation of extremities to combat swelling (noted R LE weeping), as well as proper positioning of residual limb. Vitals stable with tolerance of reduction to 2 L O2. Encouarge use of posey belt for any transfers OOB due to hx of pt's poor safety awareness. Recommend DC back to SNF for continued rehab.    Follow Up Recommendations  SNF;Supervision/Assistance - 24 hour    Equipment Recommendations  3 in 1 bedside commode;Wheelchair (measurements OT);Wheelchair cushion (measurements OT)    Recommendations for Other Services       Precautions / Restrictions Precautions Precautions: Fall Precaution Comments: poor safety, lethargic/confused, BKA contracture formation Restrictions Weight Bearing Restrictions: Yes LLE Weight Bearing: Non weight bearing      Mobility Bed Mobility Overal bed mobility: Needs Assistance Bed Mobility: Supine to Sit;Sit to Supine     Supine to sit: Min assist;HOB elevated Sit to supine: Mod assist   General bed mobility comments: Min A to get EOB to fully scoot hips forward, cues for sequencing and to attend to task. Pt Mod A for getting LE back into bed and guiding trunk safely to bed    Transfers Overall transfer level: Needs assistance   Transfers: Lateral/Scoot  Transfers          Lateral/Scoot Transfers: Mod assist;+2 safety/equipment;+2 physical assistance General transfer comment: Overall Mod A x 2 for lateral scooting along bedside. No posey belt on unit so deferred chair transfer for safety    Balance Overall balance assessment: Needs assistance Sitting-balance support: Bilateral upper extremity supported Sitting balance-Leahy Scale: Fair Sitting balance - Comments: able to sit EOB to brush teeth though intermittent posterior lean, due to fatigue                                   ADL either performed or assessed with clinical judgement   ADL Overall ADL's : Needs assistance/impaired Eating/Feeding: Set up;Sitting Eating/Feeding Details (indicate cue type and reason): setup to drink from cup sitting EOB and bed level Grooming: Set up;Sitting;Oral care Grooming Details (indicate cue type and reason): setup to brush teeth sitting EOB though noted to lean back and rest on elbows intermittently, multimodal cues helpful for problem solving Upper Body Bathing: Minimal assistance;Sitting   Lower Body Bathing: Maximal assistance;Sitting/lateral leans;Bed level   Upper Body Dressing : Bed level;Moderate assistance Upper Body Dressing Details (indicate cue type and reason): due to line mgmt, cues for sequencing Lower Body Dressing: Total assistance;Sitting/lateral leans;Bed level       Toileting- Clothing Manipulation and Hygiene: Total assistance;Bed level         General ADL Comments: Would need at least +2 for any LB ADLs attempted in standing due to poor safety awareness  Vision Baseline Vision/History: Wears glasses Patient Visual Report: No change from baseline Vision Assessment?: No apparent visual deficits     Perception     Praxis      Pertinent Vitals/Pain Pain Assessment: Faces Faces Pain Scale: Hurts a little bit Pain Location: generalized Pain Intervention(s): Patient requesting pain meds-RN  notified;RN gave pain meds during session     Hand Dominance Right   Extremity/Trunk Assessment Upper Extremity Assessment Upper Extremity Assessment: RUE deficits/detail;LUE deficits/detail RUE Deficits / Details: edematous, most notably around elbow LUE Deficits / Details: edematous, most notably around elbow   Lower Extremity Assessment Lower Extremity Assessment: Defer to PT evaluation   Cervical / Trunk Assessment Cervical / Trunk Assessment: Kyphotic   Communication Communication Communication: No difficulties   Cognition Arousal/Alertness: Awake/alert Behavior During Therapy: Flat affect;Impulsive;Restless Overall Cognitive Status: Impaired/Different from baseline Area of Impairment: Orientation;Attention;Memory;Following commands;Safety/judgement;Awareness;Problem solving                 Orientation Level: Disoriented to;Time Current Attention Level: Selective Memory: Decreased recall of precautions;Decreased short-term memory Following Commands: Follows one step commands with increased time;Follows multi-step commands inconsistently Safety/Judgement: Decreased awareness of deficits;Decreased awareness of safety Awareness: Emergent Problem Solving: Slow processing;Decreased initiation;Difficulty sequencing;Requires verbal cues;Requires tactile cues General Comments: Pt reports it is May but able to report year. Pt alert and easily distracted though pleasant and joking. Pt with decreased awareness of deficits/medical complexities. Perseverating on desire for drink of water. Benefits from repetition of cues for follow-through. Able to acknowledge need to urinate and asked for urinal after condom cath malfunction   General Comments  Received on 4 L O2, variable pleth and switched probes with SpO2 >97% so reduced to 2 L O2 with O2 sats remaining >95%. Educated/encouraged elevation of extremities due to swelling, as well as importance of knee extension for joint integrity  as pt reports desire for prosthetic    Exercises     Shoulder Instructions      Home Living Family/patient expects to be discharged to:: Skilled nursing facility Living Arrangements: Alone Available Help at Discharge: Family;Available PRN/intermittently Type of Home: House Home Access: Stairs to enter [employer] of Steps: 5 Entrance Stairs-Rails: Right;Left Home Layout: One level     Bathroom Shower/Tub: Tub/shower unit;Walk-in shower   Bathroom Toilet: Standard         Additional Comments: Prior to recent admissions, pt lived alone but recently has been at [facility] for rehab      Prior Functioning/Environment Level of Independence: Needs assistance  Gait / Transfers Assistance Needed: Reports it has been a while since able to stand ADL's / Homemaking Assistance Needed: Assist with ADLs at SNF though unsure how much assist   Comments: Prior to initial amputations, was Independent in daily tasks. Has required assist for transfers/ADLs at hospital and SNF rehab        OT Problem List: Decreased strength;Decreased activity tolerance;Impaired balance (sitting and/or standing);Decreased cognition;Decreased safety awareness;Decreased knowledge of use of DME or AE;Decreased knowledge of precautions;Cardiopulmonary status limiting activity      OT Treatment/Interventions: Self-care/ADL training;Therapeutic exercise;Energy conservation;DME and/or AE instruction;Therapeutic activities;Patient/family education;Balance training;Cognitive remediation/compensation    OT Goals(Current goals can be found in the care plan section) Acute Rehab OT Goals Patient Stated Goal: get a drink of water OT Goal Formulation: With patient Time For Goal Achievement: 05-04-21 Potential to Achieve Goals: Fair ADL Goals Pt Will Perform Grooming: with modified independence;sitting Pt Will Perform Upper Body Dressing: with set-up;sitting Pt Will Perform Lower Body Dressing: with  mod  assist;sitting/lateral leans;bed level Pt Will Transfer to Toilet: with max assist;squat pivot transfer;with transfer board;bedside commode Pt/caregiver will Perform Home Exercise Program: Increased strength;With theraband;Both right and left upper extremity;With Supervision;With written HEP provided  OT Frequency: Min 2X/week   Barriers to D/C:            Co-evaluation PT/OT/SLP Co-Evaluation/Treatment: Yes Reason for Co-Treatment: Necessary to address cognition/behavior during functional activity;For patient/therapist safety;To address functional/ADL transfers   OT goals addressed during session: ADL's and self-care      AM-PAC OT "6 Clicks" Daily Activity     Outcome Measure Help from another person eating meals?: A Little Help from another person taking care of personal grooming?: A Little Help from another person toileting, which includes using toliet, bedpan, or urinal?: Total Help from another person bathing (including washing, rinsing, drying)?: A Lot Help from another person to put on and taking off regular upper body clothing?: A Little Help from another person to put on and taking off regular lower body clothing?: A Lot 6 Click Score: 14   End of Session Equipment Utilized During Treatment: Oxygen Nurse Communication: Mobility status;Other (comment) (condom cath, O2, consider posey belt for transfers)  Activity Tolerance: Patient tolerated treatment well Patient left: in bed;with call bell/phone within reach;with bed alarm set  OT Visit Diagnosis: Unsteadiness on feet (R26.81);Other abnormalities of gait and mobility (R26.89);Muscle weakness (generalized) (M62.81);Other symptoms and signs involving cognitive function                Time: 0938-1829 OT Time Calculation (min): 27 min Charges:  OT General Charges $OT Visit: 1 Visit OT Evaluation $OT Eval Moderate Complexity: 1 Mod  Bradd Canary, OTR/L Acute Rehab Services Office: [phone]   Lorre Munroe 04/04/2021,  8:38 AM

## 2021-04-05 DIAGNOSIS — R579 Shock, unspecified: Secondary | ICD-10-CM

## 2021-04-05 DIAGNOSIS — E440 Moderate protein-calorie malnutrition: Secondary | ICD-10-CM

## 2021-04-05 DIAGNOSIS — Z452 Encounter for adjustment and management of vascular access device: Secondary | ICD-10-CM

## 2021-04-05 LAB — COMPREHENSIVE METABOLIC PANEL
ALT: 100 U/L — ABNORMAL HIGH (ref 0–44)
AST: 90 U/L — ABNORMAL HIGH (ref 15–41)
Albumin: 2.3 g/dL — ABNORMAL LOW (ref 3.5–5.0)
Alkaline Phosphatase: 113 U/L (ref 38–126)
Anion gap: 7 (ref 5–15)
BUN: 31 mg/dL — ABNORMAL HIGH (ref 6–20)
CO2: 40 mmol/L — ABNORMAL HIGH (ref 22–32)
Calcium: 8 mg/dL — ABNORMAL LOW (ref 8.9–10.3)
Chloride: 82 mmol/L — ABNORMAL LOW (ref 98–111)
Creatinine, Ser: 1.16 mg/dL (ref 0.61–1.24)
GFR, Estimated: >60 mL/min (ref >60)
Glucose, Bld: 137 mg/dL — ABNORMAL HIGH (ref 70–99)
Potassium: 3.7 mmol/L (ref 3.5–5.1)
Sodium: 129 mmol/L — ABNORMAL LOW (ref 135–145)
Total Bilirubin: 2.2 mg/dL — ABNORMAL HIGH (ref 0.3–1.2)
Total Protein: 5.9 g/dL — ABNORMAL LOW (ref 6.5–8.1)

## 2021-04-05 LAB — COOXEMETRY PANEL
Carboxyhemoglobin: 1.8 % — ABNORMAL HIGH (ref 0.5–1.5)
Methemoglobin: 0.7 % (ref 0.0–1.5)
O2 Saturation: 49.9 %
Total hemoglobin: 9.1 g/dL — ABNORMAL LOW (ref 12.0–16.0)

## 2021-04-05 LAB — CBC
HCT: 29 % — ABNORMAL LOW (ref 39.0–52.0)
Hemoglobin: 9 g/dL — ABNORMAL LOW (ref 13.0–17.0)
MCH: 26.6 pg (ref 26.0–34.0)
MCHC: 31 g/dL (ref 30.0–36.0)
MCV: 85.8 fL (ref 80.0–100.0)
Platelets: 223 10*3/uL (ref 150–400)
RBC: 3.38 MIL/uL — ABNORMAL LOW (ref 4.22–5.81)
RDW: 18.7 % — ABNORMAL HIGH (ref 11.5–15.5)
WBC: 6.7 10*3/uL (ref 4.0–10.5)
nRBC: 0 % (ref 0.0–0.2)

## 2021-04-05 LAB — PROTIME-INR
INR: 1.5 — ABNORMAL HIGH (ref 0.8–1.2)
Prothrombin Time: 18.4 seconds — ABNORMAL HIGH (ref 11.4–15.2)

## 2021-04-05 LAB — GLUCOSE, CAPILLARY: Glucose-Capillary: 204 mg/dL — ABNORMAL HIGH (ref 70–99)

## 2021-04-05 LAB — MAGNESIUM: Magnesium: 2 mg/dL (ref 1.7–2.4)

## 2021-04-05 MED ORDER — HALOPERIDOL 0.5 MG PO TABS
0.5000 mg | ORAL_TABLET | ORAL | Status: DC | PRN
  Filled 2021-04-05: qty 1

## 2021-04-05 MED ORDER — GLYCOPYRROLATE 0.2 MG/ML IJ SOLN: 0.2000 mg | INTRAMUSCULAR | Status: DC | PRN

## 2021-04-05 MED ORDER — GLYCOPYRROLATE 1 MG PO TABS
1.0000 mg | ORAL_TABLET | ORAL | Status: DC | PRN
  Filled 2021-04-05: qty 1

## 2021-04-05 MED ORDER — BIOTENE DRY MOUTH MT LIQD: 15.0000 mL | OROMUCOSAL | Status: DC | PRN

## 2021-04-05 MED ORDER — HALOPERIDOL LACTATE 2 MG/ML PO CONC
0.5000 mg | ORAL | Status: DC | PRN
  Filled 2021-04-05: qty 0.3

## 2021-04-05 MED ORDER — ONDANSETRON HCL 4 MG/2ML IJ SOLN
4.0000 mg | Freq: Four times a day (QID) | INTRAMUSCULAR | Status: DC | PRN
Start: 2021-04-05 — End: 2021-04-06

## 2021-04-05 MED ORDER — ONDANSETRON 4 MG PO TBDP
4.0000 mg | ORAL_TABLET | Freq: Four times a day (QID) | ORAL | Status: DC | PRN
  Filled 2021-04-05: qty 1

## 2021-04-05 MED ORDER — POTASSIUM CHLORIDE CRYS ER 20 MEQ PO TBCR
40.0000 meq | EXTENDED_RELEASE_TABLET | Freq: Once | ORAL | Status: AC
  Administered 2021-04-05: 40 meq via ORAL
  Filled 2021-04-05: qty 2

## 2021-04-05 MED ORDER — APIXABAN 5 MG PO TABS
5.0000 mg | ORAL_TABLET | Freq: Two times a day (BID) | ORAL | Status: DC
  Administered 2021-04-05 (×2): 5 mg via ORAL
  Filled 2021-04-05 (×2): qty 1

## 2021-04-05 MED ORDER — LORAZEPAM 2 MG/ML IJ SOLN
0.5000 mg | INTRAMUSCULAR | Status: DC | PRN
  Administered 2021-04-06: 0.5 mg via INTRAVENOUS
  Filled 2021-04-05: qty 1

## 2021-04-05 MED ORDER — HALOPERIDOL LACTATE 5 MG/ML IJ SOLN: 0.5000 mg | INTRAMUSCULAR | Status: DC | PRN

## 2021-04-05 MED ORDER — POLYVINYL ALCOHOL 1.4 % OP SOLN
1.0000 [drp] | Freq: Four times a day (QID) | OPHTHALMIC | Status: DC | PRN
  Filled 2021-04-05: qty 15

## 2021-04-05 MED ORDER — FENTANYL CITRATE (PF) 100 MCG/2ML IJ SOLN
25.0000 ug | INTRAMUSCULAR | Status: DC | PRN
  Administered 2021-04-05: 100 ug via INTRAVENOUS
  Filled 2021-04-05: qty 2

## 2021-04-05 NOTE — TOC Initial Note (Addendum)
Transition of Care [facility]) - Initial/Assessment Note    Patient Details  Name: Fernando Maddox MRN: [id number] Date of Birth: 03/19/1961  [facility]) CM/SW Contact:    Terrial Rhodes, LCSWA Phone Number: 04/05/2021, 12:36 PM  Clinical Narrative:              Update-CSW spoke with Cheri and she confirmed they have a bed at [facility]. CSW will continue to follow.  CSW received consult for Residential hospice for patient at [facility] house. CSW called patients mother to confirm facility choice. Patients mother confirmed she would like CSW to make referral to [facility]. CSW called [facility] and spoke with Revonda Standard and made referral for patient. Revonda Standard confirmed with CSW that they will review and give CSW callback. CSW will continue to follow and assist with discharge planning needs.       Patient Goals and CMS Choice        Expected Discharge Plan and Services           Expected Discharge Date:  (unknown)                                    Prior Living Arrangements/Services                       Activities of Daily Living Home Assistive Devices/Equipment: Blood pressure cuff, CBG Meter, Grab bars around toilet, Grab bars in shower, Hand-held shower hose, Hospital bed, [employer], [employer], Wheelchair ADL Screening (condition at time of admission) Patient's cognitive ability adequate to safely complete daily activities?: No Is the patient deaf or have difficulty hearing?: No Does the patient have difficulty seeing, even when wearing glasses/contacts?: No Does the patient have difficulty concentrating, remembering, or making decisions?: Yes Patient able to express need for assistance with ADLs?: No Does the patient have difficulty dressing or bathing?: Yes Independently performs ADLs?: No Communication: Needs assistance (expressive difficulties, garbled speech) Is this a change from baseline?:  Pre-admission baseline Dressing (OT): Dependent Is this a change from baseline?: Pre-admission baseline Grooming: Dependent Is this a change from baseline?: Pre-admission baseline Feeding: Dependent Is this a change from baseline?: Pre-admission baseline Bathing: Dependent Is this a change from baseline?: Pre-admission baseline Toileting: Dependent Is this a change from baseline?: Pre-admission baseline In/Out Bed: Dependent Is this a change from baseline?: Pre-admission baseline Walks in Home: Dependent Is this a change from baseline?: Pre-admission baseline Does the patient have difficulty walking or climbing stairs?: Yes (secondary to weakness) Weakness of Legs: Right (left BKA, also has left sided weakness secondary to a stroke) Weakness of Arms/Hands: Both  Permission Sought/Granted                  Emotional Assessment              Admission diagnosis:  Cardiogenic shock (HCC) [R57.0] Shock (HCC) [R57.9] Patient Active Problem List   Diagnosis Date Noted   Malnutrition of moderate degree 04/04/2021   Anemia    Cardiogenic shock (HCC) 04/02/2021   Shock (HCC) 04/02/2021   Lactic acidosis    Acute metabolic encephalopathy 03/18/2021   AKI (acute kidney injury) (HCC) 03/18/2021   Transaminitis 03/18/2021   Dehiscence of amputation stump (HCC)    Foot ulcer (HCC)    Acute combined systolic and diastolic congestive heart failure (HCC)    Hyponatremia  Ulcer of left foot with necrosis of bone (HCC)    Septic shock (HCC) 02/14/2021   Atrial fibrillation (HCC)    Cerebrovascular disease    Chronic pain syndrome    Hyperlipidemia    Shoulder pain    Normocytic anemia    GERD (gastroesophageal reflux disease)    Hypercholesteremia    Hypogonadism male    Rotator cuff syndrome    Sleep apnea    Chronic anticoagulation 03/25/2018   Acute on chronic systolic heart failure (HCC) 03/25/2018   Chronic systolic congestive heart failure (HCC) 03/25/2018    Osteomyelitis, unspecified (HCC) 10/06/2017   Diabetic ulcer of toe of right foot associated with type 2 diabetes mellitus (HCC) 10/01/2017   Cellulitis 09/29/2017   History of CVA (cerebrovascular accident) 09/29/2017   Polysubstance abuse (HCC) 09/29/2017   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   Community acquired pneumonia 08/11/2016   Tobacco use 08/11/2016   Acute combined systolic and diastolic CHF, NYHA class 1 (HCC) 08/07/2016   SIADH (syndrome of inappropriate ADH production) (HCC) 08/07/2016   History of drug abuse (HCC) 08/05/2016   Osteoarthritis 08/05/2016   PAF (paroxysmal atrial fibrillation) (HCC) 08/05/2016   Stroke (HCC) 08/05/2016   Diabetes mellitus, type 2 (HCC) 02/11/2010   Hyperlipemia 02/11/2010   HYPERTENSION, UNSPECIFIED 02/11/2010   CAD (coronary artery disease) 02/11/2010   Hypertension 02/11/2010   ESOPHAGEAL STRICTURE 02/11/2010   BACK PAIN 02/11/2010   HEADACHE 02/11/2010   CHEST PAIN 02/11/2010   DEPRESSION/ANXIETY 02/11/2010   PCP:  Sherron Monday, MD Pharmacy:   [facility] - [place], [place] - [address] [place] [address] Phone: [phone] Fax: [phone]     Social Determinants of Health (SDOH) Interventions    Readmission Risk Interventions No flowsheet data found.

## 2021-04-05 NOTE — Progress Notes (Signed)
NAME:  Fernando Maddox, MRN:  [id number], DOB:  December 23, 1960, LOS: 3 ADMISSION DATE:  04/02/2021, CONSULTATION DATE:  04/02/21 REFERRING MD:  ER, CHIEF COMPLAINT:  FTT   History of Present Illness:  60 year old man with recent admission for likely cardiogenic shock presenting from SNF with FTT, anorexia, developing confusion.  Found to be hypoglycemic with lactate > 11.  PCCM consulted for admission.  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 DO NOT RESUSCITATE status prior to arrival Chronic foley (POA) CAF HFrEF (20-25%)  Diastolic HF Cor Pulmonale  CAD Diabetes w/ complications including non-healing ulcers Esophageal stricture requiring dilation  Recent aspiration PNA  Chronic anemia  Depression and anxiety  headaches  HTN and hypotension (home meds including midodrine currently) HL SIADH (remote) OSA Stroke Prior tobacco abuse Prior polysubstance abuse   Significant Hospital Events: Including procedures, antibiotic start and stop dates in addition to other pertinent events   04/02/21 Admitted, transferred to [facility] for heart failure. Coox 73%, mixed septic and cardiogenic shock. Edema to his shoulders. On milrinone, CAP antibiotics. 6/8 wound care and ortho consulted for BKA 6/10 remains milrinone, diuresis  Interim History / Subjective:   Feeding himself. Remains on milrinone   Objective   Blood pressure 97/71, pulse 92, temperature (!) 97.5 F (36.4 C), temperature source Oral, resp. rate 12, height 6\' 3"  (1.905 m), weight 103.3 kg, SpO2 96 %. CVP:  [13 mmHg-19 mmHg] 18 mmHg      Intake/Output Summary (Last 24 hours) at 04/05/2021 0925 Last data filed at 04/05/2021 0900 Gross per 24 hour  Intake 2442.41 ml  Output 5560 ml  Net -3117.59 ml   Filed Weights   04/03/21 0600 04/04/21 0600 04/05/21 0318  Weight: 117.5 kg 110.1 kg 103.3 kg     Examination: Constitutional: Chronically ill-appearing gentleman appears older than stated age HEENT: NCAT, tracking appropriately Cardiovascular: Tachycardic, irregular Respiratory: Diminished breath sounds in the bases bilaterally, shallow inspiratory effort Gastrointestinal: Obese, soft nontender Skin: Left BKA, right lower extremity missing digits, severe right lower extremity edema Neurologic: Alert oriented following commands Psychiatric: Alert   Labs/imaging that I havepersonally reviewed  (right click and "Reselect all SmartList Selections" daily)  Sodium 129 Potassium 3.7 Creatinine 1.1 White blood cell count 6.7  Resolved Hospital Problem list   Acute metabolic encephalopathy Lactic acidosis  Assessment & Plan:   Shock- mixed cardiac and septic. Coox worse today, suspect this is unmasking his severe cardiac failure as his sepsis is resolving. 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 Potentially will end up on persistent need for milrinone or some inotrope. Patient is very depressed and does not want to be in the hospital.  He states he wants to go home. Will reach out to palliative care for consideration of transition to home hospice versus a hospice placement facility.  Acute respiratory failure with hypoxia Multilobar pneumonia; probably less of a contributor to shock given favorable response to milrinone Acute cardiogenic pulmonary edema Plan: Complete 7 days of cefepime, 5 days azithromycin Continue diuresis Wean FiO2 to maintain SPO2 greater than 88%  PAD s/p BKA, previous dehisced TMA -Continue wound care - Postop management per Ortho. Appreciate recs.  AKI, resolving Plan: Avoid nephrotoxic drugs Continue to follow urine  output I's and O's   Hypokalemia -c replete to maintain potassium greater than 4  Congestive hepatopathy vs shock liver- elevated bilirubin and transaminases- improving Plan: Diuresis with inotrope support.  He Continue to follow liver function  Hyperglycemia, controlled Plan: Lantus, as needed SSI  Hypervolemic hyponatremia. Plan: Continue diuresis Follow sodium  Chronic anemia due to critical illness Plan: New conservative transfusion threshold for hemoglobin less than 7  Muscular deconditioning Continue PT OT  Goals of care: Appreciate ongoing palliative care input. Patient is a DNR    Best practice (right click and "Reselect all SmartList Selections" daily)  Diet:  Oral Pain/Anxiety/Delirium protocol (if indicated): No VAP protocol (if indicated): Not indicated DVT prophylaxis: SCD and Contraindicated GI prophylaxis: N/A Glucose control:  SSI Yes and Basal insulin Yes Central venous access:  Yes, and it is still needed Arterial line:  N/A Foley:  N/A Mobility:  OOB  PT consulted: N/A Last date of multidisciplinary goals of care discussion [pending, palliative consult placed] Code Status:  DNR Disposition: ICU  Labs   CBC: Recent Labs  Lab 04/02/21 0928 04/03/21 0434 04/04/21 0414 04/05/21 0327  WBC 14.5* 12.4* 9.3 6.7  HGB 9.7* 9.2* 9.2* 9.0*  HCT 32.7* 29.6* 29.6* 29.0*  MCV 89.8 88.1 87.3 85.8  PLT 289 269 261 223    Basic Metabolic Panel: Recent Labs  Lab 04/02/21 0947 04/02/21 2002 04/03/21 0434 04/04/21 0414 04/05/21 0327  NA 132* 133* 133* 134* 129*  K 5.5* 4.6 3.8 3.5 3.7  CL 96* 94* 93* 86* 82*  CO2 20* 29 29 39* 40*  GLUCOSE 189* 144* 172* 149* 137*  BUN 40* 42* 43* 35* 31*  CREATININE 1.81* 1.64* 1.56* 1.45* 1.16  CALCIUM 8.5* 8.3* 8.1* 7.9* 8.0*  MG  --  1.9 1.7 1.7 2.0  PHOS  --   --  3.6  --   --    GFR: Estimated Creatinine Clearance: 89.2 mL/min (by C-G formula based on SCr of 1.16 mg/dL). Recent Labs  Lab  04/02/21 0346 04/02/21 0631 04/02/21 0928 04/02/21 0947 04/02/21 1220 04/02/21 1547 04/03/21 0434 04/04/21 0414 04/05/21 0327  PROCALCITON  --   --   --   --   --  2.99 2.57 1.57  --   WBC  --   --  14.5*  --   --   --  12.4* 9.3 6.7  LATICACIDVEN >11.0* >11.0*  --  7.7* 5.5*  --   --   --   --     Liver Function Tests: Recent Labs  Lab 04/02/21 0500 04/03/21 0434 04/04/21 0414 04/05/21 0327  AST 549* 297* 162* 90*  ALT 133* 142* 124* 100*  ALKPHOS 92 78 108 113  BILITOT 5.4* 3.5* 2.8* 2.2*  PROT 6.2* 5.5* 5.8* 5.9*  ALBUMIN 2.6* 2.2* 2.3* 2.3*   No results for input(s): LIPASE, AMYLASE in the last 168 hours. Recent Labs  Lab 04/02/21 0928  AMMONIA 27    ABG    Component Value Date/Time   PHART 7.287 (L) 03/15/2021 1349   PCO2ART 19.7 (LL) 03/15/2021 1349   PO2ART 105 03/15/2021 1349   HCO3 11.6 (L) 04/02/2021 0553   TCO2 10 (L) 03/15/2021 1349   ACIDBASEDEF 14.7 (H) 04/02/2021 0553   O2SAT 49.9 04/05/2021 0327      This patient is critically ill with multiple organ system failure; which, requires frequent high complexity decision making, assessment, support, evaluation, and titration of therapies. This was completed through the application of advanced monitoring  technologies and extensive interpretation of multiple databases. During this encounter critical care time was devoted to patient care services described in this note for 32 minutes.  Josephine Igo, DO [facility] 04/05/2021 9:25 AM

## 2021-04-05 NOTE — Discharge Summary (Signed)
Physician Discharge Summary     Patient ID: Fernando Maddox MRN: [id number] DOB/AGE: 1961/05/03 60 y.o.  Admit date: 04/02/2021 Discharge date: 04/05/2021  Discharge Diagnoses:   Shock- mixed cardiac and septic Acute on chronic HFrEF due to ICM, CAD with severe hypervolemia Chronic Afib, rate controlled Acute respiratory failure with hypoxia Multilobar pneumonia; probably less of a contributor to shock given favorable response to milrinone Acute cardiogenic pulmonary edema PAD s/p BKA, previous dehisced TMA AKI Hypokalemia Congestive hepatopathy vs shock liver Hyperglycemia Hypervolemic hyponatremia. Chronic anemia due to critical illness Muscular deconditioning  Discharge summary   Fernando Maddox is a 60 year old man with pertinent history including recent admission for likely cardiogenic shock who presented to this admission presenting from [facility] 04/02/21 with FTT, anorexia, and developing confusion.  On admission patient was found in the mixed cardiogenic and septic shock with associated hypotension and severe lactic acidosis.  On admission patient was seen hypotensive, hypothermic, and edema tracking up to the shoulders..  Pertinent lab work included hyponatremia, hypochloremia, hyperglycemia, elevated creatinine of 1.5 meeting criteria for AKI, elevated LFTs, elevated BNP, lactic acid greater than 11, white blood cell count 14.5, and hemoglobin 9.7.  Patient was admitted per PCCM for further management and care.  Thus far during hospitalization patient has been treated for mixed cardiogenic and septic shock with inotropic therapy, diuretics, and systemic anticoagulation.  Despite maximal medical therapy patient's Co. oximetry continues to decline in the setting of worsening cardiac failure.  Additionally patient received broad-spectrum IV antibiotics for multilobar lobar pneumonia.  On morning rounds per PCCM morning of 6/10 patient expressed desire for comfort approach palliative care was  consulted.  On evaluation by palliative care in collaboration with family patient elected for decision to pursue comfort care.  Patient was evaluated by outpatient hospice and accepted for transfer to [facility] house.  Patient was stabilized for transfer to hospice facility afternoon of 6/10 and discharged.   Discharge Plan by Active Problems   Shock- mixed cardiac and septic.  -Coox worse today, suspect this is unmasking his severe cardiac failure as his sepsis is resolving. Acute on chronic HFrEF due to ICM, CAD with severe hypervolemia Chronic Afib, rate controlled Acute respiratory failure with hypoxia Multilobar pneumonia; probably less of a contributor to shock given favorable response to milrinone Acute cardiogenic pulmonary edema PAD s/p BKA, previous dehisced TMA AKI Hypokalemia Congestive hepatopathy vs shock liver- Hyperglycemia Hypervolemic hyponatremia. Chronic anemia due to critical illness Muscular deconditioning Plan Patient will discharge to inpatient hospice facility for ongoing palliative care. Stop current and any further inotropic therapy No indications for further antibiotic therapy Additional diuresis as hospitalist to facilitate comfort   Significant Hospital tests/ studies  6/7 Head CT > remote MCA infarct  6/7 CT chest > Multifocal pneumonia, Layering pleural effusions, moderate on  the right and small on the left. Cardiomegaly and extensive coronary atherosclerosis  Procedures   CVC placed 6/7  Culture data/antimicrobials   6/7 COVID negaive  6/7 MRSA negative  6/7 blood cultures    Consults  Ortho    Discharge Exam: BP (!) 80/60   Pulse (!) 128   Temp 97.8 F (36.6 C) (Oral)   Resp 13   Ht 6\' 3"  (1.905 m)   Wt 103.3 kg   SpO2 97%   BMI 28.46 kg/m   General: Chronically ill appearing elderly male lying in bed, in NAD HEENT: [place]/AT, MM pink/moist, PERRL,  Neuro: Alert and oriented x3, non-focal  CV: s1s2  regular rate and  rhythm, no murmur, rubs, or gallops,  PULM:  Diminished breath sounds bilaterally, no added breath spounds GI: soft, bowel sounds active in all 4 quadrants, non-tender, non-distende Extremities: warm/dry, anasarca , left BKA Skin: no rashes or lesions   Labs at discharge   Lab Results  Component Value Date   CREATININE 1.16 04/05/2021   BUN 31 (H) 04/05/2021   NA 129 (L) 04/05/2021   K 3.7 04/05/2021   CL 82 (L) 04/05/2021   CO2 40 (H) 04/05/2021   Lab Results  Component Value Date   WBC 6.7 04/05/2021   HGB 9.0 (L) 04/05/2021   HCT 29.0 (L) 04/05/2021   MCV 85.8 04/05/2021   PLT 223 04/05/2021   Lab Results  Component Value Date   ALT 100 (H) 04/05/2021   AST 90 (H) 04/05/2021   ALKPHOS 113 04/05/2021   BILITOT 2.2 (H) 04/05/2021   Lab Results  Component Value Date   INR 1.5 (H) 04/05/2021   INR 2.3 (H) 04/04/2021   INR 3.1 (H) 04/03/2021    Current radiological studies    No results found.  Disposition:     There are no questions and answers to display.         Allergies as of 04/05/2021       Reactions   Codeine Itching, Other (See Comments)   "Allergic," per paperwork from facility   Bee Venom Other (See Comments)   "Feels like his head is on fire when he goes outside in the sun" and "Allergic," per paperwork from facility   Hydrocodone-acetaminophen Itching, Other (See Comments)   "Allergic," per paperwork from facility   Cephalexin Other (See Comments)   No mention of this allergy in notes from 07/2016 admission for osteomyelitis.  Patient was treated with Ancef and Unasyn for broader anaerobic coverage.  Patient is not aware of allergy. -08/02/2020 On 04/02/2021: "Allergic," per paperwork from facility   Vicodin [hydrocodone-acetaminophen] Itching, Other (See Comments)   "Allergic," per paperwork from facility   Zocor [simvastatin] Other (See Comments)   Myalgias and "Allergic," per paperwork from facility        Medication List      STOP taking these medications    empagliflozin 10 MG Tabs tablet Commonly known as: JARDIANCE   folic acid 1 MG tablet Commonly known as: FOLVITE   furosemide 20 MG tablet Commonly known as: LASIX   gabapentin 100 MG capsule Commonly known as: NEURONTIN   insulin aspart 100 UNIT/ML injection Commonly known as: novoLOG   losartan 25 MG tablet Commonly known as: COZAAR   midodrine 5 MG tablet Commonly known as: PROAMATINE   potassium chloride SA 20 MEQ tablet Commonly known as: KLOR-CON   rivaroxaban 20 MG Tabs tablet Commonly known as: XARELTO   spironolactone 25 MG tablet Commonly known as: Aldactone   thiamine 100 MG tablet       TAKE these medications    acetaminophen 325 MG tablet Commonly known as: TYLENOL Take 2 tablets (650 mg total) by mouth every 6 (six) hours as needed for mild pain (or Fever >/= 101).   albuterol (2.5 MG/3ML) 0.083% nebulizer solution Commonly known as: PROVENTIL Inhale 3 mLs into the lungs every 4 (four) hours as needed for wheezing or shortness of breath.   DULoxetine 60 MG capsule Commonly known as: CYMBALTA Take 60 mg by mouth daily.   pantoprazole 40 MG tablet Commonly known as: PROTONIX Take 1 tablet (40 mg total) by mouth daily.  Follow-up appointment    Discharge Condition:    poor  Delfin Gant, NP-C [facility] Personal contact information can be found on Amion  04/05/2021, 4:44 PM

## 2021-04-05 NOTE — Progress Notes (Signed)
This chaplain responded to the PMT consult for spiritual care.  The Pt. is awake and appreciative of the cheeseburger.  The Pt. shares he is waiting on his parents to join him with a milkshake.  The Pt. welcomed the chaplain's bedside presence.  The chaplain listened as the Pt. talked about death, his salvation, and his grandpa who was a Arts administrator.  The chaplain learned the Pt. enjoys telling stories and has a secret for remembering names.  The Pt. accepted the chaplain's Invitation for prayer and F/U spiritual care.

## 2021-04-05 NOTE — Progress Notes (Signed)
Pharmacy Antibiotic Note  Fernando Maddox is a 60 y.o. male form SNF admitted on 04/02/2021 with confusion and undifferentiated shock (septic + cardiogenic?).  Pharmacy has been consulted for cefepime dosing.    Renal function improved.  Est CrCl ~ 90 ml/min  Plan: Continue cefepime 2g q 8 hrs. Will f/u renal function, culture results, and clinical course   Height: 6\' 3"  (190.5 cm) Weight: 103.3 kg (227 lb 11.8 oz) IBW/kg (Calculated) : 84.5  Temp (24hrs), Avg:97.6 F (36.4 C), Min:97.2 F (36.2 C), Max:98.1 F (36.7 C)  Recent Labs  Lab 04/02/21 0346 04/02/21 0500 04/02/21 0631 04/02/21 0928 04/02/21 0947 04/02/21 1220 04/02/21 2002 04/03/21 0434 04/04/21 0414 04/05/21 0327  WBC  --   --   --  14.5*  --   --   --  12.4* 9.3 6.7  CREATININE  --    < >  --   --  1.81*  --  1.64* 1.56* 1.45* 1.16  LATICACIDVEN >11.0*  --  >11.0*  --  7.7* 5.5*  --   --   --   --    < > = values in this interval not displayed.     Estimated Creatinine Clearance: 89.2 mL/min (by C-G formula based on SCr of 1.16 mg/dL).    Allergies  Allergen Reactions   Codeine Itching and Other (See Comments)    "Allergic," per paperwork from facility   Bee Venom Other (See Comments)    "Feels like his head is on fire when he goes outside in the sun" and "Allergic," per paperwork from facility   Hydrocodone-Acetaminophen Itching and Other (See Comments)    "Allergic," per paperwork from facility   Cephalexin Other (See Comments)    No mention of this allergy in notes from 07/2016 admission for osteomyelitis.  Patient was treated with Ancef and Unasyn for broader anaerobic coverage.  Patient is not aware of allergy. -08/02/2020  On 04/02/2021: "Allergic," per paperwork from facility   Vicodin [Hydrocodone-Acetaminophen] Itching and Other (See Comments)    "Allergic," per paperwork from facility   Zocor [Simvastatin] Other (See Comments)    Myalgias and "Allergic," per paperwork from facility     Antimicrobials this admission: 6/7 Zosyn x 1 6/7 cefepime >>  6/7 vancomycin >>6/8 6/7 azithro for atypical cov >>   Dose adjustments this admission:  Microbiology results: 6/7 BCx: ngtd 6/7 MRSA PCR: -neg 6/7 COVID: neg  8/7, BCPS, BCCP Clinical Pharmacist  04/05/2021 7:32 AM   [facility] pharmacy phone numbers are listed on [email or web address]

## 2021-04-05 NOTE — Plan of Care (Signed)

## 2021-04-05 NOTE — TOC Transition Note (Signed)
Transition of Care [facility]) - CM/SW Discharge Note   Patient Details  Name: Fernando Maddox MRN: [id number] Date of Birth: 1960/11/02  Transition of Care [facility]) CM/SW Contact:  Terrial Rhodes, LCSWA Phone Number: 04/05/2021, 4:55 PM   Clinical Narrative:     Patient will DC to: [facility]  Anticipated DC date: 04/05/2021  Family notified: Rama  Transport by: Sharin Mons  ?  Per MD patient ready for DC to [facility]. RN, patient, patient's family, and facility notified of DC. RN given number for report tele#[phone]. DC packet on chart. DNR signed by MD attached to patients dc packet.Ambulance transport requested for patient.  CSW signing off.   Final next level of care: [facility]) Barriers to Discharge: No Barriers Identified   Patient Goals and CMS Choice   CMS Medicare.gov Compare Post Acute Care list provided to:: Patient Represenative (must comment) (patients mother Rama) Choice offered to / list presented to : Parent (patients mother Rama)  Discharge Placement              Patient chooses bed at:  [facility]) Patient to be transferred to facility by: [facility] Name of family member notified: Rama Patient and family notified of of transfer: 04/05/21  Discharge Plan and Services                                     Social Determinants of Health (SDOH) Interventions     Readmission Risk Interventions No flowsheet data found.

## 2021-04-05 NOTE — Progress Notes (Signed)
Progress Note  Patient Name: Fernando Maddox Date of Encounter: 04/05/2021  [facility] Cardiologist: Garwin Brothers, MD   Subjective   Alert today. Eating well. Denies SOB or chest pain.   Inpatient Medications    Scheduled Meds:  apixaban  5 mg Oral BID   Chlorhexidine Gluconate Cloth  6 each Topical Daily   digoxin  0.125 mg Oral Daily   feeding supplement  237 mL Oral TID BM   furosemide  60 mg Intravenous Q8H   insulin aspart  0-15 Units Subcutaneous TID WC   insulin glargine  10 Units Subcutaneous Daily   mouth rinse  15 mL Mouth Rinse BID   multivitamin with minerals  1 tablet Oral Daily   potassium chloride  40 mEq Oral Once   sodium chloride flush  10-40 mL Intracatheter Q12H   sodium chloride flush  3 mL Intravenous Q12H   Continuous Infusions:  sodium chloride     azithromycin 125 mL/hr at 04/05/21 0500   ceFEPime (MAXIPIME) IV Stopped (04/05/21 0401)   milrinone 0.375 mcg/kg/min (04/05/21 0500)   PRN Meds: sodium chloride, acetaminophen, docusate sodium, fentaNYL (SUBLIMAZE) injection, oxyCODONE, polyethylene glycol, sodium chloride flush, sodium chloride flush   Vital Signs    Vitals:   04/05/21 0318 04/05/21 0400 04/05/21 0500 04/05/21 0600  BP:  (!) 81/66 (!) 88/64 (!) 102/59  Pulse:  (!) 104 (!) 101 98  Resp:  16 12 10   Temp:  (!) 97.5 F (36.4 C)    TempSrc:  Axillary    SpO2:  94% 93% 94%  Weight: 103.3 kg     Height:        Intake/Output Summary (Last 24 hours) at 04/05/2021 0755 Last data filed at 04/05/2021 06/05/2021 Gross per 24 hour  Intake 2786.98 ml  Output 5685 ml  Net -2898.02 ml    Last 3 Weights 04/05/2021 04/04/2021 04/03/2021  Weight (lbs) 227 lb 11.8 oz 242 lb 11.6 oz 259 lb 0.7 oz  Weight (kg) 103.3 kg 110.1 kg 117.5 kg      Telemetry    Afib with controlled rate 100-110  - Personally Reviewed  ECG    None today - Personally Reviewed  Physical Exam   GEN: No acute distress.   Neck: +JVD, central line in place. CCVP  19 mm Hg Cardiac: IRRR, no murmurs, rubs, or gallops.  Respiratory: Clear to auscultation bilaterally. GI: Soft, nontender, non-distended  Ext: 3+ diffuse edema/anasarca Neuro:  Nonfocal  Psych: Normal affect   Labs    High Sensitivity Troponin:   Recent Labs  Lab 03/15/21 0557 03/15/21 0730 04/03/21 1028  TROPONINIHS 27* 25* 28*       Chemistry Recent Labs  Lab 04/03/21 0434 04/04/21 0414 04/05/21 0327  NA 133* 134* 129*  K 3.8 3.5 3.7  CL 93* 86* 82*  CO2 29 39* 40*  GLUCOSE 172* 149* 137*  BUN 43* 35* 31*  CREATININE 1.56* 1.45* 1.16  CALCIUM 8.1* 7.9* 8.0*  PROT 5.5* 5.8* 5.9*  ALBUMIN 2.2* 2.3* 2.3*  AST 297* 162* 90*  ALT 142* 124* 100*  ALKPHOS 78 108 113  BILITOT 3.5* 2.8* 2.2*  GFRNONAA 51* 56* >60  ANIONGAP 11 9 7       Hematology Recent Labs  Lab 04/03/21 0434 04/04/21 0414 04/05/21 0327  WBC 12.4* 9.3 6.7  RBC 3.36* 3.39* 3.38*  HGB 9.2* 9.2* 9.0*  HCT 29.6* 29.6* 29.0*  MCV 88.1 87.3 85.8  MCH 27.4 27.1 26.6  MCHC  31.1 31.1 31.0  RDW 19.3* 19.1* 18.7*  PLT 269 261 223     BNP Recent Labs  Lab 04/02/21 0346  BNP 1,993.9*      DDimer No results for input(s): DDIMER in the last 168 hours.   Radiology    No results found.   Cardiac Studies   Limited echo 04/02/2021 1. Left ventricular ejection fraction, by estimation, is 20 to 25%. The  left ventricle has severely decreased function. The left ventricle  demonstrates regional wall motion abnormalities (see scoring  diagram/findings for description). The left  ventricular internal cavity size was mildly to moderately dilated. Left  ventricular diastolic function could not be evaluated. There is the  interventricular septum is flattened in systole and diastole, consistent  with right ventricular pressure and  volume overload. There is severe global hypokinesis with relative  preservation of the basal and mid segments of the lateral wall.   2. Right ventricular systolic  function is severely reduced. The right  ventricular size is severely enlarged. There is moderately elevated  pulmonary artery systolic pressure. The estimated right ventricular  systolic pressure is 46.8 mmHg.   3. Left atrial size was moderately dilated.   4. Right atrial size was moderately dilated.   5. The pericardial effusion is circumferential.   6. The mitral valve is normal in structure. Moderate to severe mitral  valve regurgitation. No evidence of mitral stenosis.   7. Tricuspid valve regurgitation is moderate to severe.   8. The aortic valve is tricuspid. There is mild calcification of the  aortic valve. Aortic valve regurgitation is not visualized. Mild aortic  valve sclerosis is present, with no evidence of aortic valve stenosis.   9. The inferior vena cava is dilated in size with <50% respiratory  variability, suggesting right atrial pressure of 15 mmHg.   Comparison(s): Prior images reviewed side by side. Mitral insufficiency  was underestimated on the previous study, when it was also probably  moderate-severe. Tricuspid insufficiency has worsened on the current  study.     Patient Profile     60 y.o. male with a hx of CAD, chronic atrial fibrillation, history of CVA, OSA on CPAP, HTN, HLD, DM2, tobacco abuse, combined systolic and diastolic heart failure, recent left BKA secondary to osteomyelitis, and history of polysubstance abuse who is being seen 04/02/2021 for the evaluation of cardiogenic vs septic shock at the request of Dr. Everardo All.  Assessment & Plan    Acute septic shock. On broad spectrum antibiotics per CCM. BC negative to date. Severely acidotic. Acidosis improving with lactate 11>>5.5. Potential sources include PNA or urinary source. Seen by Ortho and stump looks good.  Acute on chronic systolic CHF with cardiogenic shock. EF 20-25% with biventricular failure. CVP initially elevated to 23 mm Hg. Patient has diffuse anasarca with initial Co- ox better than  anticipated at 74 initially since he had concomitant sepsis. now 49% on milrinone. BNP elevated 1,994. Now on IV milrinone- dose increased yesterdy. BP soft but stable. Afib rate is controlled. Diruesing very well with IV lasix - I/O negative 11.1 liters since admit. Negative 2.9 liters yesterday on tid lasix. CVP still elevated 19 mm Hg.  Weight coming down but still grossly volume overloaded. Renal function improving. Will continue to hold beta blocker for now. Added digoxin for rate control and CHF.  Continue current therapy Acute kidney injury. Creatinine 0>9>> 1.8. now improved to 1.16  CAD. Per history. ? Prior stent. No acute ischemic changes on Ecg -  troponin negative.  S/p left BKA  OSA on CPAP in past  HLD.  Afib chronic rate currently controlled. On chronic anticoagulation. Auto anticoagulated with sepsis and acute hepatic dysfunction. Plan to resume oral anticoagulation once INR < 2. Plan to start Eliquis today.  Elevated LFTs due to sepsis and cardiogenic shock. Improving.     The patient is critically ill with multiple organ systems failure and requires high complexity decision making for assessment and support, frequent evaluation and titration of therapies, application of advanced monitoring technologies and extensive interpretation of multiple databases.   For questions or updates, please contact [facility] Please consult [email or web address] for contact info under        Signed, Carolle Ishii [place], MD  04/05/2021, 7:55 AM

## 2021-04-05 NOTE — Plan of Care (Signed)
  Problem: Clinical Measurements: Goal: Will remain free from infection Outcome: Progressing   Problem: Nutrition: Goal: Adequate nutrition will be maintained Outcome: Progressing   

## 2021-04-05 NOTE — Progress Notes (Signed)
Palliative spoke with patient and Mother together.   Family opted for residential facility in [place].   Voice mail left for [facility].      Please continue antibiotics and milrinone until discharge.  Full note to follow.  Norvel Richards, PA-C Palliative Medicine Office:  [phone]

## 2021-04-05 NOTE — Progress Notes (Signed)
Daily Progress Note   Patient Name: Fernando Maddox       Date: 04/05/2021 DOB: 06-28-1961  Age: 60 y.o. MRN#: [id number] Attending Physician: Garner Nash, DO Primary Care Physician: Jodi Marble, MD Admit Date: 04/02/2021  Reason for Consultation/Follow-up: Hospice discussion and to discuss complex medical decision making related to patient's goals of care  Chart reviewed, briefly discussed with Dr. Valeta Harms.     Subjective: Fernando Isaacs, PA-S2 and I met with the patient at bedside.  We spoke to him for a bit about his understanding of his current condition.  He understands that his heart is very weak and tells [place] he wants to go home.   I asked who cares for him at home and he explains that his mother and father are the only folks at home.  At this point I added his mother on by speaker phone to talk with [place].   Together with his mother we discussed his very serious heart condition.   His mother was somewhat surprised by this information as the patient has not been keeping his parents up to date.  We gently discussed his very limited prognosis.  We talked about discharge options being home with hospice vs a hospice facility.   (Hospice at his previous SNF is not yet an option that insurance will cover).  After some conversation the family felt that the utilization of 24 hour nursing care may be the best way to go given that he is likely to have complex symptom burden and a short prognosis.  After our phone call we talked with Ashad a bit longer.  We asked what we could do for him.  He replied that he wanted a cheese burger and that he wanted to feel full (It's been a long time since I felt full).  We brought him a double cheeseburger and fries for lunch.   Assessment: Patient with biventricular  heart failure, admitted with multilobar PNA just 3 days ago.  Dx with cardiogenic shock and FTT.  Currently being maintained on milrinone.   Patient Profile/HPI:  60 y.o. male  admitted on 04/02/2021  with history of CAD, chronic Afib, chronic systolic CHF presents with failure to thrive, confusion, and anorexia. 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 Has severe  CHF with biventricular failure. EF 20-25%. On recent admission required midodrine for hypotension. Was treated with diuretics and Toprol XL for rate control. Has been on chronic Xarelto. There has been concern about patient compliance in the past but he has been in a SNF since last discharge. He has a standing DNR order.     On Admission patient noted to be severely acidotic and has diffuse anasarca. CXR concerning for RLL PNA. He has worsening renal function. Anemic with Hgb 9.7. Repeat Echo showed EF 20-25% with biventricular failure and mod to severe MR.  Currently admitted to cardiac intensive care. Palliative medicine consulted to help with establishing goals of care and emotional support.      Length of Stay: 3   Vital Signs: BP 94/65   Pulse (!) 109   Temp (!) 97.5 F (36.4 C) (Oral)   Resp 12   Ht _0  (1.905 m)   Wt 103.3 kg   SpO2 93%   BMI 28.46 kg/m  SpO2: SpO2: 93 % O2 Device: O2 Device: Nasal Cannula O2 Flow Rate: O2 Flow Rate (L/min): 2 L/min       Palliative Assessment/Data:  30%     Palliative Care Plan    Recommendations/Plan: Shift to a goal of comfort Plan for discharge to [facility] in [place] as soon as a bed is available. Please continue milrinone and antibiotics until discharge so the patient will have time with his family.  Code Status:  DNR  Prognosis:  < 2 weeks once milrinone is discontinued.   Discharge Planning: Hospice facility  Care plan was discussed with patient, mother, RN,  medical team, TOC, and Hospice Liasion.  Thank you for allowing the Palliative Medicine Team to assist in the care of this patient.  Total time spent:  45 min.     Greater than 50%  of this time was spent counseling and coordinating care related to the above assessment and plan.  Florentina Jenny, PA-C Palliative Medicine  Please contact Palliative MedicineTeam phone at [phone] for questions and concerns between 7 am - 7 pm.   Please see AMION for individual provider pager numbers.

## 2021-04-06 ENCOUNTER — Other Ambulatory Visit: Payer: Self-pay

## 2021-04-07 LAB — CULTURE, BLOOD (ROUTINE X 2)
Culture: NO GROWTH
Culture: NO GROWTH
Special Requests: ADEQUATE

## 2021-04-08 ENCOUNTER — Ambulatory Visit: Payer: Medicare Other | Admitting: Cardiology

## 2021-04-26 DEATH — deceased

## 2022-01-29 IMAGING — DX DG CHEST 1V PORT
1 series · 1 of 1 positions shown · non-contrast
Comparison: Chest radiograph dated 02/26/2021.

CLINICAL DATA: Shortness of breath

EXAM:
PORTABLE CHEST 1 VIEW

[chest ap]
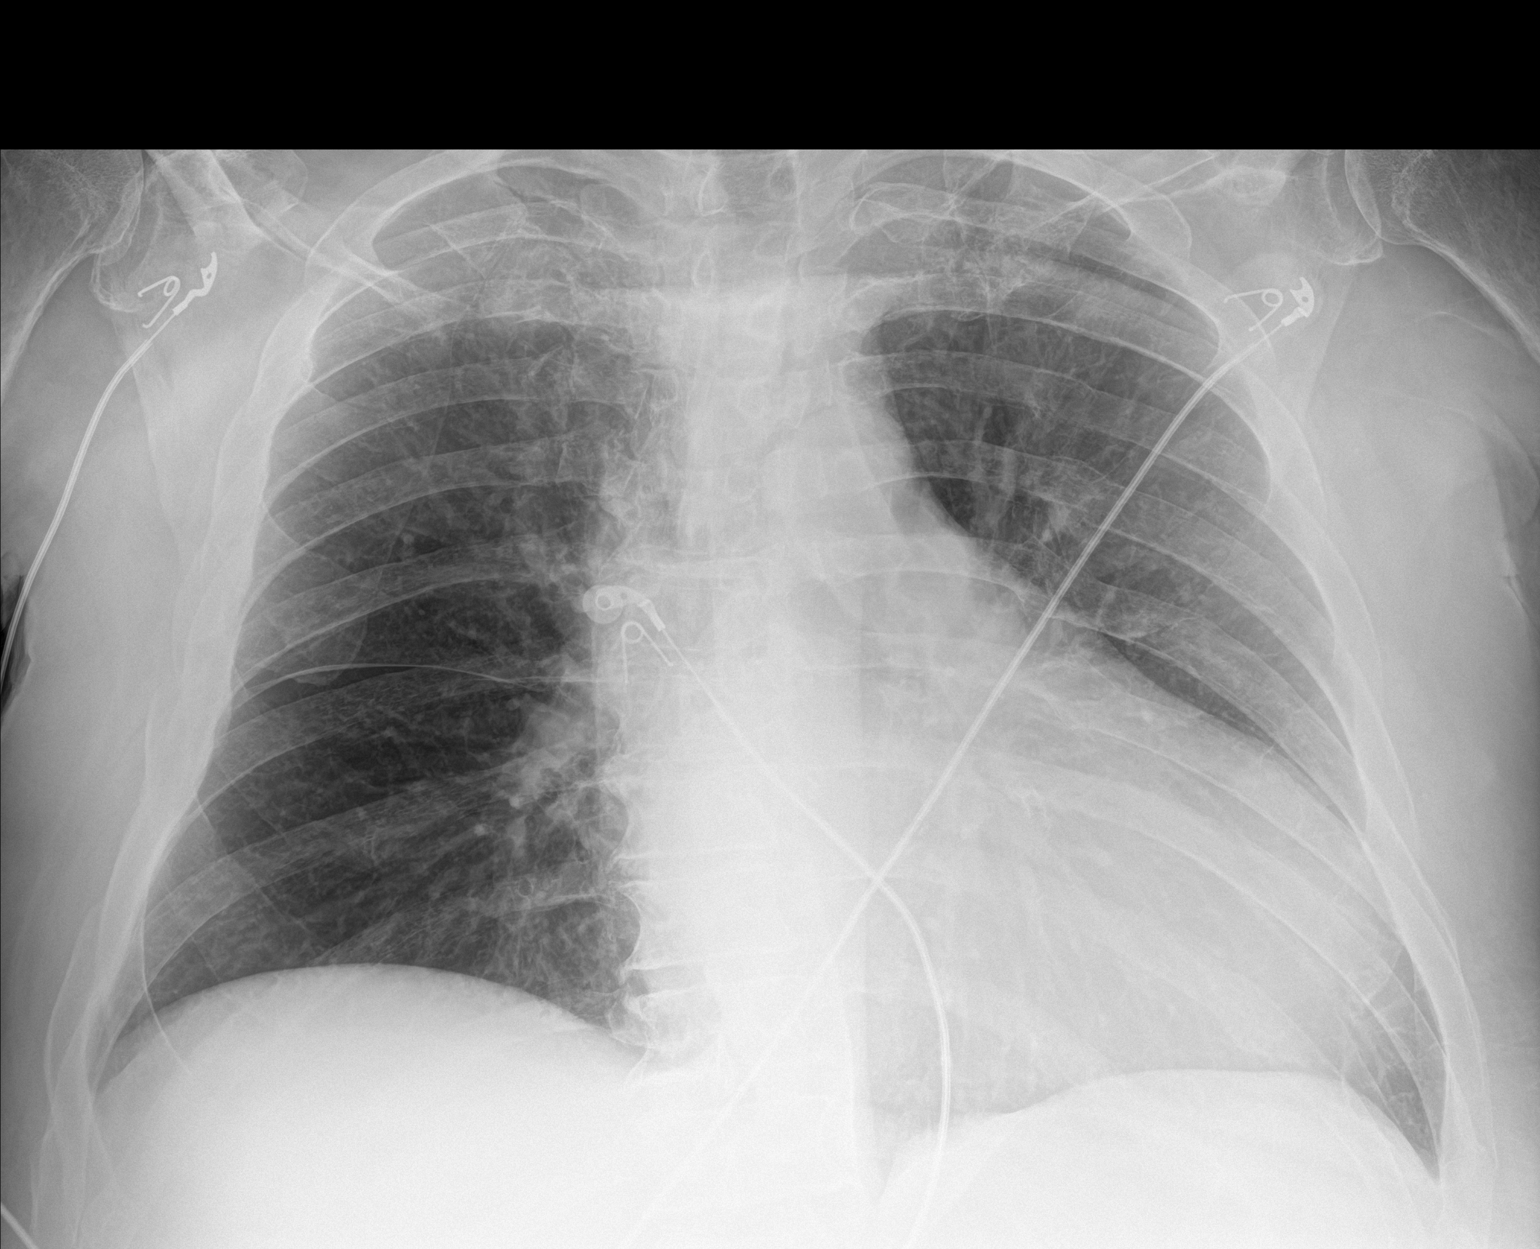

[1 of 1 positions shown; findings below may reference images not displayed]

FINDINGS: The heart is enlarged. The lungs are clear. Degenerative changes are
seen in the spine. Chronic right-sided rib fractures and a chronic
left clavicle deformity are redemonstrated.
IMPRESSION: No active disease.  Cardiomegaly.

## 2022-02-10 IMAGING — CT CT HEAD W/O CM
4 series · 17 of 47 positions shown, 19 images · non-contrast
Comparison: CT 02/14/2021 large

CLINICAL DATA: Mental status change.

EXAM:
CT HEAD WITHOUT CONTRAST
TECHNIQUE: Contiguous axial images were obtained from the base of the skull
through the vertex without intravenous contrast.

[Series 2: head wo · axial · 0.44mm/px · z∈[-95,+30]mm · 7 of 35 slices shown, 9 images]
[im 5/35  brain]
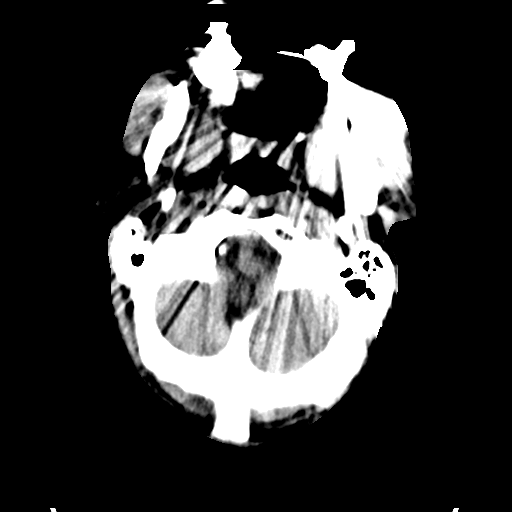
[im 5/35  bone]
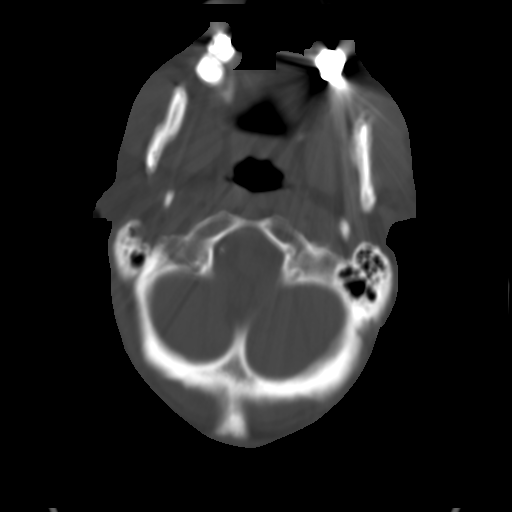
[im 9/35  brain]
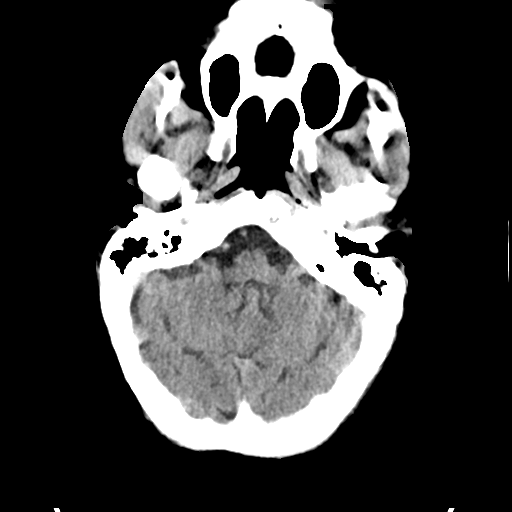
[im 13/35  brain]
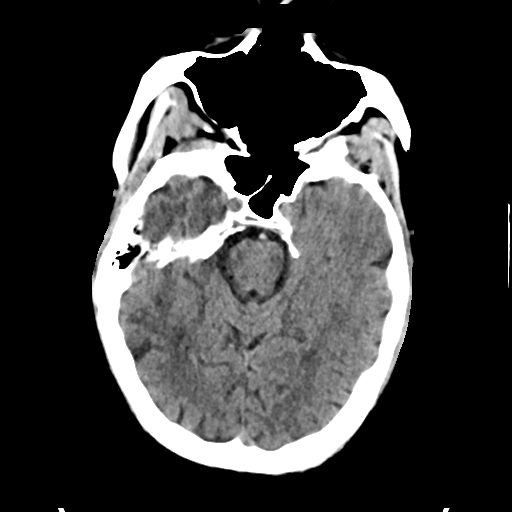
[im 18/35  brain]
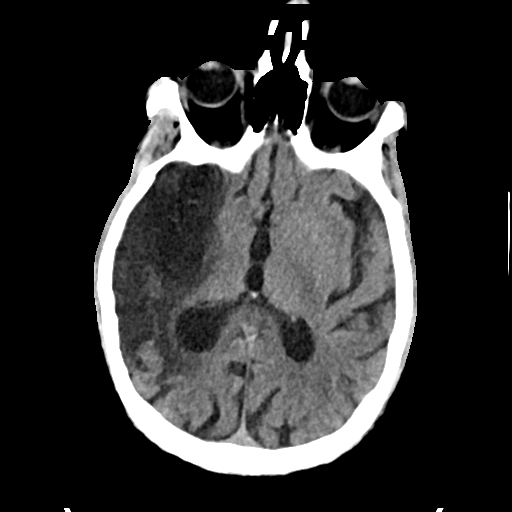
[im 22/35  brain]
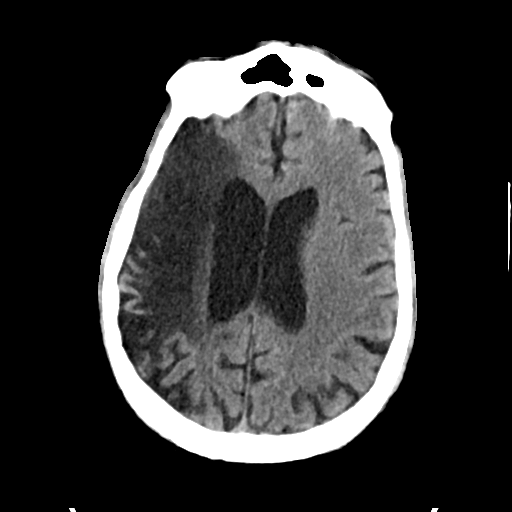
[im 22/35  bone]
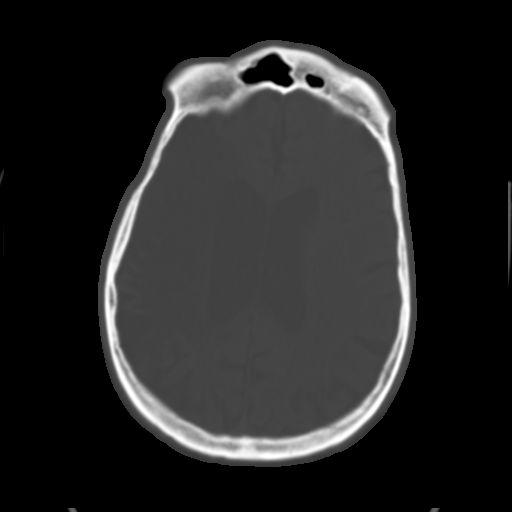
[im 26/35  brain]
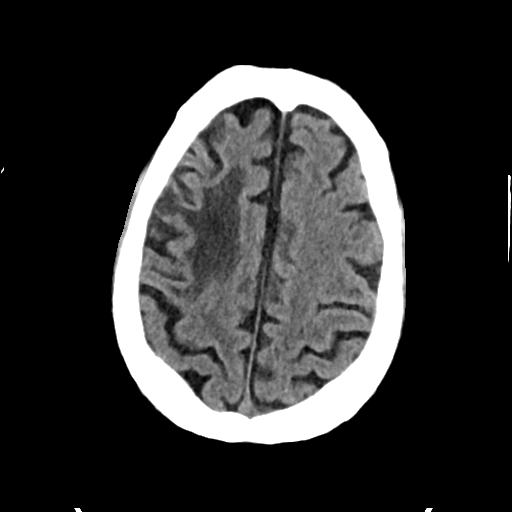
[im 30/35  brain]
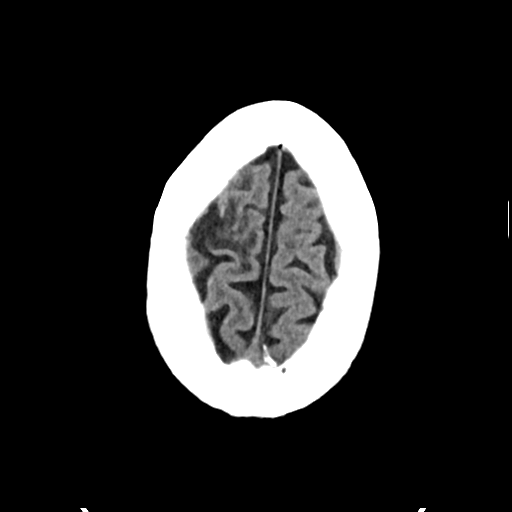

[Series 3: head bone · axial · 0.44mm/px · z∈[-99,-37]mm · 4 of 88 slices shown]
[im 9/88  bone]
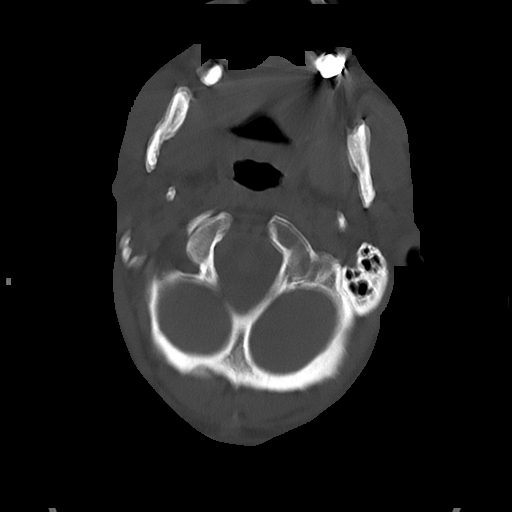
[im 18/88  bone]
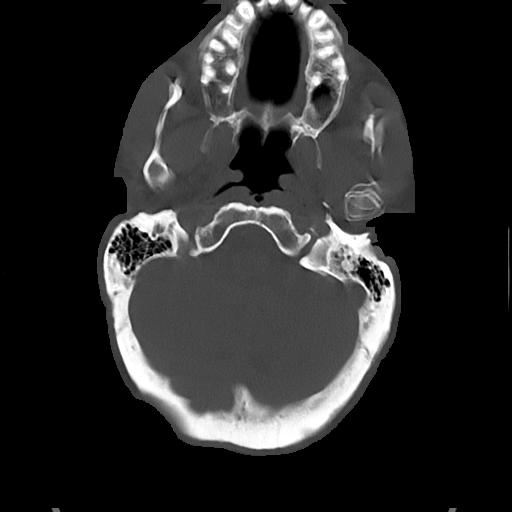
[im 27/88  bone]
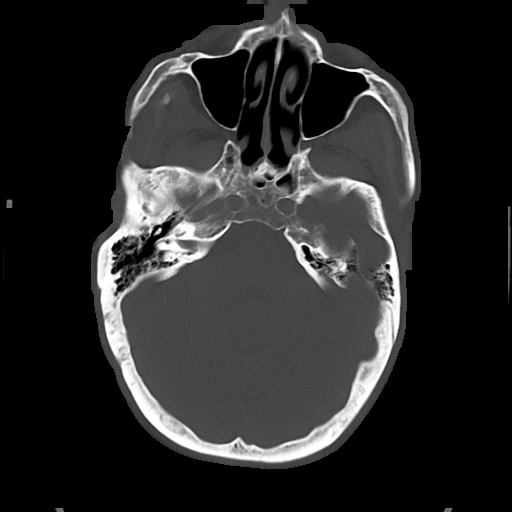
[im 40/88  bone]
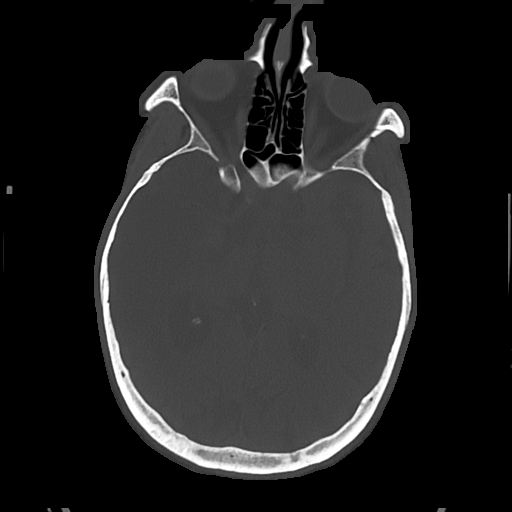

[Series 4: cor soft · coronal · 0.35mm/px · 3 of 77 slices shown]
[im 28/77  brain]
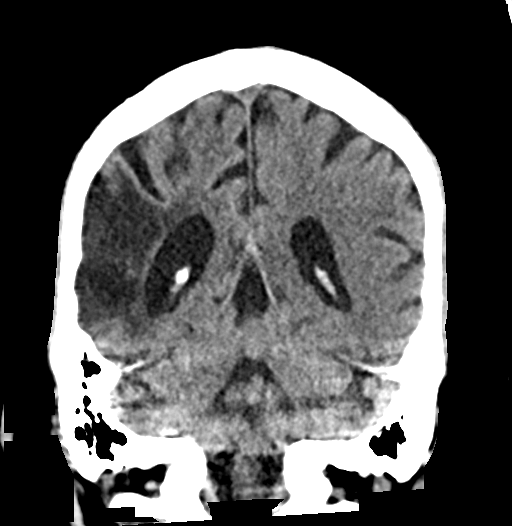
[im 35/77  brain]
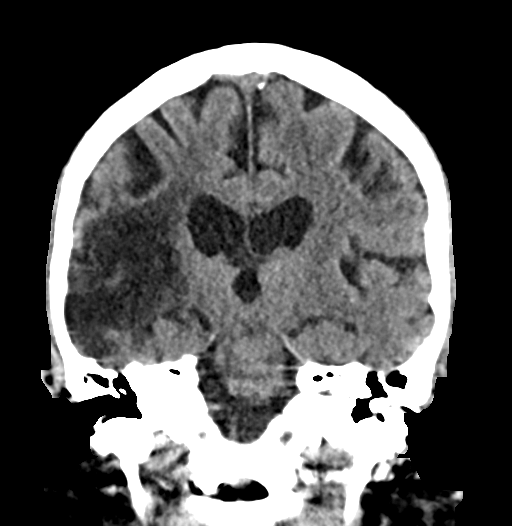
[im 42/77  brain]
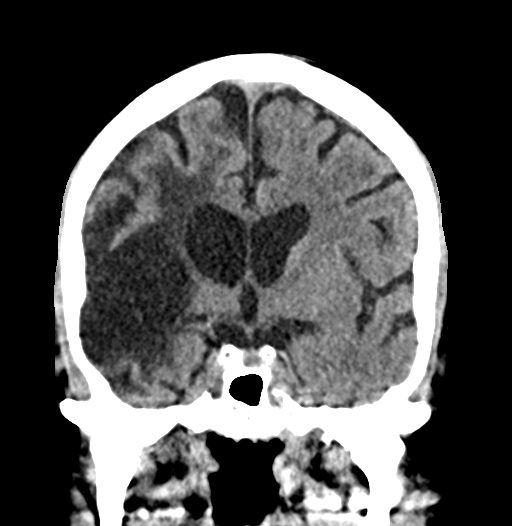

[Series 5: sag soft · sagittal · 0.36mm/px · 3 of 61 slices shown]
[im 11/61  brain]
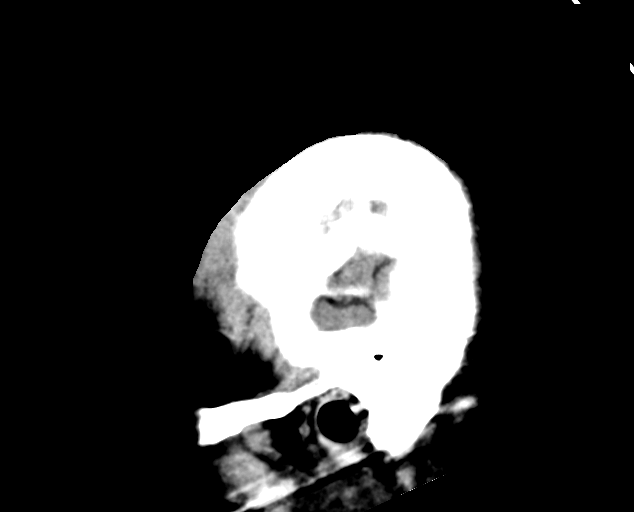
[im 21/61  brain]
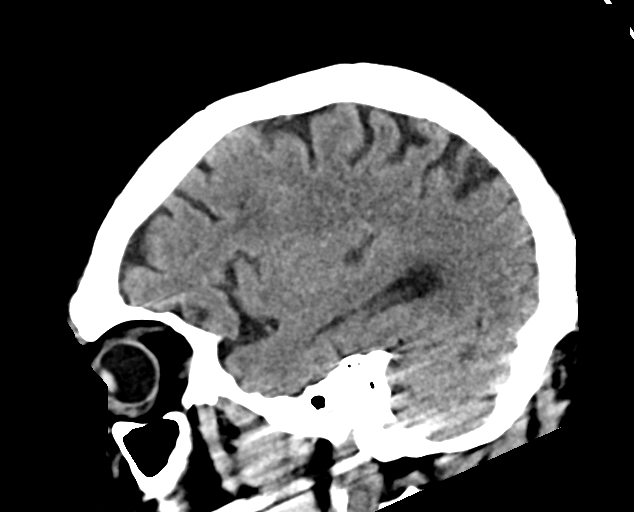
[im 31/61  brain]
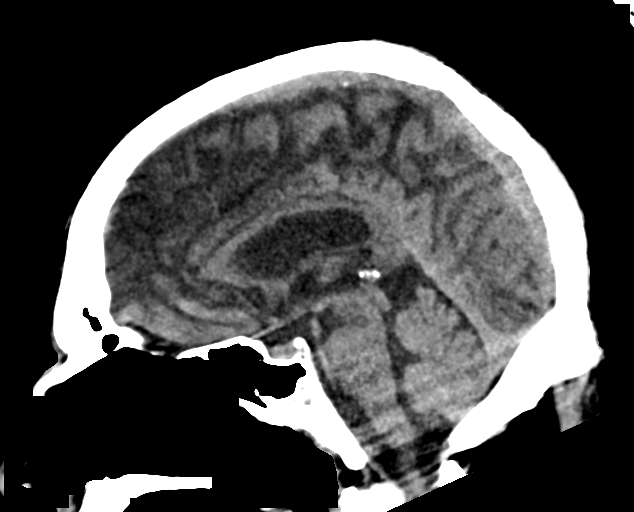

[17 of 47 positions shown; findings below may reference images not displayed]

FINDINGS: Brain: Large RIGHT MCA territory infarction again noted. No evidence
of acute new subcortical hypodensity. No extra-axial fluid
collections or intracranial hemorrhage. No intraventricular
hemorrhage. No midline shift.

Vascular: No hyperdense vessel or unexpected calcification.

Skull: Normal. Negative for fracture or focal lesion.

Sinuses/Orbits: Paranasal sinuses and mastoid air cells are clear.
Orbits are clear.

Other: None.
IMPRESSION: 1. Large RIGHT MCA territory infarction again noted.
2. No acute intracranial hemorrhage.
3. No new infarction.

## 2022-02-10 IMAGING — DX DG TIBIA/FIBULA 2V*L*
1 series · 3 of 3 positions shown · non-contrast
Comparison: None.

CLINICAL DATA: Status post recent BKA with warm skin.

EXAM:
LEFT TIBIA AND FIBULA - 2 VIEW

[Series 1: leg · 0.14mm/px · 3 of 3 slices shown]
[im 1/3]
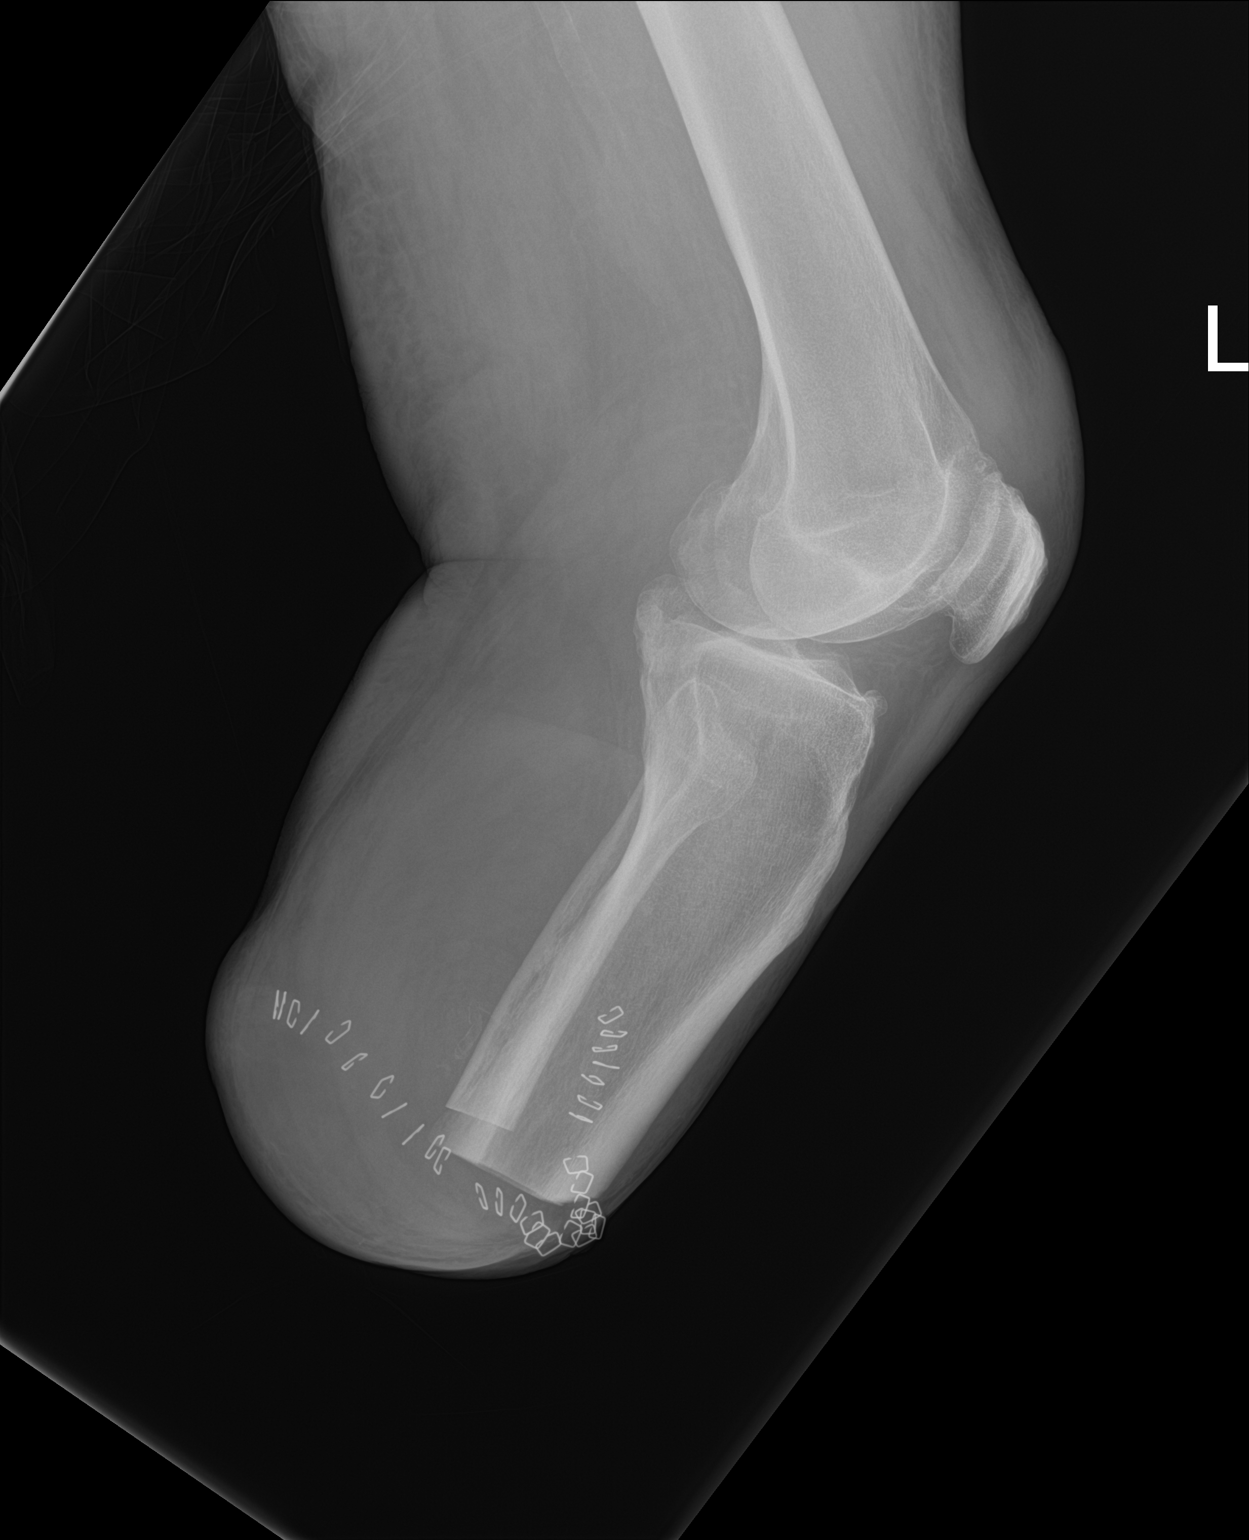
[im 2/3]
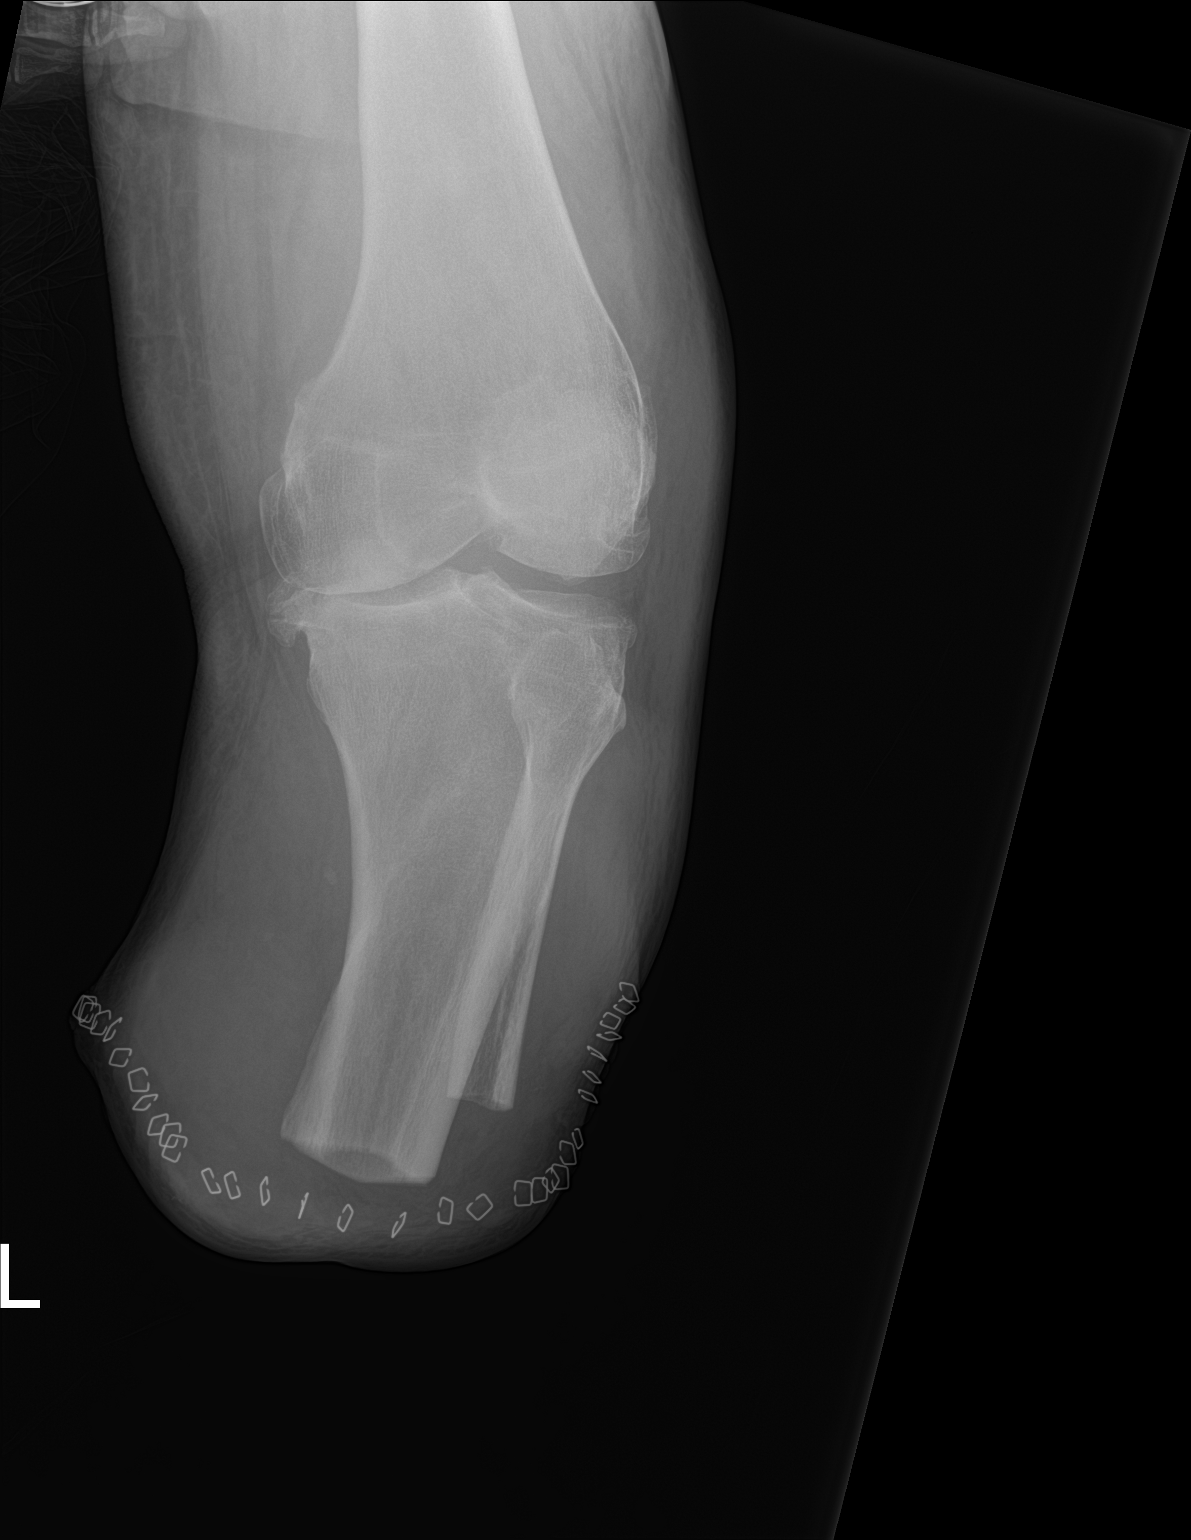
[im 3/3]
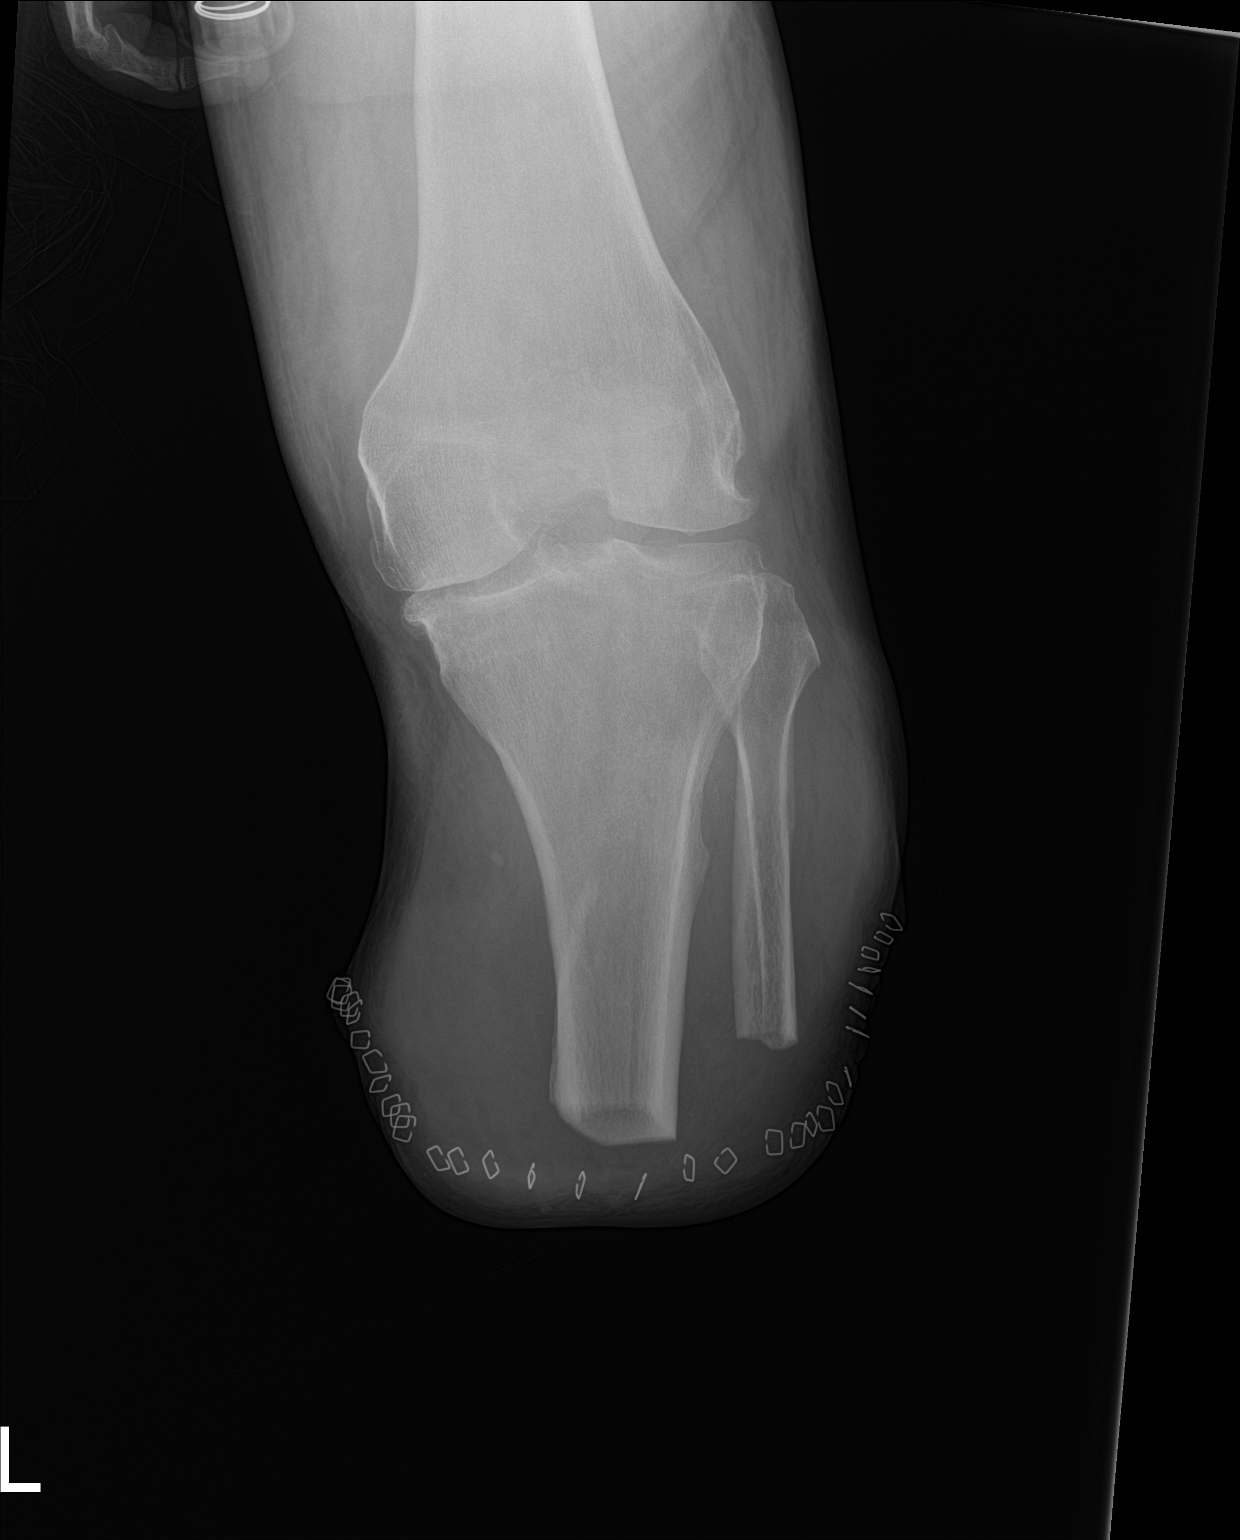

[3 of 3 positions shown; findings below may reference images not displayed]

FINDINGS: Three views study shows degenerative changes at the knee in this
patient status post below the knee amputation. No gas in the soft
tissues of the amputation stump. No bony destruction or lysis to
suggest osteomyelitis.
IMPRESSION: 1. Status post below the knee amputation. No complicating features
by x-ray.
2. Degenerative changes at the knee.

## 2022-02-11 IMAGING — DX DG CHEST 1V PORT
1 series · 1 of 1 positions shown · non-contrast
Comparison: March 15, 2021

CLINICAL DATA: Shortness of breath

EXAM:
PORTABLE CHEST 1 VIEW

[chest]
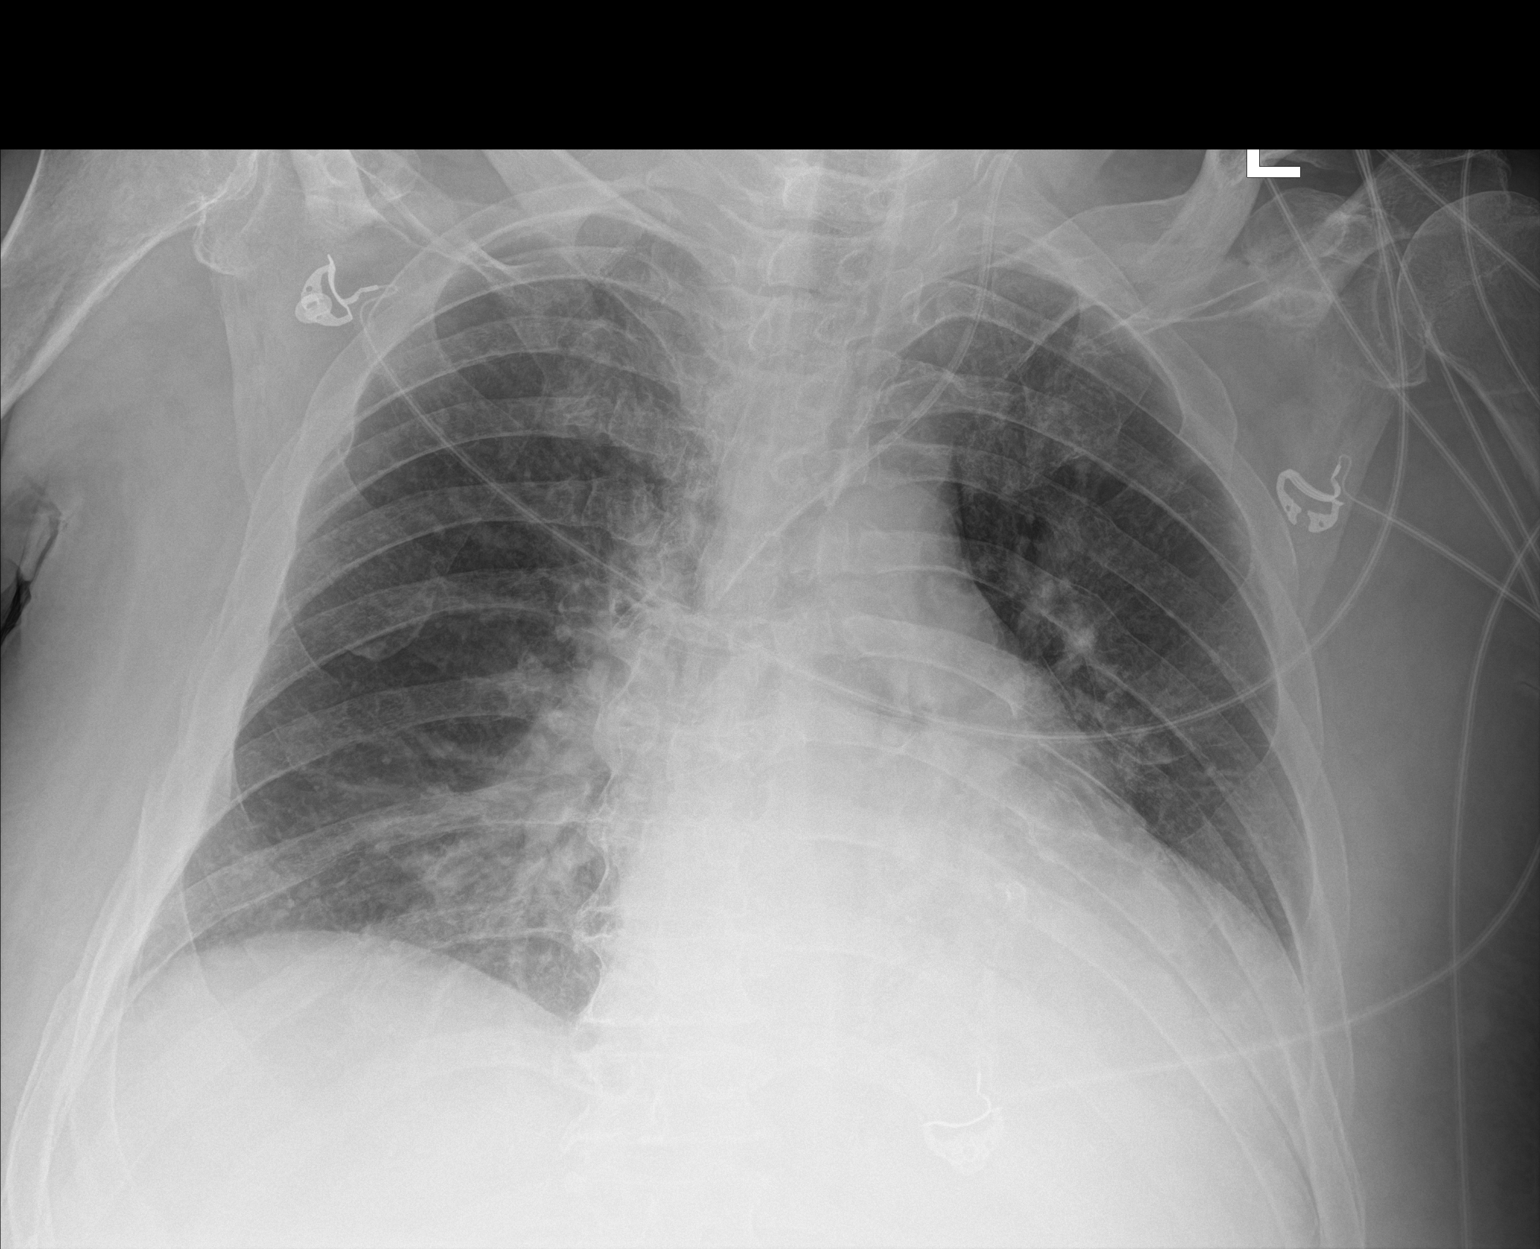

[1 of 1 positions shown; findings below may reference images not displayed]

FINDINGS: The left central line terminates in the SVC, stable. No
pneumothorax. The cardiomediastinal silhouette is stable. Probable
atelectasis in the left retrocardiac region. No other acute
abnormalities or changes.
IMPRESSION: Probable atelectasis in the left retrocardiac region. Stable support
apparatus. No other acute abnormalities.
